# Patient Record
Sex: Male | Born: 1955 | Race: White | Hispanic: No | Marital: Married | Smoking: Never smoker
Health system: Southern US, Community
[De-identification: ages and names within clinical notes are randomized; demographics above are authoritative.]

## PROBLEM LIST (undated history)

## (undated) DIAGNOSIS — I82609 Acute embolism and thrombosis of unspecified veins of unspecified upper extremity: Secondary | ICD-10-CM

## (undated) DIAGNOSIS — R079 Chest pain, unspecified: Secondary | ICD-10-CM

## (undated) DIAGNOSIS — E349 Endocrine disorder, unspecified: Secondary | ICD-10-CM

## (undated) DIAGNOSIS — N529 Male erectile dysfunction, unspecified: Secondary | ICD-10-CM

## (undated) DIAGNOSIS — E669 Obesity, unspecified: Principal | ICD-10-CM

## (undated) DIAGNOSIS — D6859 Other primary thrombophilia: Secondary | ICD-10-CM

## (undated) DIAGNOSIS — Z9889 Other specified postprocedural states: Secondary | ICD-10-CM

## (undated) DIAGNOSIS — M545 Low back pain, unspecified: Secondary | ICD-10-CM

## (undated) DIAGNOSIS — G4733 Obstructive sleep apnea (adult) (pediatric): Secondary | ICD-10-CM

## (undated) DIAGNOSIS — E1169 Type 2 diabetes mellitus with other specified complication: Secondary | ICD-10-CM

## (undated) DIAGNOSIS — I2699 Other pulmonary embolism without acute cor pulmonale: Secondary | ICD-10-CM

## (undated) DIAGNOSIS — K219 Gastro-esophageal reflux disease without esophagitis: Secondary | ICD-10-CM

## (undated) HISTORY — DX: Other primary thrombophilia: D68.59

## (undated) HISTORY — DX: Other specified postprocedural states: Z98.890

## (undated) HISTORY — PX: OTHER SURGICAL HISTORY: SHX169

## (undated) HISTORY — PX: ESOPHAGOGASTRODUODENOSCOPY: SHX1529

## (undated) HISTORY — DX: Gastro-esophageal reflux disease without esophagitis: K21.9

## (undated) HISTORY — DX: Low back pain, unspecified: M54.50

## (undated) HISTORY — DX: Endocrine disorder, unspecified: E34.9

## (undated) HISTORY — PX: KNEE SURGERY: SHX244

## (undated) HISTORY — DX: Acute embolism and thrombosis of unspecified veins of unspecified upper extremity: I82.609

## (undated) HISTORY — DX: Male erectile dysfunction, unspecified: N52.9

## (undated) HISTORY — DX: Type 2 diabetes mellitus with other specified complication: E11.69

## (undated) HISTORY — DX: Low back pain: M54.5

## (undated) HISTORY — PX: APPENDECTOMY: SHX54

## (undated) HISTORY — DX: Other pulmonary embolism without acute cor pulmonale: I26.99

## (undated) HISTORY — PX: SHOULDER SURGERY: SHX246

## (undated) HISTORY — DX: Obesity, unspecified: E66.9

## (undated) HISTORY — DX: Obstructive sleep apnea (adult) (pediatric): G47.33

---

## 2004-01-17 ENCOUNTER — Encounter: Admission: RE | Admit: 2004-01-17 | Discharge: 2004-01-17 | Payer: Self-pay | Admitting: Orthopedic Surgery

## 2004-07-31 ENCOUNTER — Encounter: Admission: RE | Admit: 2004-07-31 | Discharge: 2004-07-31 | Payer: Self-pay | Admitting: Orthopedic Surgery

## 2004-08-17 ENCOUNTER — Ambulatory Visit (HOSPITAL_COMMUNITY): Admission: RE | Admit: 2004-08-17 | Discharge: 2004-08-18 | Payer: Self-pay | Admitting: Orthopedic Surgery

## 2004-11-23 ENCOUNTER — Encounter (INDEPENDENT_AMBULATORY_CARE_PROVIDER_SITE_OTHER): Payer: Self-pay | Admitting: *Deleted

## 2004-11-23 ENCOUNTER — Ambulatory Visit (HOSPITAL_COMMUNITY): Admission: RE | Admit: 2004-11-23 | Discharge: 2004-11-23 | Payer: Self-pay | Admitting: Gastroenterology

## 2005-02-26 ENCOUNTER — Encounter: Admission: RE | Admit: 2005-02-26 | Discharge: 2005-02-26 | Payer: Self-pay

## 2008-01-06 ENCOUNTER — Encounter: Admission: RE | Admit: 2008-01-06 | Discharge: 2008-01-06 | Payer: Self-pay | Admitting: Sports Medicine

## 2008-01-21 ENCOUNTER — Encounter: Admission: RE | Admit: 2008-01-21 | Discharge: 2008-01-21 | Payer: Self-pay | Admitting: Sports Medicine

## 2008-02-04 ENCOUNTER — Encounter: Admission: RE | Admit: 2008-02-04 | Discharge: 2008-02-04 | Payer: Self-pay | Admitting: Sports Medicine

## 2008-05-21 ENCOUNTER — Encounter: Payer: Self-pay | Admitting: Pulmonary Disease

## 2008-06-09 ENCOUNTER — Encounter: Payer: Self-pay | Admitting: Pulmonary Disease

## 2008-09-25 ENCOUNTER — Encounter: Admission: RE | Admit: 2008-09-25 | Discharge: 2008-09-25 | Payer: Self-pay | Admitting: Sports Medicine

## 2008-10-13 ENCOUNTER — Encounter: Admission: RE | Admit: 2008-10-13 | Discharge: 2008-10-13 | Payer: Self-pay | Admitting: Sports Medicine

## 2009-01-06 ENCOUNTER — Encounter: Admission: RE | Admit: 2009-01-06 | Discharge: 2009-01-06 | Payer: Self-pay | Admitting: Sports Medicine

## 2009-03-30 ENCOUNTER — Encounter: Admission: RE | Admit: 2009-03-30 | Discharge: 2009-03-30 | Payer: Self-pay | Admitting: Sports Medicine

## 2009-05-14 ENCOUNTER — Encounter: Admission: RE | Admit: 2009-05-14 | Discharge: 2009-05-14 | Payer: Self-pay | Admitting: Sports Medicine

## 2009-06-18 ENCOUNTER — Encounter: Admission: RE | Admit: 2009-06-18 | Discharge: 2009-06-18 | Payer: Self-pay | Admitting: Sports Medicine

## 2009-08-19 HISTORY — PX: COLONOSCOPY: SHX174

## 2009-08-19 LAB — HM COLONOSCOPY: HM Colonoscopy: NORMAL

## 2009-10-13 ENCOUNTER — Encounter: Admission: RE | Admit: 2009-10-13 | Discharge: 2009-10-13 | Payer: Self-pay | Admitting: Sports Medicine

## 2009-11-23 ENCOUNTER — Encounter: Admission: RE | Admit: 2009-11-23 | Discharge: 2009-11-23 | Payer: Self-pay | Admitting: Sports Medicine

## 2010-02-11 ENCOUNTER — Encounter: Admission: RE | Admit: 2010-02-11 | Discharge: 2010-02-11 | Payer: Self-pay | Admitting: Family Medicine

## 2010-02-22 DIAGNOSIS — I2699 Other pulmonary embolism without acute cor pulmonale: Secondary | ICD-10-CM

## 2010-02-22 HISTORY — DX: Other pulmonary embolism without acute cor pulmonale: I26.99

## 2010-02-23 ENCOUNTER — Ambulatory Visit: Payer: Self-pay | Admitting: Family Medicine

## 2010-02-23 DIAGNOSIS — E291 Testicular hypofunction: Secondary | ICD-10-CM | POA: Insufficient documentation

## 2010-02-23 DIAGNOSIS — N529 Male erectile dysfunction, unspecified: Secondary | ICD-10-CM | POA: Insufficient documentation

## 2010-02-23 DIAGNOSIS — K219 Gastro-esophageal reflux disease without esophagitis: Secondary | ICD-10-CM | POA: Insufficient documentation

## 2010-02-23 DIAGNOSIS — G4730 Sleep apnea, unspecified: Secondary | ICD-10-CM | POA: Insufficient documentation

## 2010-02-23 DIAGNOSIS — S300XXA Contusion of lower back and pelvis, initial encounter: Secondary | ICD-10-CM | POA: Insufficient documentation

## 2010-03-21 ENCOUNTER — Encounter: Admission: RE | Admit: 2010-03-21 | Discharge: 2010-03-21 | Payer: Self-pay | Admitting: Sports Medicine

## 2010-03-23 ENCOUNTER — Encounter: Admission: RE | Admit: 2010-03-23 | Discharge: 2010-03-23 | Payer: Self-pay | Admitting: Neurosurgery

## 2010-03-23 ENCOUNTER — Encounter: Payer: Self-pay | Admitting: Family Medicine

## 2010-04-26 ENCOUNTER — Encounter: Payer: Self-pay | Admitting: Family Medicine

## 2010-05-10 ENCOUNTER — Ambulatory Visit: Payer: Self-pay | Admitting: Family Medicine

## 2010-05-10 DIAGNOSIS — M545 Low back pain, unspecified: Secondary | ICD-10-CM | POA: Insufficient documentation

## 2010-05-10 DIAGNOSIS — R609 Edema, unspecified: Secondary | ICD-10-CM | POA: Insufficient documentation

## 2010-05-11 ENCOUNTER — Encounter: Payer: Self-pay | Admitting: Family Medicine

## 2010-05-12 LAB — CONVERTED CEMR LAB
ALT: 27 units/L (ref 0–53)
AST: 35 units/L (ref 0–37)
Albumin: 3.9 g/dL (ref 3.5–5.2)
Alkaline Phosphatase: 68 units/L (ref 39–117)
BUN: 13 mg/dL (ref 6–23)
Basophils Absolute: 0.1 10*3/uL (ref 0.0–0.1)
Basophils Relative: 1 % (ref 0–1)
Bilirubin, Direct: 0.1 mg/dL (ref 0.0–0.3)
CO2: 26 meq/L (ref 19–32)
Calcium: 9.1 mg/dL (ref 8.4–10.5)
Chloride: 109 meq/L (ref 96–112)
Creatinine, Ser: 1 mg/dL (ref 0.4–1.5)
Eosinophils Absolute: 0.4 10*3/uL (ref 0.0–0.7)
Eosinophils Relative: 5 % (ref 0–5)
GFR calc non Af Amer: 84.54 mL/min (ref >60)
Glucose, Bld: 77 mg/dL (ref 70–99)
HCT: 50 % (ref 39.0–52.0)
Hemoglobin: 16.4 g/dL (ref 13.0–17.0)
Lymphocytes Relative: 23 % (ref 12–46)
Lymphs Abs: 1.5 10*3/uL (ref 0.7–4.0)
MCHC: 32.8 g/dL (ref 30.0–36.0)
MCV: 95.6 fL (ref 78.0–100.0)
Monocytes Absolute: 0.7 10*3/uL (ref 0.1–1.0)
Monocytes Relative: 11 % (ref 3–12)
Neutro Abs: 3.9 10*3/uL (ref 1.7–7.7)
Neutrophils Relative %: 60 % (ref 43–77)
Platelets: 247 10*3/uL (ref 150–400)
Potassium: 4.3 meq/L (ref 3.5–5.1)
RBC: 5.23 M/uL (ref 4.22–5.81)
RDW: 13.5 % (ref 11.5–15.5)
Sodium: 142 meq/L (ref 135–145)
TSH: 1.47 microintl units/mL (ref 0.35–5.50)
Total Bilirubin: 0.8 mg/dL (ref 0.3–1.2)
Total Protein: 6.9 g/dL (ref 6.0–8.3)
WBC: 6.6 10*3/uL (ref 4.0–10.5)

## 2010-05-13 ENCOUNTER — Telehealth: Payer: Self-pay | Admitting: Family Medicine

## 2010-05-28 ENCOUNTER — Encounter: Admission: RE | Admit: 2010-05-28 | Discharge: 2010-05-28 | Payer: Self-pay | Admitting: Sports Medicine

## 2010-06-10 ENCOUNTER — Ambulatory Visit: Payer: Self-pay | Admitting: Family Medicine

## 2010-06-10 LAB — CONVERTED CEMR LAB
Bilirubin Urine: NEGATIVE
Blood in Urine, dipstick: NEGATIVE
Glucose, Urine, Semiquant: NEGATIVE
Ketones, urine, test strip: NEGATIVE
Nitrite: NEGATIVE
Protein, U semiquant: NEGATIVE
Specific Gravity, Urine: <1.005
Urobilinogen, UA: 0.2
WBC Urine, dipstick: NEGATIVE
pH: 5

## 2010-06-11 LAB — CONVERTED CEMR LAB
ALT: 28 units/L (ref 0–53)
AST: 21 units/L (ref 0–37)
Albumin: 4.1 g/dL (ref 3.5–5.2)
Alkaline Phosphatase: 66 units/L (ref 39–117)
BUN: 20 mg/dL (ref 6–23)
Basophils Absolute: 0 10*3/uL (ref 0.0–0.1)
Basophils Relative: 0.3 % (ref 0.0–3.0)
Bilirubin, Direct: 0.2 mg/dL (ref 0.0–0.3)
CO2: 32 meq/L (ref 19–32)
Calcium: 9.1 mg/dL (ref 8.4–10.5)
Chloride: 105 meq/L (ref 96–112)
Cholesterol: 216 mg/dL — ABNORMAL HIGH (ref 0–200)
Creatinine, Ser: 1 mg/dL (ref 0.4–1.5)
Direct LDL: 174.9 mg/dL
Eosinophils Absolute: 0 10*3/uL (ref 0.0–0.7)
Eosinophils Relative: 0.3 % (ref 0.0–5.0)
GFR calc non Af Amer: 87.57 mL/min (ref >60)
Glucose, Bld: 97 mg/dL (ref 70–99)
HCT: 50.9 % (ref 39.0–52.0)
HDL: 38.2 mg/dL — ABNORMAL LOW (ref >39.00)
Hemoglobin: 17.1 g/dL — ABNORMAL HIGH (ref 13.0–17.0)
Lymphocytes Relative: 13.6 % (ref 12.0–46.0)
Lymphs Abs: 1.3 10*3/uL (ref 0.7–4.0)
MCHC: 33.5 g/dL (ref 30.0–36.0)
MCV: 97.1 fL (ref 78.0–100.0)
Monocytes Absolute: 0.7 10*3/uL (ref 0.1–1.0)
Monocytes Relative: 7.8 % (ref 3.0–12.0)
Neutro Abs: 7.4 10*3/uL (ref 1.4–7.7)
Neutrophils Relative %: 78 % — ABNORMAL HIGH (ref 43.0–77.0)
PSA: 1.19 ng/mL (ref 0.10–4.00)
Platelets: 259 10*3/uL (ref 150.0–400.0)
Potassium: 4.1 meq/L (ref 3.5–5.1)
RBC: 5.24 M/uL (ref 4.22–5.81)
RDW: 14.2 % (ref 11.5–14.6)
Sodium: 144 meq/L (ref 135–145)
TSH: 1.53 microintl units/mL (ref 0.35–5.50)
Total Bilirubin: 1 mg/dL (ref 0.3–1.2)
Total CHOL/HDL Ratio: 6
Total Protein: 7.4 g/dL (ref 6.0–8.3)
Triglycerides: 78 mg/dL (ref 0.0–149.0)
VLDL: 15.6 mg/dL (ref 0.0–40.0)
WBC: 9.5 10*3/uL (ref 4.5–10.5)

## 2010-06-16 ENCOUNTER — Ambulatory Visit: Payer: Self-pay | Admitting: Family Medicine

## 2010-08-20 ENCOUNTER — Ambulatory Visit
Admission: RE | Admit: 2010-08-20 | Discharge: 2010-08-20 | Payer: Self-pay | Source: Home / Self Care | Admitting: Orthopedic Surgery

## 2010-08-20 HISTORY — PX: ROTATOR CUFF REPAIR: SHX139

## 2010-08-25 ENCOUNTER — Emergency Department (HOSPITAL_COMMUNITY): Admission: EM | Admit: 2010-08-25 | Discharge: 2010-08-25 | Payer: Self-pay | Admitting: Emergency Medicine

## 2010-08-30 ENCOUNTER — Ambulatory Visit: Payer: Self-pay | Admitting: Family Medicine

## 2010-08-30 DIAGNOSIS — R066 Hiccough: Secondary | ICD-10-CM | POA: Insufficient documentation

## 2010-08-30 DIAGNOSIS — J209 Acute bronchitis, unspecified: Secondary | ICD-10-CM | POA: Insufficient documentation

## 2010-08-31 ENCOUNTER — Ambulatory Visit: Payer: Self-pay | Admitting: Family Medicine

## 2010-08-31 DIAGNOSIS — F411 Generalized anxiety disorder: Secondary | ICD-10-CM | POA: Insufficient documentation

## 2010-09-01 ENCOUNTER — Telehealth: Payer: Self-pay | Admitting: Family Medicine

## 2010-09-02 ENCOUNTER — Ambulatory Visit: Payer: Self-pay | Admitting: Cardiology

## 2010-09-02 ENCOUNTER — Telehealth: Payer: Self-pay | Admitting: Family Medicine

## 2010-09-02 ENCOUNTER — Ambulatory Visit: Payer: Self-pay | Admitting: Internal Medicine

## 2010-09-02 ENCOUNTER — Inpatient Hospital Stay (HOSPITAL_COMMUNITY): Admission: EM | Admit: 2010-09-02 | Discharge: 2010-09-04 | Payer: Self-pay | Admitting: Emergency Medicine

## 2010-09-02 DIAGNOSIS — R0602 Shortness of breath: Secondary | ICD-10-CM | POA: Insufficient documentation

## 2010-09-03 ENCOUNTER — Telehealth: Payer: Self-pay | Admitting: Family Medicine

## 2010-09-03 ENCOUNTER — Ambulatory Visit: Payer: Self-pay | Admitting: Vascular Surgery

## 2010-09-03 ENCOUNTER — Encounter (INDEPENDENT_AMBULATORY_CARE_PROVIDER_SITE_OTHER): Payer: Self-pay | Admitting: Internal Medicine

## 2010-09-06 ENCOUNTER — Ambulatory Visit: Payer: Self-pay | Admitting: Family Medicine

## 2010-09-06 ENCOUNTER — Telehealth: Payer: Self-pay | Admitting: Family Medicine

## 2010-09-06 LAB — CONVERTED CEMR LAB: INR: 1.7

## 2010-09-07 ENCOUNTER — Telehealth: Payer: Self-pay

## 2010-09-07 ENCOUNTER — Ambulatory Visit: Payer: Self-pay | Admitting: Oncology

## 2010-09-10 ENCOUNTER — Ambulatory Visit: Payer: Self-pay | Admitting: Family Medicine

## 2010-09-10 LAB — CONVERTED CEMR LAB: INR: 1.9

## 2010-09-16 ENCOUNTER — Encounter: Payer: Self-pay | Admitting: Family Medicine

## 2010-09-16 ENCOUNTER — Ambulatory Visit: Payer: Self-pay | Admitting: Cardiovascular Disease

## 2010-09-16 LAB — CBC WITH DIFFERENTIAL/PLATELET
BASO%: 0.4 % (ref 0.0–2.0)
Basophils Absolute: 0 10*3/uL (ref 0.0–0.1)
EOS%: 3.1 % (ref 0.0–7.0)
Eosinophils Absolute: 0.2 10*3/uL (ref 0.0–0.5)
HCT: 49 % (ref 38.4–49.9)
HGB: 16.9 g/dL (ref 13.0–17.1)
LYMPH%: 16.7 % (ref 14.0–49.0)
MCH: 32.3 pg (ref 27.2–33.4)
MCHC: 34.5 g/dL (ref 32.0–36.0)
MCV: 93.6 fL (ref 79.3–98.0)
MONO#: 0.7 10*3/uL (ref 0.1–0.9)
MONO%: 10 % (ref 0.0–14.0)
NEUT#: 4.8 10*3/uL (ref 1.5–6.5)
NEUT%: 69.8 % (ref 39.0–75.0)
Platelets: 294 10*3/uL (ref 140–400)
RBC: 5.24 10*6/uL (ref 4.20–5.82)
RDW: 12.7 % (ref 11.0–14.6)
WBC: 6.9 10*3/uL (ref 4.0–10.3)
lymph#: 1.2 10*3/uL (ref 0.9–3.3)

## 2010-09-17 ENCOUNTER — Ambulatory Visit: Payer: Self-pay | Admitting: Family Medicine

## 2010-09-17 LAB — CONVERTED CEMR LAB: INR: 2.2

## 2010-09-20 ENCOUNTER — Telehealth: Payer: Self-pay | Admitting: Family Medicine

## 2010-09-21 LAB — HYPERCOAGULABLE PANEL, COMPREHENSIVE
AntiThromb III Func: 67 % — ABNORMAL LOW (ref 76–126)
Anticardiolipin IgA: 10 APL U/mL (ref <22)
Anticardiolipin IgG: 5 GPL U/mL (ref <23)
Anticardiolipin IgM: 1 MPL U/mL (ref <11)
Beta-2 Glyco I IgG: 0 G Units (ref <20)
Beta-2-Glycoprotein I IgA: 6 A Units (ref <20)
Beta-2-Glycoprotein I IgM: 5 M Units (ref <20)
DRVVT 1:1 Mix: 42.2 secs (ref 36.2–44.3)
DRVVT: 86.1 secs — ABNORMAL HIGH (ref 36.2–44.3)
Homocysteine: 13.3 umol/L (ref 4.0–15.4)
Lupus Anticoagulant: NOT DETECTED
PTT Lupus Anticoagulant: 67.4 secs — ABNORMAL HIGH (ref 30.0–45.6)
PTTLA 4:1 Mix: 51.1 secs — ABNORMAL HIGH (ref 30.0–45.6)
PTTLA Confirmation: 3.5 secs (ref <8.0)
Protein C Activity: 35 % — ABNORMAL LOW (ref 75–133)
Protein C, Total: 71 % (ref 70–140)
Protein S Activity: 40 % — ABNORMAL LOW (ref 69–129)
Protein S Ag, Total: 90 % (ref 70–140)

## 2010-09-21 LAB — COMPREHENSIVE METABOLIC PANEL
ALT: 36 U/L (ref 0–53)
AST: 24 U/L (ref 0–37)
Albumin: 4 g/dL (ref 3.5–5.2)
Alkaline Phosphatase: 65 U/L (ref 39–117)
BUN: 13 mg/dL (ref 6–23)
CO2: 22 mEq/L (ref 19–32)
Calcium: 8.4 mg/dL (ref 8.4–10.5)
Chloride: 102 mEq/L (ref 96–112)
Creatinine, Ser: 1.04 mg/dL (ref 0.40–1.50)
Glucose, Bld: 105 mg/dL — ABNORMAL HIGH (ref 70–99)
Potassium: 3.6 mEq/L (ref 3.5–5.3)
Sodium: 138 mEq/L (ref 135–145)
Total Bilirubin: 0.7 mg/dL (ref 0.3–1.2)
Total Protein: 7.1 g/dL (ref 6.0–8.3)

## 2010-09-24 ENCOUNTER — Ambulatory Visit: Payer: Self-pay | Admitting: Family Medicine

## 2010-09-24 LAB — CONVERTED CEMR LAB: INR: 3.6

## 2010-09-26 ENCOUNTER — Encounter: Payer: Self-pay | Admitting: Family Medicine

## 2010-09-26 ENCOUNTER — Encounter: Admission: RE | Admit: 2010-09-26 | Discharge: 2010-09-26 | Payer: Self-pay | Admitting: Sports Medicine

## 2010-09-27 ENCOUNTER — Telehealth: Payer: Self-pay | Admitting: Family Medicine

## 2010-09-29 ENCOUNTER — Encounter: Payer: Self-pay | Admitting: Family Medicine

## 2010-09-29 ENCOUNTER — Telehealth: Payer: Self-pay | Admitting: Family Medicine

## 2010-09-30 ENCOUNTER — Ambulatory Visit: Payer: Self-pay | Admitting: Pulmonary Disease

## 2010-10-06 ENCOUNTER — Telehealth: Payer: Self-pay | Admitting: Family Medicine

## 2010-10-08 ENCOUNTER — Ambulatory Visit: Payer: Self-pay | Admitting: Oncology

## 2010-10-08 ENCOUNTER — Ambulatory Visit: Payer: Self-pay | Admitting: Family Medicine

## 2010-10-08 LAB — CONVERTED CEMR LAB: INR: 4.6

## 2010-10-15 ENCOUNTER — Telehealth: Payer: Self-pay | Admitting: Family Medicine

## 2010-10-22 ENCOUNTER — Ambulatory Visit: Payer: Self-pay | Admitting: Family Medicine

## 2010-10-22 LAB — CONVERTED CEMR LAB: INR: 2.4

## 2010-10-23 ENCOUNTER — Encounter: Payer: Self-pay | Admitting: Family Medicine

## 2010-11-05 ENCOUNTER — Ambulatory Visit: Payer: Self-pay | Admitting: Family Medicine

## 2010-11-05 LAB — CONVERTED CEMR LAB: INR: 3.1

## 2010-11-09 ENCOUNTER — Telehealth: Payer: Self-pay | Admitting: Family Medicine

## 2010-11-15 ENCOUNTER — Telehealth: Payer: Self-pay | Admitting: Family Medicine

## 2010-11-17 ENCOUNTER — Encounter: Payer: Self-pay | Admitting: Family Medicine

## 2010-11-23 ENCOUNTER — Ambulatory Visit: Payer: Self-pay | Admitting: Family Medicine

## 2010-11-23 LAB — CONVERTED CEMR LAB: INR: 2.9

## 2010-12-06 ENCOUNTER — Ambulatory Visit: Payer: Self-pay | Admitting: Family Medicine

## 2010-12-06 LAB — CONVERTED CEMR LAB: INR: 3.8

## 2010-12-28 ENCOUNTER — Telehealth: Payer: Self-pay | Admitting: Family Medicine

## 2010-12-30 ENCOUNTER — Telehealth: Payer: Self-pay | Admitting: Family Medicine

## 2010-12-31 ENCOUNTER — Ambulatory Visit
Admission: RE | Admit: 2010-12-31 | Discharge: 2010-12-31 | Payer: Self-pay | Source: Home / Self Care | Attending: Family Medicine | Admitting: Family Medicine

## 2010-12-31 LAB — CONVERTED CEMR LAB: INR: 3.1

## 2011-01-11 ENCOUNTER — Ambulatory Visit
Admission: RE | Admit: 2011-01-11 | Discharge: 2011-01-11 | Payer: Self-pay | Source: Home / Self Care | Attending: Family Medicine | Admitting: Family Medicine

## 2011-01-13 ENCOUNTER — Telehealth: Payer: Self-pay | Admitting: Family Medicine

## 2011-01-18 ENCOUNTER — Ambulatory Visit
Admission: RE | Admit: 2011-01-18 | Discharge: 2011-01-18 | Payer: Self-pay | Source: Home / Self Care | Attending: Family Medicine | Admitting: Family Medicine

## 2011-01-18 LAB — CONVERTED CEMR LAB: INR: 1.3

## 2011-01-18 NOTE — Letter (Signed)
Summary: Generic Letter  [place] [facility]  [address] [place], [place] [address]   Phone: [phone]  Fax: [phone]    09/29/2010  Gregory Cortez [address] [place], [place]  [address]  To Whom It May Cncern:  [name] would like to return to work full time on Oct 17,2011. Also, he would like to work Thursday and Friday this week  dates Oct 13 and Oct 01 2010 for 2 hrs each day with no restrictions.           Sincerely,   Dr. Shellia Carwin, MD

## 2011-01-18 NOTE — Assessment & Plan Note (Signed)
Summary: pain in calves/swelling in feet and legs/cjr   Vital Signs:  Patient profile:   55 year old male BP sitting:   114 / 80  (left arm) Cuff size:   large  Vitals Entered By: Raechel Ache, RN (May 10, 2010 4:18 PM) CC: C/o legs swelling and discomfort, pins & needles in feet.   History of Present Illness: Here for th e sudden onset of swelling and pain in both legs and feet which started around 3 weeks ago. No swelling in the hands. No difficulty with urinations or BMs. No chest pains or SOB. In early April he saw Dr. Channing Mutters and Dr. Farris Has about pains in the lower back, and an MRI revealed a herniated disc at L4-5. Then about one month ago Dr. Channing Mutters gave him an epidural steroid shot in the lumbar spine. Prior to that he had been taking high doses of Prednisone for several weeks. Now his back feels a little better, but his legs have swollen up.   Allergies (verified): No Known Drug Allergies  Past History:  Past Medical History: obstructive sleep apnea, sees [facility], wears a CPAP at night testosterone deficiency, sees Dr. Jethro Bolus, on shots every 2 weeks ED GERD, sees [facility] Low back pain, sees Dr. Channing Mutters and Dr. Farris Has (herniated disc at L4-5)  Past Surgical History: Arthroscopic surgeries to both knees Rt shoulder surgery upper endoscopy times two, normal except for reflux colonoscopy 08-2009, normal, repeat in 10 yrs ESI to L4-5 per Dr. Trey Sailors in 03-2010  Review of Systems  The patient denies anorexia, fever, weight loss, weight gain, vision loss, decreased hearing, hoarseness, chest pain, syncope, dyspnea on exertion, prolonged cough, headaches, hemoptysis, abdominal pain, melena, hematochezia, severe indigestion/heartburn, hematuria, incontinence, genital sores, muscle weakness, suspicious skin lesions, transient blindness, difficulty walking, depression, unusual weight change, abnormal bleeding, enlarged lymph nodes, angioedema, breast masses, and  testicular masses.    Physical Exam  General:  overweight-appearing.   Neck:  No deformities, masses, or tenderness noted. Lungs:  Normal respiratory effort, chest expands symmetrically. Lungs are clear to auscultation, no crackles or wheezes. Heart:  Normal rate and regular rhythm. S1 and S2 normal without gallop, murmur, click, rub or other extra sounds. Extremities:  4+ pitting edema to both feet and both legs up to the mid thighs, the legs are tender. Not warm and not red. No calf tenderness, no cords, and negative Homans.    Impression & Recommendations:  Problem # 1:  EDEMA LEG (ICD-782.3)  His updated medication list for this problem includes:    Furosemide 40 Mg Tabs (Furosemide) .[name] Kitchen..[name] Kitchen Two times a day  Orders: Venipuncture (30865) TLB-BMP (Basic Metabolic Panel-BMET) (80048-METABOL) TLB-CBC Platelet - w/Differential (85025-CBCD) TLB-Hepatic/Liver Function Pnl (80076-HEPATIC) TLB-TSH (Thyroid Stimulating Hormone) (84443-TSH)  Problem # 2:  LOW BACK PAIN (ICD-724.2)  Complete Medication List: 1)  Cialis 5 Mg Tabs (Tadalafil) .... Once daily 2)  Testosterone Shots  .... Bi-weekly 3)  Omeprazole 20 Mg Cpdr (Omeprazole) .... Once daily 4)  Furosemide 40 Mg Tabs (Furosemide) .... Two times a day  Patient Instructions: 1)  I think this has resulted from fluid accumulation in his body as a result of using large amounts of steroids, both injectable and oral, over the past month. try Lasix and elevating the legs to get thhis down. Check labs. Recheck in one week.  Prescriptions: FUROSEMIDE 40 MG TABS (FUROSEMIDE) two times a day  #60 x 2   Entered and Authorized by:   Jeannett Senior  Marguerita Beards MD   Signed by:   Nelwyn Salisbury MD on 05/10/2010   Method used:   Electronically to        [facility] [phone][facility] (retail)       [address]       [place], [place]  [address]       Ph: [phone] or [phone]       Fax: [phone]   RxID:   [id number]

## 2011-01-18 NOTE — Letter (Signed)
Summary: [facility]  [facility]   Imported By: Maryln Gottron 11/01/2010 15:14:44  _____________________________________________________________________  External Attachment:    Type:   Image     Comment:   External Document

## 2011-01-18 NOTE — Progress Notes (Signed)
Summary: Pt req a new script for [facility]mg  [facility] [address]  Phone Note Call from Patient Call back at Home Phone [phone]   Caller: spouse - Arlene Summary of Call: Pt req refill of Coumedin 5mg . This dosage was changed at last visit. Pls call in new script for 5mg  [facility] [address] Pt currently has the 7.5 mg dose.    Initial call taken by: Lucy Antigua,  November 15, 2010 2:17 PM  Follow-up for Phone Call        pt states Dr Ethelene Hal was suppose to confer Dr Clent Ridges about his condition and has appt with dr Ethelene Hal Nov 23 2010 and he is out pain meds and Dr Ethelene Hal will not give til seen.   Follow-up by: Pura Spice, RN,  November 15, 2010 4:11 PM  Additional Follow-up for Phone Call Additional follow up Details #1::        I wrote for a small supply of Oxycodone. Call in Coumadin 5mg  to take as directed, #60 with 11 rf Additional Follow-up by: Nelwyn Salisbury MD,  November 15, 2010 4:59 PM    Additional Follow-up for Phone Call Additional follow up Details #2::    PT aware.  Follow-up by: Pura Spice, RN,  November 15, 2010 5:01 PM  New/Updated Medications: OXYCODONE-ACETAMINOPHEN 10-325 MG TABS (OXYCODONE-ACETAMINOPHEN) 1 q 6 hours as needed pain COUMADIN 5 MG TABS (WARFARIN SODIUM) take as directed Prescriptions: COUMADIN 5 MG TABS (WARFARIN SODIUM) take as directed  #30 x 11   Entered by:   Pura Spice, RN   Authorized by:   Nelwyn Salisbury MD   Signed by:   Pura Spice, RN on 11/15/2010   Method used:   [facility]        [facility] [phone][facility] (retail)       [address]       [place], [place]  [address]       Ph: [phone] or [phone]       Fax: [phone]   RxID:   [id number] OXYCODONE-ACETAMINOPHEN 10-325 MG TABS (OXYCODONE-ACETAMINOPHEN) 1 q 6 hours as needed pain  #60 x 0   Entered and Authorized by:   Nelwyn Salisbury MD   Signed by:   Nelwyn Salisbury MD on 11/15/2010   Method used:   Print then Give to Patient   RxID:    [id number]

## 2011-01-18 NOTE — Progress Notes (Signed)
Summary: CT  Phone Note Call from Patient   Caller: mother,pearl - [phone] Summary of Call: Uncomfortable.  Hard time breathing.  CT Scan requested.  Back to ER ASAP.   Called.  Sister came on line.  Says he is not short of breath now.  She says she has literature that says very small PE.  The attacks of SOB & hiccoughs comes & goes.  Sending fax.  3 trips to ER already.  sister wants Dr. Carmon Ginsberg to see fax & decide if he needs CT.   Rudy Jew, RN  September 01, 2010 3:51 PM  Initial call taken by: Rudy Jew, RN,  September 01, 2010 3:46 PM  Follow-up for Phone Call        fax in doc box Follow-up by: Heron Sabins,  September 01, 2010 4:03 PM  Additional Follow-up for Phone Call Additional follow up Details #1::        This was downloaded off the Internet. I do not feel he needs a CT scan at this time Additional Follow-up by: Nelwyn Salisbury MD,  September 02, 2010 8:46 AM

## 2011-01-18 NOTE — Assessment & Plan Note (Signed)
Summary: POST HOSP F/U (PE - PULM EMB) // RS   Vital Signs:  Patient profile:   55 year old male Weight:      347 pounds O2 Sat:      93 % Temp:     98.5 degrees F Pulse rate:   105 / minute BP sitting:   142 / 82  (left arm) Cuff size:   large  Vitals Entered By: Pura Spice, RN (September 06, 2010 12:48 PM) CC: post hosp on lovenox for 6 days    History of Present Illness: Here after a hospital stay from 09-02-10 to 09-04-10 for bilateral small PEs and a left arm superficial thrombus. He had been recovering from a left rotator cuff surgery when he developed intractible hiccups. No other symptoms, although the hiccups made him mildy SOB. While in the hospital a hypercoagulability workup was started and is still pending. He was placed on a Lovenox bridge and was started on Coumadin at 7.5 mg a day. No source of the PEs was found, no evidence of DVT in the arms or legs, and his ECHO was normal. Since coming home he has been tired of course, but he has no chest pains or SOB. His chronic low back pain has flared up however, and he has had significant burning pains in the lower back that radiates down both legs. Percocets do not helkp this very much.   Allergies (verified): No Known Drug Allergies  Past History:  Past Medical History: obstructive sleep apnea, sees Dr. Armanda Magic at [facility], wears a CPAP at night testosterone deficiency, sees Dr. Jethro Bolus, on shots every 2 weeks ED GERD, sees [facility] Low back pain, sees Dr. Channing Mutters and Dr. Farris Has (herniated disc at L4-5) PEs, bilateral, Sept. 2011 superficial thrombosis left arm 08-2010  Past Surgical History: Reviewed history from 08/30/2010 and no changes required. Arthroscopic surgeries to both knees Rt shoulder surgery upper endoscopy times two, normal except for reflux colonoscopy 08-2009, normal, repeat in 10 yrs ESI to L4-5 per Dr. Trey Sailors in 03-2010 left rotator cuff repair 08-20-10 per Dr. Wyline Mood  Review of  Systems  The patient denies anorexia, fever, weight loss, weight gain, vision loss, decreased hearing, hoarseness, chest pain, syncope, dyspnea on exertion, peripheral edema, prolonged cough, headaches, hemoptysis, abdominal pain, melena, hematochezia, severe indigestion/heartburn, hematuria, incontinence, genital sores, muscle weakness, suspicious skin lesions, transient blindness, difficulty walking, depression, unusual weight change, abnormal bleeding, enlarged lymph nodes, angioedema, breast masses, and testicular masses.    Physical Exam  General:  overweight-appearing.   Neck:  No deformities, masses, or tenderness noted. Lungs:  Normal respiratory effort, chest expands symmetrically. Lungs are clear to auscultation, no crackles or wheezes. Heart:  Normal rate and regular rhythm. S1 and S2 normal without gallop, murmur, click, rub or other extra sounds.   Impression & Recommendations:  Problem # 1:  PULMONARY EMBOLISM (ICD-415.19)  The following medications were removed from the medication list:    Coumadin 5 Mg Tabs (Warfarin sodium) .[name] Kitchen... Takes 7.5 mg once daily His updated medication list for this problem includes:    Lovenox 80 Mg/0.21ml Soln (Enoxaparin sodium) .[name] Kitchen... 2 vials two times a day    Coumadin 7.5 Mg Tabs (Warfarin sodium) ..... Once daily  Orders: Fingerstick (95621) Protime (30865HQ) Cardiology Referral (Cardiology)  Problem # 2:  COUMADIN THERAPY (ICD-V58.61)  Orders: Fingerstick (46962) Protime (95284XL)  Problem # 3:  LOW BACK PAIN (ICD-724.2)  His updated medication list for this problem includes:  Oxycodone-acetaminophen 10-325 Mg Tabs (Oxycodone-acetaminophen) .[name] Kitchen... 1 q 6 hours as needed pain    Methocarbamol 500 Mg Tabs (Methocarbamol) .[name] Kitchen... Three times a day as needed spasm  Complete Medication List: 1)  Omeprazole 20 Mg Cpdr (Omeprazole) .... Once daily 2)  Furosemide 40 Mg Tabs (Furosemide) .... Once daily 3)  Oxycodone-acetaminophen 10-325  Mg Tabs (Oxycodone-acetaminophen) .[name] Kitchen.. 1 q 6 hours as needed pain 4)  Proair Hfa 108 (90 Base) Mcg/act Aers (Albuterol sulfate) 5)  Methocarbamol 500 Mg Tabs (Methocarbamol) .... Three times a day as needed spasm 6)  Chlorpromazine Hcl 25 Mg Tabs (Chlorpromazine hcl) .... One 4 times a day 7)  Alprazolam 1 Mg Tabs (Alprazolam) .... One 4 times a day 8)  Lovenox 80 Mg/0.54ml Soln (Enoxaparin sodium) .... 2 vials two times a day 9)  Coumadin 7.5 Mg Tabs (Warfarin sodium) .... Once daily 10)  Lyrica 50 Mg Caps (Pregabalin) .... Three times a day  Patient Instructions: 1)  We will continue the current dose of Coumadin and wait for his INR to come up. Stay on Lovenox shots in the meantime. Refilled Percocet, and we will add Lyrica to see if it helps his sciatica pains. We will refer to Cardiology and to Hematology for a hypercoagulability workup. I will see him back for an OV and INR this Friday.  Prescriptions: LYRICA 50 MG CAPS (PREGABALIN) three times a day  #90 x 5   Entered and Authorized by:   Nelwyn Salisbury MD   Signed by:   Nelwyn Salisbury MD on 09/06/2010   Method used:   Print then Give to Patient   RxID:   [id number] OXYCODONE-ACETAMINOPHEN 10-325 MG TABS (OXYCODONE-ACETAMINOPHEN) 1 q 6 hours as needed pain  #60 x 0   Entered and Authorized by:   Nelwyn Salisbury MD   Signed by:   Nelwyn Salisbury MD on 09/06/2010   Method used:   Print then Give to Patient   RxID:   [id number] METHOCARBAMOL 500 MG TABS (METHOCARBAMOL) three times a day as needed spasm  #60 x 5   Entered and Authorized by:   Nelwyn Salisbury MD   Signed by:   Nelwyn Salisbury MD on 09/06/2010   Method used:   Print then Give to Patient   RxID:   [id number] COUMADIN 7.5 MG TABS (WARFARIN SODIUM) once daily  #30 x 5   Entered and Authorized by:   Nelwyn Salisbury MD   Signed by:   Nelwyn Salisbury MD on 09/06/2010   Method used:   Print then Give to Patient   RxID:   [id number] LOVENOX 80 MG/0.8ML SOLN  (ENOXAPARIN SODIUM) 2 vials two times a day  #40 x 0   Entered and Authorized by:   Nelwyn Salisbury MD   Signed by:   Nelwyn Salisbury MD on 09/06/2010   Method used:   Print then Give to Patient   RxID:   [id number]   Laboratory Results   Blood Tests   Date/Time Recieved: September 06, 2010 1:06 PM  Date/Time Reported: September 06, 2010 1:06 PM    INR: 1.7   (Normal Range: 0.88-1.12   Therap INR: 2.0-3.5) Comments: Wynona Canes, CMA  September 06, 2010 1:06 PM       ANTICOAGULATION RECORD       NEW REGIMEN & LAB RESULTS Anticoag. Dx: V58.83,V58.61,415.19 Current INR Goal Range: 2.5-3.5 Current INR: 1.7 Current Coumadin Dose(mg): 7.5mg  qd Regimen: Same Dose  Repeat testing in: 1 week MEDICATIONS OMEPRAZOLE 20 MG CPDR (OMEPRAZOLE) once daily FUROSEMIDE 40 MG TABS (FUROSEMIDE) once daily OXYCODONE-ACETAMINOPHEN 10-325 MG TABS (OXYCODONE-ACETAMINOPHEN) 1 q 6 hours as needed pain PROAIR HFA 108 (90 BASE) MCG/ACT AERS (ALBUTEROL SULFATE)  METHOCARBAMOL 500 MG TABS (METHOCARBAMOL) three times a day as needed spasm CHLORPROMAZINE HCL 25 MG TABS (CHLORPROMAZINE HCL) one 4 times a day ALPRAZOLAM 1 MG TABS (ALPRAZOLAM) one 4 times a day LOVENOX 80 MG/0.8ML SOLN (ENOXAPARIN SODIUM) 2 vials two times a day COUMADIN 7.5 MG TABS (WARFARIN SODIUM) once daily LYRICA 50 MG CAPS (PREGABALIN) three times a day     Appended Document: Orders Update referral hematalogy     Clinical Lists Changes  Orders: Added new Referral order of Hematology Referral (Hematology) - Signed

## 2011-01-18 NOTE — Progress Notes (Signed)
Summary: please return call  Phone Note Call from Patient Call back at Home Phone [phone]   Caller: Patient---live call Summary of Call: need to order 4 more days of shots. wants Almira Coaster to return call to explain. Initial call taken by: Warnell Forester,  September 20, 2010 9:16 AM  Follow-up for Phone Call        coming to see Dr Clent Ridges on Friday requesting more Lovenox? Follow-up by: Pura Spice, RN,  September 20, 2010 12:09 PM  Additional Follow-up for Phone Call Additional follow up Details #1::        call in enough for 4 days, takes it two times a day  Additional Follow-up by: Nelwyn Salisbury MD,  September 20, 2010 1:11 PM    Additional Follow-up for Phone Call Additional follow up Details #2::    done pt notifed.... Follow-up by: Pura Spice, RN,  September 20, 2010 4:34 PM  Prescriptions: Oneita Hurt 80 MG/0.8ML SOLN (ENOXAPARIN SODIUM) 2 vials two times a day  #8 x 1   Entered by:   Pura Spice, RN   Authorized by:   Nelwyn Salisbury MD   Signed by:   Pura Spice, RN on 09/20/2010   Method used:   [facility] to        [facility] [phone][facility] (retail)       [address]       [place], [place]  [address]       Ph: [phone] or [phone]       Fax: [phone]   RxID:   [id number]

## 2011-01-18 NOTE — Medication Information (Signed)
Summary: [facility]  [facility]   Imported By: Sherian Rein 10/26/2010 07:42:25  _____________________________________________________________________  External Attachment:    Type:   Image     Comment:   External Document

## 2011-01-18 NOTE — Assessment & Plan Note (Signed)
Summary: 1 week fup//ccm   Vital Signs:  Patient profile:   55 year old male Weight:      346 pounds O2 Sat:      92 % Temp:     98.5 degrees F Pulse rate:   85 / minute BP sitting:   130 / 82  (left arm) Cuff size:   large  Vitals Entered By: Pura Spice, RN (September 17, 2010 8:53 AM) CC: follow up doing better. INR 2.2 ? about increasing CPAP settings    History of Present Illness: here to follow up on PEs. He is doing well and feels well. He has seen Dr. Sanjuana Kava for a cardiac consultation, and he said Kalai is doing well. He recommended no changes. He has seen Hematology as well and has results pending form lab tests. He feels his CPAP machine needs adjusting, and he would like to see a [facility] specialist for this.   Allergies (verified): No Known Drug Allergies  Past History:  Past Medical History: Reviewed history from 09/06/2010 and no changes required. obstructive sleep apnea, sees Dr. Armanda Magic at [facility], wears a CPAP at night testosterone deficiency, sees Dr. Jethro Bolus, on shots every 2 weeks ED GERD, sees [facility] Low back pain, sees Dr. Channing Mutters and Dr. Farris Has (herniated disc at L4-5) PEs, bilateral, Sept. 2011 superficial thrombosis left arm 08-2010  Past Surgical History: Reviewed history from 09/16/2010 and no changes required. Arthroscopic surgeries to both knees Rt shoulder surgery upper endoscopy times two, normal except for reflux colonoscopy 08-2009, normal, repeat in 10 yrs ESI to L4-5 per Dr. Trey Sailors in 03-2010 left rotator cuff repair 08-20-10 per Dr. Wyline Mood complicated by PE   Review of Systems  The patient denies anorexia, fever, weight loss, weight gain, vision loss, decreased hearing, hoarseness, chest pain, syncope, dyspnea on exertion, peripheral edema, prolonged cough, headaches, hemoptysis, abdominal pain, melena, hematochezia, severe indigestion/heartburn, hematuria, incontinence, genital sores, muscle weakness,  suspicious skin lesions, transient blindness, difficulty walking, depression, unusual weight change, abnormal bleeding, enlarged lymph nodes, angioedema, breast masses, and testicular masses.    Physical Exam  General:  Well-developed,well-nourished,in no acute distress; alert,appropriate and cooperative throughout examination Neck:  No deformities, masses, or tenderness noted. Lungs:  Normal respiratory effort, chest expands symmetrically. Lungs are clear to auscultation, no crackles or wheezes. Heart:  Normal rate and regular rhythm. S1 and S2 normal without gallop, murmur, click, rub or other extra sounds.   Impression & Recommendations:  Problem # 1:  PULMONARY EMBOLISM (ICD-415.19)  His updated medication list for this problem includes:    Lovenox 80 Mg/0.79ml Soln (Enoxaparin sodium) .[name] Kitchen... 2 vials two times a day    Coumadin 5 Mg Tabs (Warfarin sodium) .[name] Kitchen..[name] Kitchen Two and a half a day  Orders: Protime ([id number]) Fingerstick (13086)  Problem # 2:  COUMADIN THERAPY (ICD-V58.61)  Orders: Protime ([id number]) Fingerstick (29528)  Problem # 3:  SLEEP APNEA, OBSTRUCTIVE (ICD-327.23)  Complete Medication List: 1)  Omeprazole 20 Mg Cpdr (Omeprazole) .... Once daily 2)  Furosemide 40 Mg Tabs (Furosemide) .... Once daily 3)  Oxycodone-acetaminophen 10-325 Mg Tabs (Oxycodone-acetaminophen) .[name] Kitchen.. 1 q 6 hours as needed pain 4)  Proair Hfa 108 (90 Base) Mcg/act Aers (Albuterol sulfate) .... As needed 5)  Methocarbamol 500 Mg Tabs (Methocarbamol) .... Three times a day as needed spasm 6)  Zyrtec Allergy 10 Mg Tabs (Cetirizine hcl) .[name] Kitchen.. 1 tab  once daily 7)  Alprazolam 1 Mg Tabs (Alprazolam) .[name] Kitchen.. 1 tab once daily as needed  8)  Lovenox 80 Mg/0.56ml Soln (Enoxaparin sodium) .... 2 vials two times a day 9)  Lyrica 50 Mg Caps (Pregabalin) .... 2 capsules three times a day 10)  Coumadin 5 Mg Tabs (Warfarin sodium) .... Two and a half a day 11)  Cpap Machine  .... At bedtime  Other Orders: Pulmonary  Referral (Pulmonary)  Patient Instructions: 1)  Increase Coumadin to 12.5 mg a day, continue Lovenox, and recheck in one week. Refer to Dr. Shelle Iron for his CPAP.   Laboratory Results   Blood Tests   Date/Time Recieved: September 17, 2010 8:39 AM  Date/Time Reported: September 17, 2010 8:39 AM    INR: 2.2   (Normal Range: 0.88-1.12   Therap INR: 2.0-3.5) Comments: Wynona Canes, CMA  September 17, 2010 8:39 AM       ANTICOAGULATION RECORD PREVIOUS REGIMEN & LAB RESULTS Anticoagulation Diagnosis:  pulmonary embolism on  09/10/2010 Previous INR Goal Range:  2.5-3.5 on  09/10/2010 Previous INR:  1.9 on  09/10/2010 Previous Coumadin Dose(mg):  7.5mg  qd  on  09/10/2010 Previous Regimen:  10mg  qd  on  09/10/2010  NEW REGIMEN & LAB RESULTS Current INR: 2.2 Current Coumadin Dose(mg): 10mg  qd Regimen: 10mg  qd   (no change)       Repeat testing in: By MD MEDICATIONS OMEPRAZOLE 20 MG CPDR (OMEPRAZOLE) once daily FUROSEMIDE 40 MG TABS (FUROSEMIDE) once daily OXYCODONE-ACETAMINOPHEN 10-325 MG TABS (OXYCODONE-ACETAMINOPHEN) 1 q 6 hours as needed pain PROAIR HFA 108 (90 BASE) MCG/ACT AERS (ALBUTEROL SULFATE) as needed METHOCARBAMOL 500 MG TABS (METHOCARBAMOL) three times a day as needed spasm ZYRTEC ALLERGY 10 MG TABS (CETIRIZINE HCL) 1 tab  once daily ALPRAZOLAM 1 MG TABS (ALPRAZOLAM) 1 TAB once daily as needed LOVENOX 80 MG/0.8ML SOLN (ENOXAPARIN SODIUM) 2 vials two times a day LYRICA 50 MG CAPS (PREGABALIN) 2 capsules three times a day COUMADIN 5 MG TABS (WARFARIN SODIUM) two and a half a day * CPAP MACHINE at bedtime   Anticoagulation Visit Questionnaire      Coumadin dose missed/changed:  No      Abnormal Bleeding Symptoms:  No   Any diet changes including alcohol intake, vegetables or greens since the last visit:  No Any illnesses or hospitalizations since the last visit:  No Any signs of clotting since the last visit (including chest discomfort, dizziness, shortness  of breath, arm tingling, slurred speech, swelling or redness in leg):  No

## 2011-01-18 NOTE — Progress Notes (Signed)
Summary: Call-A-Nurse Report  ote]   Date: Time of Call: Faxed To: [facility] Caller: Fax Number: [phone] Facility: n/a Patient: Gregory Cortez, Gregory Cortez DOB: 10-31-1956 Phone: [phone] Provider: Message: Dr. Rosalie Gums is calling with results for CT Scan with contrast ordered by DR. Shellia Carwin. The results are abnormal and were read by Dr.Beth Manson Passey. Regarding Appointment: Appt Date: Appt Time: Unknown Provider: Reason: Details: Outcome: Message Taken by Lavonna Monarch, CSR

## 2011-01-18 NOTE — Assessment & Plan Note (Signed)
Summary: consult for sleep apnea management.   Visit Type:  Initial Consult Copy to:  Clent Ridges Primary Provider/Referring Provider:  Nelwyn Salisbury MD  CC:  sleep consult. Pt states he wants to gets his cpap rechecked bc it has not been done in years. pt states he is beginning to fall asleep during the day.  History of Present Illness: the pt is a 55 y/o male who I have been asked to see for management of osa.  He was diagnosed 3 yrs ago with osa, but the data from his sleep study is not available currently.  He has been wearing cpap compliantly, but is not aware of his current pressure setting.  He uses nasal pillows, and thinks he may have some mouth opening.  He gets 6-8hrs of sleep each night, and feels fairly rested in the am's upon arising.  However, he currently notes more sleep pressure during the day than he has in the past.  He is concerned that his pressure needs to be adjusted.  He notes sleep pressure when inactive at work, and takes a 15-32min nap everyday at lunch.  He can doze easily watching tv or reading.  He denies any issues with driving.  His weight is up about 30 pounds over the last 2 years.   Current Medications (verified): 1)  Omeprazole 20 Mg Cpdr (Omeprazole) .... Once Daily 2)  Furosemide 40 Mg Tabs (Furosemide) .... Once Daily 3)  Oxycodone-Acetaminophen 10-325 Mg Tabs (Oxycodone-Acetaminophen) .[name] Kitchen.. 1 Q 6 Hours As Needed Pain 4)  Proair Hfa 108 (90 Base) Mcg/act Aers (Albuterol Sulfate) .... As Needed 5)  Methocarbamol 500 Mg Tabs (Methocarbamol) .... Three Times A Day As Needed Spasm 6)  Zyrtec Allergy 10 Mg Tabs (Cetirizine Hcl) .[name] Kitchen.. 1 Tab  Once Daily 7)  Alprazolam 1 Mg Tabs (Alprazolam) .[name] Kitchen.. 1 Tab Once Daily As Needed 8)  Lyrica 50 Mg Caps (Pregabalin) .... 3 Capsules Three Times A Day 9)  Coumadin 10 Mg Tabs (Warfarin Sodium) .... Mon,wed,friday 12 Mg Tues, Thur,sat,sun 10)  Cpap Machine .... At Bedtime  Allergies (verified): No Known Drug Allergies  Past  History:  Family History: Last updated: 09/16/2010 Family History Hypertension Family History Lung cancer Mother alive at age 44, no health issues Father alive at age 73, healthy 2 sisters, alive and healthy  No FH of CAD  Social History: Last updated: 09/16/2010 Married, 3 children (all grown) Never Smoked Alcohol use-no Drug use-no Regular exercise-yes Desk job-petroleum dispatcher  Past Medical History: obstructive sleep apnea, sees Dr. Armanda Magic at [facility], wears a CPAP at night testosterone deficiency, sees Dr. Jethro Bolus, on shots every 2 weeks ED GERD, sees [facility] Low back pain, sees Dr. Channing Mutters and Dr. Farris Has (herniated disc at L4-5) PEs, bilateral, Sept. 2011 superficial thrombosis left arm 08-2010  Past Surgical History: Arthroscopic surgeries to both knees Rt and left  shoulder surgery upper endoscopy times two, normal except for reflux colonoscopy 08-2009, normal, repeat in 10 yrs ESI to L4-5 per Dr. Trey Sailors in 03-2010 left rotator cuff repair 08-20-10 per Dr. Wyline Mood complicated by PE   Family History: Reviewed history from 09/16/2010 and no changes required. Family History Hypertension Family History Lung cancer Mother alive at age 15, no health issues Father alive at age 37, healthy 2 sisters, alive and healthy  No FH of CAD  Social History: Reviewed history from 09/16/2010 and no changes required. Married, 3 children (all grown) Never Smoked Alcohol use-no Drug use-no Regular exercise-yes Desk job-petroleum dispatcher  Review of Systems       The patient complains of shortness of breath with activity, shortness of breath at rest, weight change, difficulty swallowing, nasal congestion/difficulty breathing through nose, and hand/feet swelling.  The patient denies productive cough, non-productive cough, coughing up blood, chest pain, irregular heartbeats, acid heartburn, indigestion, loss of appetite, abdominal pain, sore throat, tooth/dental  problems, headaches, sneezing, itching, ear ache, anxiety, depression, rash, change in color of mucus, and fever.    Vital Signs:  Patient profile:   55 year old male Height:      69 inches Weight:      348.38 pounds O2 Sat:      94 % on Room air Temp:     98.3 degrees F oral Pulse rate:   83 / minute BP sitting:   116 / 84  (left arm) Cuff size:   large  Vitals Entered By: Carver Fila (September 30, 2010 3:11 PM)  O2 Flow:  Room air  Physical Exam  General:  obese male in nad Eyes:  PERRLA and EOMI.   Nose:  patent without discharge. Mouth:  elongation of soft palate and uvula Neck:  no jvd, tmg, LN Lungs:  clear to auscultation Heart:  rrr, no mrg Abdomen:  soft and nontender, bs+ Extremities:  pulses intact distally 1+ edema bilat, no cyanosis  Neurologic:  alert and oriented, moves all 4. does not appear sleepy.   Impression & Recommendations:  Problem # 1:  SLEEP APNEA, OBSTRUCTIVE (ICD-327.23) the pt has known osa, but is now having increasing symptoms of daytime sleepiness.  He has gained substantial weight since his last sleep study, and it is likely that his pressure needs have increased.  He also uses nasal pillows, and is probably leaking pressure due to mouth opening.  Will set the pt up on an auto device for the next 2 weeks to re-optimize his pressure, and will also add a chin strap to prevent mouth opening.  I have also encouraged the pt to work aggressively on weight loss.  Medications Added to Medication List This Visit: 1)  Coumadin 10 Mg Tabs (Warfarin sodium) .... Mon,wed,friday 12 mg tues, thur,sat,sun  Other Orders: Consultation Level IV (04540) DME Referral (DME)  Patient Instructions: 1)  will re-optimize your pressure with "automatic machine" for the next 2 weeks, and will let you know the pressure. 2)  will add chin strap to your nasal pillows, but if you keep opening your mouth during sleep, will need to go to full face mask. 3)  work on weight  loss 4)  followup with me in 6mos, but call if not doing well.

## 2011-01-18 NOTE — Assessment & Plan Note (Signed)
Summary: med check/refill/cjr   Vital Signs:  Patient profile:   55 year old male Weight:      335 pounds O2 Sat:      93 % Temp:     98.7 degrees F Pulse rate:   84 / minute BP sitting:   120 / 80  (left arm) Cuff size:   large  Vitals Entered By: Pura Spice, RN (November 23, 2010 3:58 PM) CC: rx refills  ck proti me    History of Present Illness: Here to follow up. He feels fine except for the low back pain and leg pains. he is tolerating this reasonably well with pain meds. He and Dr. Ethelene Hal want to postpone his steroid injections to after he comes off the Coumadin if possible (this could be next March or April). He has lost 8 more lbs.   Allergies: No Known Drug Allergies  Past History:  Past Medical History: Reviewed history from 10/22/2010 and no changes required. obstructive sleep apnea, sees Dr. Marcelyn Bruins,  wears a CPAP at night testosterone deficiency, sees Dr. Jethro Bolus, on shots every 2 weeks ED GERD, sees [facility] Low back pain, sees Dr. Channing Mutters and Dr. Farris Has (herniated disc at L4-5), and Dr. Sheran Luz  PEs, bilateral, Sept. 2011 superficial thrombosis left arm 08-2010 sees Dr. Reatha Harps for Cardiology care   Past Surgical History: Reviewed history from 09/30/2010 and no changes required. Arthroscopic surgeries to both knees Rt and left  shoulder surgery upper endoscopy times two, normal except for reflux colonoscopy 08-2009, normal, repeat in 10 yrs ESI to L4-5 per Dr. Trey Sailors in 03-2010 left rotator cuff repair 08-20-10 per Dr. Wyline Mood complicated by PE   Review of Systems  The patient denies anorexia, fever, weight gain, vision loss, decreased hearing, hoarseness, chest pain, syncope, dyspnea on exertion, peripheral edema, prolonged cough, headaches, hemoptysis, abdominal pain, melena, hematochezia, severe indigestion/heartburn, hematuria, incontinence, genital sores, muscle weakness, suspicious skin lesions, transient blindness, difficulty  walking, depression, unusual weight change, abnormal bleeding, enlarged lymph nodes, angioedema, breast masses, and testicular masses.    Physical Exam  General:  Well-developed,well-nourished,in no acute distress; alert,appropriate and cooperative throughout examination Lungs:  Normal respiratory effort, chest expands symmetrically. Lungs are clear to auscultation, no crackles or wheezes. Heart:  Normal rate and regular rhythm. S1 and S2 normal without gallop, murmur, click, rub or other extra sounds.   Impression & Recommendations:  Problem # 1:  PULMONARY EMBOLISM (ICD-415.19)  His updated medication list for this problem includes:    Coumadin 10 Mg Tabs (Warfarin sodium) .[name] Kitchen... 12.5 mg on mon and fri, 10 mg other days    Coumadin 5 Mg Tabs (Warfarin sodium) .[name] Kitchen... Take as directed  Orders: Protime (62130QM) Fingerstick (57846)  Problem # 2:  COUMADIN THERAPY (ICD-V58.61)  Problem # 3:  LOW BACK PAIN (ICD-724.2)  His updated medication list for this problem includes:    Oxycodone-acetaminophen 10-325 Mg Tabs (Oxycodone-acetaminophen) .[name] Kitchen... 1 q 6 hours as needed pain    Methocarbamol 500 Mg Tabs (Methocarbamol) .[name] Kitchen... Three times a day as needed spasm  Problem # 4:  ANXIETY STATE, UNSPECIFIED (ICD-300.00)  His updated medication list for this problem includes:    Alprazolam 1 Mg Tabs (Alprazolam) .[name] Kitchen... 1 tab once daily as needed  Complete Medication List: 1)  Omeprazole 20 Mg Cpdr (Omeprazole) .... Once daily 2)  Furosemide 40 Mg Tabs (Furosemide) .... Once daily 3)  Oxycodone-acetaminophen 10-325 Mg Tabs (Oxycodone-acetaminophen) .[name] Kitchen.. 1 q 6 hours as  needed pain 4)  Proair Hfa 108 (90 Base) Mcg/act Aers (Albuterol sulfate) .... As needed 5)  Methocarbamol 500 Mg Tabs (Methocarbamol) .... Three times a day as needed spasm 6)  Zyrtec Allergy 10 Mg Tabs (Cetirizine hcl) .[name] Kitchen.. 1 tab  once daily 7)  Alprazolam 1 Mg Tabs (Alprazolam) .[name] Kitchen.. 1 tab once daily as needed 8)  Lyrica 50 Mg  Caps (Pregabalin) .... 3 capsules three times a day 9)  Coumadin 10 Mg Tabs (Warfarin sodium) .[name] Kitchen.. 12.5 mg on mon and fri, 10 mg other days 10)  Cpap Machine  .... At bedtime 11)  Coumadin 5 Mg Tabs (Warfarin sodium) .... Take as directed  Patient Instructions: 1)  Keep Coumadin at the current level.  2)  Please schedule a follow-up appointment in 1 month.  Prescriptions: OXYCODONE-ACETAMINOPHEN 10-325 MG TABS (OXYCODONE-ACETAMINOPHEN) 1 q 6 hours as needed pain  #100 x 0   Entered and Authorized by:   Nelwyn Salisbury MD   Signed by:   Nelwyn Salisbury MD on 11/23/2010   Method used:   Print then Give to Patient   RxID:   [id number]    Orders Added: 1)  Protime [14782NF] 2)  Fingerstick [36416] 3)  Est. Patient Level IV [62130]     ANTICOAGULATION RECORD PREVIOUS REGIMEN & LAB RESULTS Anticoagulation Diagnosis:  pulmonary embolism on  09/10/2010 Previous INR Goal Range:  2.5-3.5 on  09/10/2010 Previous INR:  3.1 on  11/05/2010 Previous Coumadin Dose(mg):  12.5mg  on mon & fri 10mg  on other days on  10/22/2010 Previous Regimen:  Same Dose on  11/05/2010 Previous Coagulation Comments:  OV on  10/08/2010  NEW REGIMEN & LAB RESULTS Current INR: 2.9 Regimen: same  Repeat testing in: 4 weeks  Anticoagulation Visit Questionnaire Coumadin dose missed/changed:  No Abnormal Bleeding Symptoms:  No  Any diet changes including alcohol intake, vegetables or greens since the last visit:  No Any illnesses or hospitalizations since the last visit:  No Any signs of clotting since the last visit (including chest discomfort, dizziness, shortness of breath, arm tingling, slurred speech, swelling or redness in leg):  No  MEDICATIONS OMEPRAZOLE 20 MG CPDR (OMEPRAZOLE) once daily FUROSEMIDE 40 MG TABS (FUROSEMIDE) once daily OXYCODONE-ACETAMINOPHEN 10-325 MG TABS (OXYCODONE-ACETAMINOPHEN) 1 q 6 hours as needed pain PROAIR HFA 108 (90 BASE) MCG/ACT AERS (ALBUTEROL SULFATE) as  needed METHOCARBAMOL 500 MG TABS (METHOCARBAMOL) three times a day as needed spasm ZYRTEC ALLERGY 10 MG TABS (CETIRIZINE HCL) 1 tab  once daily ALPRAZOLAM 1 MG TABS (ALPRAZOLAM) 1 TAB once daily as needed LYRICA 50 MG CAPS (PREGABALIN) 3 capsules three times a day COUMADIN 10 MG TABS (WARFARIN SODIUM) 12.5 mg on Mon and Fri, 10 mg other days * CPAP MACHINE at bedtime COUMADIN 5 MG TABS (WARFARIN SODIUM) take as directed    Laboratory Results   Blood Tests      INR: 2.9   (Normal Range: 0.88-1.12   Therap INR: 2.0-3.5) Comments: Rita Ohara  November 23, 2010 4:15 PM

## 2011-01-18 NOTE — Assessment & Plan Note (Signed)
Summary: ? uri/cjr   Vital Signs:  Patient profile:   55 year old male O2 Sat:      95 % Pulse rate:   115 / minute BP sitting:   130 / 82  (left arm) Cuff size:   large  Vitals Entered By: Pura Spice, RN (August 30, 2010 2:21 PM) CC: cough congestion hiccups. recent left rotator cuff suragery dr Isaac Laud   History of Present Illness: He recently had shoulder surgery, and ever since then he has had frequent hiccups. At one point he had these for 4 straight days, and he went to the ER. Had a normal CXR. He was placed on chlorpromazine 4 times a day, and this has helped a bit. He is very tired and sleepy now. Also he has had a fever with chest congesiton and coughing up brown sputum for the past 3 days.   Allergies (verified): No Known Drug Allergies  Past History:  Past Medical History: Reviewed history from 05/10/2010 and no changes required. obstructive sleep apnea, sees [facility], wears a CPAP at night testosterone deficiency, sees Dr. Jethro Bolus, on shots every 2 weeks ED GERD, sees [facility] Low back pain, sees Dr. Channing Mutters and Dr. Farris Has (herniated disc at L4-5)  Past Surgical History: Arthroscopic surgeries to both knees Rt shoulder surgery upper endoscopy times two, normal except for reflux colonoscopy 08-2009, normal, repeat in 10 yrs ESI to L4-5 per Dr. Trey Sailors in 03-2010 left rotator cuff repair 08-20-10 per Dr. Wyline Mood  Review of Systems  The patient denies anorexia, weight loss, weight gain, vision loss, decreased hearing, hoarseness, chest pain, syncope, dyspnea on exertion, peripheral edema, headaches, hemoptysis, abdominal pain, melena, hematochezia, severe indigestion/heartburn, hematuria, incontinence, genital sores, muscle weakness, suspicious skin lesions, transient blindness, difficulty walking, depression, unusual weight change, abnormal bleeding, enlarged lymph nodes, angioedema, breast masses, and testicular masses.    Physical  Exam  General:  Well-developed,well-nourished,in no acute distress; alert,appropriate and cooperative throughout examination Head:  Normocephalic and atraumatic without obvious abnormalities. No apparent alopecia or balding. Eyes:  No corneal or conjunctival inflammation noted. EOMI. Perrla. Funduscopic exam benign, without hemorrhages, exudates or papilledema. Vision grossly normal. Ears:  External ear exam shows no significant lesions or deformities.  Otoscopic examination reveals clear canals, tympanic membranes are intact bilaterally without bulging, retraction, inflammation or discharge. Hearing is grossly normal bilaterally. Nose:  External nasal examination shows no deformity or inflammation. Nasal mucosa are pink and moist without lesions or exudates. Mouth:  Oral mucosa and oropharynx without lesions or exudates.  Teeth in good repair. Neck:  No deformities, masses, or tenderness noted. Lungs:  Normal respiratory effort, chest expands symmetrically. Lungs are clear to auscultation, no crackles or wheezes. Heart:  Normal rate and regular rhythm. S1 and S2 normal without gallop, murmur, click, rub or other extra sounds.   Impression & Recommendations:  Problem # 1:  HICCUPS (ICD-786.8)  Problem # 2:  ACUTE BRONCHITIS (ICD-466.0)  His updated medication list for this problem includes:    Proair Hfa 108 (90 Base) Mcg/act Aers (Albuterol sulfate)    Zithromax Z-pak 250 Mg Tabs (Azithromycin) .[name] Kitchen... As directed  Complete Medication List: 1)  Testosterone Shots  .... Bi-weekly 2)  Omeprazole 20 Mg Cpdr (Omeprazole) .... Once daily 3)  Furosemide 40 Mg Tabs (Furosemide) .... Once daily 4)  Oxycodone-acetaminophen 10-325 Mg Tabs (Oxycodone-acetaminophen) .... Dr Thurston Hole 5)  Proair Hfa 108 [phone] Base) Mcg/act Aers (Albuterol sulfate) 6)  Methocarbamol 500 Mg Tabs (Methocarbamol) 7)  Zithromax Z-pak 250 Mg Tabs (Azithromycin) .... As directed 8)  Reglan 10 Mg Tabs (Metoclopramide hcl) .[name] Kitchen.. 1  q 6 hours as needed for hiccups  Patient Instructions: 1)  Get on a Zpack. Switch to reglan for the hiccups since this is less sedating.  2)  Please schedule a follow-up appointment as needed .  Prescriptions: REGLAN 10 MG TABS (METOCLOPRAMIDE HCL) 1 q 6 hours as needed for hiccups  #60 x 2   Entered and Authorized by:   Nelwyn Salisbury MD   Signed by:   Nelwyn Salisbury MD on 08/30/2010   Method used:   Print then Give to Patient   RxID:   [id number] ZITHROMAX Z-PAK 250 MG TABS (AZITHROMYCIN) as directed  #1 x 0   Entered and Authorized by:   Nelwyn Salisbury MD   Signed by:   Nelwyn Salisbury MD on 08/30/2010   Method used:   Print then Give to Patient   RxID:   [id number]

## 2011-01-18 NOTE — Assessment & Plan Note (Signed)
Summary: Recheck pt/follow up with Dr. Marin Comment   Vital Signs:  Patient profile:   55 year old male Weight:      345 pounds O2 Sat:      91 % Temp:     98.5 degrees F Pulse rate:   82 / minute BP sitting:   130 / 90 Cuff size:   large  Vitals Entered By: Pura Spice, RN (September 24, 2010 11:46 AM) CC: leg numbness    History of Present Illness: Here to check his INR and to follow up on low back pain. he still cannlot sleep despite using Lyrica and Vicodin because of severe pains. He has an MRI set up for this Sunday. Other than that he feels better.   Allergies: No Known Drug Allergies  Past History:  Past Medical History: Reviewed history from 09/06/2010 and no changes required. obstructive sleep apnea, sees Dr. Armanda Magic at [facility], wears a CPAP at night testosterone deficiency, sees Dr. Jethro Bolus, on shots every 2 weeks ED GERD, sees [facility] Low back pain, sees Dr. Channing Mutters and Dr. Farris Has (herniated disc at L4-5) PEs, bilateral, Sept. 2011 superficial thrombosis left arm 08-2010  Past Surgical History: Reviewed history from 09/16/2010 and no changes required. Arthroscopic surgeries to both knees Rt shoulder surgery upper endoscopy times two, normal except for reflux colonoscopy 08-2009, normal, repeat in 10 yrs ESI to L4-5 per Dr. Trey Sailors in 03-2010 left rotator cuff repair 08-20-10 per Dr. Wyline Mood complicated by PE   Review of Systems  The patient denies anorexia, fever, weight loss, weight gain, vision loss, decreased hearing, hoarseness, chest pain, syncope, dyspnea on exertion, peripheral edema, prolonged cough, headaches, hemoptysis, abdominal pain, melena, hematochezia, severe indigestion/heartburn, hematuria, incontinence, genital sores, muscle weakness, suspicious skin lesions, transient blindness, difficulty walking, depression, unusual weight change, abnormal bleeding, enlarged lymph nodes, angioedema, breast masses, and testicular masses.     Physical Exam  General:  Well-developed,well-nourished,in no acute distress; alert,appropriate and cooperative throughout examination Neck:  No deformities, masses, or tenderness noted. Lungs:  Normal respiratory effort, chest expands symmetrically. Lungs are clear to auscultation, no crackles or wheezes. Heart:  Normal rate and regular rhythm. S1 and S2 normal without gallop, murmur, click, rub or other extra sounds.   Impression & Recommendations:  Problem # 1:  PULMONARY EMBOLISM (ICD-415.19)  The following medications were removed from the medication list:    Lovenox 80 Mg/0.49ml Soln (Enoxaparin sodium) .[name] Kitchen... 2 vials two times a day His updated medication list for this problem includes:    Coumadin 5 Mg Tabs (Warfarin sodium) .[name] Kitchen... As directed  Orders: Fingerstick (16109) Protime ([id number])  Problem # 2:  LOW BACK PAIN (ICD-724.2)  His updated medication list for this problem includes:    Oxycodone-acetaminophen 10-325 Mg Tabs (Oxycodone-acetaminophen) .[name] Kitchen... 1 q 6 hours as needed pain    Methocarbamol 500 Mg Tabs (Methocarbamol) .[name] Kitchen... Three times a day as needed spasm  Complete Medication List: 1)  Omeprazole 20 Mg Cpdr (Omeprazole) .... Once daily 2)  Furosemide 40 Mg Tabs (Furosemide) .... Once daily 3)  Oxycodone-acetaminophen 10-325 Mg Tabs (Oxycodone-acetaminophen) .[name] Kitchen.. 1 q 6 hours as needed pain 4)  Proair Hfa 108 (90 Base) Mcg/act Aers (Albuterol sulfate) .... As needed 5)  Methocarbamol 500 Mg Tabs (Methocarbamol) .... Three times a day as needed spasm 6)  Zyrtec Allergy 10 Mg Tabs (Cetirizine hcl) .[name] Kitchen.. 1 tab  once daily 7)  Alprazolam 1 Mg Tabs (Alprazolam) .[name] Kitchen.. 1 tab once daily as needed  8)  Lyrica 50 Mg Caps (Pregabalin) .... 3 capsules three times a day 9)  Coumadin 5 Mg Tabs (Warfarin sodium) .... As directed 10)  Cpap Machine  .... At bedtime  Patient Instructions: 1)  We await the MRI results. Increase Lyrica to 150mg  three times a day . He can now stop  taking Lovenox shots since he is adequately anticoagulated. we will decrease the Coumadin dose a bit to 12.5 mg 4 days a week and 10mg  3 days a week. recheck an INR in 2 weeks.    ANTICOAGULATION RECORD PREVIOUS REGIMEN & LAB RESULTS Anticoagulation Diagnosis:  pulmonary embolism on  09/10/2010 Previous INR Goal Range:  2.5-3.5 on  09/10/2010 Previous INR:  2.2 on  09/17/2010 Previous Coumadin Dose(mg):  10mg  qd on  09/17/2010 Previous Regimen:  10mg  qd  on  09/10/2010  NEW REGIMEN & LAB RESULTS Current INR: 3.6 Regimen: 10mg  qd   (no change)   Anticoagulation Visit Questionnaire Coumadin dose missed/changed:  No Abnormal Bleeding Symptoms:  No  Any diet changes including alcohol intake, vegetables or greens since the last visit:  No Any illnesses or hospitalizations since the last visit:  No Any signs of clotting since the last visit (including chest discomfort, dizziness, shortness of breath, arm tingling, slurred speech, swelling or redness in leg):  No  MEDICATIONS OMEPRAZOLE 20 MG CPDR (OMEPRAZOLE) once daily FUROSEMIDE 40 MG TABS (FUROSEMIDE) once daily OXYCODONE-ACETAMINOPHEN 10-325 MG TABS (OXYCODONE-ACETAMINOPHEN) 1 q 6 hours as needed pain PROAIR HFA 108 (90 BASE) MCG/ACT AERS (ALBUTEROL SULFATE) as needed METHOCARBAMOL 500 MG TABS (METHOCARBAMOL) three times a day as needed spasm ZYRTEC ALLERGY 10 MG TABS (CETIRIZINE HCL) 1 tab  once daily ALPRAZOLAM 1 MG TABS (ALPRAZOLAM) 1 TAB once daily as needed LYRICA 50 MG CAPS (PREGABALIN) 3 capsules three times a day COUMADIN 5 MG TABS (WARFARIN SODIUM) as directed * CPAP MACHINE at bedtime    Laboratory Results   Blood Tests      INR: 3.6   (Normal Range: 0.88-1.12   Therap INR: 2.0-3.5) Comments: Rita Ohara  September 24, 2010 11:33 AM

## 2011-01-18 NOTE — Progress Notes (Signed)
Summary: cardiology ref  Phone Note Call from Patient Call back at Home Phone [phone]   Caller: Shela Commons [phone] husband's cell Summary of Call: Doctor that saw husband minimizing everything, want a cardiology referral.  Did speak to nurse & she is working on it.  Need your assistance to get done in hospital & before discharge.  Initial call taken by: Rudy Jew, RN,  September 03, 2010 11:23 AM  Follow-up for Phone Call        please call this cell number and ask to speak to [facility] nurse. I can give her a verbal order for a Cardiology consult if needed Follow-up by: Nelwyn Salisbury MD,  September 03, 2010 12:37 PM  Additional Follow-up for Phone Call Additional follow up Details #1::        spoke with [employer] on cell number requesting [facility] cardilogy . [facility] but wants to keep everything within the group. Spoke with Chrissy RN , [facility] nurse and requested cardilology consult. phone number provided if any other questions or if dr needs to speak with dr fry.  Additional Follow-up by: Pura Spice, RN,  September 03, 2010 12:47 PM

## 2011-01-18 NOTE — Assessment & Plan Note (Signed)
Summary: EKG ONLY   Vital Signs:  Patient profile:   55 year old male BP sitting:   140 / 80  (left arm) Cuff size:   large  Vitals Entered By: Pura Spice, RN (August 31, 2010 4:33 PM)  History of Present Illness: Here with his wife for an EKG and for follow up on hiccups. He was seen here yesterday with a normal exam, but today he saw the NP at Dr. Sherene Sires office and they were concerned that a cardiac cause had not been adequately ruled out. He has not had SOB or chest pain, but he continues to have some hiccups. We agreed to work him in today for another evaluation. Today he seems very anxious, and his wife feels that extreme anxiety is a large part of his problem. he has never been treated for this before other than with some Lorazepam on an as needed basis. He tried Reglan, abd this did not help the hiccups as much as Thorazine had.   Allergies: No Known Drug Allergies  Past History:  Past Medical History: obstructive sleep apnea, sees Dr. Armanda Magic at [facility], wears a CPAP at night testosterone deficiency, sees Dr. Jethro Bolus, on shots every 2 weeks ED GERD, sees [facility] pain, sees Dr. Channing Mutters and Dr. Farris Has (herniated disc at L4-5)  Past Surgical History: Reviewed history from 08/30/2010 and no changes required. Arthroscopic surgeries to both knees Rt shoulder surgery upper endoscopy times two, normal except for reflux colonoscopy 08-2009, normal, repeat in 10 yrs ESI to L4-5 per Dr. Trey Sailors in 03-2010 left rotator cuff repair 08-20-10 per Dr. Wyline Mood  Review of Systems  The patient denies anorexia, fever, weight loss, weight gain, vision loss, decreased hearing, hoarseness, chest pain, syncope, dyspnea on exertion, peripheral edema, prolonged cough, headaches, hemoptysis, abdominal pain, melena, hematochezia, severe indigestion/heartburn, hematuria, incontinence, genital sores, muscle weakness, suspicious skin lesions, transient blindness,  difficulty walking, depression, unusual weight change, abnormal bleeding, enlarged lymph nodes, angioedema, breast masses, and testicular masses.    Physical Exam  General:  Well-developed,well-nourished,in no acute distress; alert,appropriate and cooperative throughout examination Neck:  No deformities, masses, or tenderness noted. Lungs:  Normal respiratory effort, chest expands symmetrically. Lungs are clear to auscultation, no crackles or wheezes. Heart:  Normal rate and regular rhythm. S1 and S2 normal without gallop, murmur, click, rub or other extra sounds. EKG normal  Abdomen:  Bowel sounds positive,abdomen soft and non-tender without masses, organomegaly or hernias noted. Psych:  Oriented X3, memory intact for recent and remote, normally interactive, good eye contact, and moderately anxious.     Impression & Recommendations:  Problem # 1:  HICCUPS (ICD-786.8)  Orders: EKG w/ Interpretation (93000)  Problem # 2:  ANXIETY STATE, UNSPECIFIED (ICD-300.00)  His updated medication list for this problem includes:    Alprazolam 1 Mg Tabs (Alprazolam) ..... One 4 times a day  Orders: EKG w/ Interpretation (93000)  Complete Medication List: 1)  Testosterone Shots  .... Bi-weekly 2)  Omeprazole 20 Mg Cpdr (Omeprazole) .... Once daily 3)  Furosemide 40 Mg Tabs (Furosemide) .... Once daily 4)  Oxycodone-acetaminophen 10-325 Mg Tabs (Oxycodone-acetaminophen) .... Dr Thurston Hole 5)  Proair Hfa 108 [phone] Base) Mcg/act Aers (Albuterol sulfate) 6)  Methocarbamol 500 Mg Tabs (Methocarbamol) 7)  Zithromax Z-pak 250 Mg Tabs (Azithromycin) .... As directed 8)  Chlorpromazine Hcl 25 Mg Tabs (Chlorpromazine hcl) .... One 4 times a day 9)  Alprazolam 1 Mg Tabs (Alprazolam) .... One 4 times a  day  Patient Instructions: 1)  We will start on Xanax four times a day. Go back on Thorazine four times a day. Increase Prilosec OTC to two a day. Recheck in one week.  Prescriptions: ALPRAZOLAM 1 MG TABS  (ALPRAZOLAM) one 4 times a day  #120 x 0   Entered and Authorized by:   Nelwyn Salisbury MD   Signed by:   Nelwyn Salisbury MD on 08/31/2010   Method used:   Print then Give to Patient   RxID:   [id number] CHLORPROMAZINE HCL 25 MG TABS (CHLORPROMAZINE HCL) one 4 times a day  #120 x 0   Entered and Authorized by:   Nelwyn Salisbury MD   Signed by:   Nelwyn Salisbury MD on 08/31/2010   Method used:   Print then Give to Patient   RxID:   [id number]

## 2011-01-18 NOTE — Letter (Signed)
Summary: [facility]  [facility]   Imported By: Maryln Gottron 05/21/2010 11:12:54  _____________________________________________________________________  External Attachment:    Type:   Image     Comment:   External Document

## 2011-01-18 NOTE — Letter (Signed)
Summary: [facility]  [facility]   Imported By: Maryln Gottron 09/07/2010 09:16:50  _____________________________________________________________________  External Attachment:    Type:   Image     Comment:   External Document

## 2011-01-18 NOTE — Assessment & Plan Note (Signed)
Summary: np6/CAD/hx of PE/jml   Visit Type:  new pt visit Primary Ryan Ogborn:  Nelwyn Salisbury MD  CC:  Establish with cardiologist .  History of Present Illness: 55 yo WM with history of GERD, OSA here today to establish cardiology care. He was previously followed by Dr. Armanda Magic with [facility]. He has had chest pain in the past and had several stress tests with most recent being 1.5 years ago. He was told it was normal. He recently had a left rotator cuff surgery and developed hiccups. He underwent a CT angiogram and was found to have bilateral PE. Overall he has been doing well. He has had no chest discomfort. His breathing has been labored over the last year. He wears CPAP for sleep apnea. Occasional palpitations. He takes Lasix for LE edema.   Current Medications (verified): 1)  Omeprazole 20 Mg Cpdr (Omeprazole) .... Once Daily 2)  Furosemide 40 Mg Tabs (Furosemide) .... Once Daily 3)  Oxycodone-Acetaminophen 10-325 Mg Tabs (Oxycodone-Acetaminophen) .[name] Kitchen.. 1 Q 6 Hours As Needed Pain 4)  Proair Hfa 108 (90 Base) Mcg/act Aers (Albuterol Sulfate) .... As Needed 5)  Methocarbamol 500 Mg Tabs (Methocarbamol) .... Three Times A Day As Needed Spasm 6)  Zyrtec Allergy 10 Mg Tabs (Cetirizine Hcl) .[name] Kitchen.. 1 Tab  Once Daily 7)  Alprazolam 1 Mg Tabs (Alprazolam) .[name] Kitchen.. 1 Tab Once Daily As Needed 8)  Lovenox 80 Mg/0.70ml Soln (Enoxaparin Sodium) .... 2 Vials Two Times A Day 9)  Lyrica 50 Mg Caps (Pregabalin) .... 2 Capsules Three Times A Day 10)  Coumadin 5 Mg Tabs (Warfarin Sodium) .... Two A Day  Allergies (verified): No Known Drug Allergies  Past History:  Past Medical History: Reviewed history from 09/06/2010 and no changes required. obstructive sleep apnea, sees Dr. Armanda Magic at [facility], wears a CPAP at night testosterone deficiency, sees Dr. Jethro Bolus, on shots every 2 weeks ED GERD, sees [facility], sees Dr. Channing Mutters and Dr. Farris Has (herniated disc at  L4-5) PEs, bilateral, Sept. 2011 superficial thrombosis left arm 08-2010  Past Surgical History: Arthroscopic surgeries to both knees Rt shoulder surgery upper endoscopy times two, normal except for reflux colonoscopy 08-2009, normal, repeat in 10 yrs ESI to L4-5 per Dr. Trey Sailors in 03-2010 left rotator cuff repair 08-20-10 per Dr. Wyline Mood complicated by PE   Family History: Family History Hypertension Family History Lung cancer Mother alive at age 33, no health issues Father alive at age 40, healthy 2 sisters, alive and healthy  No FH of CAD  Social History: Married, 3 children (all grown) Never Smoked Alcohol use-no Drug use-no Regular exercise-yes Desk job-petroleum dispatcher  Review of Systems  The patient denies fatigue, malaise, fever, weight gain/loss, vision loss, decreased hearing, hoarseness, chest pain, palpitations, shortness of breath, prolonged cough, wheezing, sleep apnea, coughing up blood, abdominal pain, blood in stool, nausea, vomiting, diarrhea, heartburn, incontinence, blood in urine, muscle weakness, joint pain, leg swelling, rash, skin lesions, headache, fainting, dizziness, depression, anxiety, enlarged lymph nodes, easy bruising or bleeding, and environmental allergies.    Vital Signs:  Patient profile:   55 year old male Height:      69 inches Weight:      343.12 pounds BMI:     50.85 Pulse rate:   92 / minute Pulse rhythm:   irregular BP sitting:   122 / 80  (right arm) Cuff size:   large  Vitals Entered By: Danielle Rankin, CMA (September 16, 2010 2:27 PM)  Physical Exam  General:  General: Well developed, well nourished, NAD (343 pounds) HEENT: OP clear, mucus membranes moist SKIN: warm, dry Neuro: No focal deficits Musculoskeletal: Muscle strength 5/5 all ext Psychiatric: Mood and affect normal Neck: No JVD, no carotid bruits, no thyromegaly, no lymphadenopathy. Lungs:Clear bilaterally, no wheezes, rhonci, crackles CV: RRR no murmurs,  gallops rubs Abdomen: soft, NT, ND, BS present Extremities:Trace  edema bilateral LE, pulses 1-2+.    EKG  Procedure date:  09/16/2010  Findings:      NSR, rate 92 bpm. LAD.   Echocardiogram  Procedure date:  09/03/2010  Findings:      Left ventricle: The cavity size was normal. Wall thickness was   increased in a pattern of mild LVH. No valvular abnormalities.   Impression & Recommendations:  Problem # 1:  DYSPNEA (ICD-786.05) Most likely related to his size, OSA and PE. Recent echo with normal LV function and mild LVH.   His updated medication list for this problem includes:    Furosemide 40 Mg Tabs (Furosemide) ..... Once daily  Orders: EKG w/ Interpretation (93000)  Problem # 2:  EDEMA LEG (ICD-782.3) Likely dependent edema. Continue Lasix.   Patient Instructions: 1)  Your physician recommends that you schedule a follow-up appointment in 1 year. 2)  Your physician recommends that you continue on your current medications as directed. Please refer to the Current Medication list given to you today.

## 2011-01-18 NOTE — Assessment & Plan Note (Signed)
Summary: follow up on Friday/pt needs to go to lab after ov/cjr   Vital Signs:  Patient profile:   55 year old male Weight:      345 pounds BMI:     51.13 O2 Sat:      95 % Temp:     98.5 degrees F Pulse rate:   94 / minute BP sitting:   120 / 82  (left arm) Cuff size:   large  Vitals Entered By: Pura Spice, RN (September 10, 2010 10:24 AM) CC: felling better states does not lyrica  INR 1.9   History of Present Illness: Here to follow up on PEs and for anticoagulation. He still takes Lovenox shots two times a day and Coumadin. He still has severe pains in the lower back which radiates to the legs. The Lyrica does not help at all. He asks if it would be safe to see a chiropractor at this point.    Allergies (verified): No Known Drug Allergies  Past History:  Past Medical History: Reviewed history from 09/06/2010 and no changes required. obstructive sleep apnea, sees Dr. Armanda Magic at [facility], wears a CPAP at night testosterone deficiency, sees Dr. Jethro Bolus, on shots every 2 weeks ED GERD, sees [facility], sees Dr. Channing Mutters and Dr. Farris Has (herniated disc at L4-5) PEs, bilateral, Sept. 2011 superficial thrombosis left arm 08-2010  Review of Systems  The patient denies anorexia, fever, weight loss, weight gain, vision loss, decreased hearing, hoarseness, chest pain, syncope, dyspnea on exertion, peripheral edema, prolonged cough, headaches, hemoptysis, abdominal pain, melena, hematochezia, severe indigestion/heartburn, hematuria, incontinence, genital sores, muscle weakness, suspicious skin lesions, transient blindness, difficulty walking, depression, unusual weight change, abnormal bleeding, enlarged lymph nodes, angioedema, breast masses, and testicular masses.    Physical Exam  General:  Well-developed,well-nourished,in no acute distress; alert,appropriate and cooperative throughout examination Lungs:  Normal respiratory effort, chest expands  symmetrically. Lungs are clear to auscultation, no crackles or wheezes. Heart:  Normal rate and regular rhythm. S1 and S2 normal without gallop, murmur, click, rub or other extra sounds. Msk:  No deformity or scoliosis noted of thoracic or lumbar spine.     Impression & Recommendations:  Problem # 1:  PULMONARY EMBOLISM (ICD-415.19)  The following medications were removed from the medication list:    Coumadin 7.5 Mg Tabs (Warfarin sodium) ..... Once daily His updated medication list for this problem includes:    Lovenox 80 Mg/0.38ml Soln (Enoxaparin sodium) .[name] Kitchen... 2 vials two times a day    Coumadin 5 Mg Tabs (Warfarin sodium) .[name] Kitchen..[name] Kitchen Two a day  Orders: Fingerstick (16109) Protime (60454UJ)  Problem # 2:  COUMADIN THERAPY (ICD-V58.61)  Problem # 3:  LOW BACK PAIN (ICD-724.2)  His updated medication list for this problem includes:    Oxycodone-acetaminophen 10-325 Mg Tabs (Oxycodone-acetaminophen) .[name] Kitchen... 1 q 6 hours as needed pain    Methocarbamol 500 Mg Tabs (Methocarbamol) .[name] Kitchen... Three times a day as needed spasm  Complete Medication List: 1)  Omeprazole 20 Mg Cpdr (Omeprazole) .... Once daily 2)  Furosemide 40 Mg Tabs (Furosemide) .... Once daily 3)  Oxycodone-acetaminophen 10-325 Mg Tabs (Oxycodone-acetaminophen) .[name] Kitchen.. 1 q 6 hours as needed pain 4)  Proair Hfa 108 (90 Base) Mcg/act Aers (Albuterol sulfate) 5)  Methocarbamol 500 Mg Tabs (Methocarbamol) .... Three times a day as needed spasm 6)  Chlorpromazine Hcl 25 Mg Tabs (Chlorpromazine hcl) .... One 4 times a day 7)  Alprazolam 1 Mg Tabs (Alprazolam) .... One 4 times  a day 8)  Lovenox 80 Mg/0.81ml Soln (Enoxaparin sodium) .... 2 vials two times a day 9)  Lyrica 50 Mg Caps (Pregabalin) .... 2 capsules three times a day 10)  Coumadin 5 Mg Tabs (Warfarin sodium) .... Two a day  Patient Instructions: 1)  Increase Coumadin to 10 mg a day, and recheck an INR in one week. Increase Lyrica to 100 mg three times a day . I cleared him to  see his chiropractor for the back pain.  Prescriptions: COUMADIN 5 MG TABS (WARFARIN SODIUM) as directed  #100 x 11   Entered and Authorized by:   Nelwyn Salisbury MD   Signed by:   Nelwyn Salisbury MD on 09/10/2010   Method used:   Electronically to        [facility] [phone]* (retail)       [address]       [place], [place]  [address]       Ph: [phone] or [phone]       Fax: [phone]   RxID:   [id number]    ANTICOAGULATION RECORD PREVIOUS REGIMEN & LAB RESULTS Anticoagulation Diagnosis:  V58.83,V58.61,415.19 on  09/06/2010 Previous INR Goal Range:  2.5-3.5 on  09/06/2010 Previous INR:  1.7 on  09/06/2010 Previous Coumadin Dose(mg):  7.5mg  qd on  09/06/2010 Previous Regimen:  Same Dose on  09/06/2010  NEW REGIMEN & LAB RESULTS Anticoag. Dx: pulmonary embolism Current INR Goal Range: 2.5-3.5 Current INR: 1.9 Current Coumadin Dose(mg): 7.5mg  qd  Regimen: 10mg  qd   Chanique Duca: Clent Ridges Repeat testing in: 1 week Other Comments: Kern Reap CMA Duncan Dull)  September 10, 2010 10:50 AM    Anticoagulation Visit Questionnaire Coumadin dose missed/changed:  No Abnormal Bleeding Symptoms:  No  Any diet changes including alcohol intake, vegetables or greens since the last visit:  No Any illnesses or hospitalizations since the last visit:  No Any signs of clotting since the last visit (including chest discomfort, dizziness, shortness of breath, arm tingling, slurred speech, swelling or redness in leg):  No  MEDICATIONS OMEPRAZOLE 20 MG CPDR (OMEPRAZOLE) once daily FUROSEMIDE 40 MG TABS (FUROSEMIDE) once daily OXYCODONE-ACETAMINOPHEN 10-325 MG TABS (OXYCODONE-ACETAMINOPHEN) 1 q 6 hours as needed pain PROAIR HFA 108 (90 BASE) MCG/ACT AERS (ALBUTEROL SULFATE)  METHOCARBAMOL 500 MG TABS (METHOCARBAMOL) three times a day as needed spasm CHLORPROMAZINE HCL 25 MG TABS (CHLORPROMAZINE HCL) one 4 times a day ALPRAZOLAM 1 MG TABS (ALPRAZOLAM) one 4 times a day LOVENOX 80 MG/0.8ML SOLN  (ENOXAPARIN SODIUM) 2 vials two times a day LYRICA 50 MG CAPS (PREGABALIN) 2 capsules three times a day COUMADIN 5 MG TABS (WARFARIN SODIUM) two a day

## 2011-01-18 NOTE — Progress Notes (Signed)
Summary: speak to Dr. Clent Ridges  Phone Note Call from Patient   Caller: Dad Call For: Nelwyn Salisbury MD Summary of Call: Father wants to speak to Dr. Clent Ridges about the CT scan which he is insisting pt have ASAP.  He feels he needs this ASAP. Feels it is very serious.  [phone] Initial call taken by: Lynann Beaver CMA,  September 02, 2010 9:36 AM  Follow-up for Phone Call        obviously I cannot talk to the father due to privacy laws. Please contact Efrain to set up a contrasted chest CT today for SOB so we can rule out a PE. He had labs including a normal BUN/creatinine in the ER recently Follow-up by: Nelwyn Salisbury MD,  September 02, 2010 9:53 AM  Additional Follow-up for Phone Call Additional follow up Details #1::        called home number --left mess. called father number and spoke to him refgarding could not speak with him but he said Brett Canales was with him so spoke with Brett Canales and informed him Camelia Eng would call with appt time for CT Additional Follow-up by: Pura Spice, RN,  September 02, 2010 10:35 AM  New Problems: SHORTNESS OF BREATH (ICD-786.05)   New Problems: SHORTNESS OF BREATH (ICD-786.05)

## 2011-01-18 NOTE — Progress Notes (Signed)
Summary: refill lyrica   Phone Note Call from Patient   Caller: Patient Summary of Call: refill lyrica  states on 3 tabs three times a day  [facility]  Initial call taken by: Pura Spice, RN,  October 15, 2010 10:34 AM  Follow-up for Phone Call        call in #[phone] with 5 rf Follow-up by: Nelwyn Salisbury MD,  October 15, 2010 1:37 PM  Additional Follow-up for Phone Call Additional follow up Details #1::        done  Additional Follow-up by: Pura Spice, RN,  October 15, 2010 2:03 PM    Prescriptions: LYRICA 50 MG CAPS (PREGABALIN) 3 capsules three times a day  #270 x 5   Entered by:   Pura Spice, RN   Authorized by:   Nelwyn Salisbury MD   Signed by:   Pura Spice, RN on 10/15/2010   Method used:   Telephoned to ...       [facility] [address] (retail)       [address]       [place], [place]  [address]       Ph: [phone] or [phone]       Fax: [phone]   RxID:   [id number]

## 2011-01-18 NOTE — Progress Notes (Signed)
Summary: Pt req script for Celebrex for pain. [facility]s call in [facility]  Phone Note Call from Patient Call back at Home Phone [phone]   Caller: spouse- Arlene Summary of Call: Pts wife called and is wanting to see if Dr. Clent Ridges would prescribe Celebrex. Pt has back and leg pain. [facility] [address]   Initial call taken by: Lucy Antigua,  October 06, 2010 8:25 AM  Follow-up for Phone Call        unfortunately he cannot take Celebrex with Coumadin Follow-up by: Nelwyn Salisbury MD,  October 06, 2010 1:18 PM  Additional Follow-up for Phone Call Additional follow up Details #1::        Phone Call Completed Additional Follow-up by: Rudy Jew, RN,  October 06, 2010 1:42 PM

## 2011-01-18 NOTE — Progress Notes (Signed)
Summary: refill wants increase in dosage  Phone Note Refill Request Call back at Home Phone [phone] Message from:  Patient---live call  Refills Requested: Medication #1:  OXYCODONE-ACETAMINOPHEN 10-325 MG TABS 1 q 6 hours as needed pain wants increase in dosage...having alot of pain.  Initial call taken by: Warnell Forester,  November 09, 2010 9:06 AM  Follow-up for Phone Call        he needs to ask Dr. Ethelene Hal, since I refered him to Dr. Ethelene Hal to treat his pain  Follow-up by: Nelwyn Salisbury MD,  November 09, 2010 12:49 PM  Additional Follow-up for Phone Call Additional follow up Details #1::        left mess to return call  Additional Follow-up by: Pura Spice, RN,  November 09, 2010 1:56 PM

## 2011-01-18 NOTE — Progress Notes (Signed)
Summary: lab results  Phone Note Call from Patient   Caller: Patient Call For: Nelwyn Salisbury MD Reason for Call: Lab or Test Results Action Taken: Provider Notified Summary of Call: Pt is asking for lab results. [phone] Initial call taken by: Lynann Beaver CMA,  May 13, 2010 1:47 PM  Follow-up for Phone Call        Phone Call Completed Follow-up by: Raechel Ache, RN,  May 14, 2010 9:11 AM

## 2011-01-18 NOTE — Assessment & Plan Note (Signed)
Summary: PT/FOLLOW UP/CB   Vital Signs:  Patient profile:   55 year old male Height:      69 inches (175.26 cm) Weight:      343 pounds (155.91 kg) O2 Sat:      98 % on Room air Temp:     97.8 degrees F (36.56 degrees C) oral Pulse rate:   73 / minute BP sitting:   116 / 76  (left arm) Cuff size:   large  Vitals Entered By: Josph Macho RMA (October 22, 2010 11:10 AM)  O2 Flow:  Room air CC: Follow-up visit/ CF Is Patient Diabetic? No   History of Present Illness: Here to follow up on PEs and low back pain. He is still in a lot of pain in the lower back and the legs, but he is set to see Dr. Ethelene Hal tomorrow morning. He takes 4 Percocets a day, and the Lyrica has helped a lot. He limits how much Lyrica he takes due to sedation side effects. No SOB or chest pains. He is dieting and walking a little, and he has lost a little weight.   Current Medications (verified): 1)  Omeprazole 20 Mg Cpdr (Omeprazole) .... Once Daily 2)  Furosemide 40 Mg Tabs (Furosemide) .... Once Daily 3)  Oxycodone-Acetaminophen 10-325 Mg Tabs (Oxycodone-Acetaminophen) .[name] Kitchen.. 1 Q 6 Hours As Needed Pain 4)  Proair Hfa 108 (90 Base) Mcg/act Aers (Albuterol Sulfate) .... As Needed 5)  Methocarbamol 500 Mg Tabs (Methocarbamol) .... Three Times A Day As Needed Spasm 6)  Zyrtec Allergy 10 Mg Tabs (Cetirizine Hcl) .[name] Kitchen.. 1 Tab  Once Daily 7)  Alprazolam 1 Mg Tabs (Alprazolam) .[name] Kitchen.. 1 Tab Once Daily As Needed 8)  Lyrica 50 Mg Caps (Pregabalin) .... 3 Capsules Three Times A Day 9)  Coumadin 10 Mg Tabs (Warfarin Sodium) .[name] Kitchen.. 12.5 Mg On Mon and Fri, 10 Mg Other Days 10)  Cpap Machine .... At Bedtime  Allergies (verified): No Known Drug Allergies  Past History:  Past Medical History: obstructive sleep apnea, sees Dr. Marcelyn Bruins,  wears a CPAP at night testosterone deficiency, sees Dr. Jethro Bolus, on shots every 2 weeks ED GERD, sees [facility] Low back pain, sees Dr. Channing Mutters and Dr. Farris Has (herniated disc at  L4-5), and Dr. Sheran Luz  PEs, bilateral, Sept. 2011 superficial thrombosis left arm 08-2010 sees Dr. Reatha Harps for Cardiology care   Past Surgical History: Reviewed history from 09/30/2010 and no changes required. Arthroscopic surgeries to both knees Rt and left  shoulder surgery upper endoscopy times two, normal except for reflux colonoscopy 08-2009, normal, repeat in 10 yrs ESI to L4-5 per Dr. Trey Sailors in 03-2010 left rotator cuff repair 08-20-10 per Dr. Wyline Mood complicated by PE   Review of Systems  The patient denies anorexia, fever, weight gain, vision loss, decreased hearing, hoarseness, chest pain, syncope, dyspnea on exertion, peripheral edema, prolonged cough, headaches, hemoptysis, abdominal pain, melena, hematochezia, severe indigestion/heartburn, hematuria, incontinence, genital sores, muscle weakness, suspicious skin lesions, transient blindness, difficulty walking, depression, unusual weight change, abnormal bleeding, enlarged lymph nodes, angioedema, breast masses, and testicular masses.    Physical Exam  General:  overweight-appearing.  in some pain Neck:  No deformities, masses, or tenderness noted. Lungs:  Normal respiratory effort, chest expands symmetrically. Lungs are clear to auscultation, no crackles or wheezes. Heart:  Normal rate and regular rhythm. S1 and S2 normal without gallop, murmur, click, rub or other extra sounds. Msk:  still tender in the lower back, it  is difficult for him to stand up out of a chair    Impression & Recommendations:  Problem # 1:  PULMONARY EMBOLISM (ICD-415.19)  His updated medication list for this problem includes:    Coumadin 10 Mg Tabs (Warfarin sodium) .[name] Kitchen... 12.5 mg on mon and fri, 10 mg other days  Orders: Protime (16109UE) Fingerstick (45409)  Problem # 2:  COUMADIN THERAPY (ICD-V58.61)  Orders: Protime (81191YN) Fingerstick (82956)  Problem # 3:  LOW BACK PAIN (ICD-724.2)  His updated medication list for  this problem includes:    Oxycodone-acetaminophen 10-325 Mg Tabs (Oxycodone-acetaminophen) .[name] Kitchen... 1 q 6 hours as needed pain    Methocarbamol 500 Mg Tabs (Methocarbamol) .[name] Kitchen... Three times a day as needed spasm  Complete Medication List: 1)  Omeprazole 20 Mg Cpdr (Omeprazole) .... Once daily 2)  Furosemide 40 Mg Tabs (Furosemide) .... Once daily 3)  Oxycodone-acetaminophen 10-325 Mg Tabs (Oxycodone-acetaminophen) .[name] Kitchen.. 1 q 6 hours as needed pain 4)  Proair Hfa 108 (90 Base) Mcg/act Aers (Albuterol sulfate) .... As needed 5)  Methocarbamol 500 Mg Tabs (Methocarbamol) .... Three times a day as needed spasm 6)  Zyrtec Allergy 10 Mg Tabs (Cetirizine hcl) .[name] Kitchen.. 1 tab  once daily 7)  Alprazolam 1 Mg Tabs (Alprazolam) .[name] Kitchen.. 1 tab once daily as needed 8)  Lyrica 50 Mg Caps (Pregabalin) .... 3 capsules three times a day 9)  Coumadin 10 Mg Tabs (Warfarin sodium) .[name] Kitchen.. 12.5 mg on mon and fri, 10 mg other days 10)  Cpap Machine  .... At bedtime  Patient Instructions: 1)  His Coumadin is at a good level now, so we will maintain the current regimen. He will see Dr. Ethelene Hal tomorrow, and hopefully they can work on the back pain   Orders Added: 1)  Protime [85610QW] 2)  Fingerstick [36416] 3)  Est. Patient Level IV [21308]   Immunization History:  Influenza Immunization History:    Influenza:  historical (10/08/2010)   Immunization History:  Influenza Immunization History:    Influenza:  Historical (10/08/2010)  Laboratory Results   Blood Tests   Date/Time Recieved: October 22, 2010 11:05 AM  Date/Time Reported: October 22, 2010 11:05 AM    INR: 2.4   (Normal Range: 0.88-1.12   Therap INR: 2.0-3.5) Comments: Wynona Canes, CMA  October 22, 2010 11:05 AM       ANTICOAGULATION RECORD PREVIOUS REGIMEN & LAB RESULTS Anticoagulation Diagnosis:  pulmonary embolism on  09/10/2010 Previous INR Goal Range:  2.5-3.5 on  09/10/2010 Previous INR:  4.6 on  10/08/2010 Previous Coumadin  Dose(mg):  10mg  on m,w,f 12.5mg  on other days on  10/08/2010 Previous Regimen:  10mg  qd  on  09/10/2010 Previous Coagulation Comments:  OV on  10/08/2010  NEW REGIMEN & LAB RESULTS Current INR: 2.4 Current Coumadin Dose(mg): 12.5mg  on mon & fri 10mg  on other days Regimen: 10mg  qd   (no change)  MEDICATIONS OMEPRAZOLE 20 MG CPDR (OMEPRAZOLE) once daily FUROSEMIDE 40 MG TABS (FUROSEMIDE) once daily OXYCODONE-ACETAMINOPHEN 10-325 MG TABS (OXYCODONE-ACETAMINOPHEN) 1 q 6 hours as needed pain PROAIR HFA 108 (90 BASE) MCG/ACT AERS (ALBUTEROL SULFATE) as needed METHOCARBAMOL 500 MG TABS (METHOCARBAMOL) three times a day as needed spasm ZYRTEC ALLERGY 10 MG TABS (CETIRIZINE HCL) 1 tab  once daily ALPRAZOLAM 1 MG TABS (ALPRAZOLAM) 1 TAB once daily as needed LYRICA 50 MG CAPS (PREGABALIN) 3 capsules three times a day COUMADIN 10 MG TABS (WARFARIN SODIUM) 12.5 mg on Mon and Fri, 10 mg other days * CPAP  MACHINE at bedtime

## 2011-01-18 NOTE — Progress Notes (Signed)
Summary: RTC TO [facility]INA  Phone Note Call from Patient Call back at Home Phone [phone]   Caller: Spouse-arlene Call For: Nelwyn Salisbury MD Summary of Call: pt was seen yesterday voice is very raspy and hoarse no pain. Please advise ?side effect of new med Initial call taken by: Heron Sabins,  September 07, 2010 8:58 AM  Follow-up for Phone Call        I doubt it is from a med. Probably a cold or allergies. Let us know if he gets worse Follow-up by: Nelwyn Salisbury MD,  September 07, 2010 9:27 AM  Additional Follow-up for Phone Call Additional follow up Details #1::        states  has sore throat and it hurts to talk denies dysphagia Additional Follow-up by: Pura Spice, RN,  September 07, 2010 10:51 AM    Additional Follow-up for Phone Call Additional follow up Details #2::    sounds like a virus. Drink fluids, use Tylenol and lozenges as needed  Follow-up by: Nelwyn Salisbury MD,  September 07, 2010 11:06 AM  Additional Follow-up for Phone Call Additional follow up Details #3:: Details for Additional Follow-up Action Taken: Informed above and informed not to take ASA Ibpruprofen  or Advil

## 2011-01-18 NOTE — Progress Notes (Signed)
Summary: mri results  referral to dr Ethelene Hal   Phone Note Call from Patient Call back at Home Phone [phone]   Caller: Patient Call For: Nelwyn Salisbury MD Summary of Call: pt has mri test on 09-26-2010 at [facility]  needs results Initial call taken by: Heron Sabins,  September 27, 2010 10:28 AM  Follow-up for Phone Call        not back yet Follow-up by: Nelwyn Salisbury MD,  September 27, 2010 1:40 PM  Additional Follow-up for Phone Call Additional follow up Details #1::        see report details. refer him to Dr. Sheran Luz ASAP for low back pain  Additional Follow-up by: Nelwyn Salisbury MD,  September 28, 2010 8:34 AM    Additional Follow-up for Phone Call Additional follow up Details #2::    pt aware.  Follow-up by: Pura Spice, RN,  September 28, 2010 8:36 AM

## 2011-01-18 NOTE — Assessment & Plan Note (Signed)
Summary: BRAND NEW PT/TO EST/CJR   Vital Signs:  Patient profile:   55 year old male Height:      69 inches Weight:      343 pounds BMI:     50.84 Temp:     98.1 degrees F oral Pulse rate:   73 / minute BP sitting:   122 / 80  (left arm) Cuff size:   large  Vitals Entered By: Alfred Levins, CMA (February 23, 2010 3:17 PM) CC: establish, look at lump on bottom from shot   History of Present Illness: 55 yr old male to establish with [place] after transfering from Dr. Georgina Pillion with the [place] [facility]. His main concern today is a tender lump on the left buttock which has been present for about a week. His wife, who is a Engineer, civil (consulting), has been giving him testosterone shots at home for the past 3 months, and when she gave it one week ago the needle slipped off the syringe, causing her to give the entire bolus just under the skin of the buttock rather than into the muscle. The area quickly swelled up, became warm and red, and became tender. Since then however it has slowly gone back down, and it seems to be almostr resolved at this point. His wife was worried about it and wanted [place] to check it out. otherwise he is doing well.  Preventive Screening-Counseling & Management  Alcohol-Tobacco     Smoking Status: never  Caffeine-Diet-Exercise     Does Patient Exercise: yes      Drug Use:  no.    Current Medications (verified): 1)  Prilosec Otc 20 Mg Tbec (Omeprazole Magnesium) .... One By Mouth Daily 2)  Cialis 5 Mg Tabs (Tadalafil) .... Take 1 Tablet By Mouth Once A Day or As Directed 3)  Testosterone Shots .... Bi-Weekly  Allergies (verified): No Known Drug Allergies  Past History:  Past Medical History: obstructive sleep apnea, sees [facility], wears a CPAP at night testosterone deficiency, sees Dr. Jethro Bolus, on shots every 2 weeks ED GERD, sees [facility]  Past Surgical History: Arthroscopic surgeries to both knees Rt shoulder surgery upper endoscopy times two, normal except for  reflux colonoscopy 08-2009, normal, repeat in 10 yrs  Family History: Reviewed history and no changes required. Family History Hypertension Family History Lung cancer  Social History: Reviewed history and no changes required. Married Never Smoked Alcohol use-no Drug use-no Regular exercise-yes Smoking Status:  never Drug Use:  no Does Patient Exercise:  yes  Review of Systems  The patient denies anorexia, fever, weight loss, weight gain, vision loss, decreased hearing, hoarseness, chest pain, syncope, dyspnea on exertion, peripheral edema, prolonged cough, headaches, hemoptysis, abdominal pain, melena, hematochezia, severe indigestion/heartburn, hematuria, incontinence, genital sores, muscle weakness, suspicious skin lesions, transient blindness, difficulty walking, depression, unusual weight change, abnormal bleeding, enlarged lymph nodes, angioedema, breast masses, and testicular masses.    Physical Exam  General:  overweight-appearing.   Neck:  No deformities, masses, or tenderness noted. Lungs:  Normal respiratory effort, chest expands symmetrically. Lungs are clear to auscultation, no crackles or wheezes. Heart:  Normal rate and regular rhythm. S1 and S2 normal without gallop, murmur, click, rub or other extra sounds. Msk:  there is a slight swelling over the left buttock which is slightly tender, but it is not red or warm   Impression & Recommendations:  Problem # 1:  CONTUSION OF BUTTOCK (ICD-922.32) Assessment New  Problem # 2:  SLEEP APNEA, OBSTRUCTIVE (ICD-327.23) Assessment:  Unchanged  Problem # 3:  HYPOGONADISM (ICD-257.2) Assessment: Unchanged  Problem # 4:  ERECTILE DYSFUNCTION, ORGANIC (ICD-607.84) Assessment: Unchanged  His updated medication list for this problem includes:    Cialis 5 Mg Tabs (Tadalafil) ..... Once daily  Problem # 5:  GERD (ICD-530.81) Assessment: Unchanged  The following medications were removed from the medication list:     Prilosec Otc 20 Mg Tbec (Omeprazole magnesium) ..... One by mouth daily His updated medication list for this problem includes:    Omeprazole 20 Mg Cpdr (Omeprazole) ..... Once daily  Complete Medication List: 1)  Cialis 5 Mg Tabs (Tadalafil) .... Once daily 2)  Testosterone Shots  .... Bi-weekly 3)  Omeprazole 20 Mg Cpdr (Omeprazole) .... Once daily  Patient Instructions: 1)  The area on the buttock is simply a local inflammatory reaction, not an infection. It is resolving nicely. No treatment is needed. He will set up a cpx soon.

## 2011-01-18 NOTE — Letter (Signed)
Summary: [facility]  [facility]   Imported By: Maryln Gottron 09/30/2010 09:45:51  _____________________________________________________________________  External Attachment:    Type:   Image     Comment:   External Document

## 2011-01-18 NOTE — Assessment & Plan Note (Signed)
Summary: 2 wks rov/mm   Vital Signs:  Patient profile:   55 year old male Pulse rate:   86 / minute BP sitting:   120 / 88  (left arm) Cuff size:   large  Vitals Entered By: Pura Spice, RN (October 08, 2010 11:52 AM) CC: 2 wk follow up  tired fatigue  from pain med    History of Present Illness: Here to follow up on PEs and low back pain. he has had to increase his percocet use to 3 or 4 tabs a day to control the back pain. He is still waiting to hear form Dr. Ethelene Hal' office about his appt. with them. Apparently they are still waiting on records to arrive. He has been trying to swim a littel for exercise, and in fact  he has lost a little weight. he feels good other than the back pain.   Allergies (verified): No Known Drug Allergies  Past History:  Past Medical History: Reviewed history from 09/06/2010 and no changes required. obstructive sleep apnea, sees [facility] Armanda [facility] at [facility], wears a CPAP at night testosterone deficiency, sees Dr. Jethro Bolus, on shots every 2 weeks ED GERD, sees [facility] Low back pain, sees Dr. Channing Mutters and Dr. Farris Has (herniated disc at L4-5) PEs, bilateral, Sept. 2011 superficial thrombosis left arm 08-2010  Past Surgical History: Reviewed history from 09/16/2010 and no changes required. Arthroscopic surgeries to both knees Rt shoulder surgery upper endoscopy times two, normal except for reflux colonoscopy 08-2009, normal, repeat in 10 yrs ESI to L4-5 per Dr. Trey Sailors in 03-2010 left rotator cuff repair 08-20-10 per Dr. Wyline Mood complicated by PE   Review of Systems  The patient denies anorexia, fever, weight gain, vision loss, decreased hearing, hoarseness, chest pain, syncope, dyspnea on exertion, peripheral edema, prolonged cough, headaches, hemoptysis, abdominal pain, melena, hematochezia, severe indigestion/heartburn, hematuria, incontinence, genital sores, muscle weakness, suspicious skin lesions, transient blindness, difficulty  walking, depression, unusual weight change, abnormal bleeding, enlarged lymph nodes, angioedema, breast masses, and testicular masses.    Physical Exam  General:  Well-developed,well-nourished,in no acute distress; alert,appropriate and cooperative throughout examination Lungs:  Normal respiratory effort, chest expands symmetrically. Lungs are clear to auscultation, no crackles or wheezes. Heart:  Normal rate and regular rhythm. S1 and S2 normal without gallop, murmur, click, rub or other extra sounds.   Impression & Recommendations:  Problem # 1:  PULMONARY EMBOLISM (ICD-415.19)  His updated medication list for this problem includes:    Coumadin 10 Mg Tabs (Warfarin sodium) .[name] Kitchen... 12.5 mg on mon and fri, 10 mg other days  Orders: Protime (16109UE) Fingerstick (45409)  Problem # 2:  COUMADIN THERAPY (ICD-V58.61)  Orders: Protime ([id number]) Fingerstick (82956)  Problem # 3:  LOW BACK PAIN (ICD-724.2)  His updated medication list for this problem includes:    Oxycodone-acetaminophen 10-325 Mg Tabs (Oxycodone-acetaminophen) .[name] Kitchen... 1 q 6 hours as needed pain    Methocarbamol 500 Mg Tabs (Methocarbamol) .[name] Kitchen... Three times a day as needed spasm  Complete Medication List: 1)  Omeprazole 20 Mg Cpdr (Omeprazole) .... Once daily 2)  Furosemide 40 Mg Tabs (Furosemide) .... Once daily 3)  Oxycodone-acetaminophen 10-325 Mg Tabs (Oxycodone-acetaminophen) .[name] Kitchen.. 1 q 6 hours as needed pain 4)  Proair Hfa 108 (90 Base) Mcg/act Aers (Albuterol sulfate) .... As needed 5)  Methocarbamol 500 Mg Tabs (Methocarbamol) .... Three times a day as needed spasm 6)  Zyrtec Allergy 10 Mg Tabs (Cetirizine hcl) .[name] Kitchen.. 1 tab  once daily 7)  Alprazolam 1 Mg Tabs (Alprazolam) .[name] Kitchen.. 1 tab once daily as needed 8)  Lyrica 50 Mg Caps (Pregabalin) .... 3 capsules three times a day 9)  Coumadin 10 Mg Tabs (Warfarin sodium) .[name] Kitchen.. 12.5 mg on mon and fri, 10 mg other days 10)  Cpap Machine  .... At bedtime  Patient  Instructions: 1)  Adjust Coumadin as above. I hope he gets to see Dr. Ethelene Hal soon. Continue diet and exercise. 2)  Please schedule a follow-up appointment in 2 weeks.  Prescriptions: OXYCODONE-ACETAMINOPHEN 10-325 MG TABS (OXYCODONE-ACETAMINOPHEN) 1 q 6 hours as needed pain  #120 x 0   Entered and Authorized by:   Nelwyn Salisbury MD   Signed by:   Nelwyn Salisbury MD on 10/08/2010   Method used:   Print then Give to Patient   RxID:   [id number]    Orders Added: 1)  Protime [[id number]] 2)  Fingerstick [36416] 3)  Est. Patient Level IV [25366]    Laboratory Results   Blood Tests   Date/Time Recieved: October 08, 2010 11:48 AM  Date/Time Reported: October 08, 2010 11:48 AM    INR: 4.6   (Normal Range: 0.88-1.12   Therap INR: 2.0-3.5) Comments: Wynona Canes, CMA  October 08, 2010 11:48 AM       ANTICOAGULATION RECORD PREVIOUS REGIMEN & LAB RESULTS Anticoagulation Diagnosis:  pulmonary embolism on  09/10/2010 Previous INR Goal Range:  2.5-3.5 on  09/10/2010 Previous INR:  3.6 on  09/24/2010 Previous Coumadin Dose(mg):  10mg  qd on  09/17/2010 Previous Regimen:  10mg  qd  on  09/10/2010  NEW REGIMEN & LAB RESULTS Current INR: 4.6 Current Coumadin Dose(mg): 10mg  on m,w,f 12.5mg  on other days Regimen: 10mg  qd   (no change) Coagulation Comments: OV MEDICATIONS OMEPRAZOLE 20 MG CPDR (OMEPRAZOLE) once daily FUROSEMIDE 40 MG TABS (FUROSEMIDE) once daily OXYCODONE-ACETAMINOPHEN 10-325 MG TABS (OXYCODONE-ACETAMINOPHEN) 1 q 6 hours as needed pain PROAIR HFA 108 (90 BASE) MCG/ACT AERS (ALBUTEROL SULFATE) as needed METHOCARBAMOL 500 MG TABS (METHOCARBAMOL) three times a day as needed spasm ZYRTEC ALLERGY 10 MG TABS (CETIRIZINE HCL) 1 tab  once daily ALPRAZOLAM 1 MG TABS (ALPRAZOLAM) 1 TAB once daily as needed LYRICA 50 MG CAPS (PREGABALIN) 3 capsules three times a day COUMADIN 10 MG TABS (WARFARIN SODIUM) 12.5 mg on Mon and Fri, 10 mg other days * CPAP MACHINE at  bedtime   Anticoagulation Visit Questionnaire      Coumadin dose missed/changed:  No      Abnormal Bleeding Symptoms:  No   Any diet changes including alcohol intake, vegetables or greens since the last visit:  No Any illnesses or hospitalizations since the last visit:  No Any signs of clotting since the last visit (including chest discomfort, dizziness, shortness of breath, arm tingling, slurred speech, swelling or redness in leg):  No

## 2011-01-18 NOTE — Progress Notes (Signed)
Summary: work note  Phone Note Call from Patient Call back at [employer] [phone]   Caller: Patient Call For: Nelwyn Salisbury MD Summary of Call: pt is requesting  full time note to return to work on 10-04-2010. PT would like a work note for [employer] and friday to work 2 hrs each day with no restriction.  Initial call taken by: Heron Sabins,  September 29, 2010 12:11 PM  Follow-up for Phone Call        please take care of this  Follow-up by: Nelwyn Salisbury MD,  September 29, 2010 2:07 PM  Additional Follow-up for Phone Call Additional follow up Details #1::        done pt aware to pick up at front desk.  Additional Follow-up by: Pura Spice, RN,  September 29, 2010 2:41 PM

## 2011-01-18 NOTE — Progress Notes (Signed)
  Phone Note Outgoing Call   Summary of Call: at 6:11 p.m. I received a call from Dr. Manson Passey.  The patient had a chest CT that now shows multiple pulmonary emboli.  I called the radiology facility and talked to the patient and his sister........ who is a Engineer, civil (consulting)........... she will take him directly to [facility]  emergency room now

## 2011-01-18 NOTE — Assessment & Plan Note (Signed)
Summary: CPX//CCM   Vital Signs:  Patient profile:   55 year old male BP sitting:   106 / 70  (left arm) Cuff size:   large  Vitals Entered By: Raechel Ache, RN (June 16, 2010 2:50 PM) CC: CPX, labs done. Recently saw ortho; got shots in shoulder and knee.   History of Present Illness: 55 yr old male for a cpx. He feels good but realizes he is quite overweight. Trying to exercise but admits to a poor diet. At our lastr visit he was having significant edema of the lower legs and feet. Since taking Lasix this has improved tremendously.   Allergies (verified): No Known Drug Allergies  Past History:  Past Medical History: Reviewed history from 05/10/2010 and no changes required. obstructive sleep apnea, sees [facility], wears a CPAP at night testosterone deficiency, sees Dr. Jethro Bolus, on shots every 2 weeks ED GERD, sees [facility] Low back pain, sees Dr. Channing Mutters and Dr. Farris Has (herniated disc at L4-5)  Past Surgical History: Reviewed history from 05/10/2010 and no changes required. Arthroscopic surgeries to both knees Rt shoulder surgery upper endoscopy times two, normal except for reflux colonoscopy 08-2009, normal, repeat in 10 yrs ESI to L4-5 per Dr. Trey Sailors in 03-2010  Family History: Reviewed history from 02/23/2010 and no changes required. Family History Hypertension Family History Lung cancer  Social History: Reviewed history from 02/23/2010 and no changes required. Married Never Smoked Alcohol use-no Drug use-no Regular exercise-yes  Review of Systems  The patient denies anorexia, fever, weight loss, vision loss, decreased hearing, hoarseness, chest pain, syncope, dyspnea on exertion, prolonged cough, headaches, hemoptysis, abdominal pain, melena, hematochezia, severe indigestion/heartburn, hematuria, incontinence, genital sores, muscle weakness, suspicious skin lesions, transient blindness, difficulty walking, depression, unusual weight change,  abnormal bleeding, enlarged lymph nodes, angioedema, breast masses, and testicular masses.    Physical Exam  General:  morbidly obese Head:  Normocephalic and atraumatic without obvious abnormalities. No apparent alopecia or balding. Eyes:  No corneal or conjunctival inflammation noted. EOMI. Perrla. Funduscopic exam benign, without hemorrhages, exudates or papilledema. Vision grossly normal. Ears:  External ear exam shows no significant lesions or deformities.  Otoscopic examination reveals clear canals, tympanic membranes are intact bilaterally without bulging, retraction, inflammation or discharge. Hearing is grossly normal bilaterally. Nose:  External nasal examination shows no deformity or inflammation. Nasal mucosa are pink and moist without lesions or exudates. Mouth:  Oral mucosa and oropharynx without lesions or exudates.  Teeth in good repair. Neck:  No deformities, masses, or tenderness noted. Chest Wall:  No deformities, masses, tenderness or gynecomastia noted. Lungs:  Normal respiratory effort, chest expands symmetrically. Lungs are clear to auscultation, no crackles or wheezes. Heart:  Normal rate and regular rhythm. S1 and S2 normal without gallop, murmur, click, rub or other extra sounds. EKG normal Abdomen:  Bowel sounds positive,abdomen soft and non-tender without masses, organomegaly or hernias noted. Genitalia:  Testes bilaterally descended without nodularity, tenderness or masses. No scrotal masses or lesions. No penis lesions or urethral discharge. Msk:  No deformity or scoliosis noted of thoracic or lumbar spine.   Pulses:  R and L carotid,radial,femoral,dorsalis pedis and posterior tibial pulses are full and equal bilaterally Extremities:  1+ left pedal edema and 1+ right pedal edema.   Neurologic:  No cranial nerve deficits noted. Station and gait are normal. Plantar reflexes are down-going bilaterally. DTRs are symmetrical throughout. Sensory, motor and coordinative  functions appear intact. Skin:  Intact without suspicious lesions or rashes  Cervical Nodes:  No lymphadenopathy noted Axillary Nodes:  No palpable lymphadenopathy Inguinal Nodes:  No significant adenopathy Psych:  Cognition and judgment appear intact. Alert and cooperative with normal attention span and concentration. No apparent delusions, illusions, hallucinations   Impression & Recommendations:  Problem # 1:  HEALTH MAINTENANCE EXAM (ICD-V70.0)  Orders: EKG w/ Interpretation (93000)  Complete Medication List: 1)  Testosterone Shots  .... Bi-weekly 2)  Omeprazole 20 Mg Cpdr (Omeprazole) .... Once daily 3)  Furosemide 40 Mg Tabs (Furosemide) .... Once daily 4)  Mobic 7.5 Mg Tabs (Meloxicam) .... As needed  Patient Instructions: 1)  It is important that you exercise reguarly at least 20 minutes 5 times a week. If you develop chest pain, have severe difficulty breathing, or feel very tired, stop exercising immediately and seek medical attention.  2)  You need to lose weight. Consider a lower calorie diet and regular exercise.  3)  recheck a lipid panel in 6 months 4)  Stay on daily Lasix but reduce the dose to one 40 mg tablet Prescriptions: FUROSEMIDE 40 MG TABS (FUROSEMIDE) once daily  #30 x 11   Entered and Authorized by:   Nelwyn Salisbury MD   Signed by:   Nelwyn Salisbury MD on 06/16/2010   Method used:   [facility]        [phone] ([facility]ail[facility]       [address]       [place], [place]  [address]       Ph: [phone] or [phone]       Fax: [phone]   RxID:   [id number]    Preventive Care Screening  Colonoscopy:    Date:  08/19/2009    Results:  normal

## 2011-01-20 NOTE — Assessment & Plan Note (Signed)
Summary: ?cough up brown mucus    Vital Signs:  Patient profile:   55 year old male O2 Sat:      97 % Temp:     98 degrees F Pulse rate:   87 / minute BP sitting:   130 / 82  (left arm) Cuff size:   large  Vitals Entered By: Pura Spice, RN (January 11, 2011 4:07 PM) CC: cough  brown mucus  adjust protime  refill oxycodone    Contraindications/Deferment of Procedures/Staging:    Test/Procedure: Weight Refused    Reason for deferment: patient declined-cannot calculate BMI   History of Present Illness: Here to follow up on Coumadin and to address some chest congestion he has had for one week. He is coughing up some brown sputum. No SOB or chest pain or fever.   Allergies: No Known Drug Allergies  Past History:  Past Medical History: Reviewed history from 10/22/2010 and no changes required. obstructive sleep apnea, sees Dr. Marcelyn Bruins,  wears a CPAP at night testosterone deficiency, sees Dr. Jethro Bolus, on shots every 2 weeks ED GERD, sees [facility] Low back pain, sees Dr. Channing Mutters and Dr. Farris Has (herniated disc at L4-5), and Dr. Sheran Luz  PEs, bilateral, Sept. 2011 superficial thrombosis left arm 08-2010 sees Dr. Reatha Harps for Cardiology care   Review of Systems  The patient denies anorexia, fever, weight loss, weight gain, vision loss, decreased hearing, hoarseness, chest pain, syncope, dyspnea on exertion, peripheral edema, headaches, hemoptysis, abdominal pain, melena, hematochezia, severe indigestion/heartburn, hematuria, incontinence, genital sores, muscle weakness, suspicious skin lesions, transient blindness, difficulty walking, depression, unusual weight change, abnormal bleeding, enlarged lymph nodes, angioedema, breast masses, and testicular masses.    Physical Exam  General:  Well-developed,well-nourished,in no acute distress; alert,appropriate and cooperative throughout examination Head:  Normocephalic and atraumatic without obvious  abnormalities. No apparent alopecia or balding. Eyes:  No corneal or conjunctival inflammation noted. EOMI. Perrla. Funduscopic exam benign, without hemorrhages, exudates or papilledema. Vision grossly normal. Ears:  External ear exam shows no significant lesions or deformities.  Otoscopic examination reveals clear canals, tympanic membranes are intact bilaterally without bulging, retraction, inflammation or discharge. Hearing is grossly normal bilaterally. Nose:  External nasal examination shows no deformity or inflammation. Nasal mucosa are pink and moist without lesions or exudates. Mouth:  Oral mucosa and oropharynx without lesions or exudates.  Teeth in good repair. Neck:  No deformities, masses, or tenderness noted. Lungs:  Normal respiratory effort, chest expands symmetrically. Lungs are clear to auscultation, no crackles or wheezes.   Impression & Recommendations:  Problem # 1:  ACUTE BRONCHITIS (ICD-466.0)  His updated medication list for this problem includes:    Proair Hfa 108 (90 Base) Mcg/act Aers (Albuterol sulfate) .[name] Kitchen... As needed    Zithromax Z-pak 250 Mg Tabs (Azithromycin) .[name] Kitchen... As directed  Problem # 2:  PULMONARY EMBOLISM (ICD-415.19)  His updated medication list for this problem includes:    Coumadin 10 Mg Tabs (Warfarin sodium) .[name] Kitchen... 12.5 mg on mon and fri, 10 mg other days    Coumadin 5 Mg Tabs (Warfarin sodium) .[name] Kitchen... Take as directed  Problem # 3:  LOW BACK PAIN (ICD-724.2)  His updated medication list for this problem includes:    Oxycodone-acetaminophen 10-325 Mg Tabs (Oxycodone-acetaminophen) .[name] Kitchen... 1 q 4  hours as needed pain    Methocarbamol 500 Mg Tabs (Methocarbamol) .[name] Kitchen... Three times a day as needed spasm  Complete Medication List: 1)  Omeprazole 20 Mg Cpdr (Omeprazole) .... Once daily  2)  Furosemide 40 Mg Tabs (Furosemide) .... Once daily 3)  Oxycodone-acetaminophen 10-325 Mg Tabs (Oxycodone-acetaminophen) .[name] Kitchen.. 1 q 4  hours as needed pain 4)  Proair  Hfa 108 (90 Base) Mcg/act Aers (Albuterol sulfate) .... As needed 5)  Methocarbamol 500 Mg Tabs (Methocarbamol) .... Three times a day as needed spasm 6)  Zyrtec Allergy 10 Mg Tabs (Cetirizine hcl) .[name] Kitchen.. 1 tab  once daily 7)  Alprazolam 1 Mg Tabs (Alprazolam) .... Two times a day as needed anxiety 8)  Lyrica 50 Mg Caps (Pregabalin) .... 3 capsules three times a day 9)  Coumadin 10 Mg Tabs (Warfarin sodium) .[name] Kitchen.. 12.5 mg on mon and fri, 10 mg other days 10)  Cpap Machine  .... At bedtime 11)  Coumadin 5 Mg Tabs (Warfarin sodium) .... Take as directed 12)  Zithromax Z-pak 250 Mg Tabs (Azithromycin) .... As directed  Patient Instructions: 1)  Please schedule a follow-up appointment in 1 month. We are aiming for his last day on Coumadin to be 03-03-11 ( which would be after a 6 month treatment period)  Prescriptions: ZITHROMAX Z-PAK 250 MG TABS (AZITHROMYCIN) as directed  #1 x 0   Entered and Authorized by:   Nelwyn Salisbury MD   Signed by:   Nelwyn Salisbury MD on 01/11/2011   Method used:   Print then Give to Patient   RxID:   [id number] OXYCODONE-ACETAMINOPHEN 10-325 MG TABS (OXYCODONE-ACETAMINOPHEN) 1 q 4  hours as needed pain  #100 x 0   Entered and Authorized by:   Nelwyn Salisbury MD   Signed by:   Nelwyn Salisbury MD on 01/11/2011   Method used:   Print then Give to Patient   RxID:   [id number]    Orders Added: 1)  Est. Patient Level IV [24401]

## 2011-01-20 NOTE — Assessment & Plan Note (Signed)
Summary: leg pain/dm/r/o DVT/dm   Vital Signs:  Patient profile:   55 year old male BP sitting:   120 / 82  (left arm) Cuff size:   large  Vitals Entered By: Pura Spice, RN (December 06, 2010 2:33 PM) CC: swelling legs c/o pain and tenderness left lower leg thinks it could have  been cramp PT today is 3.8   wants rx for coumadin #48 and states taking oxycodone 5 a day    Contraindications/Deferment of Procedures/Staging:    Test/Procedure: Weight Refused    Reason for deferment: patient declined-cannot calculate BMI   History of Present Illness: Here for med refills and to check his left lower leg. Over this past weekend he [place], and he had not done that much walking for months. Yesterday he noticed some swelling and mild pain on the beck of the lower left leg. Today this is better.   Allergies: No Known Drug Allergies  Past History:  Past Medical History: Reviewed history from 10/22/2010 and no changes required. obstructive sleep apnea, sees Dr. Marcelyn Bruins,  wears a CPAP at night testosterone deficiency, sees Dr. Jethro Bolus, on shots every 2 weeks ED GERD, sees [facility] Low back pain, sees Dr. Channing Mutters and Dr. Farris Has (herniated disc at L4-5), and Dr. Sheran Luz  PEs, bilateral, Sept. 2011 superficial thrombosis left arm 08-2010 sees Dr. Reatha Harps for Cardiology care   Review of Systems  The patient denies anorexia, fever, weight loss, weight gain, vision loss, decreased hearing, hoarseness, chest pain, syncope, dyspnea on exertion, peripheral edema, prolonged cough, headaches, hemoptysis, abdominal pain, melena, hematochezia, severe indigestion/heartburn, hematuria, incontinence, genital sores, muscle weakness, suspicious skin lesions, transient blindness, difficulty walking, depression, unusual weight change, abnormal bleeding, enlarged lymph nodes, angioedema, breast masses, and testicular masses.    Physical  Exam  General:  Well-developed,well-nourished,in no acute distress; alert,appropriate and cooperative throughout examination Lungs:  Normal respiratory effort, chest expands symmetrically. Lungs are clear to auscultation, no crackles or wheezes. Heart:  Normal rate and regular rhythm. S1 and S2 normal without gallop, murmur, click, rub or other extra sounds. Extremities:  2+ left pedal edema and 2+ right pedal edema.  He is mildly tender at the superior edge of the left Achilles tendon, no cords felt. Negative Homans.    Impression & Recommendations:  Problem # 1:  PULMONARY EMBOLISM (ICD-415.19)  His updated medication list for this problem includes:    Coumadin 10 Mg Tabs (Warfarin sodium) .[name] Kitchen... 12.5 mg on mon and fri, 10 mg other days    Coumadin 5 Mg Tabs (Warfarin sodium) .[name] Kitchen... Take as directed  Orders: Protime (45409WJ) Fingerstick (19147)  Problem # 2:  COUMADIN THERAPY (ICD-V58.61)  Orders: Protime ([id number]) Fingerstick (30865)  Problem # 3:  LOW BACK PAIN (ICD-724.2)  His updated medication list for this problem includes:    Oxycodone-acetaminophen 10-325 Mg Tabs (Oxycodone-acetaminophen) .[name] Kitchen... 1 q 4  hours as needed pain    Methocarbamol 500 Mg Tabs (Methocarbamol) .[name] Kitchen... Three times a day as needed spasm  Problem # 4:  EDEMA LEG (ICD-782.3)  His updated medication list for this problem includes:    Furosemide 40 Mg Tabs (Furosemide) ..... Once daily  Complete Medication List: 1)  Omeprazole 20 Mg Cpdr (Omeprazole) .... Once daily 2)  Furosemide 40 Mg Tabs (Furosemide) .... Once daily 3)  Oxycodone-acetaminophen 10-325 Mg Tabs (Oxycodone-acetaminophen) .[name] Kitchen.. 1 q 4  hours as needed pain 4)  Proair Hfa 108 (  90 Base) Mcg/act Aers (Albuterol sulfate) .... As needed 5)  Methocarbamol 500 Mg Tabs (Methocarbamol) .... Three times a day as needed spasm 6)  Zyrtec Allergy 10 Mg Tabs (Cetirizine hcl) .[name] Kitchen.. 1 tab  once daily 7)  Alprazolam 1 Mg Tabs (Alprazolam) .[name] Kitchen.. 1 tab  once daily as needed 8)  Lyrica 50 Mg Caps (Pregabalin) .... 3 capsules three times a day 9)  Coumadin 10 Mg Tabs (Warfarin sodium) .[name] Kitchen.. 12.5 mg on mon and fri, 10 mg other days 10)  Cpap Machine  .... At bedtime 11)  Coumadin 5 Mg Tabs (Warfarin sodium) .... Take as directed  Patient Instructions: 1)  This is simple muscular pain from inactivity. He will continue to walk as much as he tolerates. meds were refilled.  Prescriptions: OXYCODONE-ACETAMINOPHEN 10-325 MG TABS (OXYCODONE-ACETAMINOPHEN) 1 q 4  hours as needed pain  #180 x 0   Entered and Authorized by:   Nelwyn Salisbury MD   Signed by:   Nelwyn Salisbury MD on 12/06/2010   Method used:   Print then Give to Patient   RxID:   [id number] COUMADIN 5 MG TABS (WARFARIN SODIUM) take as directed  #100 x 11   Entered and Authorized by:   Nelwyn Salisbury MD   Signed by:   Nelwyn Salisbury MD on 12/06/2010   Method used:   Print then Give to Patient   RxID:   [id number]    Orders Added: 1)  Protime [[id number]] 2)  Fingerstick [36416] 3)  Est. Patient Level IV [52841]     ANTICOAGULATION RECORD PREVIOUS REGIMEN & LAB RESULTS Anticoagulation Diagnosis:  pulmonary embolism on  09/10/2010 Previous INR Goal Range:  2.5-3.5 on  09/10/2010 Previous INR:  2.9 on  11/23/2010 Previous Coumadin Dose(mg):  12.5mg  on mon & fri 10mg  on other days on  10/22/2010 Previous Regimen:  same on  11/23/2010 Previous Coagulation Comments:  OV on  10/08/2010  NEW REGIMEN & LAB RESULTS Current INR: 3.8 Regimen: same  (no change)   Anticoagulation Visit Questionnaire Coumadin dose missed/changed:  No Abnormal Bleeding Symptoms:  No  Any diet changes including alcohol intake, vegetables or greens since the last visit:  No Any illnesses or hospitalizations since the last visit:  No Any signs of clotting since the last visit (including chest discomfort, dizziness, shortness of breath, arm tingling, slurred speech, swelling or redness in leg):   No  MEDICATIONS OMEPRAZOLE 20 MG CPDR (OMEPRAZOLE) once daily FUROSEMIDE 40 MG TABS (FUROSEMIDE) once daily OXYCODONE-ACETAMINOPHEN 10-325 MG TABS (OXYCODONE-ACETAMINOPHEN) 1 q 4  hours as needed pain PROAIR HFA 108 (90 BASE) MCG/ACT AERS (ALBUTEROL SULFATE) as needed METHOCARBAMOL 500 MG TABS (METHOCARBAMOL) three times a day as needed spasm ZYRTEC ALLERGY 10 MG TABS (CETIRIZINE HCL) 1 tab  once daily ALPRAZOLAM 1 MG TABS (ALPRAZOLAM) 1 TAB once daily as needed LYRICA 50 MG CAPS (PREGABALIN) 3 capsules three times a day COUMADIN 10 MG TABS (WARFARIN SODIUM) 12.5 mg on Mon and Fri, 10 mg other days * CPAP MACHINE at bedtime COUMADIN 5 MG TABS (WARFARIN SODIUM) take as directed    Laboratory Results   Blood Tests      INR: 3.8   (Normal Range: 0.88-1.12   Therap INR: 2.0-3.5) Comments: Rita Ohara  December 06, 2010 2:22 PM

## 2011-01-20 NOTE — Progress Notes (Signed)
Summary: rx alprazolam   Phone Note From Pharmacy   [id number] Call For: [name] MD  Details for Reason: refill alprazolam Summary of Call: refill alpralozam. last refill date 08/31/2010  Follow-up for Phone Call        call in #60 with 5 rf  Follow-up by: Nelwyn Salisbury MD,  December 28, 2010 10:16 AM  Additional Follow-up for Phone Call Additional follow up Details #1::        done  Additional Follow-up by: Pura Spice, RN,  December 28, 2010 10:25 AM    New/Updated Medications: ALPRAZOLAM 1 MG TABS (ALPRAZOLAM) two times a day as needed anxiety Prescriptions: ALPRAZOLAM 1 MG TABS (ALPRAZOLAM) two times a day as needed anxiety  #60 x 5   Entered by:   Pura Spice, RN   Authorized by:   Nelwyn Salisbury MD   Signed by:   Pura Spice, RN on 12/28/2010   Method used:   Telephoned to ...       [facility] [address] (retail)       [address]       [place], [place]  [address]       Ph: [phone] or [phone]       Fax: [phone]   RxID:   [id number]

## 2011-01-20 NOTE — Progress Notes (Signed)
Summary: Pt req refill of Oxycodone #100 1 every 4hrs  Phone Note Refill Request   Refills Requested: Medication #1:  OXYCODONE-ACETAMINOPHEN 10-325 MG TABS 1 q 4  hours as needed pain   Dosage confirmed as above?Dosage Confirmed   Supply Requested: 1 month Pls be sure to have  100count and 1 every 4 hrs.  Pls call when ready for pick up.    Method Requested: Pick up at Office Initial call taken by: Lucy Antigua,  December 30, 2010 11:57 AM  Follow-up for Phone Call        done Follow-up by: Nelwyn Salisbury MD,  December 30, 2010 1:30 PM  Additional Follow-up for Phone Call Additional follow up Details #1::        pt called Additional Follow-up by: Pura Spice, RN,  December 30, 2010 1:36 PM    Prescriptions: OXYCODONE-ACETAMINOPHEN 10-325 MG TABS (OXYCODONE-ACETAMINOPHEN) 1 q 4  hours as needed pain  #100 x 0   Entered and Authorized by:   Nelwyn Salisbury MD   Signed by:   Nelwyn Salisbury MD on 12/30/2010   Method used:   Print then Give to Patient   RxID:   [id number]

## 2011-01-20 NOTE — Letter (Signed)
Summary: [facility]  [facility]   Imported By: Maryln Gottron 12/03/2010 12:59:36  _____________________________________________________________________  External Attachment:    Type:   Image     Comment:   External Document

## 2011-01-20 NOTE — Progress Notes (Signed)
Summary: ? tooth pull   Phone Note Call from Patient   Caller: Patient Call For: Nelwyn Salisbury MD Summary of Call: 12.5 mg. Mondays and Fridays 10 mg q other day. Needs to pull this tooth ASAP and InR needs to be under 3.0. [phone] Initial call taken by: [facility] CMA AAMA,  January 13, 2011 10:20 AM  Follow-up for Phone Call        go ahead and stop the Coumadin for 5 days so he can get the tooth pulled  Follow-up by: Nelwyn Salisbury MD,  January 13, 2011 12:40 PM  Additional Follow-up for Phone Call Additional follow up Details #1::        Pt. notified. Additional Follow-up by: Lynann Beaver CMA AAMA,  January 13, 2011 3:29 PM

## 2011-01-21 NOTE — Letter (Signed)
Summary: [facility]  [facility]   Imported By: Maryln Gottron 04/02/2010 15:24:52  _____________________________________________________________________  External Attachment:    Type:   Image     Comment:   External Document

## 2011-01-27 ENCOUNTER — Other Ambulatory Visit: Payer: Self-pay | Admitting: Family Medicine

## 2011-01-27 DIAGNOSIS — K219 Gastro-esophageal reflux disease without esophagitis: Secondary | ICD-10-CM

## 2011-01-31 ENCOUNTER — Other Ambulatory Visit: Payer: Self-pay

## 2011-01-31 ENCOUNTER — Ambulatory Visit: Payer: Self-pay

## 2011-02-01 ENCOUNTER — Telehealth: Payer: Self-pay | Admitting: Family Medicine

## 2011-02-01 NOTE — Telephone Encounter (Signed)
Pt needs new rx onxycodone 10--325 #100

## 2011-02-02 MED ORDER — OXYCODONE-ACETAMINOPHEN 10-325 MG PO TABS: 1.0000 | ORAL_TABLET | ORAL | Status: DC | PRN

## 2011-02-02 NOTE — Telephone Encounter (Signed)
Done, in your basket  

## 2011-02-03 NOTE — Telephone Encounter (Signed)
Per Nelva Bush  She picked up rx from my basket and pt picked up

## 2011-02-16 ENCOUNTER — Telehealth: Payer: Self-pay | Admitting: Family Medicine

## 2011-02-16 NOTE — Telephone Encounter (Signed)
Pt needs new rx oxycodone 10-325 #100

## 2011-02-18 MED ORDER — OXYCODONE-ACETAMINOPHEN 10-325 MG PO TABS: 1.0000 | ORAL_TABLET | ORAL | Status: DC | PRN

## 2011-02-18 NOTE — Telephone Encounter (Signed)
Done, in your box

## 2011-02-21 ENCOUNTER — Telehealth: Payer: Self-pay | Admitting: Family Medicine

## 2011-02-21 NOTE — Telephone Encounter (Signed)
lmoam to make an appt.

## 2011-02-21 NOTE — Telephone Encounter (Signed)
Pt is needing to get dopplers and ct scan of lungs done. Pt is wondering if he needs to be seen by Dr Clent Ridges first or can Dr Clent Ridges just order these, as discuss. Pt also wanted to know that pt has started hiccups again.

## 2011-02-21 NOTE — Telephone Encounter (Signed)
Make him an OV to see me soon

## 2011-02-23 ENCOUNTER — Ambulatory Visit (INDEPENDENT_AMBULATORY_CARE_PROVIDER_SITE_OTHER): Payer: PRIVATE HEALTH INSURANCE | Admitting: Family Medicine

## 2011-02-23 ENCOUNTER — Encounter: Payer: Self-pay | Admitting: Family Medicine

## 2011-02-23 VITALS — BP 110/70 | Temp 99.0°F | Ht 70.5 in

## 2011-02-23 DIAGNOSIS — I82409 Acute embolism and thrombosis of unspecified deep veins of unspecified lower extremity: Secondary | ICD-10-CM

## 2011-02-23 DIAGNOSIS — I2699 Other pulmonary embolism without acute cor pulmonale: Secondary | ICD-10-CM | POA: Insufficient documentation

## 2011-02-23 DIAGNOSIS — M549 Dorsalgia, unspecified: Secondary | ICD-10-CM

## 2011-02-23 LAB — POCT INR: INR: 4.4

## 2011-02-23 MED ORDER — OMEPRAZOLE 40 MG PO CPDR: 40.0000 mg | DELAYED_RELEASE_CAPSULE | Freq: Every day | ORAL | Status: DC

## 2011-02-23 NOTE — Progress Notes (Signed)
  Subjective:    Patient ID: Gregory Cortez, male    DOB: 11-08-1956, 55 y.o.   MRN: [id number]  HPI Here to follow up on PEs , leg DVTs, and back pain. His emboli were originally seen on 09-02-10, and he has been anticoagulated since then. He tries to walk a little but exercis eis very difficult due to obesity and chronic low back pain. He is on Percocets regularly. Our plan has been for Dr. Ethelene Hal to do ESIs to his spine, but he needs to be off Coumadin before we can attempt these.   Review of Systems  Constitutional: Negative.   Respiratory: Positive for shortness of breath. Negative for chest tightness and wheezing.   Cardiovascular: Positive for palpitations and leg swelling. Negative for chest pain.  Musculoskeletal: Positive for back pain.       Objective:   Physical Exam  Constitutional: He appears well-developed and well-nourished.  Cardiovascular: Normal rate, regular rhythm, normal heart sounds and intact distal pulses.   Pulmonary/Chest: Effort normal and breath sounds normal.          Assessment & Plan:  Our plan has been to recheck the emboli after 6 months of Coumadin therapy, so we will set up studies for next week to include venous dopplers of the legs and a chest  CT.

## 2011-02-25 ENCOUNTER — Other Ambulatory Visit: Payer: Self-pay | Admitting: Family Medicine

## 2011-02-25 ENCOUNTER — Encounter: Payer: Self-pay | Admitting: Pulmonary Disease

## 2011-02-25 DIAGNOSIS — I82409 Acute embolism and thrombosis of unspecified deep veins of unspecified lower extremity: Secondary | ICD-10-CM

## 2011-02-28 ENCOUNTER — Telehealth: Payer: Self-pay | Admitting: Family Medicine

## 2011-02-28 ENCOUNTER — Encounter (INDEPENDENT_AMBULATORY_CARE_PROVIDER_SITE_OTHER): Payer: 59

## 2011-02-28 ENCOUNTER — Ambulatory Visit (INDEPENDENT_AMBULATORY_CARE_PROVIDER_SITE_OTHER)
Admission: RE | Admit: 2011-02-28 | Discharge: 2011-02-28 | Disposition: A | Discharge status: Home / Self Care | Payer: 59 | Source: Ambulatory Visit | Attending: Family Medicine | Admitting: Family Medicine

## 2011-02-28 ENCOUNTER — Encounter: Payer: Self-pay | Admitting: Family Medicine

## 2011-02-28 DIAGNOSIS — R0602 Shortness of breath: Secondary | ICD-10-CM

## 2011-02-28 DIAGNOSIS — M79609 Pain in unspecified limb: Secondary | ICD-10-CM

## 2011-02-28 DIAGNOSIS — I2699 Other pulmonary embolism without acute cor pulmonale: Secondary | ICD-10-CM

## 2011-02-28 MED ORDER — IOHEXOL 350 MG/ML SOLN
120.0000 mL | Freq: Once | INTRAVENOUS | Status: AC | PRN
  Administered 2011-02-28: 120 mL via INTRAVENOUS

## 2011-02-28 NOTE — Telephone Encounter (Signed)
Pt needs ct scan results

## 2011-03-01 ENCOUNTER — Telehealth: Payer: Self-pay | Admitting: Family Medicine

## 2011-03-01 ENCOUNTER — Telehealth: Payer: Self-pay | Admitting: *Deleted

## 2011-03-01 MED ORDER — CYCLOBENZAPRINE HCL 10 MG PO TABS: 10.0000 mg | ORAL_TABLET | Freq: Three times a day (TID) | ORAL | Status: DC | PRN

## 2011-03-01 MED ORDER — OXYCODONE-ACETAMINOPHEN 10-325 MG PO TABS: 1.0000 | ORAL_TABLET | ORAL | Status: DC | PRN

## 2011-03-01 NOTE — Telephone Encounter (Signed)
Done

## 2011-03-01 NOTE — Telephone Encounter (Signed)
Pt also needs refill of Oxycodone. Call when ready.

## 2011-03-01 NOTE — Telephone Encounter (Signed)
See the report. 

## 2011-03-01 NOTE — Telephone Encounter (Signed)
Add also a rx for Flexeril. Patient wants a call back from nurse today.

## 2011-03-01 NOTE — Miscellaneous (Signed)
Summary: Orders Update  Clinical Lists Changes  Orders: Added new Test order of Venous Duplex Lower Extremity (Venous Duplex Lower) - Signed 

## 2011-03-01 NOTE — Telephone Encounter (Signed)
Pt here earlier and family does not understand the results of the Chest CT.  Please call to discuss.

## 2011-03-02 ENCOUNTER — Telehealth: Payer: Self-pay

## 2011-03-02 NOTE — Telephone Encounter (Signed)
Pt aware.

## 2011-03-02 NOTE — Telephone Encounter (Signed)
Message copied by Madison Hickman on Wed Mar 02, 2011  8:05 AM ------      Message from: Dwaine Deter      Created: Tue Mar 01, 2011  9:33 AM       Normal scan. The PEs have all resolved

## 2011-03-02 NOTE — Telephone Encounter (Signed)
This nodule has been seen before. It has not changed at all and is quite benign. No follow up is needed

## 2011-03-02 NOTE — Telephone Encounter (Signed)
Spoke with pt concerned about "nodule noted on CT report" and wants adivse on this.

## 2011-03-03 LAB — CK TOTAL AND CKMB (NOT AT ARMC)
CK, MB: 1.4 ng/mL (ref 0.3–4.0)
Relative Index: 0.7 (ref 0.0–2.5)
Total CK: 209 U/L (ref 7–232)

## 2011-03-03 LAB — DIFFERENTIAL
Basophils Absolute: 0 10*3/uL (ref 0.0–0.1)
Basophils Relative: 1 % (ref 0–1)
Eosinophils Absolute: 0.3 10*3/uL (ref 0.0–0.7)
Eosinophils Relative: 3 % (ref 0–5)
Lymphocytes Relative: 11 % — ABNORMAL LOW (ref 12–46)
Lymphs Abs: 0.9 10*3/uL (ref 0.7–4.0)
Monocytes Absolute: 0.8 10*3/uL (ref 0.1–1.0)
Monocytes Relative: 9 % (ref 3–12)
Neutro Abs: 6.5 10*3/uL (ref 1.7–7.7)
Neutrophils Relative %: 76 % (ref 43–77)

## 2011-03-03 LAB — CBC
HCT: 45.3 % (ref 39.0–52.0)
HCT: 46.3 % (ref 39.0–52.0)
HCT: 47.2 % (ref 39.0–52.0)
Hemoglobin: 15.2 g/dL (ref 13.0–17.0)
Hemoglobin: 15.8 g/dL (ref 13.0–17.0)
Hemoglobin: 16.4 g/dL (ref 13.0–17.0)
MCH: 31.6 pg (ref 26.0–34.0)
MCH: 32.3 pg (ref 26.0–34.0)
MCH: 32.4 pg (ref 26.0–34.0)
MCHC: 33.6 g/dL (ref 30.0–36.0)
MCHC: 34.1 g/dL (ref 30.0–36.0)
MCHC: 34.7 g/dL (ref 30.0–36.0)
MCV: 93.3 fL (ref 78.0–100.0)
MCV: 94.2 fL (ref 78.0–100.0)
MCV: 94.7 fL (ref 78.0–100.0)
Platelets: 231 10*3/uL (ref 150–400)
Platelets: 237 10*3/uL (ref 150–400)
Platelets: 252 10*3/uL (ref 150–400)
RBC: 4.81 MIL/uL (ref 4.22–5.81)
RBC: 4.89 MIL/uL (ref 4.22–5.81)
RBC: 5.06 MIL/uL (ref 4.22–5.81)
RDW: 12.7 % (ref 11.5–15.5)
RDW: 12.7 % (ref 11.5–15.5)
RDW: 12.8 % (ref 11.5–15.5)
WBC: 5.6 10*3/uL (ref 4.0–10.5)
WBC: 6.8 10*3/uL (ref 4.0–10.5)
WBC: 8.5 10*3/uL (ref 4.0–10.5)

## 2011-03-03 LAB — LUPUS ANTICOAGULANT PANEL
DRVVT: 44.7 secs — ABNORMAL HIGH (ref 36.2–44.3)
Lupus Anticoagulant: NOT DETECTED
PTT Lupus Anticoagulant: 54 secs — ABNORMAL HIGH (ref 30.0–45.6)
PTTLA 4:1 Mix: 45.3 secs (ref 30.0–45.6)
dRVVT Incubated 1:1 Mix: 40.6 secs (ref 36.2–44.3)

## 2011-03-03 LAB — COMPREHENSIVE METABOLIC PANEL
ALT: 25 U/L (ref 0–53)
AST: 22 U/L (ref 0–37)
Albumin: 3 g/dL — ABNORMAL LOW (ref 3.5–5.2)
Alkaline Phosphatase: 54 U/L (ref 39–117)
BUN: 4 mg/dL — ABNORMAL LOW (ref 6–23)
CO2: 27 mEq/L (ref 19–32)
Calcium: 8.7 mg/dL (ref 8.4–10.5)
Chloride: 104 mEq/L (ref 96–112)
Creatinine, Ser: 0.88 mg/dL (ref 0.4–1.5)
GFR calc Af Amer: >60 mL/min (ref >60)
GFR calc non Af Amer: >60 mL/min (ref >60)
Glucose, Bld: 111 mg/dL — ABNORMAL HIGH (ref 70–99)
Potassium: 3.4 mEq/L — ABNORMAL LOW (ref 3.5–5.1)
Sodium: 140 mEq/L (ref 135–145)
Total Bilirubin: 0.7 mg/dL (ref 0.3–1.2)
Total Protein: 6.4 g/dL (ref 6.0–8.3)

## 2011-03-03 LAB — PROTIME-INR
INR: 1.1 (ref 0.00–1.49)
INR: 1.18 (ref 0.00–1.49)
INR: 1.23 (ref 0.00–1.49)
Prothrombin Time: 14.4 seconds (ref 11.6–15.2)
Prothrombin Time: 15.2 seconds (ref 11.6–15.2)
Prothrombin Time: 15.7 seconds — ABNORMAL HIGH (ref 11.6–15.2)

## 2011-03-03 LAB — BASIC METABOLIC PANEL
BUN: 5 mg/dL — ABNORMAL LOW (ref 6–23)
CO2: 25 mEq/L (ref 19–32)
Calcium: 9 mg/dL (ref 8.4–10.5)
Chloride: 105 mEq/L (ref 96–112)
Creatinine, Ser: 0.84 mg/dL (ref 0.4–1.5)
GFR calc Af Amer: >60 mL/min (ref >60)
GFR calc non Af Amer: >60 mL/min (ref >60)
Glucose, Bld: 98 mg/dL (ref 70–99)
Potassium: 3.7 mEq/L (ref 3.5–5.1)
Sodium: 139 mEq/L (ref 135–145)

## 2011-03-03 LAB — BETA-2-GLYCOPROTEIN I ABS, IGG/M/A
Beta-2 Glyco I IgG: 0 G Units (ref <20)
Beta-2-Glycoprotein I IgA: 1 A Units (ref <20)
Beta-2-Glycoprotein I IgM: 1 M Units (ref <20)

## 2011-03-03 LAB — LIPID PANEL
Cholesterol: 185 mg/dL (ref 0–200)
Cholesterol: 185 mg/dL (ref 0–200)
HDL: 25 mg/dL — ABNORMAL LOW (ref >39)
HDL: 29 mg/dL — ABNORMAL LOW (ref >39)
LDL Cholesterol: 128 mg/dL — ABNORMAL HIGH (ref 0–99)
LDL Cholesterol: 137 mg/dL — ABNORMAL HIGH (ref 0–99)
Total CHOL/HDL Ratio: 6.4 RATIO
Total CHOL/HDL Ratio: 7.4 RATIO
Triglycerides: 160 mg/dL — ABNORMAL HIGH (ref <150)
Triglycerides: 96 mg/dL (ref <150)
VLDL: 19 mg/dL (ref 0–40)
VLDL: 32 mg/dL (ref 0–40)

## 2011-03-03 LAB — POCT CARDIAC MARKERS
CKMB, poc: <1 ng/mL — ABNORMAL LOW (ref 1.0–8.0)
Myoglobin, poc: 111 ng/mL (ref 12–200)
Troponin i, poc: <0.05 ng/mL (ref 0.00–0.09)

## 2011-03-03 LAB — TROPONIN I: Troponin I: <0.01 ng/mL (ref 0.00–0.06)

## 2011-03-03 LAB — CARDIAC PANEL(CRET KIN+CKTOT+MB+TROPI)
CK, MB: 1.2 ng/mL (ref 0.3–4.0)
CK, MB: 1.5 ng/mL (ref 0.3–4.0)
Relative Index: 0.7 (ref 0.0–2.5)
Relative Index: 0.8 (ref 0.0–2.5)
Total CK: 179 U/L (ref 7–232)
Total CK: 183 U/L (ref 7–232)
Troponin I: 0.01 ng/mL (ref 0.00–0.06)
Troponin I: 0.02 ng/mL (ref 0.00–0.06)

## 2011-03-03 LAB — POCT I-STAT, CHEM 8
BUN: 11 mg/dL (ref 6–23)
Calcium, Ion: 1.23 mmol/L (ref 1.12–1.32)
Chloride: 106 mEq/L (ref 96–112)
Creatinine, Ser: 1 mg/dL (ref 0.4–1.5)
Glucose, Bld: 102 mg/dL — ABNORMAL HIGH (ref 70–99)
HCT: 53 % — ABNORMAL HIGH (ref 39.0–52.0)
Hemoglobin: 18 g/dL — ABNORMAL HIGH (ref 13.0–17.0)
Potassium: 3.8 mEq/L (ref 3.5–5.1)
Sodium: 143 mEq/L (ref 135–145)
TCO2: 25 mmol/L (ref 0–100)

## 2011-03-03 LAB — PROTEIN C ACTIVITY: Protein C Activity: 106 % (ref 75–133)

## 2011-03-03 LAB — ANTITHROMBIN III: AntiThromb III Func: 83 % (ref 76–126)

## 2011-03-03 LAB — PROTEIN S, TOTAL: Protein S Ag, Total: 119 % (ref 70–140)

## 2011-03-03 LAB — CARDIOLIPIN ANTIBODIES, IGG, IGM, IGA
Anticardiolipin IgA: 10 APL U/mL — ABNORMAL LOW (ref <22)
Anticardiolipin IgG: 7 GPL U/mL — ABNORMAL LOW (ref <23)
Anticardiolipin IgM: 0 MPL U/mL — ABNORMAL LOW (ref <11)

## 2011-03-03 LAB — PROTEIN S ACTIVITY: Protein S Activity: 76 % (ref 69–129)

## 2011-03-03 LAB — HEPARIN LEVEL (UNFRACTIONATED)
Heparin Unfractionated: 0.13 IU/mL — ABNORMAL LOW (ref 0.30–0.70)
Heparin Unfractionated: 0.55 IU/mL (ref 0.30–0.70)
Heparin Unfractionated: <0.1 IU/mL — ABNORMAL LOW (ref 0.30–0.70)

## 2011-03-03 LAB — HOMOCYSTEINE: Homocysteine: 10 umol/L (ref 4.0–15.4)

## 2011-03-03 LAB — PROTEIN C, TOTAL: Protein C, Total: 96 % (ref 70–140)

## 2011-03-03 LAB — FACTOR 5 LEIDEN

## 2011-03-03 LAB — APTT: aPTT: 28 seconds (ref 24–37)

## 2011-03-03 LAB — PROTHROMBIN GENE MUTATION

## 2011-03-03 LAB — HEMOCCULT GUIAC POC 1CARD (OFFICE): Fecal Occult Bld: NEGATIVE

## 2011-03-07 ENCOUNTER — Telehealth: Payer: Self-pay

## 2011-03-07 NOTE — Telephone Encounter (Signed)
Pt called and states to get epidural inj next Thursday  March 17 2011 and needs to be off coumadin for 5 days Return call  [phone]

## 2011-03-07 NOTE — Telephone Encounter (Signed)
Spoke with pt he said epidural injection next Tuesday March 15 2011  And he said :dopplers looked fine".  Call on cell [phone]

## 2011-03-10 NOTE — Telephone Encounter (Signed)
The venous dopplers and chest CT were all clear with no emboli seen. Therefore he can stop the Coumadin immediately, and proceed with the [facility] on 03-15-11 as planned.

## 2011-03-10 NOTE — Telephone Encounter (Signed)
Pt aware.

## 2011-03-15 ENCOUNTER — Ambulatory Visit: Payer: 59 | Admitting: Family Medicine

## 2011-03-15 DIAGNOSIS — I2699 Other pulmonary embolism without acute cor pulmonale: Secondary | ICD-10-CM

## 2011-03-15 LAB — POCT INR: INR: 1.3

## 2011-03-15 NOTE — Patient Instructions (Signed)
Patient said Dr. Clent Ridges has taken him off coumadin.

## 2011-03-16 ENCOUNTER — Encounter: Payer: Self-pay | Admitting: Family Medicine

## 2011-03-21 ENCOUNTER — Telehealth: Payer: Self-pay | Admitting: Family Medicine

## 2011-03-21 NOTE — Telephone Encounter (Signed)
Pt needs new rx oxycodone 10-325 mg °

## 2011-03-21 NOTE — Telephone Encounter (Signed)
He is now under the care of Dr. Ethelene Hal for the back and leg pains, so any pain meds need to come from Dr. Ethelene Hal

## 2011-03-24 ENCOUNTER — Other Ambulatory Visit: Payer: Self-pay | Admitting: Pulmonary Disease

## 2011-03-24 ENCOUNTER — Telehealth: Payer: Self-pay | Admitting: Pulmonary Disease

## 2011-03-24 DIAGNOSIS — G4733 Obstructive sleep apnea (adult) (pediatric): Secondary | ICD-10-CM

## 2011-03-24 NOTE — Telephone Encounter (Signed)
Pcc's aware order sent.

## 2011-03-24 NOTE — Telephone Encounter (Signed)
Will send an order to pcc. 

## 2011-03-24 NOTE — Telephone Encounter (Signed)
Dr. Shelle Iron, Per Theodoro Grist with [facility], pt needs new cpap.  PCCs are needing an order for this though.  Pls advise if order ok.  Thanks!

## 2011-03-24 NOTE — Telephone Encounter (Signed)
Spoke w/ Theodoro Grist and he states he spoke with Gregory Cortez yesterday and stated she was taking care of getting pt a new rx for pt to get a new cpap since his was broken. Theodoro Grist states they still haven't received the order. Rhonda please advise. Thanks  Carver Fila, CMA

## 2011-03-29 NOTE — Telephone Encounter (Signed)
Pt is aware meds must come from Dr Ethelene Hal

## 2011-03-31 ENCOUNTER — Other Ambulatory Visit: Payer: Self-pay

## 2011-03-31 MED ORDER — PREGABALIN 50 MG PO CAPS: 50.0000 mg | ORAL_CAPSULE | Freq: Three times a day (TID) | ORAL | Status: DC

## 2011-04-01 ENCOUNTER — Ambulatory Visit: Payer: Self-pay | Admitting: Pulmonary Disease

## 2011-04-25 ENCOUNTER — Telehealth: Payer: Self-pay | Admitting: *Deleted

## 2011-04-25 ENCOUNTER — Emergency Department (HOSPITAL_COMMUNITY): Payer: 59

## 2011-04-25 ENCOUNTER — Inpatient Hospital Stay (HOSPITAL_COMMUNITY)
Admission: EM | Admit: 2011-04-25 | Discharge: 2011-04-26 | DRG: 176 | Disposition: A | Discharge status: Home / Self Care | Payer: 59 | Attending: Internal Medicine | Admitting: Internal Medicine

## 2011-04-25 DIAGNOSIS — I2699 Other pulmonary embolism without acute cor pulmonale: Principal | ICD-10-CM | POA: Diagnosis present

## 2011-04-25 DIAGNOSIS — K219 Gastro-esophageal reflux disease without esophagitis: Secondary | ICD-10-CM | POA: Diagnosis present

## 2011-04-25 DIAGNOSIS — R0789 Other chest pain: Secondary | ICD-10-CM | POA: Diagnosis present

## 2011-04-25 DIAGNOSIS — G4733 Obstructive sleep apnea (adult) (pediatric): Secondary | ICD-10-CM | POA: Diagnosis present

## 2011-04-25 LAB — DIFFERENTIAL
Basophils Absolute: 0 10*3/uL (ref 0.0–0.1)
Basophils Relative: 0 % (ref 0–1)
Eosinophils Absolute: 0.1 10*3/uL (ref 0.0–0.7)
Eosinophils Relative: 1 % (ref 0–5)
Lymphocytes Relative: 12 % (ref 12–46)
Lymphs Abs: 1.2 10*3/uL (ref 0.7–4.0)
Monocytes Absolute: 1 10*3/uL (ref 0.1–1.0)
Monocytes Relative: 10 % (ref 3–12)
Neutro Abs: 8.2 10*3/uL — ABNORMAL HIGH (ref 1.7–7.7)
Neutrophils Relative %: 78 % — ABNORMAL HIGH (ref 43–77)

## 2011-04-25 LAB — CBC
HCT: 49.2 % (ref 39.0–52.0)
Hemoglobin: 17.2 g/dL — ABNORMAL HIGH (ref 13.0–17.0)
MCH: 32.5 pg (ref 26.0–34.0)
MCHC: 35 g/dL (ref 30.0–36.0)
MCV: 93 fL (ref 78.0–100.0)
Platelets: 239 10*3/uL (ref 150–400)
RBC: 5.29 MIL/uL (ref 4.22–5.81)
RDW: 14 % (ref 11.5–15.5)
WBC: 10.5 10*3/uL (ref 4.0–10.5)

## 2011-04-25 LAB — CARDIAC PANEL(CRET KIN+CKTOT+MB+TROPI)
CK, MB: 2.1 ng/mL (ref 0.3–4.0)
Relative Index: INVALID (ref 0.0–2.5)
Total CK: 88 U/L (ref 7–232)
Troponin I: <0.3 ng/mL (ref <0.30)

## 2011-04-25 LAB — BASIC METABOLIC PANEL
BUN: 14 mg/dL (ref 6–23)
CO2: 23 mEq/L (ref 19–32)
Calcium: 9.4 mg/dL (ref 8.4–10.5)
Chloride: 101 mEq/L (ref 96–112)
Creatinine, Ser: 0.74 mg/dL (ref 0.4–1.5)
GFR calc Af Amer: >60 mL/min (ref >60)
GFR calc non Af Amer: >60 mL/min (ref >60)
Glucose, Bld: 90 mg/dL (ref 70–99)
Potassium: 3.9 mEq/L (ref 3.5–5.1)
Sodium: 136 mEq/L (ref 135–145)

## 2011-04-25 LAB — D-DIMER, QUANTITATIVE: D-Dimer, Quant: 3.99 ug/mL-FEU — ABNORMAL HIGH (ref 0.00–0.48)

## 2011-04-25 LAB — PROTIME-INR
INR: 1.09 (ref 0.00–1.49)
Prothrombin Time: 14.3 seconds (ref 11.6–15.2)

## 2011-04-25 LAB — POCT CARDIAC MARKERS
CKMB, poc: <1 ng/mL — ABNORMAL LOW (ref 1.0–8.0)
Myoglobin, poc: 64.4 ng/mL (ref 12–200)
Troponin i, poc: <0.05 ng/mL (ref 0.00–0.09)

## 2011-04-25 LAB — ANTITHROMBIN III: AntiThromb III Func: 92 % (ref 75–120)

## 2011-04-25 MED ORDER — IOHEXOL 300 MG/ML  SOLN
100.0000 mL | Freq: Once | INTRAMUSCULAR | Status: AC | PRN
  Administered 2011-04-25: 100 mL via INTRAVENOUS

## 2011-04-25 NOTE — Telephone Encounter (Signed)
Wife called to let Dr Clent Ridges know that the CT scan showed multiple PEs bilaterally more the right than left.

## 2011-04-25 NOTE — Telephone Encounter (Signed)
Pt calls in c/o chest heaviness, SOB, burning in chest and has history of PE.  Off Coumadin 2 weeks ago.  Per Dr. Clent Ridges, pt should go to the ER for evaluation. Pt notified.

## 2011-04-26 LAB — CARDIAC PANEL(CRET KIN+CKTOT+MB+TROPI)
CK, MB: 1.9 ng/mL (ref 0.3–4.0)
CK, MB: 1.7 ng/mL (ref 0.3–4.0)
Relative Index: INVALID (ref 0.0–2.5)
Relative Index: INVALID (ref 0.0–2.5)
Total CK: 78 U/L (ref 7–232)
Total CK: 76 U/L (ref 7–232)
Troponin I: <0.3 ng/mL (ref <0.30)
Troponin I: <0.3 ng/mL (ref <0.30)

## 2011-04-26 LAB — COMPREHENSIVE METABOLIC PANEL
ALT: 29 U/L (ref 0–53)
AST: 16 U/L (ref 0–37)
Albumin: 3.2 g/dL — ABNORMAL LOW (ref 3.5–5.2)
Alkaline Phosphatase: 91 U/L (ref 39–117)
BUN: 14 mg/dL (ref 6–23)
CO2: 28 mEq/L (ref 19–32)
Calcium: 9.3 mg/dL (ref 8.4–10.5)
Chloride: 103 mEq/L (ref 96–112)
Creatinine, Ser: 0.73 mg/dL (ref 0.4–1.5)
GFR calc Af Amer: >60 mL/min (ref >60)
GFR calc non Af Amer: >60 mL/min (ref >60)
Glucose, Bld: 114 mg/dL — ABNORMAL HIGH (ref 70–99)
Potassium: 4 mEq/L (ref 3.5–5.1)
Sodium: 139 mEq/L (ref 135–145)
Total Bilirubin: 0.5 mg/dL (ref 0.3–1.2)
Total Protein: 6.9 g/dL (ref 6.0–8.3)

## 2011-04-26 LAB — CBC
HCT: 49.3 % (ref 39.0–52.0)
Hemoglobin: 16.6 g/dL (ref 13.0–17.0)
MCH: 31.7 pg (ref 26.0–34.0)
MCHC: 33.7 g/dL (ref 30.0–36.0)
MCV: 94.3 fL (ref 78.0–100.0)
Platelets: 250 10*3/uL (ref 150–400)
RBC: 5.23 MIL/uL (ref 4.22–5.81)
RDW: 14 % (ref 11.5–15.5)
WBC: 10 10*3/uL (ref 4.0–10.5)

## 2011-04-26 LAB — PROTIME-INR
INR: 1.1 (ref 0.00–1.49)
Prothrombin Time: 14.4 seconds (ref 11.6–15.2)

## 2011-04-26 LAB — PROTEIN S ACTIVITY: Protein S Activity: 78 % (ref 69–129)

## 2011-04-26 LAB — PROTHROMBIN GENE MUTATION

## 2011-04-26 LAB — PROTEIN C, TOTAL: Protein C, Total: 117 % (ref 72–160)

## 2011-04-26 LAB — PROTEIN S, TOTAL: Protein S Ag, Total: 145 % (ref 60–150)

## 2011-04-26 LAB — PROTEIN C ACTIVITY: Protein C Activity: 147 % — ABNORMAL HIGH (ref 75–133)

## 2011-04-26 NOTE — Telephone Encounter (Signed)
noted 

## 2011-04-27 LAB — FACTOR 5 LEIDEN

## 2011-04-27 NOTE — Discharge Summary (Signed)
NAME:  Gregory Cortez, HU NO.:  [id number]  MEDICAL RECORD NO.:  [id number]           PATIENT TYPE:  O  LOCATION:  4713                         FACILITY:  [facility]  PHYSICIAN:  Pleas Koch, MD        DATE OF BIRTH:  [birth year]  DATE OF ADMISSION:  04/25/2011 DATE OF DISCHARGE:                              DISCHARGE SUMMARY   HISTORY OF PRESENT ILLNESS:  This is a 55 year old male with history of multiple surgeries, testosterone deficiencies, and allergies, who presented to hospital at [facility] with recurrent bilateral PEs.  The patient was seen in the emergency room and was having some shortness of breath.  He states this started about 1 week prior and had just received testosterone shot prior to this on the morning of admission.  Vitals on admission 98.5, blood pressure 106/94, pulse was 79, respirations 22, and the patient was satting 95% on room air.  He did not have any lower extremity pain or swelling.  Chest is clinically clear.  HOSPITAL COURSE: 1. Recurrent DVT.  He has had a hypercoagulable workup here including     antithrombin protein S, protein C, Leiden Factor.  He is discharged     on Lovenox b.i.d.  He states he has had a colonoscopy and was told     to have this repeated at the age of 49 as this is normal.  I have     taken him off his testosterone as this has a potential for     hypercoagulability and DVTs, and I have counseled him regarding     alternative measures for things like weight loss. 2. Obstructive sleep apnea.  The patient will continue on a CPAP     machine at 60 minutes 5 and 90 minutes.  Follow up with     pulmonologist as an outpatient. 3. Chest heaviness.  The patient had 3 sets of cardiac enzymes that     were negative.  These were done in view of his chest tightness.  On the day of discharge, the patient was satting well.  Temperature was 97.9, pulse was 72, respirations 18, blood pressure 118-127, 74-78.  The patient was  satting 94% on room air.  He had no shortness of breath, sitting up in bed.  Chest clinically clear.  No nausea.  No vomiting. No chest pain.  Abdomen, soft and nontender.  No tenderness in the lower extremities.  Power was equal bilaterally.  He will be started on Coumadin today and will need an INR in 3-5 days per Dr. [name].  I have also spoken with Dr. Arbutus Ped of Oncology who stated that there was nothing further they will do.  The patient would not benefit from a Greenfield filter at this point as that is an invasive procedure and they would not change the course of management. If he had been anticoagulated and developed PE despite that, then I would warrant that.         ______________________________ Pleas Koch, MD    JS/MEDQ  D:  04/26/2011  T:  04/26/2011  Job:  [id number]  cc:  Tera Mater. Clent Ridges, MD  Electronically Signed by Pleas Koch MD on 04/27/2011 08:21:40 PM

## 2011-04-27 NOTE — H&P (Signed)
NAME:  Gregory Cortez, MARKEN NO.:  [id number]  MEDICAL RECORD NO.:  [id number]           PATIENT TYPE:  E  LOCATION:  [facility]                         FACILITY:  [facility]  PHYSICIAN:  Pleas Koch, MD        DATE OF BIRTH:  07-18-1956  DATE OF ADMISSION:  04/25/2011 DATE OF DISCHARGE:                             HISTORY & PHYSICAL   PRIMARY CARE PHYSICIAN:  Tera Mater. Clent Ridges, MD  CHIEF COMPLAINT:  Hiccups and heaviness in the chest.  HISTORY OF PRESENT ILLNESS:  Briefly, this is a 55 year old male with known history of OSA on CPAP, multiple surgeries in knees and shoulders, reflux, testosterone deficiency, and seasonal allergies, who presents with hiccups and chest pain, heavy chest for the past 1 week.  He thought he was getting allergies and went to see his PCP and was taking Allegra and Zyrtec for this, and it was noted that he started having increasing shortness of breath and dyspnea on minimal exertion and just sitting straight up in bed.  He came to the ED and was evaluated and found to have repeated multiple pulmonary embolisms.  He denies any fever although he does have some middle of the chest pain.  He has no nausea, no vomiting.  He has no dark stools, no tarry stools, and no diarrhea or constipation.  He has some nasal stuffiness.  PAST SURGICAL HISTORY:  Significant for double hernia repair.  He has had multiple epidural steroid injections after March 03, 2011 once he was off of Coumadin.  He has had knee surgeries and shoulder surgeries.  ALLERGIES:  He has no known drug allergies.  FAMILY HISTORY:  His father had knee surgeries.  Mother has no significant issues.  The patient is not a smoker and does drink.  His wife is with him accompaniment him at bedside.  MEDICATIONS:  The patient is on Prilosec for reflux.  The patient takes p.r.n. Lasix unknown dose.  The patient takes aids for pain and gets testosterone shots every 2 weeks for testosterone  deficiency.  PHYSICAL EXAMINATION:  GENERAL:  The patient is a very pleasant, alert, and oriented Caucasian male, in no significant distress. VITAL SIGNS:  Temperature 98.5, blood pressure 106/94, pulse 79, respirations 22, O2 sats 95% on room air. NECK:  He has a very thick neck.  Mallampati score 4. HEENT:  No pallor.  No icterus. CHEST:  Clinically clear. HEART:  S1, S2.  No murmurs, rubs, or gallops. ABDOMEN:  Obese, nondistended.  No rebound.  No guarding. EXTREMITIES:  Soft with trace edema. NEUROLOGICAL:  Cranial nerves II through XII are intact.  LABORATORY DATA:  EKG done today looks normal sinus rhythm, CK-MB is less than 1.0, troponin is less than 0.05, myoglobin is 64.4.  His CBC showed a hemoglobin of 17.2, hematocrit of 49.2, WBC of 10.5, platelet count 239.  D-dimer was positive at 3.99, INR was 1.09.  Sodium 136, potassium 3.9, chloride 101, BUN 14, creatinine 0.74, calcium 9.4.  The patient had CT angiogram of chest showing large bilateral pulmonary emboli, right greater than left, small-to-moderate hiatal hernia.  IMPRESSION/ASSESSMENT: 1.  Recurrent deep vein thrombosis.  I have already ordered a     hypercoagulable panel including antithrombin, protein C, protein S,     and Leiden factor.  The patient will start Lovenox b.i.d.  I will     enlist the help of oncologist to help determine what the cause of     this might be evidence does point to screen for possible malignancy     and if he does not, he probably would benefit from a colonoscopy     and a PSA testing at this point in time.  He will be started on     Coumadin in the morning, and he will have to continue this     lifelong.  I am unsure as to whether testosterone would be the     cause of his hypercoagulability, but when looking up in up to date,     there is a possibility of deep vein thrombosis with this, and this     may skew his tests on the coagulation panel.  He probably would not     benefit from  being on this in the long term. 2. Morbid obesity.  The patient will need counseling regarding this. 3. CPAP and obstruction sleep apnea.  The patient states his settings     are 11/5.  The patient would likely need 11/5 to 16/5 at night as     he just got a new machine.  He will follow up with Pulmonology as     an outpatient. 4. Reflux.  The patient can continue his Prilosec when his medications     are reconciled. 5. Chest heaviness.  We will ensure that the patient has cardiac     enzymes x3 done just to rule out any and/or cardiac event.  This is     unlikely and his TIMI score is low given that he has no traditional     risk factors, although the patient is slightly overweight.  I will     get an EKG in the morning to compare.  I spent 30 minutes of time coordinating this patient's discharge.          ______________________________ Pleas Koch, MD     JS/MEDQ  D:  04/25/2011  T:  04/25/2011  Job:  [id number]  cc:   Jeannett Senior A. Clent Ridges, MD Stefani Dama, M.D.  Electronically Signed by Pleas Koch MD on 04/27/2011 08:21:32 PM

## 2011-05-03 ENCOUNTER — Encounter: Payer: Self-pay | Admitting: Family Medicine

## 2011-05-03 ENCOUNTER — Ambulatory Visit (INDEPENDENT_AMBULATORY_CARE_PROVIDER_SITE_OTHER): Payer: 59 | Admitting: Family Medicine

## 2011-05-03 VITALS — BP 104/72 | HR 82 | Temp 98.2°F | Resp 18 | Wt 335.0 lb

## 2011-05-03 DIAGNOSIS — R079 Chest pain, unspecified: Secondary | ICD-10-CM

## 2011-05-03 DIAGNOSIS — I2699 Other pulmonary embolism without acute cor pulmonale: Secondary | ICD-10-CM

## 2011-05-03 LAB — POCT INR: INR: 1.3

## 2011-05-03 MED ORDER — WARFARIN SODIUM 7.5 MG PO TABS: 7.5000 mg | ORAL_TABLET | Freq: Every day | ORAL | Status: DC

## 2011-05-03 NOTE — Progress Notes (Signed)
  Subjective:    Patient ID: Gregory Cortez, male    DOB: [birth year], 55 y.o.   MRN: [id number]  HPI Here to follow up a hospital stay from 04-25-11 to 04-26-11 for recurrent bilateral PEs. He had completed a 6 month course of Coumadin after a leg DVT and PEs in September 2011, and his Coumadin was stopped on 03-03-11 at the advice of Dr. Shirline Frees. He has performed an extensive workup for hypercoagulability which did  not reveal any specific cause. He had been getting testosterone supplementation for a lab proven deficiency, but this was stopped since it was felt to be a potential factor in his hypercoagulable state. He was put on Lovenox and Coumadin again, and of course he will need lifelong anticoagulation from now on. He had serial cardiac enzymes drawn which were negative. He is back home now, taking 5 mg of Coumadin daily. He is actually back to work full time. 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 He still has some mild pains in the right side of his chest, and he is quite SOB on exertion. Before the recurrence of PEs he was able to get 2 ESIs in the lower back per Dr. Ethelene Hal, and these were very successful at relieving almost all of his low back pain and leg pains.    Review of Systems  Constitutional: Negative.   Respiratory: Positive for shortness of breath. Negative for cough, choking and wheezing.   Cardiovascular: Positive for chest pain. Negative for palpitations and leg swelling.       Objective:   Physical Exam  Constitutional: No distress.       Obese, alert  Neck: No thyromegaly present.  Cardiovascular: Normal rate, regular rhythm, normal heart sounds and intact distal pulses.  Exam reveals no gallop and no friction rub.   No murmur heard.      EKG normal  Pulmonary/Chest: Effort normal and breath sounds normal. No respiratory distress. He has no wheezes. He has no rales. He exhibits no tenderness.  Lymphadenopathy:    He has no cervical adenopathy.          Assessment & Plan:  Obviously he will need lifelong anticoagulation, and he realizes this. We will increase his dose of Coumadin to 12.5 mg daily (one 5 and one 7.5), and we will check another INR in one week. Stay on Lovenox until his INR is at least 2.5.

## 2011-05-03 NOTE — Patient Instructions (Signed)
Patient was off for 2 months now.Has been on coumadin for only 1 week

## 2011-05-06 ENCOUNTER — Other Ambulatory Visit: Payer: Self-pay | Admitting: Family Medicine

## 2011-05-06 MED ORDER — ENOXAPARIN SODIUM 150 MG/ML ~~LOC~~ SOLN: 1.0000 mg/kg | Freq: Two times a day (BID) | SUBCUTANEOUS | Status: DC

## 2011-05-06 NOTE — Telephone Encounter (Signed)
Call in Lovenox 150 mg to give subQ twice a day, #20 with no rf

## 2011-05-06 NOTE — Op Note (Signed)
NAME:  Gregory Cortez, Gregory Cortez NO.:  [id number]   MEDICAL RECORD NO.:  [id number]          PATIENT TYPE:  AMB   LOCATION:  ENDO                         FACILITY:  [facility]   PHYSICIAN:  Graylin Shiver, M.D.   DATE OF BIRTH:  10-17-1956   DATE OF PROCEDURE:  11/23/2004  DATE OF DISCHARGE:                                 OPERATIVE REPORT   PROCEDURE:  Esophagogastroduodenoscopy with biopsy for CLOtest.   INDICATION FOR PROCEDURE:  History of heartburn, history of gastroesophageal  reflux, history of intermittent dysphagia.   Informed consent was obtained after explanation of the risks of bleeding,  infection, and perforation.   PREMEDICATION:  Fentanyl 50 mcg IV, Versed 5 mg IV.   PROCEDURE:  With the patient in the left lateral decubitus position, the  Olympus gastroscope was inserted into the oropharynx and passed into the  esophagus.  It was advanced down the esophagus, then into the stomach and  into the duodenum.  The second portion and bulb of the duodenum were normal.  The stomach showed some erythema in the antrum with a few scattered  erosions, also in the antrum.  Biopsy for CLOtest was obtained.  The body of  the stomach looked normal.  There were no lesions seen in the fundus or  cardia on retroflexion.  There was a hiatal hernia.  The esophagogastric  junction was at 37 cm.  There was no evidence of a stricture.  I did obtain  biopsies from the EG junction for histological inspection.  The esophageal  mucosa in the mid- and proximal portions looked normal.  He tolerated the  procedure well without complications.   IMPRESSION:  1.  Hiatal hernia.  2.  Gastritis with a few erosions in the antrum.   PLAN:  The CLOtest will be checked.   COMMENT:  This patient seems to have mostly problems with regurgitation.  He  is extremely overweight, and he has a hiatal hernia.  I have recommended to  him to go on a diet and lose weight.  For any heartburn that he might  have,  he can take over-the-counter Prilosec.  This seems to be less of a problem  than the actual regurgitation.  The patient will follow up with Dr. Cloyde Reams.       SFG/MEDQ  D:  11/23/2004  T:  11/23/2004  Job:  [id number]   cc:   Cloyde Reams, M.D.

## 2011-05-06 NOTE — Telephone Encounter (Signed)
Lovenox 150 mg sent to [facility] [address]; pt aware

## 2011-05-06 NOTE — Telephone Encounter (Signed)
Needs new rx for Lovenox 150mg  (injections0 [facility]. Patient stated that he ran out of medication yesterday.

## 2011-05-06 NOTE — Telephone Encounter (Signed)
Pt called to check on status of getting Lovenox called in. This is urgent. [facility].

## 2011-05-06 NOTE — Op Note (Signed)
NAME:  Gregory Cortez, Gregory Cortez                         ACCOUNT NO.:  [id number]   MEDICAL RECORD NO.:  [id number]                   PATIENT TYPE:  OIB   LOCATION:  3309                                 FACILITY:  [facility]   PHYSICIAN:  Robert A. Thurston Hole, M.D.              DATE OF BIRTH:  11/12/1956   DATE OF PROCEDURE:  08/17/2004  DATE OF DISCHARGE:                                 OPERATIVE REPORT   PREOPERATIVE DIAGNOSES:  1.  Right shoulder rotator cuff tear.  2.  Right shoulder partial labrum tear.  3.  Right shoulder chondromalacia.  4.  Right shoulder chronic biceps tendon rupture.   POSTOPERATIVE DIAGNOSES:  1.  Right shoulder rotator cuff tear.  2.  Right shoulder partial labrum tear.  3.  Right shoulder chondromalacia.  4.  Right shoulder chronic biceps tendon rupture.   1.  Irreparable rotator cuff tear.   PROCEDURES:  1.  Right shoulder examination under anesthesia followed by arthroscopic      debridement irreparable rotator cuff tear.  2.  Right shoulder partial labrum tear debridement.  3.  Right shoulder partial chondroplasty and synovectomy.   SURGEON:  Elana Alm. Thurston Hole, M.D.   ASSISTANT:  Julien Girt, P.A.   ANESTHESIA:  General anesthesia.   OPERATIVE TIME:  45 minutes.   COMPLICATIONS:  None.   INDICATIONS FOR PROCEDURE:  Mr. Thivierge is a 55 year old gentleman who  initially injured his shoulder in February of 2005 with a lifting injury.  He had significant pain with examination and MRI documenting a complete  rotator cuff tear and biceps tendon rupture.  He has failed conservative  care and is now to undergo arthroscopy and possible repair.   DESCRIPTION OF PROCEDURE:  Mr. Lindholm is brought to the operating room on  August 17, 2004, placed on the operating table in the supine position after  an interscalene block had been placed in the holding room by anesthesia.  He  was placed on the operating room in the supine position.  After being placed  under  general anesthesia, his right shoulder was examined.  He had full  range of motion in his shoulder with stable ligamentous examination.  He was  then placed in beach chair position and his shoulder and arm were prepped  using sterile DuraPrep and draped using sterile technique.  Originally  through a posterior arthroscopic portal, the arthroscope with a pump  attachment was placed and through an anterior portal, an arthroscopic probe  was placed.  On initial inspection, the articular cartilage and the  glenohumeral joint showed 25 to 30% grade III chondromalacia which was  debrided.  Anterior labrum partial tearing 25 to 30% which was debrided.  The anterior inferior labrum and anterior glenohumeral ligament complex was  intact.  Superior labrum showed tearing 30 to 40% which was resected.  Biceps tendon was chronically ruptured and the stump was debrided.  Posterior labrum 25 to  30% tearing, this was debrided.  He had significant  synovitis in the shoulder superior, inferior, anterior and posterior which  was partially debrided.  He had complete disruption of supraspinatus and  infraspinatus with significant retraction and this was irreparable.  I  attempted to mobilize this from multiple different portals but this could  not be mobilized.  The torn edges were debrided.  The subscapularis and  teres minor were found to be intact. The humeral head was basically bald  with complete noncoverage of the humeral head and the supraspinatus and  infraspinatus regions.  I elected not to perform a subacromial decompression  in order to not destabilize the shoulder.  After the debridement had been  carried out, shoulder could be brought through a full range of motion.  At  this point, arthroscopic instruments were removed.  Portals closed with 3-0  nylon suture.  Sterile dressings were applied and a sling and the patient  awakened and taken to the recovery room in stable condition.   FOLLOW UP CARE:   Mr. Mccarley will be followed overnight for close observation  due to a history of sleep apnea.  He will be discharged tomorrow if stable.  See him back in the office in a week for sutures out and follow-up.                                               Robert A. Thurston Hole, M.D.    RAW/MEDQ  D:  08/17/2004  T:  08/17/2004  Job:  [id number]

## 2011-05-10 ENCOUNTER — Other Ambulatory Visit (INDEPENDENT_AMBULATORY_CARE_PROVIDER_SITE_OTHER): Payer: 59 | Admitting: Family Medicine

## 2011-05-10 DIAGNOSIS — I2699 Other pulmonary embolism without acute cor pulmonale: Secondary | ICD-10-CM

## 2011-05-10 LAB — POCT INR: INR: 3.6

## 2011-05-10 NOTE — Patient Instructions (Signed)
12.5 mg on mondays and fridays,10 mg on other days,check in 2 weeks 

## 2011-05-24 ENCOUNTER — Ambulatory Visit (INDEPENDENT_AMBULATORY_CARE_PROVIDER_SITE_OTHER): Payer: 59 | Admitting: Family Medicine

## 2011-05-24 DIAGNOSIS — I2699 Other pulmonary embolism without acute cor pulmonale: Secondary | ICD-10-CM

## 2011-05-24 LAB — POCT INR: INR: 3.3

## 2011-05-24 NOTE — Patient Instructions (Signed)
12.5 mg on mondays and fridays,10 mg on other days,check in 2 weeks

## 2011-06-07 ENCOUNTER — Ambulatory Visit (INDEPENDENT_AMBULATORY_CARE_PROVIDER_SITE_OTHER): Payer: 59 | Admitting: Family Medicine

## 2011-06-07 DIAGNOSIS — I2699 Other pulmonary embolism without acute cor pulmonale: Secondary | ICD-10-CM

## 2011-06-07 LAB — POCT INR: INR: 2.9

## 2011-06-07 NOTE — Patient Instructions (Signed)
Same dose 

## 2011-06-20 ENCOUNTER — Other Ambulatory Visit: Payer: Self-pay | Admitting: Family Medicine

## 2011-07-07 ENCOUNTER — Ambulatory Visit: Payer: 59

## 2011-07-08 ENCOUNTER — Ambulatory Visit: Payer: 59 | Admitting: Family Medicine

## 2011-07-08 ENCOUNTER — Telehealth: Payer: Self-pay | Admitting: Family Medicine

## 2011-07-08 DIAGNOSIS — I2699 Other pulmonary embolism without acute cor pulmonale: Secondary | ICD-10-CM

## 2011-07-08 LAB — POCT INR: INR: 2.2

## 2011-07-08 NOTE — Patient Instructions (Signed)
12.5 mg on mondays,wednesdays and fridays,10 mg on other days,check in 2 weeks

## 2011-07-08 NOTE — Telephone Encounter (Signed)
Pt had a thirty day voucher for Cialis 5 mg and would like a [facility] Dr. Clent Ridges approved to call in #30 with 11 refills. Script was called in.

## 2011-07-11 ENCOUNTER — Other Ambulatory Visit: Payer: Self-pay

## 2011-07-11 NOTE — Telephone Encounter (Signed)
rx request for alprazolam 1 mg; Pt last ov 05/03/11 and last cpx 06/16/10. Please advise

## 2011-07-11 NOTE — Telephone Encounter (Signed)
Call in #90 with 5 rf 

## 2011-07-12 NOTE — Telephone Encounter (Signed)
I called in script and pt aware. 

## 2011-07-19 ENCOUNTER — Encounter: Payer: Self-pay | Admitting: Family Medicine

## 2011-07-19 ENCOUNTER — Ambulatory Visit (INDEPENDENT_AMBULATORY_CARE_PROVIDER_SITE_OTHER): Payer: 59 | Admitting: Family Medicine

## 2011-07-19 DIAGNOSIS — M545 Low back pain, unspecified: Secondary | ICD-10-CM

## 2011-07-19 DIAGNOSIS — R079 Chest pain, unspecified: Secondary | ICD-10-CM

## 2011-07-19 DIAGNOSIS — K219 Gastro-esophageal reflux disease without esophagitis: Secondary | ICD-10-CM

## 2011-07-19 DIAGNOSIS — I2699 Other pulmonary embolism without acute cor pulmonale: Secondary | ICD-10-CM

## 2011-07-19 LAB — POCT INR: INR: 2.6

## 2011-07-19 MED ORDER — OMEPRAZOLE 40 MG PO CPDR: 40.0000 mg | DELAYED_RELEASE_CAPSULE | Freq: Two times a day (BID) | ORAL | Status: DC

## 2011-07-19 MED ORDER — CELECOXIB 200 MG PO CAPS: 200.0000 mg | ORAL_CAPSULE | Freq: Every day | ORAL | Status: DC

## 2011-07-19 NOTE — Patient Instructions (Signed)
12.5 mg on mondays,wednesdays and fridays,10 mg on other days,check in 4 weeks

## 2011-07-19 NOTE — Progress Notes (Signed)
  Subjective:    Patient ID: Gregory Cortez, male    DOB: 1956/03/10, 55 y.o.   MRN: [id number]  HPI Here with his wife for the onset of some chest heaviness earlier this morning after breakfast. This was not severe but was uncomfortable. It lasted about an hour and then resolved. At the moment he feels fine. No unusual  SOB or sweats or nausea. He has had more heartburn lately than usual. Of note he was recently started on Celebrex by Dr. Ethelene Hal to help with his back pain. He had a normal stress test 2 years ago, and he had a normal ECHO per Dr. Clifton James on 09-16-10. His Coumadin is therapeutic today with an INR of 2.6. He has gained 9 lbs since I saw him last, and he admits to not exercising at all.   Review of Systems  Constitutional: Negative.   Respiratory: Negative.   Cardiovascular: Negative for palpitations and leg swelling.       Objective:   Physical Exam  Constitutional: He appears well-developed and well-nourished.  Neck: No thyromegaly present.  Cardiovascular: Normal rate, regular rhythm, normal heart sounds and intact distal pulses.  Exam reveals no gallop and no friction rub.   No murmur heard.      EKG normal   Pulmonary/Chest: Effort normal and breath sounds normal. No respiratory distress. He has no wheezes. He has no rales. He exhibits no tenderness.  Lymphadenopathy:    He has no cervical adenopathy.          Assessment & Plan:   I think his chest discomfort is from GERD, and this has probably flared up a bit from taking Celebrex. Stay on the Celebrex, but increase the Omeprazole to 40 mg bid. Recheck in 2 weeks

## 2011-07-23 ENCOUNTER — Other Ambulatory Visit: Payer: Self-pay | Admitting: Family Medicine

## 2011-08-11 ENCOUNTER — Encounter: Payer: Self-pay | Admitting: Cardiovascular Disease

## 2011-08-28 ENCOUNTER — Other Ambulatory Visit: Payer: Self-pay | Admitting: Pulmonary Disease

## 2011-08-28 DIAGNOSIS — G4733 Obstructive sleep apnea (adult) (pediatric): Secondary | ICD-10-CM

## 2011-08-30 ENCOUNTER — Encounter: Payer: Self-pay | Admitting: Cardiovascular Disease

## 2011-08-30 ENCOUNTER — Ambulatory Visit (INDEPENDENT_AMBULATORY_CARE_PROVIDER_SITE_OTHER): Payer: BC Managed Care – PPO | Admitting: Cardiovascular Disease

## 2011-08-30 VITALS — BP 110/84 | HR 76 | Ht 70.0 in | Wt 345.8 lb

## 2011-08-30 DIAGNOSIS — R0602 Shortness of breath: Secondary | ICD-10-CM

## 2011-08-30 DIAGNOSIS — I2699 Other pulmonary embolism without acute cor pulmonale: Secondary | ICD-10-CM

## 2011-08-30 NOTE — Progress Notes (Signed)
History of Present Illness:54 yo WM with history of GERD, OSA here today for cardiac follow up. I saw him as a new patient September 2011. He was previously followed by Dr. Armanda Cortez with [facility]. He Cortez had chest pain in the past and had several stress tests with most recent being 2010. He was told it was normal. In 2011 had a left rotator cuff surgery and developed hiccups. He underwent a CT angiogram and was found to have bilateral PE. He was treated with coumadin but this was stopped in March 2012 and he had recurrence of PE on CTA May 2012.  He Cortez been on coumadin per Dr. Clent Cortez and was recently seen by Dr. Clent Cortez one month ago with c/o chest pain that was felt to be related to GERD.  He wears CPAP for sleep apnea.   He Cortez been doing well from a cardiac standpoint. NO exertional chest pain. He feels occasional palpitations and fluttering. Baseline SOB. His main complaint today is of his back and leg pain.    Past Medical History  Diagnosis Date  . OSA (obstructive sleep apnea)     dr Gregory Cortez  . Testosterone deficiency     dr Gregory Cortez, shots every 2 weeks  . ED (erectile dysfunction)   . GERD (gastroesophageal reflux disease)     [facility]  . Low back pain     dr Gregory Cortez, dr Gregory Cortez, dr Gregory Cortez, herniated disc L4-5  . PE (pulmonary embolism)     bilateral sep 2011 and again bilateral May 2012  . 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 Primary hypercoagulable state     no etiology found per Dr. Shirline Cortez   . Hx of colonoscopy     Past Surgical History  Procedure Date  . Knee surgery     both  . Shoulder surgery     right and left  . Esi     L4-5 dr Gregory Cortez 03/2010  . Rotator cuff repair 08-20-10    left dr Gregory Cortez complicated by PE  . Esophagogastroduodenoscopy     x2 - normal except reflux    Current Outpatient Prescriptions  Medication Sig Dispense Refill  . albuterol (PROAIR HFA) 108 (90 BASE) MCG/ACT inhaler Inhale 2 puffs into the lungs every 6 (six) hours as needed.          . ALPRAZolam (XANAX) 1 MG tablet Take 1 mg by mouth 3 (three) times daily as needed. Twice daily       . celecoxib (CELEBREX) 200 MG capsule Take 1 capsule (200 mg total) by mouth daily.  1 capsule  0  . cetirizine (ZYRTEC) 10 MG tablet Take 10 mg by mouth daily.        [name] Kitchen CIALIS 20 MG tablet TAKE 1 TABLET BY MOUTH AS NEEDED  10 tablet  2  . cyclobenzaprine (FLEXERIL) 10 MG tablet Take 1 tablet (10 mg total) by mouth every 8 (eight) hours as needed for muscle spasms.  60 tablet  5  . furosemide (LASIX) 40 MG tablet TAKE 1 TABLET BY MOUTH TWICE A DAY  60 tablet  5  . methocarbamol (ROBAXIN) 500 MG tablet Take 500 mg by mouth 3 (three) times daily.        . NON FORMULARY cpap        . omeprazole (PRILOSEC) 40 MG capsule Take 1 capsule (40 mg total) by mouth 2 (two) times daily.  60 capsule  11  . oxyCODONE-acetaminophen (  PERCOCET) 10-325 MG per tablet Take 1 tablet by mouth every 4 (four) hours as needed for pain.  100 tablet  0  . pregabalin (LYRICA) 50 MG capsule Take 50 mg by mouth 3 (three) times daily as needed.        . warfarin (COUMADIN) 10 MG tablet Take 10 mg by mouth daily. Take as directed      . warfarin (COUMADIN) 5 MG tablet Take 5 mg by mouth daily.        [name] Kitchen warfarin (COUMADIN) 7.5 MG tablet Take 1 tablet (7.5 mg total) by mouth daily.  30 tablet  11    No Known Allergies  History   Social History  . Marital Status: Married    Spouse Name: N/A    Number of Children: 3  . Years of Education: N/A   Occupational History  . Desk job-petroleum dispatcher    Social History Main Topics  . Smoking status: Never Smoker   . Smokeless tobacco: Not on file  . Alcohol Use: No  . Drug Use: No  . Sexually Active:    Other Topics Concern  . Not on file   Social History Narrative  . No narrative on file    Family History  Problem Relation Age of Onset  . Hypertension Other   . Cancer Other     lung  . Coronary artery disease Neg Hx     Review of Systems:  As stated  in the HPI and otherwise negative.   BP 110/84  Pulse 76  Ht 5\' 10"  (1.778 m)  Wt 345 lb 12.8 oz (156.854 kg)  BMI 49.62 kg/m2  Physical Examination: General: Well developed, well nourished, NAD HEENT: OP clear, mucus membranes moist SKIN: warm, dry. No rashes. Neuro: No focal deficits Musculoskeletal: Muscle strength 5/5 all ext Psychiatric: Cortez and affect normal Neck: No JVD, no carotid bruits, no thyromegaly, no lymphadenopathy. Lungs:Clear bilaterally, no wheezes, rhonci, crackles Cardiovascular: Regular rate and rhythm. No murmurs, gallops or rubs. Abdomen:Soft. Bowel sounds present. Non-tender.  Extremities: No lower extremity edema. Pulses are 2 + in the bilateral DP/PT.  EKG:NSR, rate 76 bpm. Normal EKG.

## 2011-08-30 NOTE — Assessment & Plan Note (Signed)
Continue coumadin. He is scheduled to see Hematology next week.

## 2011-08-30 NOTE — Assessment & Plan Note (Signed)
Likely related to his weight, OSA and Pulm emboli. No evidence of active cardiac issues. EKG is normal.

## 2011-08-30 NOTE — Patient Instructions (Signed)
Your physician wants you to follow-up in:  12 months.  You will receive a reminder letter in the mail two months in advance. If you don't receive a letter, please call our office to schedule the follow-up appointment.   

## 2011-09-01 ENCOUNTER — Other Ambulatory Visit: Payer: Self-pay | Admitting: Hematology & Oncology

## 2011-09-01 ENCOUNTER — Encounter (HOSPITAL_BASED_OUTPATIENT_CLINIC_OR_DEPARTMENT_OTHER): Payer: BC Managed Care – PPO | Admitting: Hematology & Oncology

## 2011-09-01 DIAGNOSIS — G4730 Sleep apnea, unspecified: Secondary | ICD-10-CM

## 2011-09-01 DIAGNOSIS — I808 Phlebitis and thrombophlebitis of other sites: Secondary | ICD-10-CM

## 2011-09-01 DIAGNOSIS — I2699 Other pulmonary embolism without acute cor pulmonale: Secondary | ICD-10-CM

## 2011-09-01 DIAGNOSIS — Z7901 Long term (current) use of anticoagulants: Secondary | ICD-10-CM

## 2011-09-01 DIAGNOSIS — M549 Dorsalgia, unspecified: Secondary | ICD-10-CM

## 2011-09-01 LAB — CBC WITH DIFFERENTIAL (CANCER CENTER ONLY)
BASO#: 0.1 10*3/uL (ref 0.0–0.2)
BASO%: 0.9 % (ref 0.0–2.0)
EOS%: 7.9 % — ABNORMAL HIGH (ref 0.0–7.0)
Eosinophils Absolute: 0.5 10*3/uL (ref 0.0–0.5)
HCT: 42.2 % (ref 38.7–49.9)
HGB: 15.1 g/dL (ref 13.0–17.1)
LYMPH#: 1.4 10*3/uL (ref 0.9–3.3)
LYMPH%: 25.1 % (ref 14.0–48.0)
MCH: 32.3 pg (ref 28.0–33.4)
MCHC: 35.8 g/dL (ref 32.0–35.9)
MCV: 90 fL (ref 82–98)
MONO#: 0.7 10*3/uL (ref 0.1–0.9)
MONO%: 12.9 % (ref 0.0–13.0)
NEUT#: 3.1 10*3/uL (ref 1.5–6.5)
NEUT%: 53.2 % (ref 40.0–80.0)
Platelets: 249 10*3/uL (ref 145–400)
RBC: 4.68 10*6/uL (ref 4.20–5.70)
RDW: 12.4 % (ref 11.1–15.7)
WBC: 5.7 10*3/uL (ref 4.0–10.0)

## 2011-09-01 LAB — PROTIME-INR (CHCC SATELLITE)
INR: 2.7 (ref 2.0–3.5)
Protime: 32.4 Seconds — ABNORMAL HIGH (ref 10.6–13.4)

## 2011-09-03 LAB — D-DIMER, QUANTITATIVE: D-Dimer, Quant: 0.38 ug/mL-FEU (ref 0.00–0.48)

## 2011-09-06 ENCOUNTER — Telehealth: Payer: Self-pay | Admitting: Pulmonary Disease

## 2011-09-06 NOTE — Telephone Encounter (Signed)
I spoke with patient-he is aware our records show yes 16 is the pressure KC would like to have patient at; if he notices the pressure is too much or having concerns with it then he is to call our office and let KC know.

## 2011-09-07 LAB — JAK-2 V617F

## 2011-09-22 ENCOUNTER — Other Ambulatory Visit: Payer: Self-pay | Admitting: Hematology & Oncology

## 2011-09-22 ENCOUNTER — Encounter (HOSPITAL_BASED_OUTPATIENT_CLINIC_OR_DEPARTMENT_OTHER): Payer: BC Managed Care – PPO | Admitting: Hematology & Oncology

## 2011-09-22 DIAGNOSIS — I808 Phlebitis and thrombophlebitis of other sites: Secondary | ICD-10-CM

## 2011-09-22 LAB — PROTIME-INR (CHCC SATELLITE)
INR: 2.4 (ref 2.0–3.5)
Protime: 28.8 Seconds — ABNORMAL HIGH (ref 10.6–13.4)

## 2011-10-03 ENCOUNTER — Other Ambulatory Visit: Payer: Self-pay | Admitting: Hematology & Oncology

## 2011-10-03 ENCOUNTER — Encounter: Payer: BC Managed Care – PPO | Admitting: Hematology & Oncology

## 2011-10-03 DIAGNOSIS — I82290 Acute embolism and thrombosis of other thoracic veins: Secondary | ICD-10-CM

## 2011-10-03 DIAGNOSIS — Z7901 Long term (current) use of anticoagulants: Secondary | ICD-10-CM

## 2011-10-03 DIAGNOSIS — I82609 Acute embolism and thrombosis of unspecified veins of unspecified upper extremity: Secondary | ICD-10-CM

## 2011-10-03 LAB — CBC WITH DIFFERENTIAL (CANCER CENTER ONLY)
BASO#: 0 10*3/uL (ref 0.0–0.2)
BASO%: 0.4 % (ref 0.0–2.0)
EOS%: 0.8 % (ref 0.0–7.0)
Eosinophils Absolute: 0.1 10*3/uL (ref 0.0–0.5)
HCT: 43.3 % (ref 38.7–49.9)
HGB: 14.9 g/dL (ref 13.0–17.1)
LYMPH#: 1.2 10*3/uL (ref 0.9–3.3)
LYMPH%: 13.2 % — ABNORMAL LOW (ref 14.0–48.0)
MCH: 32 pg (ref 28.0–33.4)
MCHC: 34.4 g/dL (ref 32.0–35.9)
MCV: 93 fL (ref 82–98)
MONO#: 0.8 10*3/uL (ref 0.1–0.9)
MONO%: 8.2 % (ref 0.0–13.0)
NEUT#: 7.1 10*3/uL — ABNORMAL HIGH (ref 1.5–6.5)
NEUT%: 77.4 % (ref 40.0–80.0)
Platelets: 241 10*3/uL (ref 145–400)
RBC: 4.66 10*6/uL (ref 4.20–5.70)
RDW: 13.2 % (ref 11.1–15.7)
WBC: 9.2 10*3/uL (ref 4.0–10.0)

## 2011-10-05 LAB — D-DIMER, QUANTITATIVE: D-Dimer, Quant: 0.25 ug/mL-FEU (ref 0.00–0.48)

## 2011-10-05 LAB — HOMOCYSTEINE: Homocysteine: 6 umol/L (ref 4.0–15.4)

## 2011-11-15 ENCOUNTER — Emergency Department (HOSPITAL_COMMUNITY): Payer: BC Managed Care – PPO

## 2011-11-15 ENCOUNTER — Emergency Department (HOSPITAL_COMMUNITY)
Admission: EM | Admit: 2011-11-15 | Discharge: 2011-11-15 | Disposition: A | Discharge status: Home / Self Care | Payer: BC Managed Care – PPO | Attending: Emergency Medicine | Admitting: Emergency Medicine

## 2011-11-15 ENCOUNTER — Encounter (HOSPITAL_COMMUNITY): Payer: Self-pay

## 2011-11-15 DIAGNOSIS — Z7901 Long term (current) use of anticoagulants: Secondary | ICD-10-CM | POA: Insufficient documentation

## 2011-11-15 DIAGNOSIS — M549 Dorsalgia, unspecified: Secondary | ICD-10-CM

## 2011-11-15 DIAGNOSIS — M546 Pain in thoracic spine: Secondary | ICD-10-CM | POA: Insufficient documentation

## 2011-11-15 DIAGNOSIS — K219 Gastro-esophageal reflux disease without esophagitis: Secondary | ICD-10-CM | POA: Insufficient documentation

## 2011-11-15 DIAGNOSIS — Z86718 Personal history of other venous thrombosis and embolism: Secondary | ICD-10-CM | POA: Insufficient documentation

## 2011-11-15 DIAGNOSIS — G4733 Obstructive sleep apnea (adult) (pediatric): Secondary | ICD-10-CM | POA: Insufficient documentation

## 2011-11-15 DIAGNOSIS — R079 Chest pain, unspecified: Secondary | ICD-10-CM | POA: Insufficient documentation

## 2011-11-15 DIAGNOSIS — Z79899 Other long term (current) drug therapy: Secondary | ICD-10-CM | POA: Insufficient documentation

## 2011-11-15 DIAGNOSIS — R059 Cough, unspecified: Secondary | ICD-10-CM | POA: Insufficient documentation

## 2011-11-15 DIAGNOSIS — R05 Cough: Secondary | ICD-10-CM | POA: Insufficient documentation

## 2011-11-15 DIAGNOSIS — R066 Hiccough: Secondary | ICD-10-CM | POA: Insufficient documentation

## 2011-11-15 LAB — PROTIME-INR
INR: 2.53 — ABNORMAL HIGH (ref 0.00–1.49)
Prothrombin Time: 27.7 seconds — ABNORMAL HIGH (ref 11.6–15.2)

## 2011-11-15 LAB — POCT I-STAT, CHEM 8
BUN: 12 mg/dL (ref 6–23)
Calcium, Ion: 1.17 mmol/L (ref 1.12–1.32)
Chloride: 105 mEq/L (ref 96–112)
Creatinine, Ser: 0.8 mg/dL (ref 0.50–1.35)
Glucose, Bld: 92 mg/dL (ref 70–99)
HCT: 42 % (ref 39.0–52.0)
Hemoglobin: 14.3 g/dL (ref 13.0–17.0)
Potassium: 3.8 mEq/L (ref 3.5–5.1)
Sodium: 143 mEq/L (ref 135–145)
TCO2: 25 mmol/L (ref 0–100)

## 2011-11-15 LAB — POCT I-STAT TROPONIN I: Troponin i, poc: 0 ng/mL (ref 0.00–0.08)

## 2011-11-15 MED ORDER — OXYCODONE-ACETAMINOPHEN 5-325 MG PO TABS: 2.0000 | ORAL_TABLET | ORAL | Status: AC | PRN

## 2011-11-15 MED ORDER — IOHEXOL 350 MG/ML SOLN
100.0000 mL | Freq: Once | INTRAVENOUS | Status: AC | PRN
  Administered 2011-11-15: 100 mL via INTRAVENOUS

## 2011-11-15 NOTE — ED Provider Notes (Cosign Needed)
History     CSN: [id number] Arrival date & time: 11/15/2011 10:34 AM   First MD Initiated Contact with Patient 11/15/11 1056      Chief Complaint  Patient presents with  . Chest Pain    HX of blood clots   gentleman with multiple medical problems including previous pulmonary embolisms, recurrent DVT, obstructive sleep apnea, obesity, testosterone deficiency among other medical problems. Her chest, right upper back pain beginning yesterday. Pain mostly centered in the right upper back with some pain along the lateral portion. Minimal pain and tenderness in the right upper chest wall. He states the pain is much worse with deep inspiration and also feels he cannot get a deep breath. Patient states it feels very similar to when he had his previous pulmonary embolism. He does admit he has not had his INR checked in 2 months, but has been on Coumadin. He has had no documented fevers but feels hot and cold. Patient also reports increasing hiccups, which he states he had with his previous pulmonary embolism. He has had no nausea or vomiting. No fevers, mild, nonproductive cough, which is mostly chronic. He denies any leg pain or any recent period of sedentary activity.  (Consider location/radiation/quality/duration/timing/severity/associated sxs/prior treatment) HPI  Past Medical History  Diagnosis Date  . OSA (obstructive sleep apnea)     dr Shelle Iron  . Testosterone deficiency     dr Patsi Sears, shots every 2 weeks  . ED (erectile dysfunction)   . GERD (gastroesophageal reflux disease)     [facility]  . Low back pain     dr Channing Mutters, dr Farris Has, dr Ethelene Hal, herniated disc L4-5  . PE (pulmonary embolism)     bilateral sep 2011 and again bilateral May 2012  . 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 Primary hypercoagulable state     no etiology found per Dr. Shirline Frees   . Hx of colonoscopy     Past Surgical History  Procedure Date  . Knee surgery     both  . Shoulder surgery     right and left    . Esi     L4-5 dr Channing Mutters 03/2010  . Rotator cuff repair 08-20-10    left dr Wyline Mood complicated by PE  . Esophagogastroduodenoscopy     x2 - normal except reflux    Family History  Problem Relation Age of Onset  . Hypertension Other   . Cancer Other     lung  . Coronary artery disease Neg Hx     History  Substance Use Topics  . Smoking status: Never Smoker   . Smokeless tobacco: Not on file  . Alcohol Use: No      Review of Systems  All other systems reviewed and are negative.    Allergies  Review of patient's allergies indicates no known allergies.  Home Medications   Current Outpatient Rx  Name Route Sig Dispense Refill  . ALPRAZOLAM 1 MG PO TABS Oral Take 1 mg by mouth 3 (three) times daily as needed. Twice daily     . FOLIC ACID 1 MG PO TABS Oral Take 1 mg by mouth daily.      [name] Kitchen OMEPRAZOLE 40 MG PO CPDR Oral Take 40 mg by mouth daily.      . WARFARIN SODIUM 5 MG PO TABS Oral Take 10-12.5 mg by mouth daily. Patient takes 12.5 mg on Monday Wednesday and Friday (7.5 mg tablet + 5 mg tablet) 10 mg all  other days (5mg  tablet x2)       BP 138/77  Pulse 65  Temp(Src) 97.7 F (36.5 C) (Oral)  Resp 18  Ht 5' 10.5" (1.791 m)  Wt 350 lb (158.759 kg)  BMI 49.51 kg/m2  SpO2 96%  Physical Exam  Constitutional: He is oriented to person, place, and time. He appears well-developed and well-nourished.  HENT:  Head: Normocephalic and atraumatic.  Eyes: Conjunctivae and EOM are normal. Pupils are equal, round, and reactive to light.  Neck: Neck supple.  Cardiovascular: Normal rate and regular rhythm.  Exam reveals no gallop and no friction rub.   No murmur heard. Pulmonary/Chest: Breath sounds normal. He has no wheezes. He has no rales. He exhibits no tenderness.  Abdominal: Soft. Bowel sounds are normal. He exhibits no distension. There is no tenderness. There is no rebound and no guarding.  Musculoskeletal: Normal range of motion. He exhibits no edema and no tenderness.   Neurological: He is alert and oriented to person, place, and time. No cranial nerve deficit. Coordination normal.  Skin: Skin is warm and dry. No rash noted.  Psychiatric: He has a normal mood and affect.    ED Course  Procedures (including critical care time)  Labs Reviewed  PROTIME-INR - Abnormal; Notable for the following:    Prothrombin Time 27.7 (*)    INR 2.53 (*)    All other components within normal limits  POCT I-STAT, CHEM 8  POCT I-STAT TROPONIN I  I-STAT TROPONIN I  I-STAT, CHEM 8   Dg Chest 2 View  11/15/2011  *RADIOLOGY REPORT*  Clinical Data: Normal chest pain  CHEST - 2 VIEW  Comparison: 08/25/2010  Findings: Mild cardiomegaly.  No confluent opacities, effusions or overt edema.  Degenerative changes in the thoracic spine.  IMPRESSION: Cardiomegaly.  No active disease.  Original Report Authenticated By: Cyndie Chime, M.D.   Ct Angio Chest W/cm &/or Wo Cm  11/15/2011  *RADIOLOGY REPORT*  Clinical Data:  Right chest and back pleuritic chest pain.  History of pulmonary embolism in the past.  Shortness of breath.  CT ANGIOGRAPHY CHEST WITH CONTRAST  Technique:  Multidetector CT imaging of the chest was performed using the standard protocol during bolus administration of intravenous contrast.  Multiplanar CT image reconstructions including MIPs were obtained to evaluate the vascular anatomy.  Contrast: OMNIPAQUE IOHEXOL 350 MG/ML IV SOLN  Comparison:  Chest radiographs obtained earlier today and previous chest CTA examinations, the most recent dated 04/25/2011.  Findings:  The pulmonary arteries are suboptimally opacified.  No pulmonary arterial filling defects are seen at this time.  The lungs are clear.  A moderate-sized hiatal hernia is again demonstrated.  Unremarkable upper abdomen.  Thoracic spine degenerative changes.  Review of the MIP images confirms the above findings.  IMPRESSION:  1.  Suboptimally opacified pulmonary arteries with no gross pulmonary emboli  seen. 2.  Stable moderate sized hiatal hernia.  Original Report Authenticated By: Darrol Angel, M.D.     No diagnosis found.    MDM  Pt is seen and examined;  Initial history and physical completed.  Will follow.    Date: 11/15/2011  Rate: 70  Rhythm: normal sinus rhythm  QRS Axis: normal  Intervals: normal  ST/T Wave abnormalities: normal  Conduction Disutrbances:none  Narrative Interpretation:   Old EKG Reviewed: none available             Estefana Taylor A. Patrica Duel, MD 11/15/11 1100  Rane Blitch A. Patrica Duel, MD 11/15/11 1122  1:17 PM Remains stable. Troponin was 0.0, INR was therapeutic at 2.5. 3. Electrolyte panel renal panel was normal. Hemoglobin was normal.  CT angiogram of the chest showed no gross pulmonary emboli seen, stable. Moderate-sized hiatal hernia.  The patient will otherwise be stable for discharge at this time. He is given pain medication prescription as he feels he may have actually injured his upper back, lifting a Christmas tree. He is in no distress. He does have followup with Dr. Clent Ridges in [place] followup later this week. He was cautioned to return to the ER for any changing symptoms such as increasing shortness of breath or chest pain or fever et Karie Soda. Otherwise, with negative CT angiogram will be stable for discharge.   Lamekia Nolden A. Patrica Duel, MD 11/15/11 1329  Theron Arista A. Patrica Duel, MD 11/15/11 1331

## 2011-11-15 NOTE — ED Notes (Signed)
Rt. Side of chest pain began yesterday, radiates into his back  Pt. Becomes sob. Denies any n/v. Skin is w/d resp. E/u

## 2011-11-23 ENCOUNTER — Telehealth: Payer: Self-pay | Admitting: Family Medicine

## 2011-11-23 NOTE — Telephone Encounter (Signed)
Pt called and said that he has a cough and chest congestion. Pt is req a zpak to be called in to [facility] on [address].

## 2011-11-24 ENCOUNTER — Ambulatory Visit (INDEPENDENT_AMBULATORY_CARE_PROVIDER_SITE_OTHER): Payer: BC Managed Care – PPO | Admitting: Family Medicine

## 2011-11-24 ENCOUNTER — Encounter: Payer: Self-pay | Admitting: Family Medicine

## 2011-11-24 VITALS — BP 128/80 | HR 77 | Temp 98.8°F | Wt 355.0 lb

## 2011-11-24 DIAGNOSIS — I2699 Other pulmonary embolism without acute cor pulmonale: Secondary | ICD-10-CM

## 2011-11-24 DIAGNOSIS — J40 Bronchitis, not specified as acute or chronic: Secondary | ICD-10-CM

## 2011-11-24 DIAGNOSIS — Z7901 Long term (current) use of anticoagulants: Secondary | ICD-10-CM

## 2011-11-24 LAB — POCT INR: INR: 2.5

## 2011-11-24 MED ORDER — AZITHROMYCIN 250 MG PO TABS: ORAL_TABLET | ORAL | Status: AC

## 2011-11-24 NOTE — Patient Instructions (Signed)
  Latest dosing instructions   Total Sun Mon Tue Wed Thu Fri Sat   55 10 mg 5 mg 10 mg 5 mg 10 mg 5 mg 10 mg    (5 mg2) (5 mg1) (5 mg2) (5 mg1) (5 mg2) (5 mg1) (5 mg2)    Alt Week 22.5  7.5 mg  7.5 mg  7.5 mg      (7.5 mg1)  (7.5 mg1)  (7.5 mg1)

## 2011-11-24 NOTE — Telephone Encounter (Signed)
Spoke with pt

## 2011-11-24 NOTE — Telephone Encounter (Signed)
He needs an OV  

## 2011-11-24 NOTE — Progress Notes (Signed)
  Subjective:    Patient ID: Gregory Cortez, male    DOB: [birth year], 55 y.o.   MRN: [id number]  HPI Here for one week of chest congestion and coughing up yellow sputum. No fever or SOB.    Review of Systems  Constitutional: Negative.   HENT: Negative.   Eyes: Negative.   Respiratory: Positive for cough.   Cardiovascular: Negative for chest pain.       Objective:   Physical Exam  Constitutional: He appears well-developed and well-nourished.  HENT:  Right Ear: External ear normal.  Left Ear: External ear normal.  Nose: Nose normal.  Mouth/Throat: Oropharynx is clear and moist. No oropharyngeal exudate.  Eyes: Conjunctivae are normal.  Pulmonary/Chest: Effort normal and breath sounds normal.  Lymphadenopathy:    He has no cervical adenopathy.          Assessment & Plan:  Add Mucinex

## 2011-12-16 ENCOUNTER — Telehealth: Payer: Self-pay | Admitting: Family Medicine

## 2011-12-16 MED ORDER — CELECOXIB 200 MG PO CAPS: 200.0000 mg | ORAL_CAPSULE | Freq: Every day | ORAL | Status: DC

## 2011-12-16 NOTE — Telephone Encounter (Signed)
Script was sent e-scribe 

## 2011-12-27 ENCOUNTER — Other Ambulatory Visit: Payer: Self-pay | Admitting: *Deleted

## 2011-12-27 DIAGNOSIS — I2699 Other pulmonary embolism without acute cor pulmonale: Secondary | ICD-10-CM

## 2011-12-27 MED ORDER — FONDAPARINUX SODIUM 10 MG/0.8ML ~~LOC~~ SOLN: 10.0000 mg | SUBCUTANEOUS | Status: DC

## 2011-12-27 NOTE — Telephone Encounter (Signed)
Pt called stating he is due to have another injection by Dr Colbert Ewing either on the 15th or 22nd.  He said it would depend when he could get restarted on the injections. Reviewed schedule from last time. Per Dr Myna Hidalgo to stop Coumadin 5 days prior to the procedure. Don't take anything the day the Coumadin is stopped. Start Arixtra next day x 4 days total (hold day of procedure); resume the day after the procedure and continue until INR checked 5 days later Resume Coumadin night of procedure. Pt to call when his picks up his Arixtra so a definite schedule can be made. Will have Arixtra sent to his pharmacy via eprescribe.

## 2011-12-30 ENCOUNTER — Telehealth: Payer: Self-pay | Admitting: Hematology & Oncology

## 2011-12-30 ENCOUNTER — Encounter: Payer: Self-pay | Admitting: *Deleted

## 2011-12-30 DIAGNOSIS — I2699 Other pulmonary embolism without acute cor pulmonale: Secondary | ICD-10-CM

## 2011-12-30 NOTE — Telephone Encounter (Signed)
Per order to sch lab apt for 01/10/12.  i called pt and gave apt date/time.  Pt is aware of apt

## 2011-12-30 NOTE — Progress Notes (Signed)
Instructions for Epidural Injections and anticoagulation: Dr. Ethelene Hal appt 01/03/12  Stop 1/10 Coumadin Start Arixtra 10 mg 1/11 - 1/14 (hold 1/15); resume 1/16 and continue until INR checked on 1/22 Resume Coumadin 1/15  Reviewed these instructions with the pt and verbalized understanding. The rx for Arixtra was sent to the pharmacy on 12/26/11.

## 2012-01-02 ENCOUNTER — Ambulatory Visit: Payer: BC Managed Care – PPO

## 2012-01-03 ENCOUNTER — Ambulatory Visit: Payer: BC Managed Care – PPO | Admitting: *Deleted

## 2012-01-03 DIAGNOSIS — I2699 Other pulmonary embolism without acute cor pulmonale: Secondary | ICD-10-CM

## 2012-01-03 DIAGNOSIS — Z7901 Long term (current) use of anticoagulants: Secondary | ICD-10-CM

## 2012-01-03 DIAGNOSIS — Z5181 Encounter for therapeutic drug level monitoring: Secondary | ICD-10-CM

## 2012-01-03 LAB — POCT INR: INR: 1.3

## 2012-01-05 ENCOUNTER — Telehealth: Payer: Self-pay | Admitting: *Deleted

## 2012-01-05 MED ORDER — WARFARIN SODIUM 5 MG PO TABS: ORAL_TABLET | ORAL | Status: DC

## 2012-01-05 NOTE — Telephone Encounter (Signed)
Refill on warfarin 

## 2012-01-09 ENCOUNTER — Telehealth: Payer: Self-pay | Admitting: Family Medicine

## 2012-01-09 NOTE — Telephone Encounter (Signed)
Refill request for Alprazolam 1 mg take 1 po tid prn and pt last here on 11/24/11. 

## 2012-01-09 NOTE — Telephone Encounter (Signed)
Call in 6 month supply  

## 2012-01-10 ENCOUNTER — Other Ambulatory Visit: Payer: BC Managed Care – PPO | Admitting: Lab

## 2012-01-10 ENCOUNTER — Telehealth: Payer: Self-pay | Admitting: Hematology & Oncology

## 2012-01-10 LAB — CBC WITH DIFFERENTIAL (CANCER CENTER ONLY)
BASO#: 0.1 10*3/uL (ref 0.0–0.2)
BASO%: 0.7 % (ref 0.0–2.0)
EOS%: 3 % (ref 0.0–7.0)
Eosinophils Absolute: 0.2 10*3/uL (ref 0.0–0.5)
HCT: 43.7 % (ref 38.7–49.9)
HGB: 14.7 g/dL (ref 13.0–17.1)
LYMPH#: 1.2 10*3/uL (ref 0.9–3.3)
LYMPH%: 17.7 % (ref 14.0–48.0)
MCH: 31.5 pg (ref 28.0–33.4)
MCHC: 33.6 g/dL (ref 32.0–35.9)
MCV: 94 fL (ref 82–98)
MONO#: 0.6 10*3/uL (ref 0.1–0.9)
MONO%: 9.1 % (ref 0.0–13.0)
NEUT#: 4.9 10*3/uL (ref 1.5–6.5)
NEUT%: 69.5 % (ref 40.0–80.0)
Platelets: 245 10*3/uL (ref 145–400)
RBC: 4.67 10*6/uL (ref 4.20–5.70)
RDW: 12.7 % (ref 11.1–15.7)
WBC: 7 10*3/uL (ref 4.0–10.0)

## 2012-01-10 LAB — PROTIME-INR (CHCC SATELLITE)
INR: 2.2 (ref 2.0–3.5)
Protime: 26.4 Seconds — ABNORMAL HIGH (ref 10.6–13.4)

## 2012-01-10 MED ORDER — ALPRAZOLAM 1 MG PO TABS: 1.0000 mg | ORAL_TABLET | Freq: Three times a day (TID) | ORAL | Status: DC | PRN

## 2012-01-10 NOTE — Telephone Encounter (Signed)
Script called in

## 2012-01-10 NOTE — Telephone Encounter (Signed)
Pt called and cx 01/11/12 apt and resch for 02/10/12

## 2012-01-11 ENCOUNTER — Encounter: Payer: Self-pay | Admitting: Family Medicine

## 2012-01-11 ENCOUNTER — Ambulatory Visit: Payer: BC Managed Care – PPO | Admitting: Hematology & Oncology

## 2012-01-11 ENCOUNTER — Ambulatory Visit (INDEPENDENT_AMBULATORY_CARE_PROVIDER_SITE_OTHER): Payer: BC Managed Care – PPO | Admitting: Family Medicine

## 2012-01-11 VITALS — BP 126/78 | HR 74 | Temp 98.3°F

## 2012-01-11 DIAGNOSIS — J329 Chronic sinusitis, unspecified: Secondary | ICD-10-CM

## 2012-01-11 LAB — HOMOCYSTEINE: Homocysteine: 11.2 umol/L (ref 4.0–15.4)

## 2012-01-11 LAB — D-DIMER, QUANTITATIVE (NOT AT ARMC): D-Dimer, Quant: 0.33 ug/mL-FEU (ref 0.00–0.48)

## 2012-01-11 MED ORDER — AZITHROMYCIN 250 MG PO TABS: ORAL_TABLET | ORAL | Status: AC

## 2012-01-11 NOTE — Progress Notes (Signed)
  Subjective:    Patient ID: Gregory Cortez, male    DOB: 1956-10-05, 56 y.o.   MRN: [id number]  HPI Here for 3 days of sinus pressure, PND, ST, and a dry cough. No fever   Review of Systems  Constitutional: Negative.   HENT: Positive for congestion, postnasal drip and sinus pressure.   Eyes: Negative.   Respiratory: Positive for cough.        Objective:   Physical Exam  Constitutional: He appears well-developed and well-nourished.  HENT:  Right Ear: External ear normal.  Left Ear: External ear normal.  Nose: Nose normal.  Mouth/Throat: Oropharynx is clear and moist. No oropharyngeal exudate.  Eyes: Conjunctivae are normal.  Pulmonary/Chest: Effort normal. No respiratory distress. He has no wheezes. He has no rales. He exhibits no tenderness.  Lymphadenopathy:    He has no cervical adenopathy.          Assessment & Plan:  Use Mucinex prn

## 2012-02-10 ENCOUNTER — Other Ambulatory Visit (HOSPITAL_BASED_OUTPATIENT_CLINIC_OR_DEPARTMENT_OTHER): Payer: BC Managed Care – PPO | Admitting: Lab

## 2012-02-10 ENCOUNTER — Ambulatory Visit (HOSPITAL_BASED_OUTPATIENT_CLINIC_OR_DEPARTMENT_OTHER): Payer: BC Managed Care – PPO | Admitting: Hematology & Oncology

## 2012-02-10 DIAGNOSIS — I2699 Other pulmonary embolism without acute cor pulmonale: Secondary | ICD-10-CM

## 2012-02-10 DIAGNOSIS — Z7901 Long term (current) use of anticoagulants: Secondary | ICD-10-CM

## 2012-02-10 LAB — PROTIME-INR (CHCC SATELLITE)
INR: 2.7 (ref 2.0–3.5)
Protime: 32.4 Seconds — ABNORMAL HIGH (ref 10.6–13.4)

## 2012-02-10 NOTE — Progress Notes (Signed)
CC:   Gregory Cortez. Gregory Ridges, MD Gregory Cortez. Gregory Cortez, M.D. Gregory Carrow, MD  DIAGNOSIS:  Recurrent pulmonary embolism, idiopathic.  CURRENT THERAPY:  Coumadin, lifelong.  INTERIM HISTORY:  Gregory Cortez comes in for followup.  Gregory Cortez is doing better. Gregory Cortez says that the epidural steroids that Gregory Cortez received from Dr. Ethelene Cortez have helped.  Gregory Cortez has now going to go get a trainer and try to exercise more and lose weight.  Gregory Cortez says Gregory Cortez does not mind having Gregory Cortez Coumadin checked once a month.  This works pretty well for him.  Gregory Cortez is on folic acid.  When we did check Gregory Cortez homocystine level, this was up slightly.  Otherwise, Gregory Cortez hypercoagulable studies have all been negative.  Gregory Cortez JAK2 assay also was negative.  Gregory Cortez has not noted any problems with bleeding.  Gregory Cortez has had a good appetite.  Gregory Cortez is trying to watch what Gregory Cortez eats.  There has been no change in bowel or bladder habits.  Gregory Cortez has not noted any leg swelling.  Gregory Cortez has had no bruising.  PHYSICAL EXAMINATION:  This is an obese white gentleman in no obvious distress.  Vital signs:  Temperature 96.6, pulse 73, respiratory rate 18, blood pressure 128/78.  Weight is 366.  Head and neck:  Shows a normocephalic, atraumatic skull.  There are no ocular or oral lesions. There are no palpable cervical or supraclavicular lymph nodes.  Lungs: Clear bilaterally.  Cardiac:  Regular rate and rhythm with a normal S1 and S2.  There are no murmurs or rubs, or bruits.  Abdomen:  Soft with good bowel sounds.  There is no palpable abdominal mass.  There is no fluid wave.  There is no palpable hepatosplenomegaly.  Extremities: Show minimal pitting edema in Gregory Cortez lower legs.  I do not see any stasis dermatitis changes.  Neurologic:  No focal neurological deficits.  LABORATORY STUDIES:  INR of 2.7.  IMPRESSION:  Gregory Cortez is a really nice 56 year old gentleman with recurrent pulmonary embolism.  Gregory Cortez is on Coumadin now.  We will just keep him on Coumadin.  I told him that we could  always switch him over to one of the newer anticoagulants if Coumadin ever became a problem for him.  I think we can probably get him back in 3 months from now for followup.    ______________________________ Josph Macho, M.D. PRE/MEDQ  D:  02/10/2012  T:  02/10/2012  Job:  [id number]

## 2012-02-10 NOTE — Progress Notes (Signed)
This office note has been dictated.

## 2012-03-13 ENCOUNTER — Other Ambulatory Visit: Payer: Self-pay | Admitting: Family Medicine

## 2012-04-05 ENCOUNTER — Ambulatory Visit (INDEPENDENT_AMBULATORY_CARE_PROVIDER_SITE_OTHER): Payer: BC Managed Care – PPO | Admitting: Family Medicine

## 2012-04-05 DIAGNOSIS — I2699 Other pulmonary embolism without acute cor pulmonale: Secondary | ICD-10-CM

## 2012-04-05 LAB — POCT INR: INR: 2.7

## 2012-04-05 NOTE — Patient Instructions (Signed)
  Latest dosing instructions   Total Sun Mon Tue Wed Thu Fri Sat   55 10 mg 5 mg 10 mg 5 mg 10 mg 5 mg 10 mg    (5 mg2) (5 mg1) (5 mg2) (5 mg1) (5 mg2) (5 mg1) (5 mg2)    Alt Week 22.5  7.5 mg  7.5 mg  7.5 mg      (7.5 mg1)  (7.5 mg1)  (7.5 mg1)     

## 2012-04-24 ENCOUNTER — Ambulatory Visit (HOSPITAL_BASED_OUTPATIENT_CLINIC_OR_DEPARTMENT_OTHER): Payer: BC Managed Care – PPO | Admitting: Hematology & Oncology

## 2012-04-24 ENCOUNTER — Other Ambulatory Visit (HOSPITAL_BASED_OUTPATIENT_CLINIC_OR_DEPARTMENT_OTHER): Payer: BC Managed Care – PPO | Admitting: Lab

## 2012-04-24 VITALS — BP 121/75 | HR 67 | Temp 97.0°F | Ht 70.0 in | Wt 347.0 lb

## 2012-04-24 DIAGNOSIS — I2699 Other pulmonary embolism without acute cor pulmonale: Secondary | ICD-10-CM

## 2012-04-24 LAB — CBC WITH DIFFERENTIAL (CANCER CENTER ONLY)
BASO#: 0.1 10*3/uL (ref 0.0–0.2)
BASO%: 0.9 % (ref 0.0–2.0)
EOS%: 5.4 % (ref 0.0–7.0)
Eosinophils Absolute: 0.3 10*3/uL (ref 0.0–0.5)
HCT: 44.2 % (ref 38.7–49.9)
HGB: 15.2 g/dL (ref 13.0–17.1)
LYMPH#: 1.2 10*3/uL (ref 0.9–3.3)
LYMPH%: 20.9 % (ref 14.0–48.0)
MCH: 31.6 pg (ref 28.0–33.4)
MCHC: 34.4 g/dL (ref 32.0–35.9)
MCV: 92 fL (ref 82–98)
MONO#: 0.6 10*3/uL (ref 0.1–0.9)
MONO%: 9.9 % (ref 0.0–13.0)
NEUT#: 3.5 10*3/uL (ref 1.5–6.5)
NEUT%: 62.9 % (ref 40.0–80.0)
Platelets: 247 10*3/uL (ref 145–400)
RBC: 4.81 10*6/uL (ref 4.20–5.70)
RDW: 12.9 % (ref 11.1–15.7)
WBC: 5.6 10*3/uL (ref 4.0–10.0)

## 2012-04-24 LAB — PROTIME-INR (CHCC SATELLITE)
INR: 2.4 (ref 2.0–3.5)
Protime: 28.8 Seconds — ABNORMAL HIGH (ref 10.6–13.4)

## 2012-04-24 LAB — D-DIMER, QUANTITATIVE: D-Dimer, Quant: 0.46 ug/mL-FEU (ref 0.00–0.48)

## 2012-04-24 NOTE — Progress Notes (Signed)
This office note has been dictated.

## 2012-04-25 NOTE — Progress Notes (Signed)
CC:   Tera Mater. Clent Ridges, MD Caralyn Guile. Ethelene Hal, M.D. Verne Carrow, MD  DIAGNOSIS:  Idiopathic pulmonary embolism, recurrent.  CURRENT THERAPY:  Coumadin 12.5 mg p.o. daily alternating with 10 mg p.o. daily.  INTERIM HISTORY:  Gregory Cortez comes in for followup.  He is looking better and better.  He has lost about 20 pounds.  He is exercising.  He has a Systems analyst.  He is on folic acid.  His homocysteine level, when we checked it, was up a little bit.  He has had no problems bleeding-wise.  He did have the epidural steroids that did help him out.  He had no problems of bleeding from this.  He has not noted any problems with nausea or vomiting.  He has had no fever, sweats, or chills.  He has had some slight right ankle discomfort.  This is on the lateral malleolus area.  PHYSICAL EXAMINATION:  General:  This is an obese white gentleman in no obvious distress.  Vital Signs:  Show a temperature of 97.6, pulse 67, respiratory rate 22, blood pressure 121/75, weight is 347.  Head and Neck Exam:  Shows a normocephalic, atraumatic skull.  There are no ocular or oral lesions.  There are no palpable cervical or supraclavicular lymph nodes.  Lungs:  Clear bilaterally.  He has no rales, wheezes, or rhonchi.  Cardiac Exam:  Regular rate and rhythm with a normal S1 and S2.  There are no murmurs, rubs, or bruits.  Abdominal Exam:  Soft with good bowel sounds.  There is no palpable abdominal mass.  There is no fluid wave.  There is no palpable hepatosplenomegaly. He is morbidly obese.  Back Exam:  Shows no tenderness over the spine, ribs, or hips.  Extremities:  Show no clubbing, cyanosis, or edema. Neurological Exam:  Shows no focal neurological deficits.  LABORATORY STUDIES:  White cell count is 5.6, hemoglobin 15.2, hematocrit 44.2, platelet count 247.  INR is 2.7.  IMPRESSION:  Gregory Cortez is a nice 56 year old white gentleman with a history of recurrent pulmonary embolism.  We  did a hypercoagulable workup on him.  Everything looked okay.  His homocysteine level was slightly up, which is why he is on the folic acid.  We will plan to get him back now in about 4 months.  In all honesty, I think if everything looks good in 4 months, then we can probably let him go, as we would not help him out much with respect to his overall healthcare.    ______________________________ Josph Macho, M.D. PRE/MEDQ  D:  04/24/2012  T:  04/24/2012  Job:  [id number]

## 2012-04-30 ENCOUNTER — Other Ambulatory Visit: Payer: Self-pay | Admitting: Family Medicine

## 2012-05-22 ENCOUNTER — Telehealth: Payer: Self-pay | Admitting: Family

## 2012-05-22 ENCOUNTER — Ambulatory Visit (INDEPENDENT_AMBULATORY_CARE_PROVIDER_SITE_OTHER): Payer: BC Managed Care – PPO | Admitting: Family

## 2012-05-22 DIAGNOSIS — I2699 Other pulmonary embolism without acute cor pulmonale: Secondary | ICD-10-CM

## 2012-05-22 LAB — POCT INR: INR: 2.4

## 2012-05-22 NOTE — Patient Instructions (Signed)
  Latest dosing instructions   Total Sun Mon Tue Wed Thu Fri Sat   77.5 10 mg 12.5 mg 10 mg 12.5 mg 10 mg 12.5 mg 10 mg    (5 mg2) (5 mg2.5) (5 mg2) (5 mg2.5) (5 mg2) (5 mg2.5) (5 mg2)        

## 2012-05-22 NOTE — Telephone Encounter (Signed)
Disregard message. I changed patient's INR goal. Therefore he is in normal range

## 2012-05-22 NOTE — Telephone Encounter (Signed)
Please advise patient to take extra 2.5mg  of Coumadin today. He was.1 of a point low today in the Coumadin clinic. Then take coumadin as usual.

## 2012-05-28 ENCOUNTER — Other Ambulatory Visit: Payer: Self-pay | Admitting: Family Medicine

## 2012-05-28 NOTE — Telephone Encounter (Signed)
Can we refill and if so then is this the correct dose?

## 2012-05-28 NOTE — Telephone Encounter (Signed)
This should go to [facility]

## 2012-06-19 ENCOUNTER — Encounter: Payer: BC Managed Care – PPO | Admitting: Family

## 2012-06-22 ENCOUNTER — Telehealth: Payer: Self-pay | Admitting: Family

## 2012-06-22 NOTE — Telephone Encounter (Signed)
Please advise patient that he needs to have INR drawn. Overdue!

## 2012-06-22 NOTE — Telephone Encounter (Signed)
Called pt and schd him for INR draw on 06/25/12 at 2:40pm as noted.

## 2012-06-25 ENCOUNTER — Ambulatory Visit (INDEPENDENT_AMBULATORY_CARE_PROVIDER_SITE_OTHER): Payer: BC Managed Care – PPO | Admitting: Family

## 2012-06-25 DIAGNOSIS — I2699 Other pulmonary embolism without acute cor pulmonale: Secondary | ICD-10-CM

## 2012-06-25 LAB — POCT INR: INR: 2.1

## 2012-06-25 NOTE — Patient Instructions (Addendum)
12.5 mg on mondays,wednesdays and fridays,10 mg on other days,check in 6  Weeks    Latest dosing instructions   Total Sun Mon Tue Wed Thu Fri Sat   77.5 10 mg 12.5 mg 10 mg 12.5 mg 10 mg 12.5 mg 10 mg    (5 mg2) (5 mg2.5) (5 mg2) (5 mg2.5) (5 mg2) (5 mg2.5) (5 mg2)

## 2012-07-03 ENCOUNTER — Other Ambulatory Visit: Payer: Self-pay | Admitting: Family Medicine

## 2012-07-03 NOTE — Telephone Encounter (Signed)
Can we refill this? 

## 2012-07-25 ENCOUNTER — Emergency Department (HOSPITAL_COMMUNITY)
Admission: EM | Admit: 2012-07-25 | Discharge: 2012-07-26 | Disposition: A | Discharge status: Home / Self Care | Payer: BC Managed Care – PPO | Attending: Emergency Medicine | Admitting: Emergency Medicine

## 2012-07-25 ENCOUNTER — Encounter (HOSPITAL_COMMUNITY): Payer: Self-pay | Admitting: Emergency Medicine

## 2012-07-25 DIAGNOSIS — M79669 Pain in unspecified lower leg: Secondary | ICD-10-CM

## 2012-07-25 DIAGNOSIS — M79609 Pain in unspecified limb: Secondary | ICD-10-CM | POA: Insufficient documentation

## 2012-07-25 DIAGNOSIS — Z86718 Personal history of other venous thrombosis and embolism: Secondary | ICD-10-CM | POA: Insufficient documentation

## 2012-07-25 DIAGNOSIS — G4733 Obstructive sleep apnea (adult) (pediatric): Secondary | ICD-10-CM | POA: Insufficient documentation

## 2012-07-25 DIAGNOSIS — K219 Gastro-esophageal reflux disease without esophagitis: Secondary | ICD-10-CM | POA: Insufficient documentation

## 2012-07-25 DIAGNOSIS — Z86711 Personal history of pulmonary embolism: Secondary | ICD-10-CM | POA: Insufficient documentation

## 2012-07-25 DIAGNOSIS — R791 Abnormal coagulation profile: Secondary | ICD-10-CM

## 2012-07-25 DIAGNOSIS — Z7901 Long term (current) use of anticoagulants: Secondary | ICD-10-CM | POA: Insufficient documentation

## 2012-07-25 LAB — CBC
HCT: 41.5 % (ref 39.0–52.0)
Hemoglobin: 14.1 g/dL (ref 13.0–17.0)
MCH: 30.9 pg (ref 26.0–34.0)
MCHC: 34 g/dL (ref 30.0–36.0)
MCV: 91 fL (ref 78.0–100.0)
Platelets: 270 10*3/uL (ref 150–400)
RBC: 4.56 MIL/uL (ref 4.22–5.81)
RDW: 12.7 % (ref 11.5–15.5)
WBC: 6.4 10*3/uL (ref 4.0–10.5)

## 2012-07-25 LAB — BASIC METABOLIC PANEL
BUN: 15 mg/dL (ref 6–23)
CO2: 22 mEq/L (ref 19–32)
Calcium: 9.1 mg/dL (ref 8.4–10.5)
Chloride: 101 mEq/L (ref 96–112)
Creatinine, Ser: 0.71 mg/dL (ref 0.50–1.35)
GFR calc Af Amer: >90 mL/min (ref >90)
GFR calc non Af Amer: >90 mL/min (ref >90)
Glucose, Bld: 90 mg/dL (ref 70–99)
Potassium: 3.8 mEq/L (ref 3.5–5.1)
Sodium: 135 mEq/L (ref 135–145)

## 2012-07-25 LAB — APTT: aPTT: 38 seconds — ABNORMAL HIGH (ref 24–37)

## 2012-07-25 LAB — PROTIME-INR
INR: 1.62 — ABNORMAL HIGH (ref 0.00–1.49)
Prothrombin Time: 19.5 seconds — ABNORMAL HIGH (ref 11.6–15.2)

## 2012-07-25 LAB — D-DIMER, QUANTITATIVE: D-Dimer, Quant: 0.41 ug/mL-FEU (ref 0.00–0.48)

## 2012-07-25 MED ORDER — ENOXAPARIN SODIUM 150 MG/ML ~~LOC~~ SOLN
1.0000 mg/kg | Freq: Once | SUBCUTANEOUS | Status: AC
  Administered 2012-07-26: 145 mg via SUBCUTANEOUS
  Filled 2012-07-25: qty 1

## 2012-07-25 NOTE — ED Notes (Signed)
PA at bedside.

## 2012-07-25 NOTE — ED Provider Notes (Signed)
History     CSN: [id number]  Arrival date & time 07/25/12  1911   First MD Initiated Contact with Patient 07/25/12 2004      Chief Complaint  Patient presents with  . Leg Pain  . Coagulation Disorder    (Consider location/radiation/quality/duration/timing/severity/associated sxs/prior treatment) HPI Comments: Patient is a 56 year old obese male with a history of bilateral pulmonary embolism and arm thrombosis that presents emergency department with a chief complaint of right calf pain.  Patient states that the onset of symptoms began this morning, but denies any shortness of breath, chest pain, palpitations, leg swelling or warmth, cough, hemoptysis, fever, night sweats or chills.  Patient is currently taking Coumadin and has a home monitor that states his INR was 1.  His primary physician recommended he come to the emergency department for further testing.  Patient denies recent surgery, cancer hx, decreased mobility, or recent travel.  Patient has no other complaints at this time.  Patient is a 56 y.o. male presenting with leg pain. The history is provided by the patient.  Leg Pain     Past Medical History  Diagnosis Date  . OSA (obstructive sleep apnea)     dr Shelle Iron  . Testosterone deficiency     dr Patsi Sears, shots every 2 weeks  . ED (erectile dysfunction)   . GERD (gastroesophageal reflux disease)     [facility]  . Low back pain     dr Channing Mutters, dr Farris Has, dr Ethelene Hal, herniated disc L4-5  . PE (pulmonary embolism)     bilateral sep 2011 and again bilateral May 2012  . 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 Primary hypercoagulable state     no etiology found per Dr. Shirline Frees   . Hx of colonoscopy     Past Surgical History  Procedure Date  . Knee surgery     both  . Shoulder surgery     right and left  . Esi     L4-5 dr Channing Mutters 03/2010  . Rotator cuff repair 08-20-10    left dr Wyline Mood complicated by PE  . Esophagogastroduodenoscopy     x2 - normal except reflux     Family History  Problem Relation Age of Onset  . Hypertension Other   . Cancer Other     lung  . Coronary artery disease Neg Hx     History  Substance Use Topics  . Smoking status: Never Smoker   . Smokeless tobacco: Never Used  . Alcohol Use: No      Review of Systems  All other systems reviewed and are negative.    Allergies  Review of patient's allergies indicates no known allergies.  Home Medications   Current Outpatient Rx  Name Route Sig Dispense Refill  . ALPRAZOLAM 1 MG PO TABS Oral Take 1 mg by mouth 3 (three) times daily as needed. Anxiety.    . CELECOXIB 200 MG PO CAPS Oral Take 200 mg by mouth daily as needed. Pain.    [name] Kitchen FOLIC ACID 1 MG PO TABS Oral Take 1 mg by mouth daily.      . MULTI-VITAMIN/MINERALS PO TABS Oral Take 1 tablet by mouth daily.    [name] Kitchen OMEPRAZOLE 40 MG PO CPDR Oral Take 40 mg by mouth daily.    . OXYCODONE-ACETAMINOPHEN 10-325 MG PO TABS  1 tablet every 6 (six) hours as needed. Pain.    [name] Kitchen TADALAFIL 20 MG PO TABS Oral Take 20 mg by mouth  daily as needed. Before intercourse.    . WARFARIN SODIUM 5 MG PO TABS Oral Take by mouth daily. Take 12.5 mg on Mon, Wed, & Fri and all other days take 10 mg    . OMEPRAZOLE 40 MG PO CPDR Oral Take 40 mg by mouth daily.        BP 137/70  Pulse 68  Temp 98 F (36.7 C) (Oral)  Resp 16  SpO2 94%  Physical Exam  Nursing note and vitals reviewed. Constitutional: He is oriented to person, place, and time. He appears well-developed and well-nourished. No distress.       Vital signs normal  HENT:  Head: Normocephalic and atraumatic.  Eyes: Conjunctivae and EOM are normal.  Neck: Normal range of motion.  Cardiovascular:       Regular rate rhythm, no aberrancy and auscultation, intact distal pulses.  Pulmonary/Chest: Effort normal.       Lungs clear to auscultation bilaterally, no acute respiratory distress.  Musculoskeletal: Normal range of motion.       Mild tenderness to palpation of right calf.   Neurological: He is alert and oriented to person, place, and time.  Skin: Skin is warm and dry. No rash noted. He is not diaphoretic.       No warmth, swelling, or edema of lower extremities.   Psychiatric: He has a normal mood and affect. His behavior is normal.    ED Course  Procedures (including critical care time)  Labs Reviewed  PROTIME-INR - Abnormal; Notable for the following:    Prothrombin Time 19.5 (*)     INR 1.62 (*)     All other components within normal limits  APTT - Abnormal; Notable for the following:    aPTT 38 (*)     All other components within normal limits  CBC  BASIC METABOLIC PANEL  D-DIMER, QUANTITATIVE   No results found.   No diagnosis found.    MDM  Patient with a history of bilateral pulmonary embolus presents to the emergency department with chief complaint of calf pain.  Labs reviewed and patient is slightly subtherapeutic on Coumadin with an INR of 1.62.  Patient given one dose of Lovenox and advised to continue taking his Coumadin.  Patient has a followup scheduled on Monday to recheck his INR.  Return precautions discussed.  Patient verbalizes understanding and appears to be reliable source for followup.  Vital signs normal throughout hospital stay.        Jaci Carrel, [place] 07/25/12 2326

## 2012-07-25 NOTE — ED Notes (Signed)
Lab at bedside

## 2012-07-25 NOTE — ED Notes (Signed)
Pt alert, arrives from home, c/o right lower leg pain, onset was today, hx of PE, currently on blood thinner, concern for DVT, resp even unlabored, skin pwd

## 2012-07-26 NOTE — ED Provider Notes (Signed)
Medical screening examination/treatment/procedure(s) were performed by non-physician practitioner and as supervising physician I was immediately available for consultation/collaboration.  Derwood Kaplan, MD 07/26/12 [phone]

## 2012-07-30 ENCOUNTER — Encounter: Payer: BC Managed Care – PPO | Admitting: Family

## 2012-07-30 ENCOUNTER — Telehealth: Payer: Self-pay | Admitting: Family

## 2012-07-30 NOTE — Telephone Encounter (Signed)
Reschedule INR appointment. Patient missed today.

## 2012-07-30 NOTE — Telephone Encounter (Signed)
Please schedule INR appointment. Missed appointment

## 2012-08-02 NOTE — Telephone Encounter (Signed)
Update please.  Thank you

## 2012-08-03 ENCOUNTER — Ambulatory Visit (INDEPENDENT_AMBULATORY_CARE_PROVIDER_SITE_OTHER): Payer: BC Managed Care – PPO | Admitting: Family

## 2012-08-03 DIAGNOSIS — I2699 Other pulmonary embolism without acute cor pulmonale: Secondary | ICD-10-CM

## 2012-08-03 LAB — POCT INR: INR: 1.9

## 2012-08-03 NOTE — Patient Instructions (Signed)
Take an extra 2.5mg  today (1/2 pill) today. 12.5 mg on mondays,wednesdays and fridays,10 mg on other days,check in 4  Weeks    Latest dosing instructions   Total Sun Mon Tue Wed Thu Fri Sat   77.5 10 mg 12.5 mg 10 mg 12.5 mg 10 mg 12.5 mg 10 mg    (5 mg2) (5 mg2.5) (5 mg2) (5 mg2.5) (5 mg2) (5 mg2.5) (5 mg2)

## 2012-08-06 ENCOUNTER — Other Ambulatory Visit: Payer: Self-pay | Admitting: Family Medicine

## 2012-08-24 ENCOUNTER — Ambulatory Visit: Payer: BC Managed Care – PPO | Admitting: Hematology & Oncology

## 2012-08-24 ENCOUNTER — Other Ambulatory Visit: Payer: BC Managed Care – PPO | Admitting: Lab

## 2012-08-27 ENCOUNTER — Telehealth: Payer: Self-pay | Admitting: Hematology & Oncology

## 2012-08-27 NOTE — Telephone Encounter (Signed)
Pt made 9-30 from 08-24-12

## 2012-09-03 ENCOUNTER — Ambulatory Visit (INDEPENDENT_AMBULATORY_CARE_PROVIDER_SITE_OTHER): Payer: BC Managed Care – PPO | Admitting: Family

## 2012-09-03 ENCOUNTER — Other Ambulatory Visit: Payer: Self-pay | Admitting: *Deleted

## 2012-09-03 DIAGNOSIS — K219 Gastro-esophageal reflux disease without esophagitis: Secondary | ICD-10-CM

## 2012-09-03 DIAGNOSIS — I2699 Other pulmonary embolism without acute cor pulmonale: Secondary | ICD-10-CM

## 2012-09-03 LAB — POCT INR: INR: 2.5

## 2012-09-03 MED ORDER — FOLIC ACID 1 MG PO TABS: 1.0000 mg | ORAL_TABLET | Freq: Every day | ORAL | Status: DC

## 2012-09-11 ENCOUNTER — Ambulatory Visit (INDEPENDENT_AMBULATORY_CARE_PROVIDER_SITE_OTHER): Payer: BC Managed Care – PPO | Admitting: Family Medicine

## 2012-09-11 ENCOUNTER — Encounter: Payer: Self-pay | Admitting: Family Medicine

## 2012-09-11 ENCOUNTER — Encounter (HOSPITAL_COMMUNITY): Payer: Self-pay | Admitting: *Deleted

## 2012-09-11 ENCOUNTER — Ambulatory Visit (INDEPENDENT_AMBULATORY_CARE_PROVIDER_SITE_OTHER)
Admission: RE | Admit: 2012-09-11 | Discharge: 2012-09-11 | Disposition: A | Discharge status: Home / Self Care | Payer: BC Managed Care – PPO | Source: Ambulatory Visit | Attending: Family Medicine | Admitting: Family Medicine

## 2012-09-11 ENCOUNTER — Inpatient Hospital Stay (HOSPITAL_COMMUNITY)
Admission: EM | Admit: 2012-09-11 | Discharge: 2012-09-18 | DRG: 883 | Disposition: A | Discharge status: Home / Self Care | Payer: BC Managed Care – PPO | Attending: General Surgery | Admitting: General Surgery

## 2012-09-11 VITALS — BP 122/74 | HR 104 | Temp 98.8°F | Wt 335.0 lb

## 2012-09-11 DIAGNOSIS — K3580 Unspecified acute appendicitis: Principal | ICD-10-CM | POA: Diagnosis present

## 2012-09-11 DIAGNOSIS — Z86711 Personal history of pulmonary embolism: Secondary | ICD-10-CM

## 2012-09-11 DIAGNOSIS — R1031 Right lower quadrant pain: Secondary | ICD-10-CM

## 2012-09-11 DIAGNOSIS — K219 Gastro-esophageal reflux disease without esophagitis: Secondary | ICD-10-CM | POA: Diagnosis present

## 2012-09-11 DIAGNOSIS — I2699 Other pulmonary embolism without acute cor pulmonale: Secondary | ICD-10-CM

## 2012-09-11 DIAGNOSIS — Z7901 Long term (current) use of anticoagulants: Secondary | ICD-10-CM

## 2012-09-11 DIAGNOSIS — E291 Testicular hypofunction: Secondary | ICD-10-CM | POA: Diagnosis present

## 2012-09-11 DIAGNOSIS — K3533 Acute appendicitis with perforation and localized peritonitis, with abscess: Secondary | ICD-10-CM | POA: Diagnosis present

## 2012-09-11 DIAGNOSIS — D6859 Other primary thrombophilia: Secondary | ICD-10-CM | POA: Diagnosis present

## 2012-09-11 DIAGNOSIS — Z6841 Body Mass Index (BMI) 40.0 and over, adult: Secondary | ICD-10-CM

## 2012-09-11 DIAGNOSIS — N529 Male erectile dysfunction, unspecified: Secondary | ICD-10-CM | POA: Diagnosis present

## 2012-09-11 DIAGNOSIS — K37 Unspecified appendicitis: Secondary | ICD-10-CM

## 2012-09-11 DIAGNOSIS — F411 Generalized anxiety disorder: Secondary | ICD-10-CM | POA: Diagnosis present

## 2012-09-11 DIAGNOSIS — G4733 Obstructive sleep apnea (adult) (pediatric): Secondary | ICD-10-CM | POA: Diagnosis present

## 2012-09-11 DIAGNOSIS — G4730 Sleep apnea, unspecified: Secondary | ICD-10-CM | POA: Diagnosis present

## 2012-09-11 DIAGNOSIS — Z79899 Other long term (current) drug therapy: Secondary | ICD-10-CM

## 2012-09-11 HISTORY — DX: Other pulmonary embolism without acute cor pulmonale: I26.99

## 2012-09-11 LAB — CBC WITH DIFFERENTIAL/PLATELET
Basophils Absolute: 0.1 10*3/uL (ref 0.0–0.1)
Basophils Relative: 1 % (ref 0–1)
Eosinophils Absolute: 0.2 10*3/uL (ref 0.0–0.7)
Eosinophils Relative: 3 % (ref 0–5)
HCT: 43.8 % (ref 39.0–52.0)
Hemoglobin: 14.9 g/dL (ref 13.0–17.0)
Lymphocytes Relative: 19 % (ref 12–46)
Lymphs Abs: 1.5 10*3/uL (ref 0.7–4.0)
MCH: 30.9 pg (ref 26.0–34.0)
MCHC: 34 g/dL (ref 30.0–36.0)
MCV: 90.9 fL (ref 78.0–100.0)
Monocytes Absolute: 0.5 10*3/uL (ref 0.1–1.0)
Monocytes Relative: 7 % (ref 3–12)
Neutro Abs: 5.3 10*3/uL (ref 1.7–7.7)
Neutrophils Relative %: 70 % (ref 43–77)
Platelets: 274 10*3/uL (ref 150–400)
RBC: 4.82 MIL/uL (ref 4.22–5.81)
RDW: 12.9 % (ref 11.5–15.5)
WBC: 7.6 10*3/uL (ref 4.0–10.5)

## 2012-09-11 LAB — BASIC METABOLIC PANEL
BUN: 11 mg/dL (ref 6–23)
CO2: 24 mEq/L (ref 19–32)
Calcium: 9.2 mg/dL (ref 8.4–10.5)
Chloride: 102 mEq/L (ref 96–112)
Creatinine, Ser: 0.76 mg/dL (ref 0.50–1.35)
GFR calc Af Amer: >90 mL/min (ref >90)
GFR calc non Af Amer: >90 mL/min (ref >90)
Glucose, Bld: 87 mg/dL (ref 70–99)
Potassium: 3.9 mEq/L (ref 3.5–5.1)
Sodium: 137 mEq/L (ref 135–145)

## 2012-09-11 LAB — APTT: aPTT: 42 seconds — ABNORMAL HIGH (ref 24–37)

## 2012-09-11 LAB — POCT URINALYSIS DIPSTICK
Bilirubin, UA: NEGATIVE
Glucose, UA: NEGATIVE
Ketones, UA: NEGATIVE
Leukocytes, UA: NEGATIVE
Nitrite, UA: NEGATIVE
Protein, UA: NEGATIVE
Spec Grav, UA: 1.02
Urobilinogen, UA: 0.2
pH, UA: 6

## 2012-09-11 LAB — PROTIME-INR
INR: 2.11 — ABNORMAL HIGH (ref 0.00–1.49)
Prothrombin Time: 22.8 seconds — ABNORMAL HIGH (ref 11.6–15.2)

## 2012-09-11 MED ORDER — LACTATED RINGERS IV BOLUS (SEPSIS): 1000.0000 mL | Freq: Three times a day (TID) | INTRAVENOUS | Status: AC | PRN

## 2012-09-11 MED ORDER — FOLIC ACID 1 MG PO TABS
1.0000 mg | ORAL_TABLET | Freq: Every day | ORAL | Status: DC
  Administered 2012-09-12 – 2012-09-18 (×6): 1 mg via ORAL
  Filled 2012-09-11 (×7): qty 1

## 2012-09-11 MED ORDER — ALUM & MAG HYDROXIDE-SIMETH 200-200-20 MG/5ML PO SUSP: 30.0000 mL | Freq: Four times a day (QID) | ORAL | Status: DC | PRN

## 2012-09-11 MED ORDER — DEXTROSE IN LACTATED RINGERS 5 % IV SOLN
INTRAVENOUS | Status: DC
  Administered 2012-09-11 – 2012-09-12 (×2): via INTRAVENOUS
  Administered 2012-09-12: 100 mL via INTRAVENOUS

## 2012-09-11 MED ORDER — HYDROMORPHONE HCL PF 1 MG/ML IJ SOLN
0.5000 mg | INTRAMUSCULAR | Status: DC | PRN
  Administered 2012-09-14 (×2): 1 mg via INTRAVENOUS
  Administered 2012-09-14: 2 mg via INTRAVENOUS
  Administered 2012-09-14 – 2012-09-18 (×15): 1 mg via INTRAVENOUS
  Filled 2012-09-11 (×2): qty 1
  Filled 2012-09-11: qty 2
  Filled 2012-09-11 (×15): qty 1

## 2012-09-11 MED ORDER — ADULT MULTIVITAMIN W/MINERALS CH
1.0000 | ORAL_TABLET | Freq: Every day | ORAL | Status: DC
  Administered 2012-09-12 – 2012-09-18 (×6): 1 via ORAL
  Filled 2012-09-11 (×7): qty 1

## 2012-09-11 MED ORDER — MAGIC MOUTHWASH
15.0000 mL | Freq: Four times a day (QID) | ORAL | Status: DC | PRN
  Filled 2012-09-11: qty 15

## 2012-09-11 MED ORDER — PANTOPRAZOLE SODIUM 40 MG PO TBEC
40.0000 mg | DELAYED_RELEASE_TABLET | Freq: Every day | ORAL | Status: DC
  Administered 2012-09-12 – 2012-09-18 (×6): 40 mg via ORAL
  Filled 2012-09-11 (×7): qty 1

## 2012-09-11 MED ORDER — ONDANSETRON HCL 4 MG/2ML IJ SOLN
4.0000 mg | Freq: Four times a day (QID) | INTRAMUSCULAR | Status: DC | PRN
  Administered 2012-09-14: 4 mg via INTRAVENOUS
  Filled 2012-09-11: qty 2

## 2012-09-11 MED ORDER — LIP MEDEX EX OINT
1.0000 "application " | TOPICAL_OINTMENT | Freq: Two times a day (BID) | CUTANEOUS | Status: DC
  Administered 2012-09-11 – 2012-09-18 (×8): 1 via TOPICAL
  Filled 2012-09-11 (×2): qty 7

## 2012-09-11 MED ORDER — DIPHENHYDRAMINE HCL 12.5 MG/5ML PO ELIX: 12.5000 mg | ORAL_SOLUTION | Freq: Four times a day (QID) | ORAL | Status: DC | PRN

## 2012-09-11 MED ORDER — BISACODYL 10 MG RE SUPP
10.0000 mg | Freq: Two times a day (BID) | RECTAL | Status: DC | PRN
  Administered 2012-09-15: 10 mg via RECTAL
  Filled 2012-09-11: qty 1

## 2012-09-11 MED ORDER — ACETAMINOPHEN 325 MG PO TABS: 650.0000 mg | ORAL_TABLET | Freq: Four times a day (QID) | ORAL | Status: DC | PRN

## 2012-09-11 MED ORDER — DIPHENHYDRAMINE HCL 50 MG/ML IJ SOLN
12.5000 mg | Freq: Four times a day (QID) | INTRAMUSCULAR | Status: DC | PRN
Start: 2012-09-11 — End: 2012-09-18
  Administered 2012-09-11: 25 mg via INTRAVENOUS
  Administered 2012-09-12: 12.5 mg via INTRAVENOUS
  Administered 2012-09-12 – 2012-09-17 (×6): 25 mg via INTRAVENOUS
  Filled 2012-09-11 (×8): qty 1

## 2012-09-11 MED ORDER — IOHEXOL 300 MG/ML  SOLN
100.0000 mL | Freq: Once | INTRAMUSCULAR | Status: AC | PRN
  Administered 2012-09-11: 100 mL via INTRAVENOUS

## 2012-09-11 MED ORDER — ALPRAZOLAM 1 MG PO TABS: 1.0000 mg | ORAL_TABLET | Freq: Three times a day (TID) | ORAL | Status: DC | PRN

## 2012-09-11 MED ORDER — ACETAMINOPHEN 650 MG RE SUPP: 650.0000 mg | Freq: Four times a day (QID) | RECTAL | Status: DC | PRN

## 2012-09-11 MED ORDER — PROMETHAZINE HCL 25 MG/ML IJ SOLN
12.5000 mg | Freq: Four times a day (QID) | INTRAMUSCULAR | Status: DC | PRN
  Filled 2012-09-11: qty 1

## 2012-09-11 MED ORDER — DEXTROSE 5 % IV SOLN
2.0000 g | Freq: Three times a day (TID) | INTRAVENOUS | Status: DC
  Administered 2012-09-11 – 2012-09-18 (×20): 2 g via INTRAVENOUS
  Filled 2012-09-11 (×22): qty 2

## 2012-09-11 NOTE — Addendum Note (Signed)
Addended by: Aniceto Boss A on: 09/11/2012 12:37 PM   Modules accepted: Orders

## 2012-09-11 NOTE — Progress Notes (Signed)
ANTICOAGULATION CONSULT NOTE - Initial Consult  Pharmacy Consult for Lovenox to start when INR <2 Indication: Hx PE  No Known Allergies  Patient Measurements:     Vital Signs: Temp: 98.6 F (37 C) (09/24 1756) Temp src: Oral (09/24 1756) BP: 111/56 mmHg (09/24 1756) Pulse Rate: 73  (09/24 1756)  Labs:  [facility] 09/11/12 1955  HGB 14.9  HCT 43.8  PLT 274  APTT 42*  LABPROT 22.8*  INR 2.11*  HEPARINUNFRC --  CREATININE 0.76  CKTOTAL --  CKMB --  TROPONINI --    The CrCl is unknown because both a height and weight (above a minimum accepted value) are required for this calculation.   Medical History: Past Medical History  Diagnosis Date  . OSA (obstructive sleep apnea)     dr Shelle Iron  . Testosterone deficiency     dr Patsi Sears, shots every 2 weeks  . ED (erectile dysfunction)   . GERD (gastroesophageal reflux disease)     [facility]  . Low back pain     dr Channing Mutters, dr Farris Has, dr Ethelene Hal, herniated disc L4-5  . PE (pulmonary embolism)     bilateral sep 2011 and again bilateral May 2012  . 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 Primary hypercoagulable state     no etiology found per Dr. Shirline Frees   . Hx of colonoscopy   . Pulmonary embolism 02/22/2010    CT angio Dx    Medications:   (Not in a hospital admission) Scheduled:    . cefOXitin  2 g Intravenous Q8H  . folic acid  1 mg Oral Daily  . lip balm  1 application Topical BID  . multivitamin with minerals  1 tablet Oral Daily  . pantoprazole  40 mg Oral Daily    Assessment:  71 YOM w/hx PE on chronic coumadin PTA. Pt has appendicitis and surgery is holding oral anticoagulation and would like to start Lovenox when INR <2.   INR tonight is 2.11  Goal of Therapy:  Anti-Xa level 0.6-1.2 units/ml 4hrs after LMWH dose given Monitor platelets by anticoagulation protocol: Yes   Plan:  Daily INR Lovenox full dose when INR is <2  Gwen Her PharmD  [phone] 09/11/2012 9:55 PM

## 2012-09-11 NOTE — ED Notes (Signed)
Pt came from CT scan w/ a 22G L AC IV, had fluids and contrast through IV, nothing else

## 2012-09-11 NOTE — Progress Notes (Signed)
  Subjective:    Patient ID: Gregory Cortez, male    DOB: [birth year], 56 y.o.   MRN: [id number]  HPI Here for 24 hours of dull pressure-like pains in the RLQ of the abdomen. He has been nauseated but has not vomited. No fever. Appetite is normal. He had a protein shake this morning as usual. His BMs are regular and he has no urinary symptoms. It hurts to bend over or to take a deep breath. No SOB.    Review of Systems  Constitutional: Negative.   Respiratory: Negative.   Cardiovascular: Negative.   Gastrointestinal: Positive for nausea and abdominal pain. Negative for vomiting, diarrhea, constipation, blood in stool, abdominal distention and rectal pain.  Genitourinary: Negative.        Objective:   Physical Exam  Constitutional: He appears well-developed and well-nourished.  Cardiovascular: Normal rate, regular rhythm, normal heart sounds and intact distal pulses.   Pulmonary/Chest: Effort normal and breath sounds normal.  Abdominal: Soft. Bowel sounds are normal. He exhibits no distension and no mass. There is no rebound and no guarding.       Moderately tender in the RLQ   Genitourinary: Rectum normal and prostate normal.       No tenderness           Assessment & Plan:  This RLQ pain is worrisome for possible appendicitis. We will set him up for a contrasted abdomen and pelvis CT this afternoon.

## 2012-09-11 NOTE — ED Provider Notes (Addendum)
History     CSN: [id number]  Arrival date & time 09/11/12  1736   First MD Initiated Contact with Patient 09/11/12 1915      Chief Complaint  Patient presents with  . Appendicitis     (Consider location/radiation/quality/duration/timing/severity/associated sxs/prior treatment) Patient is a 56 y.o. male presenting with abdominal pain. The history is provided by the patient.  Abdominal Pain The primary symptoms of the illness include abdominal pain. The primary symptoms of the illness do not include fever, nausea, vomiting or dysuria. Episode onset: One week. The onset of the illness was gradual. The problem has been gradually worsening.  The abdominal pain is located in the RUQ. The abdominal pain radiates to the right flank. The severity of the abdominal pain is 5/10. The abdominal pain is relieved by nothing. Exacerbated by: Nothing.  Associated with: No alcohol or NSAID use. The patient has not had a change in bowel habit. Symptoms associated with the illness do not include chills or anorexia. Associated medical issues comments: History of PEs and hypercoagulable state. He is on chronic Coumadin.    Past Medical History  Diagnosis Date  . OSA (obstructive sleep apnea)     dr Shelle Iron  . Testosterone deficiency     dr Patsi Sears, shots every 2 weeks  . ED (erectile dysfunction)   . GERD (gastroesophageal reflux disease)     [facility]  . Low back pain     dr Channing Mutters, dr Farris Has, dr Ethelene Hal, herniated disc L4-5  . PE (pulmonary embolism)     bilateral sep 2011 and again bilateral May 2012  . 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 Primary hypercoagulable state     no etiology found per Dr. Shirline Frees   . Hx of colonoscopy   . Pulmonary embolism 02/22/2010    CT angio Dx    Past Surgical History  Procedure Date  . Knee surgery     both  . Shoulder surgery     right and left  . Esi     L4-5 dr Channing Mutters 03/2010  . Rotator cuff repair 08-20-10    left dr Wyline Mood complicated by PE  .  Esophagogastroduodenoscopy     x2 - normal except reflux    Family History  Problem Relation Age of Onset  . Hypertension Other   . Cancer Other     lung  . Coronary artery disease Neg Hx     History  Substance Use Topics  . Smoking status: Never Smoker   . Smokeless tobacco: Never Used  . Alcohol Use: No      Review of Systems  Constitutional: Negative for fever and chills.  Gastrointestinal: Positive for abdominal pain. Negative for nausea, vomiting and anorexia.  Genitourinary: Negative for dysuria.  All other systems reviewed and are negative.    Allergies  Review of patient's allergies indicates no known allergies.  Home Medications   Current Outpatient Rx  Name Route Sig Dispense Refill  . ALPRAZOLAM 1 MG PO TABS Oral Take 1 mg by mouth 3 (three) times daily as needed. Anxiety.    . CELECOXIB 200 MG PO CAPS Oral Take 200 mg by mouth daily as needed. Pain.    [name] Kitchen FOLIC ACID 1 MG PO TABS Oral Take 1 mg by mouth daily.    . MULTI-VITAMIN/MINERALS PO TABS Oral Take 1 tablet by mouth daily.    [name] Kitchen OMEPRAZOLE 40 MG PO CPDR Oral Take 40 mg by mouth daily.    [name] Kitchen  OXYCODONE-ACETAMINOPHEN 10-325 MG PO TABS  1 tablet every 6 (six) hours as needed. Pain.    [name] Kitchen TADALAFIL 20 MG PO TABS Oral Take 20 mg by mouth daily as needed. Before intercourse.    . WARFARIN SODIUM 5 MG PO TABS Oral Take by mouth daily. Take 12.5 mg on Mon, Wed, & Fri and all other days take 10 mg    . OMEPRAZOLE 40 MG PO CPDR Oral Take 40 mg by mouth daily.        BP 111/56  Pulse 73  Temp 98.6 F (37 C) (Oral)  Resp 16  SpO2 95%  Physical Exam  Nursing note and vitals reviewed. Constitutional: He is oriented to person, place, and time. He appears well-developed and well-nourished. No distress.       Obese abdomen  HENT:  Head: Normocephalic and atraumatic.  Right Ear: External ear normal.  Left Ear: External ear normal.  Mouth/Throat: Oropharynx is clear and moist.  Eyes: Conjunctivae normal and  EOM are normal. Pupils are equal, round, and reactive to light. Right eye exhibits no discharge.  Neck: Normal range of motion. Neck supple.  Cardiovascular: Normal rate, regular rhythm, normal heart sounds and intact distal pulses.   No murmur heard. Pulmonary/Chest: Not tachypneic. No respiratory distress. He has no decreased breath sounds. He has no wheezes. He has no rhonchi. He has no rales.  Abdominal: Soft. Normal appearance. There is tenderness in the right lower quadrant. There is guarding. There is no rebound.    Musculoskeletal: Normal range of motion. He exhibits no edema and no tenderness.  Neurological: He is alert and oriented to person, place, and time.  Skin: Skin is warm and dry. No rash noted.  Psychiatric: He has a normal mood and affect.    ED Course  Procedures (including critical care time)  Labs Reviewed  PROTIME-INR - Abnormal; Notable for the following:    Prothrombin Time 22.8 (*)     INR 2.11 (*)     All other components within normal limits  APTT - Abnormal; Notable for the following:    aPTT 42 (*)     All other components within normal limits  CBC WITH DIFFERENTIAL  BASIC METABOLIC PANEL   Ct Abdomen Pelvis W Contrast  09/11/2012  *RADIOLOGY REPORT*  Clinical Data: Right lower quadrant and flank pain for 1 week. Evaluate for appendicitis.  CT ABDOMEN AND PELVIS WITH CONTRAST  Technique:  Multidetector CT imaging of the abdomen and pelvis was performed following the standard protocol during bolus administration of intravenous contrast.  Contrast: OMNIPAQUE IOHEXOL 300 MG/ML  SOLN  Comparison: None.  Findings: Clear lung bases.  Mild cardiomegaly.  A small to moderate hiatal hernia.  Normal liver, spleen, stomach, pancreas, gallbladder, biliary tract, adrenal glands, kidneys.  No retroperitoneal or retrocrural adenopathy.  Normal colon and terminal ileum.  The proximal appendix is inflamed, with an appendicolith within.  Example image 47.  The  appendiceal tip is relatively uninvolved, and is air filled.  There is no perforation or surrounding abscess. There are prominent nodes in the ileocolic mesentery; likely reactive.  Normal small bowel without abdominal ascites.    No pelvic adenopathy.    Normal urinary bladder and prostate.  No significant free fluid.  No acute osseous abnormality.  IMPRESSION:  1.  Findings of uncomplicated moderate appendicitis. These results will be called to the ordering clinician or representative by the Radiologist Assistant, and communication documented in the PACS Dashboard. 2. Small to moderate  hiatal hernia.   Original Report Authenticated By: Consuello Bossier, M.D.     Date: 09/11/2012  Rate: 72  Rhythm: normal sinus rhythm  QRS Axis: normal  Intervals: normal  ST/T Wave abnormalities: normal  Conduction Disutrbances:none  Narrative Interpretation:   Old EKG Reviewed: unchanged    1. Appendicitis       MDM   Patient with a history of abdominal pain for the last one week but it's gradually gotten worse so he went to see his doctor and a CT was done that showed a moderate appendicitis with an complicating features. Patient denies any nausea or vomiting. No fevers. On exam he does have right mid abdominal tenderness and pain in his back. He denies any dysuria. Patient takes Coumadin chronically for PE and his Coumadin was checked today and it was 2.1. Discussed with surgery and will have patient evaluated.        Gwyneth Sprout, MD 09/11/12 2100  Gwyneth Sprout, MD 09/11/12 2115

## 2012-09-11 NOTE — H&P (Signed)
Gregory Cortez  1956-11-22 [id number]  CARE TEAM:  PCP: Nelwyn Salisbury, MD  Outpatient Care Team: Patient Care Team: Nelwyn Salisbury, MD as PCP - General Kathleene Hazel, MD (Cardiology) Barbaraann Share, MD as Consulting Physician (Pulmonary Disease)  Inpatient Treatment Team: Treatment Team: Attending Provider: Gwyneth Sprout, MD; Technician: Rema Fendt, [place]; Registered Nurse: Liborio Nixon, RN; Consulting Physician: Bishop Limbo, MD   This patient is a 56 y.o.male who presents today for surgical evaluation at the request of Dr. Anitra Lauth.   Reason for evaluation: Appendicitis.  Morbidly obese male on chronic anticoagulation for pulmonary embolism.  Felto have abdominal pain for the past week.  More right-sided.  Has become more intense.  Somewhat decreased appetite.  Mild nausea.  No emesis.  No prior history of any abdominal surgery.  No personal nor family history of GI/colon cancer, inflammatory bowel disease, irritable bowel syndrome, allergy such as Celiac Sprue, dietary/dairy problems, colitis, ulcers nor gastritis.  No recent sick contacts/gastroenteritis.  No travel outside the country.  No changes in diet.  Based on concerns of persistent abdominal pain, he went to his primary care physician.  Dr. Clent Ridges was concerned about appendicitis.  CT scan was ordered & done this afternoon.  He was recommend he go to the emergency room.  He was in the waiting area for several hours.  I was called and saw the patient within 20 minutes.  Wife in room    Patient Active Problem List  Diagnosis  . HYPOGONADISM  . Anxiety state, unspecified  . SLEEP APNEA, OBSTRUCTIVE  . ACUTE BRONCHITIS  . GERD  . ERECTILE DYSFUNCTION, ORGANIC  . LOW BACK PAIN  . EDEMA LEG  . Shortness of breath  . HICCUPS  . Chronic anticoagulation for h/o Pulmonary embolism  . Primary hypercoagulable state  . Obesity, Class III, BMI 40-49.9 (morbid obesity)    Past Medical History  Diagnosis Date  . OSA  (obstructive sleep apnea)     dr Shelle Iron  . Testosterone deficiency     dr Patsi Sears, shots every 2 weeks  . ED (erectile dysfunction)   . GERD (gastroesophageal reflux disease)     [facility]  . Low back pain     dr Channing Mutters, dr Farris Has, dr Ethelene Hal, herniated disc L4-5  . PE (pulmonary embolism)     bilateral sep 2011 and again bilateral May 2012  . 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 Primary hypercoagulable state     no etiology found per Dr. Shirline Frees   . Hx of colonoscopy   . Pulmonary embolism 02/22/2010    CT angio Dx    Past Surgical History  Procedure Date  . Knee surgery     both  . Shoulder surgery     right and left  . Esi     L4-5 dr Channing Mutters 03/2010  . Rotator cuff repair 08-20-10    left dr Wyline Mood complicated by PE  . Esophagogastroduodenoscopy     x2 - normal except reflux    History   Social History  . Marital Status: Married    Spouse Name: N/A    Number of Children: 3  . Years of Education: N/A   Occupational History  . Desk job-petroleum dispatcher    Social History Main Topics  . Smoking status: Never Smoker   . Smokeless tobacco: Never Used  . Alcohol Use: No  . Drug Use: No  . Sexually Active: Not on  file   Other Topics Concern  . Not on file   Social History Narrative  . No narrative on file    Family History  Problem Relation Age of Onset  . Hypertension Other   . Cancer Other     lung  . Coronary artery disease Neg Hx     Current Facility-Administered Medications  Medication Dose Route Frequency Provider Last Rate Last Dose  . acetaminophen (TYLENOL) tablet 650 mg  650 mg Oral Q6H PRN Ardeth Sportsman, MD       Or  . acetaminophen (TYLENOL) suppository 650 mg  650 mg Rectal Q6H PRN Ardeth Sportsman, MD      . ALPRAZolam Prudy Feeler) tablet 1 mg  1 mg Oral TID PRN Ardeth Sportsman, MD      . alum & mag hydroxide-simeth (MAALOX/MYLANTA) 200-200-20 MG/5ML suspension 30 mL  30 mL Oral Q6H PRN Ardeth Sportsman, MD      . bisacodyl (DULCOLAX)  suppository 10 mg  10 mg Rectal Q12H PRN Ardeth Sportsman, MD      . cefOXitin (MEFOXIN) 2 g in dextrose 5 % 50 mL IVPB  2 g Intravenous Q8H Ardeth Sportsman, MD      . dextrose 5 % in lactated ringers infusion   Intravenous Continuous Ardeth Sportsman, MD      . diphenhydrAMINE (BENADRYL) injection 12.5-25 mg  12.5-25 mg Intravenous Q6H PRN Ardeth Sportsman, MD       Or  . diphenhydrAMINE (BENADRYL) 12.5 MG/5ML elixir 12.5-25 mg  12.5-25 mg Oral Q6H PRN Ardeth Sportsman, MD      . folic acid (FOLVITE) tablet 1 mg  1 mg Oral Daily Ardeth Sportsman, MD      . HYDROmorphone (DILAUDID) injection 0.5-2 mg  0.5-2 mg Intravenous Q2H PRN Ardeth Sportsman, MD      . lactated ringers bolus 1,000 mL  1,000 mL Intravenous Q8H PRN Ardeth Sportsman, MD      . lip balm (CARMEX) ointment 1 application  1 application Topical BID Ardeth Sportsman, MD      . magic mouthwash  15 mL Oral QID PRN Ardeth Sportsman, MD      . multivitamin with minerals tablet 1 tablet  1 tablet Oral Daily Ardeth Sportsman, MD      . ondansetron [facility]) injection 4 mg  4 mg Intravenous Q6H PRN Ardeth Sportsman, MD      . pantoprazole (PROTONIX) EC tablet 40 mg  40 mg Oral Q1200 Ardeth Sportsman, MD      . promethazine (PHENERGAN) injection 12.5-25 mg  12.5-25 mg Intravenous Q6H PRN Ardeth Sportsman, MD       Current Outpatient Prescriptions  Medication Sig Dispense Refill  . ALPRAZolam (XANAX) 1 MG tablet Take 1 mg by mouth 3 (three) times daily as needed. Anxiety.      . celecoxib (CELEBREX) 200 MG capsule Take 200 mg by mouth daily as needed. Pain.      . folic acid (FOLVITE) 1 MG tablet Take 1 mg by mouth daily.      . Multiple Vitamins-Minerals (MULTIVITAMIN WITH MINERALS) tablet Take 1 tablet by mouth daily.      [name] Kitchen omeprazole (PRILOSEC) 40 MG capsule Take 40 mg by mouth daily.      [name] Kitchen oxyCODONE-acetaminophen (PERCOCET) 10-325 MG per tablet 1 tablet every 6 (six) hours as needed. Pain.      . tadalafil (CIALIS) 20 MG tablet Take  20 mg by  mouth daily as needed. Before intercourse.      . warfarin (COUMADIN) 5 MG tablet Take by mouth daily. Take 12.5 mg on Mon, Wed, & Fri and all other days take 10 mg      . omeprazole (PRILOSEC) 40 MG capsule Take 40 mg by mouth daily.        [name] Kitchen DISCONTD: omeprazole (PRILOSEC) 40 MG capsule TAKE ONE CAPSULE TWICE A DAY  60 capsule  11   Facility-Administered Medications Ordered in Other Encounters  Medication Dose Route Frequency Provider Last Rate Last Dose  . iohexol (OMNIPAQUE) 300 MG/ML solution 100 mL  100 mL Intravenous Once PRN Medication Radiologist, MD   100 mL at 09/11/12 1605     No Known Allergies  ROS: Constitutional:  No fevers, chills, sweats.  Weight stable Eyes:  No vision changes, No discharge HENT:  No sore throats, nasal drainage Lymph: No neck swelling, No bruising easily Pulmonary:  No cough, productive sputum CV: No orthopnea, PND  Patient walks 30 minutes for about 1-2 miles on the treadmill without difficulty.  No exertional chest/neck/shoulder/arm pain.  GI: No personal nor family history of GI/colon cancer, inflammatory bowel disease, irritable bowel syndrome, allergy such as Celiac Sprue, dietary/dairy problems, colitis, ulcers nor gastritis.  No recent sick contacts/gastroenteritis.  No travel outside the country.  No changes in diet. Renal: No UTIs, No hematuria Genital:  No drainage, bleeding, masses Musculoskeletal: No severe joint pain.  Good ROM major joints Skin:  No sores or lesions.  No rashes Heme/Lymph:  No easy bleeding.  No swollen lymph nodes  BP 111/56  Pulse 73  Temp 98.6 F (37 C) (Oral)  Resp 16  SpO2 95%  Physical Exam: General: Pt awake/alert/oriented x4 in no major acute distress Eyes: PERRL, normal EOM. Sclera nonicteric Neuro: CN II-XII intact w/o focal sensory/motor deficits. Lymph: No head/neck/groin lymphadenopathy Psych:  No delerium/psychosis/paranoia HENT: Normocephalic, Mucus membranes moist.  No thrush Neck: Supple, No  tracheal deviation Chest: No pain.  Good respiratory excursion. CTA bilaterally CV:  Pulses intact.  Regular rhythm Abdomen: Soft, Obese with mod panniculus.  Nondistended.  Mild/mod TTP R flank to back.  Mild irritation with cough.  No incarcerated hernias. Ext:  SCDs BLE.  No significant edema.  No cyanosis Skin: No petechiae / purpurae  Results:   Labs: Results for orders placed during the hospital encounter of 09/11/12 (from the past 48 hour(s))  CBC WITH DIFFERENTIAL     Status: Normal   Collection Time   09/11/12  7:55 PM      Component Value Range Comment   WBC 7.6  4.0 - 10.5 K/uL    RBC 4.82  4.22 - 5.81 MIL/uL    Hemoglobin 14.9  13.0 - 17.0 g/dL    HCT 16.1  09.6 - 04.5 %    MCV 90.9  78.0 - 100.0 fL    MCH 30.9  26.0 - 34.0 pg    MCHC 34.0  30.0 - 36.0 g/dL    RDW 40.9  81.1 - 91.4 %    Platelets 274  150 - 400 K/uL    Neutrophils Relative 70  43 - 77 %    Neutro Abs 5.3  1.7 - 7.7 K/uL    Lymphocytes Relative 19  12 - 46 %    Lymphs Abs 1.5  0.7 - 4.0 K/uL    Monocytes Relative 7  3 - 12 %    Monocytes Absolute 0.5  0.1 -  1.0 K/uL    Eosinophils Relative 3  0 - 5 %    Eosinophils Absolute 0.2  0.0 - 0.7 K/uL    Basophils Relative 1  0 - 1 %    Basophils Absolute 0.1  0.0 - 0.1 K/uL   BASIC METABOLIC PANEL     Status: Normal   Collection Time   09/11/12  7:55 PM      Component Value Range Comment   Sodium 137  135 - 145 mEq/L    Potassium 3.9  3.5 - 5.1 mEq/L    Chloride 102  96 - 112 mEq/L    CO2 24  19 - 32 mEq/L    Glucose, Bld 87  70 - 99 mg/dL    BUN 11  6 - 23 mg/dL    Creatinine, Ser 4.54  0.50 - 1.35 mg/dL    Calcium 9.2  8.4 - 09.8 mg/dL    GFR calc non Af Amer >90  >90 mL/min    GFR calc Af Amer >90  >90 mL/min   PROTIME-INR     Status: Abnormal   Collection Time   09/11/12  7:55 PM      Component Value Range Comment   Prothrombin Time 22.8 (*) 11.6 - 15.2 seconds    INR 2.11 (*) 0.00 - 1.49   APTT     Status: Abnormal   Collection Time    09/11/12  7:55 PM      Component Value Range Comment   aPTT 42 (*) 24 - 37 seconds     Imaging / Studies: Ct Abdomen Pelvis W Contrast  09/11/2012  *RADIOLOGY REPORT*  Clinical Data: Right lower quadrant and flank pain for 1 week. Evaluate for appendicitis.  CT ABDOMEN AND PELVIS WITH CONTRAST  Technique:  Multidetector CT imaging of the abdomen and pelvis was performed following the standard protocol during bolus administration of intravenous contrast.  Contrast: OMNIPAQUE IOHEXOL 300 MG/ML  SOLN  Comparison: None.  Findings: Clear lung bases.  Mild cardiomegaly.  A small to moderate hiatal hernia.  Normal liver, spleen, stomach, pancreas, gallbladder, biliary tract, adrenal glands, kidneys.  No retroperitoneal or retrocrural adenopathy.  Normal colon and terminal ileum.  The proximal appendix is inflamed, with an appendicolith within.  Example image 47.  The appendiceal tip is relatively uninvolved, and is air filled.  There is no perforation or surrounding abscess. There are prominent nodes in the ileocolic mesentery; likely reactive.  Normal small bowel without abdominal ascites.    No pelvic adenopathy.    Normal urinary bladder and prostate.  No significant free fluid.  No acute osseous abnormality.  IMPRESSION:  1.  Findings of uncomplicated moderate appendicitis. These results will be called to the ordering clinician or representative by the Radiologist Assistant, and communication documented in the PACS Dashboard. 2. Small to moderate hiatal hernia.   Original Report Authenticated By: Consuello Bossier, M.D.     Medications / Allergies: per chart  Antibiotics: Anti-infectives     Start     Dose/Rate Route Frequency Ordered Stop   09/11/12 2200   cefOXitin (MEFOXIN) 2 g in dextrose 5 % 50 mL IVPB        2 g 100 mL/hr over 30 Minutes Intravenous 3 times per day 09/11/12 2129            Assessment  Gregory Cortez  56 y.o. male       Problem List:  Principal Problem:   *Appendicitis with peritonitis  on CT Active Problems:  Anxiety state, unspecified  SLEEP APNEA, OBSTRUCTIVE  GERD  ERECTILE DYSFUNCTION, ORGANIC  Chronic anticoagulation for h/o Pulmonary embolism  Obesity, Class III, BMI 40-49.9 (morbid obesity)  Appendicitis with inflammation around appendix in a fully anticoagulated patient  Plan:  Admit IV ABx Hold anticoagulation orally.  Start enoxaparin 1mg /kg BID once INR <2. Once INR less than 1.5, proceed with laparoscopic and possible open appendectomy:  The anatomy & physiology of the digestive tract was discussed.  The pathophysiology of appendicitis was discussed.  Natural history risks without surgery was discussed.   I feel the risks of no intervention will lead to serious problems that outweigh the operative risks; therefore, I recommended diagnostic laparoscopy with removal of appendix to remove the pathology.  Laparoscopic & open techniques were discussed.   I noted a good likelihood this will help address the problem.    Risks such as bleeding, infection, abscess, leak, reoperation, possible ostomy, hernia, heart attack, death, and other risks were discussed.  Goals of post-operative recovery were discussed as well.  We will work to minimize complications.  Questions were answered.  The patient expresses understanding & wishes to proceed with surgery.  -CPAP for OSA -anxiolysis -VTE prophylaxis- SCDs, etc -mobilize as tolerated to help recovery  Ardeth Sportsman, M.D., F.A.C.S. Gastrointestinal and Minimally Invasive Surgery [facility] [address], [address] [place], [place] [address] [phone] Main / Paging [phone] Voice Mail   09/11/2012

## 2012-09-11 NOTE — ED Notes (Signed)
Pt started having R sided lower abdominal pain x 1 week ago, went to doctor today, went to cardiologist also, had blood work and lab tests done, was told might have mild appendicitis and to come to the ED. Pt states has had nausea but denies vomiting for diarrhea.

## 2012-09-12 ENCOUNTER — Encounter (HOSPITAL_COMMUNITY): Payer: Self-pay | Admitting: *Deleted

## 2012-09-12 ENCOUNTER — Telehealth: Payer: Self-pay | Admitting: Family Medicine

## 2012-09-12 LAB — PROTIME-INR
INR: 2.14 — ABNORMAL HIGH (ref 0.00–1.49)
INR: 1.48 (ref 0.00–1.49)
Prothrombin Time: 23 seconds — ABNORMAL HIGH (ref 11.6–15.2)
Prothrombin Time: 17.5 seconds — ABNORMAL HIGH (ref 11.6–15.2)

## 2012-09-12 LAB — CBC
HCT: 40.7 % (ref 39.0–52.0)
Hemoglobin: 13.7 g/dL (ref 13.0–17.0)
MCH: 30.9 pg (ref 26.0–34.0)
MCHC: 33.7 g/dL (ref 30.0–36.0)
MCV: 91.7 fL (ref 78.0–100.0)
Platelets: 230 10*3/uL (ref 150–400)
RBC: 4.44 MIL/uL (ref 4.22–5.81)
RDW: 13.1 % (ref 11.5–15.5)
WBC: 6.3 10*3/uL (ref 4.0–10.5)

## 2012-09-12 LAB — SURGICAL PCR SCREEN
MRSA, PCR: NEGATIVE
Staphylococcus aureus: POSITIVE — AB

## 2012-09-12 LAB — ABO/RH: ABO/RH(D): B POS

## 2012-09-12 MED ORDER — HEPARIN BOLUS VIA INFUSION
3000.0000 [IU] | Freq: Once | INTRAVENOUS | Status: AC
  Administered 2012-09-12: 3000 [IU] via INTRAVENOUS
  Filled 2012-09-12: qty 3000

## 2012-09-12 MED ORDER — HEPARIN (PORCINE) IN NACL 100-0.45 UNIT/ML-% IJ SOLN
2000.0000 [IU]/h | INTRAMUSCULAR | Status: DC
  Administered 2012-09-12: 2000 [IU]/h via INTRAVENOUS
  Filled 2012-09-12 (×2): qty 250

## 2012-09-12 MED ORDER — INFLUENZA VIRUS VACC SPLIT PF IM SUSP
0.5000 mL | INTRAMUSCULAR | Status: AC
  Filled 2012-09-12: qty 0.5

## 2012-09-12 MED ORDER — VITAMIN K1 10 MG/ML IJ SOLN
2.0000 mg | Freq: Once | INTRAVENOUS | Status: AC
  Administered 2012-09-12: 2 mg via INTRAVENOUS
  Filled 2012-09-12: qty 0.2

## 2012-09-12 NOTE — Progress Notes (Signed)
Arrived to pt's bedside.  Pt was found already on his home cpap unit with a full face mask.  Pt is tolerating well at this time.  Cpap unit appears to be working correctly, no frays on cord or defects noted at this time.  Offered to add sterile water to patient's machine, but he refused.  Service response called and request made to have Biomed check out patient's home unit.

## 2012-09-12 NOTE — Progress Notes (Signed)
Second unit infusing without signs of reaction.  Vital signs are documented.  Patient declines the ice pack ordered at this time.  Family in room with patient at this time.

## 2012-09-12 NOTE — Progress Notes (Signed)
Transfusion completed, no signs of reaction, tolerated the infusion without issue, vital signs documented.

## 2012-09-12 NOTE — Progress Notes (Signed)
Agree with above.   Reversing anticoagulation and planning lap appy tomorrow.

## 2012-09-12 NOTE — Telephone Encounter (Signed)
[facility] Triage Call Report Triage Record Num: [id number] Operator: Patriciaann Clan Patient Name: Brianna Esson Call Date & Time: 09/11/2012 7:34:39PM Patient Phone: [phone] PCP: Tera Mater. Clent Ridges Patient Gender: Male PCP Fax : [phone] Patient DOB: Feb 25, 1956 Practice Name: [facility] Reason for Call: Caller: Pearl/Mother; PCP: [name] [facility]); CB#: [phone]; Call regarding Demands to speak to Dr. Clent Ridges. Mom/Pearl calling. States patient had CAT scan of abdomen 09/11/12 and received a call from Dr. Clent Ridges to go the Emergency Department for appendicitis. Mom states patient has been in the waiting room at [facility] since 1500-1600 09/11/12 and has not yet been seen in the emergency room. States patient was evaluated by triage nurse in the emergency department. During call, patient was called into the emergency department to be evaluated. Protocol(s) Used: Office Note Recommended Outcome per Protocol: Information Noted and Sent to Office Reason for Outcome: Caller information to office Care Advice: ~ 09/

## 2012-09-12 NOTE — Progress Notes (Addendum)
ANTICOAGULATION CONSULT NOTE - Follow Up Consult  Pharmacy Consult for:  IV Heparin Indication:  On chronic Coumadin for history of PE and hypercoagulable state; bridging for surgery on 9/26  No Known Allergies  Patient Measurements: Height: 5' 10.5" (179.1 cm) Weight: 335 lb 1.6 oz (152 kg) IBW/kg (Calculated) : 74.15    Vital Signs: Temp: 97.5 F (36.4 C) (09/25 2110) Temp src: Axillary (09/25 2110) BP: 120/51 mmHg (09/25 2110) Pulse Rate: 66  (09/25 2110)  Labs:  Basename 09/12/12 1954 09/12/12 0455 09/11/12 1955  HGB -- 13.7 14.9  HCT -- 40.7 43.8  PLT -- 230 274  APTT -- -- 42*  LABPROT 17.5* 23.0* 22.8*  INR 1.48 2.14* 2.11*  HEPARINUNFRC -- -- --  CREATININE -- -- 0.76  CKTOTAL -- -- --  CKMB -- -- --  TROPONINI -- -- --    Estimated Creatinine Clearance: 159 ml/min (by C-G formula based on Cr of 0.76).  Assessment:  Asked to begin an IV Heparin infusion when the INR falls below 2.  The INR result at 19:54 is reported as 1.48.  As requested, notified surgeon on call, Dr. Dwain Sarna.  Received an order to stop the Heparin infusion 4 hours prior to surgery.  Surgery is scheduled for 13:00 on 9/26.  Goals of Therapy:   Heparin level 0.3-0.7 units/ml  Monitor platelets by anticoagulation protocol: Yes  Prevention of VTE   Plan:   Heparin bolus 3000 units, then infusion at 2000 units/hr.  Heparin level in a.m. 9/26  Discontinue the infusion at 09:00.  Polo Riley R.Ph. 09/12/2012 9:58 PM   Addendum:  Assessement:   HL = 0.27 units/ml after 3000 unit bolus and drip @ 2000 units/hr x ~ 6 hrs.  Below desired goal of 0.3-0.7 units/ml.  No IV interuptions or bleeding noted per RN  Plan:  Increase Heparin drip to 2400 units/hr.  Drip to be discontinued @ 0900 today for 1300 OR.  F/U restart.  Lorenza Evangelist 09/13/2012 6:07 AM'

## 2012-09-12 NOTE — Progress Notes (Signed)
Subjective: Pain in right side about a week, he thought it was muscle soreness, from exercise.  He can push on it and feel it but not having any significant discomfort. Hungry wants to eat.  Objective: Vital signs in last 24 hours: Temp:  [98.6 F (37 C)-98.8 F (37.1 C)] 98.6 F (37 C) (09/25 9604) Pulse Rate:  [67-104] 67  (09/25 0623) Resp:  [16-18] 18  (09/25 0623) BP: (105-124)/(55-81) 105/55 mmHg (09/25 0623) SpO2:  [93 %-97 %] 93 % (09/25 0623) Weight:  [151.955 kg (335 lb)-152 kg (335 lb 1.6 oz)] 152 kg (335 lb 1.6 oz) (09/24 2230)    Afebrile, VSS, NPO, INR 2.14 this 5 AM, CT 9/24 shows uncomplicated moderate appendicitis, no perforation, or abscess, + nodes  Intake/Output from previous day: 09/24 0701 - 09/25 0700 In: 400 [I.V.:400] Out: -  Intake/Output this shift:    General appearance: alert, cooperative and no distress GI: soft, mildly tender on palp.  Lab Results:   Basename 09/12/12 0455 09/11/12 1955  WBC 6.3 7.6  HGB 13.7 14.9  HCT 40.7 43.8  PLT 230 274    BMET  Basename 09/11/12 1955  NA 137  K 3.9  CL 102  CO2 24  GLUCOSE 87  BUN 11  CREATININE 0.76  CALCIUM 9.2   PT/INR  Basename 09/12/12 0455 09/11/12 1955  LABPROT 23.0* 22.8*  INR 2.14* 2.11*    No results found for this basename: AST:5,ALT:5,ALKPHOS:5,BILITOT:5,PROT:5,ALBUMIN:5 in the last 168 hours   Lipase  No results found for this basename: lipase     Studies/Results: Ct Abdomen Pelvis W Contrast  09/11/2012  *RADIOLOGY REPORT*  Clinical Data: Right lower quadrant and flank pain for 1 week. Evaluate for appendicitis.  CT ABDOMEN AND PELVIS WITH CONTRAST  Technique:  Multidetector CT imaging of the abdomen and pelvis was performed following the standard protocol during bolus administration of intravenous contrast.  Contrast: OMNIPAQUE IOHEXOL 300 MG/ML  SOLN  Comparison: None.  Findings: Clear lung bases.  Mild cardiomegaly.  A small to moderate hiatal hernia.   Normal liver, spleen, stomach, pancreas, gallbladder, biliary tract, adrenal glands, kidneys.  No retroperitoneal or retrocrural adenopathy.  Normal colon and terminal ileum.  The proximal appendix is inflamed, with an appendicolith within.  Example image 47.  The appendiceal tip is relatively uninvolved, and is air filled.  There is no perforation or surrounding abscess. There are prominent nodes in the ileocolic mesentery; likely reactive.  Normal small bowel without abdominal ascites.    No pelvic adenopathy.    Normal urinary bladder and prostate.  No significant free fluid.  No acute osseous abnormality.  IMPRESSION:  1.  Findings of uncomplicated moderate appendicitis. These results will be called to the ordering clinician or representative by the Radiologist Assistant, and communication documented in the PACS Dashboard. 2. Small to moderate hiatal hernia.   Original Report Authenticated By: Consuello Bossier, M.D.     Medications:    . cefOXitin  2 g Intravenous Q8H  . folic acid  1 mg Oral Daily  . influenza  inactive virus vaccine  0.5 mL Intramuscular Tomorrow-1000  . lip balm  1 application Topical BID  . multivitamin with minerals  1 tablet Oral Daily  . pantoprazole  40 mg Oral Daily  . phytonadione (VITAMIN K) IV  2 mg Intravenous Once    Assessment/Plan Appendicitis with peritonitis on CT Hx of PE, fully anticoagulated/hx of hypercoagulable state Obesity BMI 47.5 Sleep apnea GERD Hx of anxiety  Plan:  Reverse coumadin, get pharmacy to do one dose of lovenox today, then go to heparin tonight so we can stop it for surgery tomorrow.  Ice chips for now.      LOS: 1 day    Prestin Munch 09/12/2012

## 2012-09-13 ENCOUNTER — Encounter (HOSPITAL_COMMUNITY): Payer: Self-pay | Admitting: Anesthesiology

## 2012-09-13 ENCOUNTER — Encounter (HOSPITAL_COMMUNITY): Payer: Self-pay

## 2012-09-13 ENCOUNTER — Encounter (HOSPITAL_COMMUNITY): Admission: EM | Disposition: A | Discharge status: Home / Self Care | Payer: Self-pay | Source: Home / Self Care

## 2012-09-13 ENCOUNTER — Inpatient Hospital Stay (HOSPITAL_COMMUNITY): Payer: BC Managed Care – PPO | Admitting: Anesthesiology

## 2012-09-13 DIAGNOSIS — K3580 Unspecified acute appendicitis: Secondary | ICD-10-CM

## 2012-09-13 HISTORY — PX: LAPAROSCOPIC APPENDECTOMY: SHX408

## 2012-09-13 LAB — PREPARE FRESH FROZEN PLASMA
Unit division: 0
Unit division: 0

## 2012-09-13 LAB — CBC
HCT: 42.4 % (ref 39.0–52.0)
Hemoglobin: 14.2 g/dL (ref 13.0–17.0)
MCH: 30.7 pg (ref 26.0–34.0)
MCHC: 33.5 g/dL (ref 30.0–36.0)
MCV: 91.6 fL (ref 78.0–100.0)
Platelets: 272 10*3/uL (ref 150–400)
RBC: 4.63 MIL/uL (ref 4.22–5.81)
RDW: 12.9 % (ref 11.5–15.5)
WBC: 6.3 10*3/uL (ref 4.0–10.5)

## 2012-09-13 LAB — BASIC METABOLIC PANEL
BUN: 8 mg/dL (ref 6–23)
CO2: 26 mEq/L (ref 19–32)
Calcium: 9.4 mg/dL (ref 8.4–10.5)
Chloride: 102 mEq/L (ref 96–112)
Creatinine, Ser: 0.85 mg/dL (ref 0.50–1.35)
GFR calc Af Amer: >90 mL/min (ref >90)
GFR calc non Af Amer: >90 mL/min (ref >90)
Glucose, Bld: 99 mg/dL (ref 70–99)
Potassium: 3.9 mEq/L (ref 3.5–5.1)
Sodium: 137 mEq/L (ref 135–145)

## 2012-09-13 LAB — HEPARIN LEVEL (UNFRACTIONATED): Heparin Unfractionated: 0.27 IU/mL — ABNORMAL LOW (ref 0.30–0.70)

## 2012-09-13 LAB — PROTIME-INR
INR: 1.39 (ref 0.00–1.49)
Prothrombin Time: 16.7 seconds — ABNORMAL HIGH (ref 11.6–15.2)

## 2012-09-13 SURGERY — APPENDECTOMY, LAPAROSCOPIC
Anesthesia: General | Wound class: Clean Contaminated

## 2012-09-13 MED ORDER — MIDAZOLAM HCL 5 MG/5ML IJ SOLN
INTRAMUSCULAR | Status: DC | PRN
  Administered 2012-09-13: 2 mg via INTRAVENOUS

## 2012-09-13 MED ORDER — PROPOFOL 10 MG/ML IV BOLUS
INTRAVENOUS | Status: DC | PRN
  Administered 2012-09-13: 250 mg via INTRAVENOUS

## 2012-09-13 MED ORDER — HEPARIN (PORCINE) IN NACL 100-0.45 UNIT/ML-% IJ SOLN
2300.0000 [IU]/h | INTRAMUSCULAR | Status: AC
  Administered 2012-09-13: 2300 [IU]/h via INTRAVENOUS
  Filled 2012-09-13 (×2): qty 250

## 2012-09-13 MED ORDER — LIDOCAINE HCL (PF) 1 % IJ SOLN
INTRAMUSCULAR | Status: DC | PRN
  Administered 2012-09-13: 20 mL

## 2012-09-13 MED ORDER — BUPIVACAINE-EPINEPHRINE 0.25% -1:200000 IJ SOLN
INTRAMUSCULAR | Status: DC | PRN
  Administered 2012-09-13: 20 mL

## 2012-09-13 MED ORDER — PROMETHAZINE HCL 25 MG/ML IJ SOLN: 6.2500 mg | INTRAMUSCULAR | Status: DC | PRN

## 2012-09-13 MED ORDER — EPHEDRINE SULFATE 50 MG/ML IJ SOLN
INTRAMUSCULAR | Status: DC | PRN
  Administered 2012-09-13: 5 mg via INTRAVENOUS

## 2012-09-13 MED ORDER — HEPARIN (PORCINE) IN NACL 100-0.45 UNIT/ML-% IJ SOLN
2400.0000 [IU]/h | INTRAMUSCULAR | Status: AC
  Filled 2012-09-13: qty 250

## 2012-09-13 MED ORDER — GLYCOPYRROLATE 0.2 MG/ML IJ SOLN
INTRAMUSCULAR | Status: DC | PRN
  Administered 2012-09-13: .8 mg via INTRAVENOUS

## 2012-09-13 MED ORDER — ONDANSETRON HCL 4 MG/2ML IJ SOLN
INTRAMUSCULAR | Status: DC | PRN
  Administered 2012-09-13: 4 mg via INTRAVENOUS

## 2012-09-13 MED ORDER — 0.9 % SODIUM CHLORIDE (POUR BTL) OPTIME
TOPICAL | Status: DC | PRN
  Administered 2012-09-13: 1000 mL

## 2012-09-13 MED ORDER — FENTANYL CITRATE 0.05 MG/ML IJ SOLN
INTRAMUSCULAR | Status: DC | PRN
  Administered 2012-09-13 (×5): 50 ug via INTRAVENOUS

## 2012-09-13 MED ORDER — SUCCINYLCHOLINE CHLORIDE 20 MG/ML IJ SOLN
INTRAMUSCULAR | Status: DC | PRN
  Administered 2012-09-13: 140 mg via INTRAVENOUS

## 2012-09-13 MED ORDER — LACTATED RINGERS IV SOLN
INTRAVENOUS | Status: DC
  Administered 2012-09-13: 1000 mL via INTRAVENOUS
  Administered 2012-09-13 (×2): via INTRAVENOUS

## 2012-09-13 MED ORDER — LIDOCAINE HCL (CARDIAC) 20 MG/ML IV SOLN
INTRAVENOUS | Status: DC | PRN
  Administered 2012-09-13: 100 mg via INTRAVENOUS

## 2012-09-13 MED ORDER — HYDROMORPHONE HCL PF 1 MG/ML IJ SOLN
INTRAMUSCULAR | Status: DC | PRN
  Administered 2012-09-13 (×2): 1 mg via INTRAVENOUS

## 2012-09-13 MED ORDER — NEOSTIGMINE METHYLSULFATE 1 MG/ML IJ SOLN
INTRAMUSCULAR | Status: DC | PRN
  Administered 2012-09-13: 5 mg via INTRAVENOUS

## 2012-09-13 MED ORDER — HYDROMORPHONE HCL PF 1 MG/ML IJ SOLN: 0.2500 mg | INTRAMUSCULAR | Status: DC | PRN

## 2012-09-13 MED ORDER — OXYCODONE-ACETAMINOPHEN 5-325 MG PO TABS
1.0000 | ORAL_TABLET | ORAL | Status: DC | PRN
  Administered 2012-09-13: 1 via ORAL
  Administered 2012-09-15 – 2012-09-18 (×3): 2 via ORAL
  Filled 2012-09-13: qty 2
  Filled 2012-09-13: qty 1
  Filled 2012-09-13 (×3): qty 2

## 2012-09-13 MED ORDER — LACTATED RINGERS IV SOLN: INTRAVENOUS | Status: DC

## 2012-09-13 MED ORDER — ROCURONIUM BROMIDE 100 MG/10ML IV SOLN
INTRAVENOUS | Status: DC | PRN
  Administered 2012-09-13: 10 mg via INTRAVENOUS
  Administered 2012-09-13: 50 mg via INTRAVENOUS
  Administered 2012-09-13 (×3): 10 mg via INTRAVENOUS

## 2012-09-13 MED ORDER — KCL IN DEXTROSE-NACL 20-5-0.45 MEQ/L-%-% IV SOLN
INTRAVENOUS | Status: DC
  Administered 2012-09-13 – 2012-09-15 (×5): via INTRAVENOUS
  Filled 2012-09-13 (×7): qty 1000

## 2012-09-13 MED ORDER — HEPARIN (PORCINE) IN NACL 100-0.45 UNIT/ML-% IJ SOLN
2300.0000 [IU]/h | INTRAMUSCULAR | Status: DC
  Filled 2012-09-13: qty 250

## 2012-09-13 MED ORDER — LACTATED RINGERS IV SOLN
INTRAVENOUS | Status: DC | PRN
  Administered 2012-09-13: 1000 mL

## 2012-09-13 SURGICAL SUPPLY — 49 items
APPLIER CLIP ROT 10 11.4 M/L (STAPLE)
BENZOIN TINCTURE PRP APPL 2/3 (GAUZE/BANDAGES/DRESSINGS) ×2 IMPLANT
CANISTER SUCTION 2500CC (MISCELLANEOUS) ×2 IMPLANT
CLIP APPLIE ROT 10 11.4 M/L (STAPLE) IMPLANT
CLOTH BEACON ORANGE TIMEOUT ST (SAFETY) ×2 IMPLANT
CUTTER FLEX LINEAR 45M (STAPLE) IMPLANT
DECANTER SPIKE VIAL GLASS SM (MISCELLANEOUS) ×2 IMPLANT
DERMABOND ADVANCED (GAUZE/BANDAGES/DRESSINGS) ×1
DERMABOND ADVANCED .7 DNX12 (GAUZE/BANDAGES/DRESSINGS) ×1 IMPLANT
DRAIN CHANNEL RND F F (WOUND CARE) ×2 IMPLANT
DRAPE LAPAROSCOPIC ABDOMINAL (DRAPES) ×2 IMPLANT
DRSG TEGADERM 2-3/8X2-3/4 SM (GAUZE/BANDAGES/DRESSINGS) IMPLANT
DRSG TEGADERM 4X4.75 (GAUZE/BANDAGES/DRESSINGS) IMPLANT
ELECT REM PT RETURN 9FT ADLT (ELECTROSURGICAL) ×2
ELECTRODE REM PT RTRN 9FT ADLT (ELECTROSURGICAL) ×1 IMPLANT
ENDOLOOP SUT PDS II  0 18 (SUTURE)
ENDOLOOP SUT PDS II 0 18 (SUTURE) IMPLANT
EVACUATOR SILICONE 100CC (DRAIN) ×2 IMPLANT
GLOVE BIO SURGEON STRL SZ 6 (GLOVE) ×4 IMPLANT
GLOVE BIOGEL PI IND STRL 6.5 (GLOVE) ×1 IMPLANT
GLOVE BIOGEL PI IND STRL 7.0 (GLOVE) ×1 IMPLANT
GLOVE BIOGEL PI INDICATOR 6.5 (GLOVE) ×1
GLOVE BIOGEL PI INDICATOR 7.0 (GLOVE) ×1
GLOVE INDICATOR 6.5 STRL GRN (GLOVE) ×4 IMPLANT
GOWN PREVENTION PLUS XXLARGE (GOWN DISPOSABLE) ×2 IMPLANT
KIT BASIN OR (CUSTOM PROCEDURE TRAY) ×2 IMPLANT
NS IRRIG 1000ML POUR BTL (IV SOLUTION) ×2 IMPLANT
PENCIL BUTTON HOLSTER BLD 10FT (ELECTRODE) IMPLANT
POUCH SPECIMEN RETRIEVAL 10MM (ENDOMECHANICALS) ×2 IMPLANT
RELOAD 45 VASCULAR/THIN (ENDOMECHANICALS) ×2 IMPLANT
RELOAD STAPLE TA45 3.5 REG BLU (ENDOMECHANICALS) ×2 IMPLANT
SCALPEL HARMONIC ACE (MISCELLANEOUS) ×2 IMPLANT
SET IRRIG TUBING LAPAROSCOPIC (IRRIGATION / IRRIGATOR) ×2 IMPLANT
SHEARS CURVED HARMONIC AC 45CM (MISCELLANEOUS) ×2 IMPLANT
SOLUTION ANTI FOG 6CC (MISCELLANEOUS) ×2 IMPLANT
SPONGE GAUZE 4X4 12PLY (GAUZE/BANDAGES/DRESSINGS) ×2 IMPLANT
STRIP CLOSURE SKIN 1/2X4 (GAUZE/BANDAGES/DRESSINGS) ×2 IMPLANT
SUT ETHILON 2 0 PS N (SUTURE) ×2 IMPLANT
SUT MNCRL AB 4-0 PS2 18 (SUTURE) ×2 IMPLANT
SUT VICRYL 0 ENDOLOOP (SUTURE) IMPLANT
SUT VICRYL 0 UR6 27IN ABS (SUTURE) ×2 IMPLANT
TAPE CLOTH SURG 4X10 WHT LF (GAUZE/BANDAGES/DRESSINGS) ×2 IMPLANT
TOWEL OR 17X26 10 PK STRL BLUE (TOWEL DISPOSABLE) ×2 IMPLANT
TRAY FOLEY CATH 14FRSI W/METER (CATHETERS) IMPLANT
TRAY LAP CHOLE (CUSTOM PROCEDURE TRAY) ×2 IMPLANT
TROCAR BLADELESS OPT 5 75 (ENDOMECHANICALS) ×4 IMPLANT
TROCAR XCEL BLUNT TIP 100MML (ENDOMECHANICALS) ×2 IMPLANT
TROCAR XCEL NON-BLD 11X100MML (ENDOMECHANICALS) IMPLANT
TUBING INSUFFLATION 10FT LAP (TUBING) ×2 IMPLANT

## 2012-09-13 NOTE — Progress Notes (Signed)
Agree with above.   Plan lap appy.  Plan resumption of anticoagulation tonite.  Hope to restart coumadin tomorrow.  Discussed risks, benefits.

## 2012-09-13 NOTE — Anesthesia Postprocedure Evaluation (Signed)
Anesthesia Post Note  Patient: Gregory Cortez  Procedure(s) Performed: Procedure(s) (LRB): APPENDECTOMY LAPAROSCOPIC (N/A)  Anesthesia type: General  Patient location: PACU  Post pain: Pain level controlled  Post assessment: Post-op Vital signs reviewed  Last Vitals:  Filed Vitals:   09/13/12 1751  BP: 150/82  Pulse: 76  Temp: 36.7 C  Resp: 18    Post vital signs: Reviewed  Level of consciousness: sedated  Complications: No apparent anesthesia complications

## 2012-09-13 NOTE — Progress Notes (Signed)
Subjective: Soft, no distress. No new discomfort.  Objective: Vital signs in last 24 hours: Temp:  [97.5 F (36.4 C)-98.8 F (37.1 C)] 97.6 F (36.4 C) (09/26 0515) Pulse Rate:  [60-67] 65  (09/26 0515) Resp:  [18] 18  (09/26 0515) BP: (109-146)/(51-90) 128/90 mmHg (09/26 0515) SpO2:  [91 %-95 %] 95 % (09/26 0515) Weight:  [147.056 kg (324 lb 3.2 oz)] 147.056 kg (324 lb 3.2 oz) (09/26 0522)   Afebrile, VSS, WBC is normal, INR 1.39 Intake/Output from previous day: 09/25 0701 - 09/26 0700 In: 783 [I.V.:450; Blood:333] Out: -  Intake/Output this shift:    General appearance: alert, cooperative and no distress GI: soft, sore in RLQ, no distension.  Lab Results:   Basename 09/13/12 0446 09/12/12 0455  WBC 6.3 6.3  HGB 14.2 13.7  HCT 42.4 40.7  PLT 272 230    BMET  Basename 09/13/12 0446 09/11/12 1955  NA 137 137  K 3.9 3.9  CL 102 102  CO2 26 24  GLUCOSE 99 87  BUN 8 11  CREATININE 0.85 0.76  CALCIUM 9.4 9.2   PT/INR  Basename 09/13/12 0446 09/12/12 1954  LABPROT 16.7* 17.5*  INR 1.39 1.48    No results found for this basename: AST:5,ALT:5,ALKPHOS:5,BILITOT:5,PROT:5,ALBUMIN:5 in the last 168 hours   Lipase  No results found for this basename: lipase     Studies/Results: Ct Abdomen Pelvis W Contrast  09/11/2012  *RADIOLOGY REPORT*  Clinical Data: Right lower quadrant and flank pain for 1 week. Evaluate for appendicitis.  CT ABDOMEN AND PELVIS WITH CONTRAST  Technique:  Multidetector CT imaging of the abdomen and pelvis was performed following the standard protocol during bolus administration of intravenous contrast.  Contrast: OMNIPAQUE IOHEXOL 300 MG/ML  SOLN  Comparison: None.  Findings: Clear lung bases.  Mild cardiomegaly.  A small to moderate hiatal hernia.  Normal liver, spleen, stomach, pancreas, gallbladder, biliary tract, adrenal glands, kidneys.  No retroperitoneal or retrocrural adenopathy.  Normal colon and terminal ileum.  The proximal  appendix is inflamed, with an appendicolith within.  Example image 47.  The appendiceal tip is relatively uninvolved, and is air filled.  There is no perforation or surrounding abscess. There are prominent nodes in the ileocolic mesentery; likely reactive.  Normal small bowel without abdominal ascites.    No pelvic adenopathy.    Normal urinary bladder and prostate.  No significant free fluid.  No acute osseous abnormality.  IMPRESSION:  1.  Findings of uncomplicated moderate appendicitis. These results will be called to the ordering clinician or representative by the Radiologist Assistant, and communication documented in the PACS Dashboard. 2. Small to moderate hiatal hernia.   Original Report Authenticated By: Consuello Bossier, M.D.     Medications:    . cefOXitin  2 g Intravenous Q8H  . folic acid  1 mg Oral Daily  . heparin  3,000 Units Intravenous Once  . influenza  inactive virus vaccine  0.5 mL Intramuscular Tomorrow-1000  . lip balm  1 application Topical BID  . multivitamin with minerals  1 tablet Oral Daily  . pantoprazole  40 mg Oral Daily  . phytonadione (VITAMIN K) IV  2 mg Intravenous Once    Assessment/Plan Appendicitis on CT  Hx of PE, fully anticoagulated/hx of hypercoagulable state  Obesity BMI 47.5  Sleep apnea  GERD  Hx of anxiety   Plan:  Hold heparin at 9AM and remove appendix later today.   LOS: 2 days    Cassell Voorhies 09/13/2012  `

## 2012-09-13 NOTE — Progress Notes (Addendum)
ANTICOAGULATION CONSULT NOTE - Follow Up Consult  Pharmacy Consult for:  IV Heparin Indication:  On chronic Coumadin for history of PE and hypercoagulable state; bridging after surgery on 9/26  No Known Allergies  Patient Measurements: Height: 5' 10.5" (179.1 cm) Weight: 324 lb 3.2 oz (147.056 kg) IBW/kg (Calculated) : 74.15    Vital Signs: Temp: 98.3 F (36.8 C) (09/26 1926) Temp src: Oral (09/26 1926) BP: 142/83 mmHg (09/26 1926) Pulse Rate: 68  (09/26 1926)  Labs:  Basename 09/13/12 0446 09/12/12 1954 09/12/12 0455 09/11/12 1955  HGB 14.2 -- 13.7 --  HCT 42.4 -- 40.7 43.8  PLT 272 -- 230 274  APTT -- -- -- 42*  LABPROT 16.7* 17.5* 23.0* --  INR 1.39 1.48 2.14* --  HEPARINUNFRC 0.27* -- -- --  CREATININE 0.85 -- -- 0.76  CKTOTAL -- -- -- --  CKMB -- -- -- --  TROPONINI -- -- -- --    Estimated Creatinine Clearance: 147 ml/min (by C-G formula based on Cr of 0.85).  Assessment:  Assisting with IV heparin for this 56 year-old male for bridging to Coumadin.  Laparoscopic appendectomy performed today with anesthesia end time 17:04.  Called on-call surgeon, Dr. Abbey Chatters, to learn when the heparin should be resumed.  However, later learned that Dr. Donell Beers had issued a nursing order for heparin to be restarted at 2300.  We will follow Dr. Arita Miss order.  Prior to surgery, infusing heparin at 2000 units/hr produced a level of 0.27 units/ml -- slightly below the therapeutic range.  Goals of Therapy:   Heparin level 0.3-0.7 units/ml  Monitor platelets by anticoagulation protocol: Yes  Prevention of VTE   Plan:   Restart heparin infusion at 23:00 9/26 to infuse at 2300 units/hr (no bolus dose).  Heparin level 6 hours after infusion started and daily thereafter  CBC daily while on heparin.  Polo Riley R.Ph. 09/13/2012  8:29 PM  .Addendum:  Assessement:  HL=0.34 units/ml after 2300 units/hr @ [facility].  No bleeding/IV interuptions reported per  RN.  Plan:  Continue heparin @ current rate.  Recheck HL later today to ensure stays in range.  Lorenza Evangelist 09/14/2012 6:19 AM

## 2012-09-13 NOTE — Op Note (Signed)
Appendectomy, Lap, Procedure Note  Indications: The patient presented with a history of right-sided abdominal pain. A CT revealed findings consistent with acute appendicitis.  Pre-operative Diagnosis: Sub acute phlegmonous appendicitis without mention of peritonitis  Post-operative Diagnosis: Same  Surgeon: [facility]   Assistants: Darnell Level, MD  Anesthesia: General endotracheal anesthesia and Local anesthesia 1% buffered lidocaine, 0.25.% bupivacaine, with epinephrine  ASA Class: 3  Procedure Details  The patient was seen again in the Holding Room. The risks, benefits, complications, treatment options, and expected outcomes were discussed with the patient and/or family. The possibilities of perforation of viscus, bleeding, recurrent infection, the need for additional procedures, failure to diagnose a condition, and creating a complication requiring transfusion or operation were discussed. There was concurrence with the proposed plan and informed consent was obtained. The site of surgery was properly noted. The patient was taken to Operating Room, identified as Gregory Cortez and the procedure verified as Appendectomy. A Time Out was held and the above information confirmed.  The patient was placed in the supine position and general anesthesia was induced, along with placement of orogastric tube, Venodyne boots, and a Foley catheter. The abdomen was prepped and draped in a sterile fashion. The patient was placed into reverse trendelenburg position and rotated to the right.  Local anesthetic was infiltrated into the infraumbilical location. The fascia was elevated with Kocher clamps and entered with the #11 blade.  The Hasson was inserted into the abdomen.    The pneumoperitoneum was then established to steady pressure of 15 mmHg.  Additional 5 mm cannulas then placed in the left lower quadrant of the abdomen and the suprapubic region under direct visualization. Another 5 mm port was placed  in the RUQ.  Dr. Gerrit Friends was called for assistance.   A careful evaluation of the entire abdomen was carried out. The patient was placed in Trendelenburg and rotated to the left.  . The small intestines were retracted in the cephalad and left lateral direction away from the pelvis and right lower quadrant. The patient was found to have an enlarged and inflamed appendix that was extending into the pelvis. There was no evidence of perforation.  The appendix was retrocecal, and it was a hard tube that was not able to be grasped.    The appendix was carefully dissected with a combination of the harmonic and blunt dissection. The appendix was was skeletonized with the harmonic scalpel.   The stump was skeletonized.  The appendix was divided at its base with a stump of cecum using 2 loads of an endo-GIA stapler.  The appendix was then dissected from the surrounding tissue with the harmonic.  It was  removed from the abdomen with an Endocatch bag through the umbilical port.    There was no evidence of bleeding, leakage, or complication after division of the appendix. Irrigation was also performed and irrigate suctioned from the abdomen as well.  The 5 mm trocars were removed.  The pneumoperitoneum was evacuated from the abdomen.    The trocar site skin wounds were closed with 4-0 Monocryl and dressed with Dermabond.  Instrument, sponge, and needle counts were correct at the conclusion of the case.   Findings: The appendix was found to be retrocecal and inflamed with woody inflammation.  It was very firm, indurated, and dilated. There were not signs of necrosis.  There was not perforation. There was not abscess formation.  Estimated Blood Loss:  Minimal         Drains:  19 Fr          Specimens: appendix to pathology         Complications:  None; patient tolerated the procedure well.         Disposition: PACU - hemodynamically stable.         Condition: stable

## 2012-09-13 NOTE — Progress Notes (Signed)
Pt states he maintains his own cpap machine. Pt was encouraged to let nursing know if he needs respiratory!

## 2012-09-13 NOTE — Anesthesia Preprocedure Evaluation (Addendum)
Anesthesia Evaluation  Patient identified by MRN, date of birth, ID band Patient awake    Reviewed: Allergy & Precautions, H&P , NPO status , Patient's Chart, lab work & pertinent test results  History of Anesthesia Complications (+) Emergence Delirium  Airway Mallampati: II TM Distance: >3 FB Neck ROM: Full    Dental  (+) Teeth Intact, Dental Advisory Given and Caps   Pulmonary neg pulmonary ROS, shortness of breath and with exertion, sleep apnea and Continuous Positive Airway Pressure Ventilation ,  breath sounds clear to auscultation        Cardiovascular Exercise Tolerance: Poor negative cardio ROS  Rhythm:Regular Rate:Normal  History of PE X 2; Rx with Coumadin.   Neuro/Psych negative neurological ROS  negative psych ROS   GI/Hepatic negative GI ROS, Neg liver ROS, GERD-  Medicated,  Endo/Other  negative endocrine ROSMorbid obesity  Renal/GU negative Renal ROS  negative genitourinary   Musculoskeletal negative musculoskeletal ROS (+)   Abdominal (+) + obese,   Peds  Hematology negative hematology ROS (+)   Anesthesia Other Findings   Reproductive/Obstetrics negative OB ROS                          Anesthesia Physical Anesthesia Plan  ASA: III and Emergent  Anesthesia Plan: General   Post-op Pain Management:    Induction: Intravenous  Airway Management Planned: Oral ETT  Additional Equipment:   Intra-op Plan:   Post-operative Plan: Extubation in OR  Informed Consent: I have reviewed the patients History and Physical, chart, labs and discussed the procedure including the risks, benefits and alternatives for the proposed anesthesia with the patient or authorized representative who has indicated his/her understanding and acceptance.   Dental advisory given  Plan Discussed with: CRNA  Anesthesia Plan Comments: (Patient states he is claustrophobic and dislikes being  restrained.)       Anesthesia Quick Evaluation

## 2012-09-13 NOTE — Transfer of Care (Signed)
Immediate Anesthesia Transfer of Care Note  Patient: Gregory Cortez  Procedure(s) Performed: Procedure(s) (LRB): APPENDECTOMY LAPAROSCOPIC (N/A)  Patient Location: PACU  Anesthesia Type: General  Level of Consciousness: sedated, patient cooperative and responds to stimulaton  Airway & Oxygen Therapy: Patient Spontanous Breathing and Patient connected to face mask oxgen  Post-op Assessment: Report given to PACU RN and Post -op Vital signs reviewed and stable  Post vital signs: Reviewed and stable  Complications: No apparent anesthesia complications

## 2012-09-13 NOTE — Preoperative (Signed)
Beta Blockers   Reason not to administer Beta Blockers:Not Applicable 

## 2012-09-14 ENCOUNTER — Encounter (HOSPITAL_COMMUNITY): Payer: Self-pay | Admitting: General Surgery

## 2012-09-14 LAB — PROTIME-INR
INR: 1.33 (ref 0.00–1.49)
INR: 1.41 (ref 0.00–1.49)
Prothrombin Time: 16.2 seconds — ABNORMAL HIGH (ref 11.6–15.2)
Prothrombin Time: 16.9 seconds — ABNORMAL HIGH (ref 11.6–15.2)

## 2012-09-14 LAB — CBC
HCT: 41 % (ref 39.0–52.0)
Hemoglobin: 14 g/dL (ref 13.0–17.0)
MCH: 31.3 pg (ref 26.0–34.0)
MCHC: 34.1 g/dL (ref 30.0–36.0)
MCV: 91.5 fL (ref 78.0–100.0)
Platelets: 248 10*3/uL (ref 150–400)
RBC: 4.48 MIL/uL (ref 4.22–5.81)
RDW: 12.8 % (ref 11.5–15.5)
WBC: 9.7 10*3/uL (ref 4.0–10.5)

## 2012-09-14 LAB — HEPARIN LEVEL (UNFRACTIONATED): Heparin Unfractionated: 0.34 IU/mL (ref 0.30–0.70)

## 2012-09-14 MED ORDER — ENOXAPARIN SODIUM 150 MG/ML ~~LOC~~ SOLN
150.0000 mg | Freq: Once | SUBCUTANEOUS | Status: AC
  Administered 2012-09-14: 150 mg via SUBCUTANEOUS
  Filled 2012-09-14: qty 1

## 2012-09-14 MED ORDER — WARFARIN SODIUM 7.5 MG PO TABS
15.0000 mg | ORAL_TABLET | ORAL | Status: AC
  Administered 2012-09-14: 15 mg via ORAL
  Filled 2012-09-14: qty 2

## 2012-09-14 MED ORDER — WARFARIN SODIUM 2.5 MG PO TABS
12.5000 mg | ORAL_TABLET | Freq: Every day | ORAL | Status: DC
  Administered 2012-09-15: 12.5 mg via ORAL
  Filled 2012-09-14 (×3): qty 1

## 2012-09-14 MED ORDER — ENOXAPARIN SODIUM 150 MG/ML ~~LOC~~ SOLN
150.0000 mg | Freq: Two times a day (BID) | SUBCUTANEOUS | Status: DC
  Administered 2012-09-14 – 2012-09-18 (×8): 150 mg via SUBCUTANEOUS
  Filled 2012-09-14 (×9): qty 1

## 2012-09-14 MED ORDER — WARFARIN - PHYSICIAN DOSING INPATIENT: Freq: Every day | Status: DC

## 2012-09-14 NOTE — Progress Notes (Signed)
ANTICOAGULATION CONSULT NOTE - Initial Consult  Pharmacy Consult for lovenox Indication: pulmonary embolus  No Known Allergies  Patient Measurements: Height: 5' 10.5" (179.1 cm) Weight: 324 lb 3.2 oz (147.056 kg) IBW/kg (Calculated) : 74.15  Heparin Dosing Weight:   Vital Signs: Temp: 97.6 F (36.4 C) (09/27 0526) Temp src: Axillary (09/27 0526) BP: 132/65 mmHg (09/27 0526) Pulse Rate: 79  (09/27 0205)  Labs:  Basename 09/14/12 0445 09/13/12 0446 09/12/12 1954 09/12/12 0455 09/11/12 1955  HGB 14.0 14.2 -- -- --  HCT 41.0 42.4 -- 40.7 --  PLT 248 272 -- 230 --  APTT -- -- -- -- 42*  LABPROT 16.2* 16.7* 17.5* -- --  INR 1.33 1.39 1.48 -- --  HEPARINUNFRC 0.34 0.27* -- -- --  CREATININE -- 0.85 -- -- 0.76  CKTOTAL -- -- -- -- --  CKMB -- -- -- -- --  TROPONINI -- -- -- -- --    Estimated Creatinine Clearance: 147 ml/min (by C-G formula based on Cr of 0.85).   Medical History: Past Medical History  Diagnosis Date  . OSA (obstructive sleep apnea)     dr Shelle Iron  . Testosterone deficiency     dr Patsi Sears, shots every 2 weeks  . ED (erectile dysfunction)   . GERD (gastroesophageal reflux disease)     [facility]  . Low back pain     dr Channing Mutters, dr Farris Has, dr Ethelene Hal, herniated disc L4-5  . PE (pulmonary embolism)     bilateral sep 2011 and again bilateral May 2012  . 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 Primary hypercoagulable state     no etiology found per Dr. Shirline Frees   . Hx of colonoscopy   . Pulmonary embolism 02/22/2010    CT angio Dx    Medications:  Scheduled:    . cefOXitin  2 g Intravenous Q8H  . folic acid  1 mg Oral Daily  . influenza  inactive virus vaccine  0.5 mL Intramuscular Tomorrow-1000  . lip balm  1 application Topical BID  . multivitamin with minerals  1 tablet Oral Daily  . pantoprazole  40 mg Oral Daily  . warfarin  15 mg Oral NOW  . Warfarin - Physician Dosing Inpatient   Does not apply q1800    Assessment: 75 YOM presents with  appendicitis on warfarin for h/o PE, bridged perioperatively with UFH, now orders to change to LMWH and resume warfarin. Pharmacy asked to dose enoxaparin. He is s/p appendectomy 9/26.  CBC WNL and no bleeding concerns known.   Goal of Therapy:  Anti-Xa level 0.6-1.2 units/ml 4hrs after LMWH dose given Monitor platelets by anticoagulation protocol: Yes   Plan:  -Begin lovenox 150mg  SQ q12h based on patient wt and est CrCl. D/C heparin gtt then give 1st dose of LMWH 1h after heparin turned off -CBC q72h while here on full dose LMWH -Warfarin 15mg  po x 1 ordered for today per [facility], dose appears appropriate based on home dosing. Daily INR ordered. -Possibly home tomorrow.   Dannielle Huh 09/14/2012,9:36 AM

## 2012-09-14 NOTE — Progress Notes (Signed)
1 Day Post-Op  Subjective: Real sore, but taking PO's well.  Objective: Vital signs in last 24 hours: Temp:  [97.1 F (36.2 C)-99 F (37.2 C)] 97.6 F (36.4 C) (09/27 0526) Pulse Rate:  [57-115] 79  (09/27 0205) Resp:  [18-22] 20  (09/27 0526) BP: (126-163)/(65-92) 132/65 mmHg (09/27 0526) SpO2:  [92 %-98 %] 98 % (09/27 0526) Last BM Date: 09/13/12  Intake/Output from previous day: 09/26 0701 - 09/27 0700 In: 2000 [I.V.:2000] Out: 1040 [Urine:700; Drains:140; Blood:200] Intake/Output this shift:    General appearance: alert, cooperative and no distress Resp: clear to auscultation bilaterally GI: soft, wounds are fine he's tolerating PO's well, very sore and tender.  Lab Results:   Basename 09/14/12 0445 09/13/12 0446  WBC 9.7 6.3  HGB 14.0 14.2  HCT 41.0 42.4  PLT 248 272    BMET  Basename 09/13/12 0446 09/11/12 1955  NA 137 137  K 3.9 3.9  CL 102 102  CO2 26 24  GLUCOSE 99 87  BUN 8 11  CREATININE 0.85 0.76  CALCIUM 9.4 9.2   PT/INR  Basename 09/14/12 0445 09/13/12 0446  LABPROT 16.2* 16.7*  INR 1.33 1.39    No results found for this basename: AST:5,ALT:5,ALKPHOS:5,BILITOT:5,PROT:5,ALBUMIN:5 in the last 168 hours   Lipase  No results found for this basename: lipase     Studies/Results: No results found.  Medications:    . cefOXitin  2 g Intravenous Q8H  . folic acid  1 mg Oral Daily  . influenza  inactive virus vaccine  0.5 mL Intramuscular Tomorrow-1000  . lip balm  1 application Topical BID  . multivitamin with minerals  1 tablet Oral Daily  . pantoprazole  40 mg Oral Daily    Assessment/Plan Appendicitis on CT, s/p lap appy 09/13/12. Hx of PE, fully anticoagulated/hx of hypercoagulable state  Obesity BMI 47.5  Sleep apnea  GERD  Hx of anxiety    Plan:  Switch to Lovenox and restart coumadin. He take it in the AM.  Mobilize, and if OK home tomorrow. I will give him 15 mg now and then let pharmacy handle his lovenox.  He will need  to go home on lovenox to bridge till coumadin is therapeutic.  He is on 10 mg alternating with 12.5 mg of coumadin at home.    LOS: 3 days    Madailein Londo 09/14/2012

## 2012-09-14 NOTE — Progress Notes (Signed)
Doing well.   Tolerating diet. Start coumadin today.   Hopefully home tomorrow.

## 2012-09-15 LAB — PROTIME-INR
INR: 1.56 — ABNORMAL HIGH (ref 0.00–1.49)
Prothrombin Time: 18.2 seconds — ABNORMAL HIGH (ref 11.6–15.2)

## 2012-09-15 LAB — CBC
HCT: 41.3 % (ref 39.0–52.0)
Hemoglobin: 13.8 g/dL (ref 13.0–17.0)
MCH: 30.5 pg (ref 26.0–34.0)
MCHC: 33.4 g/dL (ref 30.0–36.0)
MCV: 91.4 fL (ref 78.0–100.0)
Platelets: 249 10*3/uL (ref 150–400)
RBC: 4.52 MIL/uL (ref 4.22–5.81)
RDW: 12.6 % (ref 11.5–15.5)
WBC: 12 10*3/uL — ABNORMAL HIGH (ref 4.0–10.5)

## 2012-09-15 NOTE — Progress Notes (Signed)
2 Days Post-Op  Subjective: Real sore, but taking PO's.  Objective: Vital signs in last 24 hours: Temp:  [97.6 F (36.4 C)-100.1 F (37.8 C)] 98.3 F (36.8 C) (09/28 1016) Pulse Rate:  [80-89] 86  (09/28 0439) Resp:  [18-20] 18  (09/28 0439) BP: (132-156)/(65-84) 156/84 mmHg (09/28 0439) SpO2:  [90 %-98 %] 98 % (09/28 0439) Weight:  [317 lb (143.79 kg)] 317 lb (143.79 kg) (09/28 0900) Last BM Date: 09/13/12  Intake/Output from previous day: 09/27 0701 - 09/28 0700 In: 1230 [P.O.:480; I.V.:750] Out: 95 [Drains:95] Intake/Output this shift: Total I/O In: 240 [P.O.:240] Out: -   General appearance: alert, cooperative and no distress Resp: clear to auscultation bilaterally GI: soft, wounds are fine he's tolerating PO's well, very sore and tender.  Lab Results:   Basename 09/15/12 0555 09/14/12 0445  WBC 12.0* 9.7  HGB 13.8 14.0  HCT 41.3 41.0  PLT 249 248    BMET  Basename 09/13/12 0446  NA 137  K 3.9  CL 102  CO2 26  GLUCOSE 99  BUN 8  CREATININE 0.85  CALCIUM 9.4   PT/INR  Basename 09/15/12 0555 09/14/12 1730  LABPROT 18.2* 16.9*  INR 1.56* 1.41    No results found for this basename: AST:5,ALT:5,ALKPHOS:5,BILITOT:5,PROT:5,ALBUMIN:5 in the last 168 hours   Lipase  No results found for this basename: lipase     Studies/Results: No results found.  Medications:    . cefOXitin  2 g Intravenous Q8H  . enoxaparin (LOVENOX) injection  150 mg Subcutaneous Once  . enoxaparin (LOVENOX) injection  150 mg Subcutaneous Q12H  . folic acid  1 mg Oral Daily  . lip balm  1 application Topical BID  . multivitamin with minerals  1 tablet Oral Daily  . pantoprazole  40 mg Oral Daily  . warfarin  12.5 mg Oral q1800  . warfarin  15 mg Oral NOW  . Warfarin - Physician Dosing Inpatient   Does not apply q1800    Assessment/Plan Appendicitis on CT, s/p lap appy 09/13/12. Hx of PE, fully anticoagulated/hx of hypercoagulable state  Obesity BMI 47.5  Sleep apnea    GERD  Hx of anxiety    Plan:  Cont Lovenox and restart coumadin. INR responding.  Ambulate, and if OK home tomorrow.  He will need to go home on lovenox to bridge till coumadin is therapeutic.  He is on 10 mg alternating with 12.5 mg of coumadin at home.  Suppository for constipation  LOS: 4 days    Gregory Cortez C. 09/15/2012

## 2012-09-16 ENCOUNTER — Inpatient Hospital Stay (HOSPITAL_COMMUNITY): Payer: BC Managed Care – PPO

## 2012-09-16 LAB — PROTIME-INR
INR: 1.44 (ref 0.00–1.49)
Prothrombin Time: 17.2 seconds — ABNORMAL HIGH (ref 11.6–15.2)

## 2012-09-16 MED ORDER — WARFARIN SODIUM 7.5 MG PO TABS
15.0000 mg | ORAL_TABLET | Freq: Every day | ORAL | Status: DC
  Administered 2012-09-16 – 2012-09-17 (×2): 15 mg via ORAL
  Filled 2012-09-16 (×3): qty 2

## 2012-09-16 MED ORDER — WARFARIN SODIUM 7.5 MG PO TABS
15.0000 mg | ORAL_TABLET | Freq: Once | ORAL | Status: DC
  Filled 2012-09-16: qty 2

## 2012-09-16 NOTE — Progress Notes (Signed)
3 Days Post-Op  Subjective: Still sore, taking PO's but states moderate abd pain after eating, relieved with narcotics.  Ambulating better.  C/O shortness of breath.  Still no BM, having flatus  Objective: Vital signs in last 24 hours: Temp:  [97.9 F (36.6 C)-98.5 F (36.9 C)] 97.9 F (36.6 C) (09/29 0607) Pulse Rate:  [80-89] 80  (09/29 0607) Resp:  [18-20] 18  (09/29 0607) BP: (111-137)/(68-85) 137/85 mmHg (09/29 0607) SpO2:  [90 %-94 %] 94 % (09/29 0607) Last BM Date: 09/13/12  Intake/Output from previous day: 09/28 0701 - 09/29 0700 In: 1075 [P.O.:540; I.V.:535] Out: 40 [Drains:40] Intake/Output this shift:    General appearance: alert, cooperative and no distress Resp: clear to auscultation bilaterally GI: soft, wounds are fine he's tolerating PO's well, very sore and tender.  Lab Results:   Basename 09/15/12 0555 09/14/12 0445  WBC 12.0* 9.7  HGB 13.8 14.0  HCT 41.3 41.0  PLT 249 248    BMET No results found for this basename: NA:2,K:2,CL:2,CO2:2,GLUCOSE:2,BUN:2,CREATININE:2,CALCIUM:2 in the last 72 hours PT/INR  Basename 09/16/12 0538 09/15/12 0555  LABPROT 17.2* 18.2*  INR 1.44 1.56*    No results found for this basename: AST:5,ALT:5,ALKPHOS:5,BILITOT:5,PROT:5,ALBUMIN:5 in the last 168 hours   Lipase  No results found for this basename: lipase     Studies/Results: No results found.  Medications:    . cefOXitin  2 g Intravenous Q8H  . enoxaparin (LOVENOX) injection  150 mg Subcutaneous Q12H  . folic acid  1 mg Oral Daily  . lip balm  1 application Topical BID  . multivitamin with minerals  1 tablet Oral Daily  . pantoprazole  40 mg Oral Daily  . warfarin  15 mg Oral q1800  . Warfarin - Physician Dosing Inpatient   Does not apply q1800  . DISCONTD: warfarin  12.5 mg Oral q1800    Assessment/Plan Appendicitis on CT, s/p lap appy 09/13/12. Hx of PE, fully anticoagulated/hx of hypercoagulable state  Obesity BMI 47.5  Sleep apnea  GERD  Hx  of anxiety    Plan:  Cont Lovenox and coumadin. INR decreased today- will give 15mg  tonight.  Pt c/o SOB- oxygenation has been good.  Will get CXR to evaluate.  Will stop IVF's. Pt concerned that he could develop another  PE.  I explained to him that would be rare on theraputic Lovenox.  Ambulate, possible home tomorrow if feeling better.  He will need to go home on lovenox to bridge till coumadin is therapeutic.  He is on 10 mg alternating with 12.5 mg of coumadin at home.  Suppository prn for constipation  LOS: 5 days    Farhad Burleson C. 09/16/2012

## 2012-09-17 ENCOUNTER — Other Ambulatory Visit: Payer: BC Managed Care – PPO | Admitting: Lab

## 2012-09-17 ENCOUNTER — Ambulatory Visit: Payer: BC Managed Care – PPO | Admitting: Hematology & Oncology

## 2012-09-17 LAB — PROTIME-INR
INR: 1.44 (ref 0.00–1.49)
Prothrombin Time: 17.2 seconds — ABNORMAL HIGH (ref 11.6–15.2)

## 2012-09-17 MED ORDER — POLYETHYLENE GLYCOL 3350 17 G PO PACK
17.0000 g | PACK | Freq: Every day | ORAL | Status: DC
  Administered 2012-09-17 – 2012-09-18 (×2): 17 g via ORAL
  Filled 2012-09-17 (×2): qty 1

## 2012-09-17 NOTE — Progress Notes (Signed)
Rt spoke with patient and he said he will put his CPAP on himself.   Rt told him that if he needed any help to please call.

## 2012-09-17 NOTE — Progress Notes (Signed)
INR HAS NOT BUMPED.  Continue lovanox and coumadin.  Drain out [employer].

## 2012-09-17 NOTE — Progress Notes (Signed)
4 Days Post-Op  Subjective: Constipated still tender.    Objective: Vital signs in last 24 hours: Temp:  [97.6 F (36.4 C)-99.3 F (37.4 C)] 98.7 F (37.1 C) (09/30 0600) Pulse Rate:  [76-100] 76  (09/30 0600) Resp:  [16-18] 18  (09/30 0600) BP: (114-139)/(74-79) 122/74 mmHg (09/30 0600) SpO2:  [92 %-95 %] 95 % (09/30 0600) Last BM Date: 09/16/12  480 po RECORDED, 40 ml thru drain. Afebrile, VSS, INR 1.44,  Intake/Output from previous day: 09/29 0701 - 09/30 0700 In: 3760.2 [P.O.:480; I.V.:3280.2] Out: 40 [Drains:40] Intake/Output this shift:    General appearance: alert, cooperative and no distress GI: soft, non-tender; bowel sounds normal; no masses,  no organomegaly and incision looks good, drainage is clear serous, and dressing is wet, some leakage around the drain.  Lab Results:   Basename 09/15/12 0555  WBC 12.0*  HGB 13.8  HCT 41.3  PLT 249    BMET No results found for this basename: NA:2,K:2,CL:2,CO2:2,GLUCOSE:2,BUN:2,CREATININE:2,CALCIUM:2 in the last 72 hours PT/INR  Basename 09/17/12 0445 09/16/12 0538  LABPROT 17.2* 17.2*  INR 1.44 1.44    No results found for this basename: AST:5,ALT:5,ALKPHOS:5,BILITOT:5,PROT:5,ALBUMIN:5 in the last 168 hours   Lipase  No results found for this basename: lipase     Studies/Results: Dg Chest 1 View  09/16/2012  *RADIOLOGY REPORT*  Clinical Data: Shortness of breath and recent appendectomy. History of pulmonary embolism 2011.  CHEST - 1 VIEW  Comparison: CT of 11/15/2011 plain film 11/15/2011.  Findings: Mildly degraded exam due to AP portable technique and patient body habitus.  Cardiomegaly accentuated by AP portable technique.  Mild right hemidiaphragm elevation.  No left and no definite right pleural effusion. No pneumothorax.  Low lung volumes with resultant pulmonary interstitial prominence.  Mild volume loss at the lung bases.  IMPRESSION: Cardiomegaly and low lung volumes, without acute disease.   Original  Report Authenticated By: Consuello Bossier, M.D.     Medications:    . cefOXitin  2 g Intravenous Q8H  . enoxaparin (LOVENOX) injection  150 mg Subcutaneous Q12H  . folic acid  1 mg Oral Daily  . lip balm  1 application Topical BID  . multivitamin with minerals  1 tablet Oral Daily  . pantoprazole  40 mg Oral Daily  . warfarin  15 mg Oral q1800  . Warfarin - Physician Dosing Inpatient   Does not apply q1800  . DISCONTD: warfarin  12.5 mg Oral q1800  . DISCONTD: warfarin  15 mg Oral ONCE-1800    Assessment/Plan Appendicitis on CT, s/p lap appy 09/13/12.  Hx of PE, fully anticoagulated/hx of hypercoagulable state  Obesity BMI 47.5  Sleep apnea  GERD  Hx of anxiety   Plan:  Miralax, continue to mobilize, redress drain, plan to remove tomorrow prior to D/C.  6 days of antibiotics started 9/24.  Continue lovenox and coumadin.  LOS: 6 days    Gregory Cortez 09/17/2012

## 2012-09-17 NOTE — Progress Notes (Addendum)
ANTICOAGULATION CONSULT NOTE - Initial Consult  Pharmacy Consult for lovenox Indication: pulmonary embolus  No Known Allergies  Patient Measurements: Height: 5' 10.5" (179.1 cm) Weight: 317 lb (143.79 kg) IBW/kg (Calculated) : 74.15  Heparin Dosing Weight:   Vital Signs: Temp: 98.7 F (37.1 C) (09/30 0600) Temp src: Oral (09/30 0600) BP: 122/74 mmHg (09/30 0600) Pulse Rate: 76  (09/30 0600)  Labs:  Basename 09/17/12 0445 09/16/12 0538 09/15/12 0555  HGB -- -- 13.8  HCT -- -- 41.3  PLT -- -- 249  APTT -- -- --  LABPROT 17.2* 17.2* 18.2*  INR 1.44 1.44 1.56*  HEPARINUNFRC -- -- --  CREATININE -- -- --  CKTOTAL -- -- --  CKMB -- -- --  TROPONINI -- -- --    Estimated Creatinine Clearance: 145 ml/min (by C-G formula based on Cr of 0.85).   Medical History: Past Medical History  Diagnosis Date  . OSA (obstructive sleep apnea)     dr Shelle Iron  . Testosterone deficiency     dr Patsi Sears, shots every 2 weeks  . ED (erectile dysfunction)   . GERD (gastroesophageal reflux disease)     [facility]  . Low back pain     dr Channing Mutters, dr Farris Has, dr Ethelene Hal, herniated disc L4-5  . PE (pulmonary embolism)     bilateral sep 2011 and again bilateral May 2012  . 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 Primary hypercoagulable state     no etiology found per Dr. Shirline Frees   . Hx of colonoscopy   . Pulmonary embolism 02/22/2010    CT angio Dx    Medications:  Scheduled:     . cefOXitin  2 g Intravenous Q8H  . enoxaparin (LOVENOX) injection  150 mg Subcutaneous Q12H  . folic acid  1 mg Oral Daily  . lip balm  1 application Topical BID  . multivitamin with minerals  1 tablet Oral Daily  . pantoprazole  40 mg Oral Daily  . warfarin  15 mg Oral q1800  . Warfarin - Physician Dosing Inpatient   Does not apply q1800  . DISCONTD: warfarin  12.5 mg Oral q1800  . DISCONTD: warfarin  15 mg Oral ONCE-1800    Assessment: 89 YOM presents with appendicitis on warfarin for h/o PE, bridged  perioperatively with UFH, now orders to change to LMWH and resume warfarin. Pharmacy asked to dose enoxaparin. He is s/p appendectomy 9/26.  CBC WNL and no bleeding concerns known.   INR this am unchanged at 1.44, coumadin 15mg  PO given last pm  Goal of Therapy:  Anti-Xa level 0.6-1.2 units/ml 4hrs after LMWH dose given Monitor platelets by anticoagulation protocol: Yes INR=2-3   Plan:  -Continue lovenox 150mg  SQ q12h based on patient wt and est CrCl. -CBC q72h while here on full dose LMWH -Warfarin per [facility], dose increased to 15mg  PO daily (appropriate based on home dose). IF goes home, would send on 15mg  daily w/ INR check in 2-3 days - Daily INR  Dannielle Huh 09/17/2012,9:02 AM

## 2012-09-17 NOTE — Progress Notes (Signed)
Physician Discharge Summary  Patient ID: Gregory Cortez MRN: [id number] DOB/AGE: 54-Nov-1957 54 y.o.  Admit date: 09/11/2012 Discharge date: 09/18/2012  Admission Diagnoses: Appendicitis on CT  Discharge Diagnoses: Appendicitis with Sub acute phlegmonous appendicitis without mention of peritonitis    Hx of PE, fully anticoagulated/hx of hypercoagulable state  Obesity BMI 47.5  Sleep apnea  GERD  Hx of anxiety  Principal Problem:  *Appendicitis with peritonitis on CT Active Problems:  Anxiety state, unspecified  SLEEP APNEA, OBSTRUCTIVE  GERD  ERECTILE DYSFUNCTION, ORGANIC  Chronic anticoagulation for h/o Pulmonary embolism  Obesity, Class III, BMI 40-49.9 (morbid obesity)   PROCEDURES: Laparoscopic Appendectomy 09/13/12, [facility]  Hospital Course: Morbidly obese male on chronic anticoagulation for pulmonary embolism. Felto have abdominal pain for the past week. More right-sided. Has become more intense. Somewhat decreased appetite. Mild nausea. No emesis. No prior history of any abdominal surgery. No personal nor family history of GI/colon cancer, inflammatory bowel disease, irritable bowel syndrome, allergy such as Celiac Sprue, dietary/dairy problems, colitis, ulcers nor gastritis. No recent sick contacts/gastroenteritis. No travel outside the country. No changes in diet.  Based on concerns of persistent abdominal pain, he went to his primary care physician. Dr. Clent Ridges was concerned about appendicitis. CT scan was ordered & done this afternoon. He was recommend he go to the emergency room. He was in the waiting area for several hours. I was called and saw the patient within 20 minutes. Wife in room He was admitted by Dr. Michaell Cowing after reviewing CT, showing uncomplicated Appendicitis. INR was 2.11, and 2.14 the following AM.  His coumadin was reversed with FFP and Vitamin K.  He was covered with Lovenox which was later converted to IV heparin for surgery the following day. His  appendectomy was very difficult, and a drain was placed. He was restarted on heparin that evening without a bolus, then he was restarted on Lovenox and coumadin.  He has had some pain and post op constipation.  He has been slow to come up to therapeutic range on his coumadin, and we have been giving him dose of 15 mg po daily.  He is getting miralax today for constipation.  If he does well and feels better we will plan to send him home on Lovenox bridge and home coumadin dose. We do not plan to send home on antibiotics. He has had a BM drain was removed prior to discharge. He is on Lovenox 150 mg BID and we will send him with 3 days worth.  He is to get his coumadin checked in 48 hours by his primary or DR. Ennever.  We are going to tell him to take 15 mg coumadin again tonight.   Disposition: 01-Home or Self Care      Discharge Orders    Future Appointments: Provider: Department: Dept Phone: Center:   09/19/2012 1:30 PM Rachael Fee [facility] [phone] None   09/19/2012 2:00 PM Josph Macho, MD [facility] [phone] None   10/01/2012 1:20 PM Baker Pierini, FNP [facility] [phone] [facility]       Medication List     As of 09/18/2012 10:39 AM    STOP taking these medications         oxyCODONE-acetaminophen 10-325 MG per tablet   Commonly known as: PERCOCET      TAKE these medications         acetaminophen 325 MG tablet   Commonly known as: TYLENOL   Take 2 tablets (650 mg total) by mouth  every 6 (six) hours as needed (or Temp > 100).      ALPRAZolam 1 MG tablet   Commonly known as: XANAX   Take 1 mg by mouth 3 (three) times daily as needed. Anxiety.      celecoxib 200 MG capsule   Commonly known as: CELEBREX   Take 200 mg by mouth daily as needed. Pain.      enoxaparin 150 MG/ML injection   Commonly known as: LOVENOX   Inject 1 mL (150 mg total) into the skin every 12 (twelve) hours.      folic acid 1 MG tablet   Commonly known as:  FOLVITE   Take 1 mg by mouth daily.      multivitamin with minerals tablet   Take 1 tablet by mouth daily.      omeprazole 40 MG capsule   Commonly known as: PRILOSEC   Take 40 mg by mouth daily.      omeprazole 40 MG capsule   Commonly known as: PRILOSEC   Take 40 mg by mouth daily.      oxyCODONE-acetaminophen 5-325 MG per tablet   Commonly known as: PERCOCET/ROXICET   Take 1-2 tablets by mouth every 4 (four) hours as needed.      polyethylene glycol packet   Commonly known as: MIRALAX / GLYCOLAX   Take 17 g by mouth daily as needed.      tadalafil 20 MG tablet   Commonly known as: CIALIS   Take 20 mg by mouth daily as needed. Before intercourse.      warfarin 5 MG tablet   Commonly known as: COUMADIN   Take by mouth daily. Take 12.5 mg on Mon, Wed, & Fri and all other days take 10 mg        Follow-up Information    Follow up with [facility], MD. Schedule an appointment as soon as possible for a visit in 10 days.   Contact information:   [address] [place] [address] [phone]       Schedule an appointment as soon as possible for a visit with FRY,STEPHEN A, MD. (You need to be rechecked in 2 days by DR. FRY or Dr. Myna Hidalgo.)    Contact information:   [address] [place] [address] [phone]       Follow up with Josph Macho, MD. (CALL and get your appointment rescheduled.  You will need to have your Protime rechecked 2 days after discharge and decide on the need for ongoing lovenox or if you are safe on your coumadin)    Contact information:   [address] [place] [address] [phone]          Signed: Sherrie George 09/18/2012, 10:39 AM

## 2012-09-18 LAB — CBC
HCT: 38.4 % — ABNORMAL LOW (ref 39.0–52.0)
Hemoglobin: 12.7 g/dL — ABNORMAL LOW (ref 13.0–17.0)
MCH: 30.2 pg (ref 26.0–34.0)
MCHC: 33.1 g/dL (ref 30.0–36.0)
MCV: 91.4 fL (ref 78.0–100.0)
Platelets: 291 10*3/uL (ref 150–400)
RBC: 4.2 MIL/uL — ABNORMAL LOW (ref 4.22–5.81)
RDW: 12.8 % (ref 11.5–15.5)
WBC: 10.3 10*3/uL (ref 4.0–10.5)

## 2012-09-18 LAB — PROTIME-INR
INR: 1.68 — ABNORMAL HIGH (ref 0.00–1.49)
Prothrombin Time: 19.2 seconds — ABNORMAL HIGH (ref 11.6–15.2)

## 2012-09-18 MED ORDER — ENOXAPARIN SODIUM 150 MG/ML ~~LOC~~ SOLN: 150.0000 mg | Freq: Two times a day (BID) | SUBCUTANEOUS | Status: DC

## 2012-09-18 MED ORDER — OXYCODONE-ACETAMINOPHEN 5-325 MG PO TABS: 1.0000 | ORAL_TABLET | ORAL | Status: DC | PRN

## 2012-09-18 MED ORDER — POLYETHYLENE GLYCOL 3350 17 G PO PACK: 17.0000 g | PACK | Freq: Every day | ORAL | Status: DC | PRN

## 2012-09-18 MED ORDER — INFLUENZA VIRUS VACC SPLIT PF IM SUSP
0.5000 mL | INTRAMUSCULAR | Status: DC | PRN
  Filled 2012-09-18: qty 0.5

## 2012-09-18 MED ORDER — ACETAMINOPHEN 325 MG PO TABS: 650.0000 mg | ORAL_TABLET | Freq: Four times a day (QID) | ORAL | Status: DC | PRN

## 2012-09-18 NOTE — Progress Notes (Signed)
ANTICOAGULATION CONSULT NOTE - Initial Consult  Pharmacy Consult for lovenox Indication: pulmonary embolus  No Known Allergies  Patient Measurements: Height: 5' 10.5" (179.1 cm) Weight:  (pt requested to do in the morning) IBW/kg (Calculated) : 74.15  Heparin Dosing Weight:   Vital Signs: Temp: 98.9 F (37.2 C) (09/30 2200) Temp src: Oral (09/30 2200) BP: 113/70 mmHg (09/30 2200) Pulse Rate: 87  (09/30 2200)  Labs:  Basename 09/18/12 0444 09/17/12 0445 09/16/12 0538  HGB 12.7* -- --  HCT 38.4* -- --  PLT 291 -- --  APTT -- -- --  LABPROT 19.2* 17.2* 17.2*  INR 1.68* 1.44 1.44  HEPARINUNFRC -- -- --  CREATININE -- -- --  CKTOTAL -- -- --  CKMB -- -- --  TROPONINI -- -- --    Estimated Creatinine Clearance: 145 ml/min (by C-G formula based on Cr of 0.85).   Medical History: Past Medical History  Diagnosis Date  . OSA (obstructive sleep apnea)     dr Shelle Iron  . Testosterone deficiency     dr Patsi Sears, shots every 2 weeks  . ED (erectile dysfunction)   . GERD (gastroesophageal reflux disease)     [facility]  . Low back pain     dr Channing Mutters, dr Farris Has, dr Ethelene Hal, herniated disc L4-5  . PE (pulmonary embolism)     bilateral sep 2011 and again bilateral May 2012  . 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 Primary hypercoagulable state     no etiology found per Dr. Shirline Frees   . Hx of colonoscopy   . Pulmonary embolism 02/22/2010    CT angio Dx    Medications:  Scheduled:     . cefOXitin  2 g Intravenous Q8H  . enoxaparin (LOVENOX) injection  150 mg Subcutaneous Q12H  . folic acid  1 mg Oral Daily  . lip balm  1 application Topical BID  . multivitamin with minerals  1 tablet Oral Daily  . pantoprazole  40 mg Oral Daily  . polyethylene glycol  17 g Oral Daily  . warfarin  15 mg Oral q1800  . Warfarin - Physician Dosing Inpatient   Does not apply q1800    Assessment: 67 YOM presents with appendicitis on warfarin for h/o PE, bridged perioperatively with UFH,  now orders to change to LMWH and resume warfarin. Pharmacy asked to dose enoxaparin. He is s/p appendectomy 9/26.  CBC OK, small dip in Hgb, no bleeding concerns known.   INR showing signs of trending toward therapeutic range  Goal of Therapy:  Anti-Xa level 0.6-1.2 units/ml 4hrs after LMWH dose given Monitor platelets by anticoagulation protocol: Yes INR=2-3   Plan:  -Continue lovenox 150mg  SQ q12h based on patient wt and est CrCl. -CBC q72h while here on full dose LMWH -Warfarin per [facility], dose increased to 15mg  PO daily (appropriate based on home dose). Suspect INR to be therapeutic in 1-2 days, IF goes home, would send on 15mg  for 2 days then resume prior to admission dose.  - Daily INR ordered  Dannielle Huh 09/18/2012,9:44 AM

## 2012-09-18 NOTE — Progress Notes (Signed)
Home.  Will follow up in 2 days with primary care for coumadin

## 2012-09-18 NOTE — Progress Notes (Signed)
Home today

## 2012-09-18 NOTE — Progress Notes (Signed)
5 Days Post-Op  Subjective: Complaining of soreness and gas.  Had a BM yesterday and eating well.  Drain was removed.  Objective: Vital signs in last 24 hours: Temp:  [98.7 F (37.1 C)-98.9 F (37.2 C)] 98.9 F (37.2 C) (09/30 2200) Pulse Rate:  [86-87] 87  (09/30 2200) Resp:  [18] 18  (09/30 2200) BP: (113-120)/(70-73) 113/70 mmHg (09/30 2200) SpO2:  [91 %-93 %] 91 % (09/30 2200) Last BM Date: 09/17/12 Diet; Heart healthy, drain: 10 ml, TM99.3, WBC 10.3, INR 1.68.  Intake/Output from previous day: 09/30 0701 - 10/01 0700 In: 960 [P.O.:960] Out: 10 [Drains:10] Intake/Output this shift:    General appearance: alert, cooperative and no distress GI: soft, non-tender; bowel sounds normal; no masses,  no organomegaly  Lab Results:   [facility] 09/18/12 0444  WBC 10.3  HGB 12.7*  HCT 38.4*  PLT 291    BMET No results found for this basename: NA:2,K:2,CL:2,CO2:2,GLUCOSE:2,BUN:2,CREATININE:2,CALCIUM:2 in the last 72 hours PT/INR  Basename 09/18/12 0444 09/17/12 0445  LABPROT 19.2* 17.2*  INR 1.68* 1.44    No results found for this basename: AST:5,ALT:5,ALKPHOS:5,BILITOT:5,PROT:5,ALBUMIN:5 in the last 168 hours   Lipase  No results found for this basename: lipase     Studies/Results: Dg Chest 1 View  09/16/2012  *RADIOLOGY REPORT*  Clinical Data: Shortness of breath and recent appendectomy. History of pulmonary embolism 2011.  CHEST - 1 VIEW  Comparison: CT of 11/15/2011 plain film 11/15/2011.  Findings: Mildly degraded exam due to AP portable technique and patient body habitus.  Cardiomegaly accentuated by AP portable technique.  Mild right hemidiaphragm elevation.  No left and no definite right pleural effusion. No pneumothorax.  Low lung volumes with resultant pulmonary interstitial prominence.  Mild volume loss at the lung bases.  IMPRESSION: Cardiomegaly and low lung volumes, without acute disease.   Original Report Authenticated By: Consuello Bossier, M.D.      Medications:    . cefOXitin  2 g Intravenous Q8H  . enoxaparin (LOVENOX) injection  150 mg Subcutaneous Q12H  . folic acid  1 mg Oral Daily  . lip balm  1 application Topical BID  . multivitamin with minerals  1 tablet Oral Daily  . pantoprazole  40 mg Oral Daily  . polyethylene glycol  17 g Oral Daily  . warfarin  15 mg Oral q1800  . Warfarin - Physician Dosing Inpatient   Does not apply q1800    Assessment/Plan Appendicitis on CT, s/p lap appy 09/13/12.  Hx of PE, fully anticoagulated/hx of hypercoagulable state  Obesity BMI 47.5  Sleep apnea  GERD  Hx of anxiety   Plan:  Home today on Lovenox Bridge, he has an appointment tomorrow with primary care to follow up on this.  We will see him back in the office in 2 weeks.   LOS: 7 days    Gregory Cortez 09/18/2012

## 2012-09-19 ENCOUNTER — Other Ambulatory Visit (HOSPITAL_BASED_OUTPATIENT_CLINIC_OR_DEPARTMENT_OTHER): Payer: BC Managed Care – PPO | Admitting: Lab

## 2012-09-19 ENCOUNTER — Ambulatory Visit (HOSPITAL_BASED_OUTPATIENT_CLINIC_OR_DEPARTMENT_OTHER): Payer: BC Managed Care – PPO | Admitting: Hematology & Oncology

## 2012-09-19 VITALS — BP 138/69 | HR 93 | Temp 99.9°F | Resp 22 | Ht 70.0 in | Wt 335.0 lb

## 2012-09-19 DIAGNOSIS — Z7901 Long term (current) use of anticoagulants: Secondary | ICD-10-CM

## 2012-09-19 DIAGNOSIS — I82409 Acute embolism and thrombosis of unspecified deep veins of unspecified lower extremity: Secondary | ICD-10-CM

## 2012-09-19 DIAGNOSIS — I2699 Other pulmonary embolism without acute cor pulmonale: Secondary | ICD-10-CM

## 2012-09-19 LAB — CBC WITH DIFFERENTIAL (CANCER CENTER ONLY)
BASO#: 0 10*3/uL (ref 0.0–0.2)
BASO%: 0.3 % (ref 0.0–2.0)
EOS%: 2.3 % (ref 0.0–7.0)
Eosinophils Absolute: 0.3 10*3/uL (ref 0.0–0.5)
HCT: 39.5 % (ref 38.7–49.9)
HGB: 13.3 g/dL (ref 13.0–17.1)
LYMPH#: 1 10*3/uL (ref 0.9–3.3)
LYMPH%: 8.2 % — ABNORMAL LOW (ref 14.0–48.0)
MCH: 30.8 pg (ref 28.0–33.4)
MCHC: 33.7 g/dL (ref 32.0–35.9)
MCV: 91 fL (ref 82–98)
MONO#: 1.2 10*3/uL — ABNORMAL HIGH (ref 0.1–0.9)
MONO%: 10.4 % (ref 0.0–13.0)
NEUT#: 9.3 10*3/uL — ABNORMAL HIGH (ref 1.5–6.5)
NEUT%: 78.8 % (ref 40.0–80.0)
Platelets: 298 10*3/uL (ref 145–400)
RBC: 4.32 10*6/uL (ref 4.20–5.70)
RDW: 12.7 % (ref 11.1–15.7)
WBC: 11.8 10*3/uL — ABNORMAL HIGH (ref 4.0–10.0)

## 2012-09-19 LAB — D-DIMER, QUANTITATIVE (NOT AT ARMC): D-Dimer, Quant: 4.74 ug/mL-FEU — ABNORMAL HIGH (ref 0.00–0.48)

## 2012-09-19 LAB — PROTIME-INR (CHCC SATELLITE)
INR: 2.1 (ref 2.0–3.5)
Protime: 25.2 Seconds — ABNORMAL HIGH (ref 10.6–13.4)

## 2012-09-19 NOTE — Discharge Summary (Signed)
Sherrie George, PA Physician Assistant Signed Surgery Progress Notes 09/17/2012 11:10 AM   Physician Discharge Summary   Patient ID:  Gregory Cortez  MRN: [id number]  DOB/AGE: 1956-10-08 56 y.o.  Admit date: 09/11/2012  Discharge date: 09/18/2012  Admission Diagnoses: Appendicitis on CT  Discharge Diagnoses: Appendicitis with Sub acute phlegmonous appendicitis without mention of peritonitis  Hx of PE, fully anticoagulated/hx of hypercoagulable state  Obesity BMI 47.5  Sleep apnea  GERD  Hx of anxiety  Principal Problem:  *Appendicitis with peritonitis on CT  Active Problems:  Anxiety state, unspecified  SLEEP APNEA, OBSTRUCTIVE  GERD  ERECTILE DYSFUNCTION, ORGANIC  Chronic anticoagulation for h/o Pulmonary embolism  Obesity, Class III, BMI 40-49.9 (morbid obesity)   PROCEDURES: Laparoscopic Appendectomy 09/13/12, [facility]  Hospital Course: Morbidly obese male on chronic anticoagulation for pulmonary embolism. Felto have abdominal pain for the past week. More right-sided. Has become more intense. Somewhat decreased appetite. Mild nausea. No emesis. No prior history of any abdominal surgery. No personal nor family history of GI/colon cancer, inflammatory bowel disease, irritable bowel syndrome, allergy such as Celiac Sprue, dietary/dairy problems, colitis, ulcers nor gastritis. No recent sick contacts/gastroenteritis. No travel outside the country. No changes in diet.  Based on concerns of persistent abdominal pain, he went to his primary care physician. Dr. Clent Ridges was concerned about appendicitis. CT scan was ordered & done this afternoon. He was recommend he go to the emergency room. He was in the waiting area for several hours. I was called and saw the patient within 20 minutes. Wife in room  He was admitted by Dr. Michaell Cowing after reviewing CT, showing uncomplicated Appendicitis. INR was 2.11, and 2.14 the following AM. His coumadin was reversed with FFP and Vitamin K. He was covered with  Lovenox which was later converted to IV heparin for surgery the following day.  His appendectomy was very difficult, and a drain was placed.  He was restarted on heparin that evening without a bolus, then he was restarted on Lovenox and coumadin. He has had some pain and post op constipation. He has been slow to come up to therapeutic range on his coumadin, and we have been giving him dose of 15 mg po daily. He is getting miralax today for constipation. If he does well and feels better we will plan to send him home on Lovenox bridge and home coumadin dose. We do not plan to send home on antibiotics. He has had a BM drain was removed prior to discharge.  He is on Lovenox 150 mg BID and we will send him with 3 days worth. He is to get his coumadin checked in 48 hours by his primary or DR. Ennever. We are going to tell him to take 15 mg coumadin again tonight.  Disposition: 01-Home or Self Care          Discharge Orders     Future Appointments:  Provider:  Department:  Dept Phone:  Center:     09/19/2012 1:30 PM  Rachael Fee  [facility]  [phone]  None     09/19/2012 2:00 PM  Josph Macho, MD  [facility]  [phone]  None     10/01/2012 1:20 PM  Baker Pierini, FNP  [facility]  [phone]  [facility]             Medication List        As of 09/18/2012 10:39 AM       STOP taking these medications  oxyCODONE-acetaminophen 10-325 MG per tablet      Commonly known as: PERCOCET       TAKE these medications          acetaminophen 325 MG tablet      Commonly known as: TYLENOL      Take 2 tablets (650 mg total) by mouth every 6 (six) hours as needed (or Temp > 100).      ALPRAZolam 1 MG tablet      Commonly known as: XANAX      Take 1 mg by mouth 3 (three) times daily as needed. Anxiety.      celecoxib 200 MG capsule      Commonly known as: CELEBREX      Take 200 mg by mouth daily as needed. Pain.      enoxaparin 150 MG/ML injection      Commonly  known as: LOVENOX      Inject 1 mL (150 mg total) into the skin every 12 (twelve) hours.      folic acid 1 MG tablet      Commonly known as: FOLVITE      Take 1 mg by mouth daily.      multivitamin with minerals tablet      Take 1 tablet by mouth daily.      omeprazole 40 MG capsule      Commonly known as: PRILOSEC      Take 40 mg by mouth daily.      omeprazole 40 MG capsule      Commonly known as: PRILOSEC      Take 40 mg by mouth daily.      oxyCODONE-acetaminophen 5-325 MG per tablet      Commonly known as: PERCOCET/ROXICET      Take 1-2 tablets by mouth every 4 (four) hours as needed.      polyethylene glycol packet      Commonly known as: MIRALAX / GLYCOLAX      Take 17 g by mouth daily as needed.      tadalafil 20 MG tablet      Commonly known as: CIALIS      Take 20 mg by mouth daily as needed. Before intercourse.      warfarin 5 MG tablet      Commonly known as: COUMADIN      Take by mouth daily. Take 12.5 mg on Mon, Wed, & Fri and all other days take 10 mg          Follow-up Information     Follow up with [facility], MD. Schedule an appointment as soon as possible for a visit in 10 days.     Contact information:     [address]  [address]  [address]  [place] [address]  [phone]         Schedule an appointment as soon as possible for a visit with FRY,STEPHEN A, MD. (You need to be rechecked in 2 days by DR. FRY or Dr. Myna Hidalgo.)     Contact information:     [address] [place] [address]  [phone]         Follow up with Josph Macho, MD. (CALL and get your appointment rescheduled. You will need to have your Protime rechecked 2 days after discharge and decide on the need for ongoing lovenox or if you are safe on your coumadin)     Contact information:     [address], [address]  [place] [address]  [phone]            Signed:  Sherrie George  09/18/2012, 10:39 AM    Cosigned by: Clovis Pu. Cornett, MD [09/18/2012 11:05  AM]

## 2012-09-19 NOTE — Discharge Summary (Signed)
AGREE

## 2012-09-20 ENCOUNTER — Telehealth: Payer: Self-pay | Admitting: Oncology

## 2012-09-20 NOTE — Telephone Encounter (Signed)
Alert result:  D-Dimer 4.74. Dr. Myna Hidalgo notified. Teola Bradley, Elease Sw[employer]

## 2012-09-20 NOTE — Patient Instructions (Addendum)
call if bleeding

## 2012-09-20 NOTE — Progress Notes (Signed)
This office note has been dictated.

## 2012-09-21 ENCOUNTER — Encounter (HOSPITAL_COMMUNITY): Payer: Self-pay | Admitting: Emergency Medicine

## 2012-09-21 ENCOUNTER — Inpatient Hospital Stay (HOSPITAL_COMMUNITY)
Admission: EM | Admit: 2012-09-21 | Discharge: 2012-10-01 | DRG: 580 | Disposition: A | Discharge status: Home / Self Care | Payer: BC Managed Care – PPO | Attending: General Surgery | Admitting: General Surgery

## 2012-09-21 ENCOUNTER — Other Ambulatory Visit (INDEPENDENT_AMBULATORY_CARE_PROVIDER_SITE_OTHER): Payer: Self-pay | Admitting: Surgery

## 2012-09-21 ENCOUNTER — Ambulatory Visit (INDEPENDENT_AMBULATORY_CARE_PROVIDER_SITE_OTHER): Payer: BC Managed Care – PPO | Admitting: Surgery

## 2012-09-21 ENCOUNTER — Encounter (INDEPENDENT_AMBULATORY_CARE_PROVIDER_SITE_OTHER): Payer: Self-pay | Admitting: Surgery

## 2012-09-21 VITALS — BP 118/80 | HR 90 | Temp 97.8°F | Resp 18 | Ht 70.0 in | Wt 336.4 lb

## 2012-09-21 DIAGNOSIS — L02219 Cutaneous abscess of trunk, unspecified: Secondary | ICD-10-CM | POA: Diagnosis not present

## 2012-09-21 DIAGNOSIS — G4733 Obstructive sleep apnea (adult) (pediatric): Secondary | ICD-10-CM | POA: Diagnosis present

## 2012-09-21 DIAGNOSIS — K6819 Other retroperitoneal abscess: Secondary | ICD-10-CM | POA: Diagnosis present

## 2012-09-21 DIAGNOSIS — F411 Generalized anxiety disorder: Secondary | ICD-10-CM | POA: Diagnosis present

## 2012-09-21 DIAGNOSIS — G4730 Sleep apnea, unspecified: Secondary | ICD-10-CM | POA: Diagnosis present

## 2012-09-21 DIAGNOSIS — A498 Other bacterial infections of unspecified site: Secondary | ICD-10-CM | POA: Diagnosis present

## 2012-09-21 DIAGNOSIS — D6859 Other primary thrombophilia: Secondary | ICD-10-CM | POA: Diagnosis present

## 2012-09-21 DIAGNOSIS — Y92009 Unspecified place in unspecified non-institutional (private) residence as the place of occurrence of the external cause: Secondary | ICD-10-CM

## 2012-09-21 DIAGNOSIS — Z6841 Body Mass Index (BMI) 40.0 and over, adult: Secondary | ICD-10-CM

## 2012-09-21 DIAGNOSIS — Z7901 Long term (current) use of anticoagulants: Secondary | ICD-10-CM

## 2012-09-21 DIAGNOSIS — R1901 Right upper quadrant abdominal swelling, mass and lump: Secondary | ICD-10-CM

## 2012-09-21 DIAGNOSIS — K219 Gastro-esophageal reflux disease without esophagitis: Secondary | ICD-10-CM | POA: Diagnosis present

## 2012-09-21 DIAGNOSIS — N5089 Other specified disorders of the male genital organs: Secondary | ICD-10-CM | POA: Diagnosis not present

## 2012-09-21 DIAGNOSIS — M5126 Other intervertebral disc displacement, lumbar region: Secondary | ICD-10-CM | POA: Diagnosis present

## 2012-09-21 DIAGNOSIS — K3533 Acute appendicitis with perforation and localized peritonitis, with abscess: Secondary | ICD-10-CM | POA: Diagnosis present

## 2012-09-21 DIAGNOSIS — T8140XA Infection following a procedure, unspecified, initial encounter: Principal | ICD-10-CM | POA: Diagnosis present

## 2012-09-21 DIAGNOSIS — Y836 Removal of other organ (partial) (total) as the cause of abnormal reaction of the patient, or of later complication, without mention of misadventure at the time of the procedure: Secondary | ICD-10-CM | POA: Diagnosis present

## 2012-09-21 DIAGNOSIS — Z86711 Personal history of pulmonary embolism: Secondary | ICD-10-CM

## 2012-09-21 DIAGNOSIS — R609 Edema, unspecified: Secondary | ICD-10-CM | POA: Diagnosis present

## 2012-09-21 DIAGNOSIS — R197 Diarrhea, unspecified: Secondary | ICD-10-CM | POA: Diagnosis not present

## 2012-09-21 DIAGNOSIS — Z9089 Acquired absence of other organs: Secondary | ICD-10-CM

## 2012-09-21 DIAGNOSIS — E291 Testicular hypofunction: Secondary | ICD-10-CM | POA: Diagnosis present

## 2012-09-21 DIAGNOSIS — N529 Male erectile dysfunction, unspecified: Secondary | ICD-10-CM | POA: Diagnosis present

## 2012-09-21 DIAGNOSIS — Z79899 Other long term (current) drug therapy: Secondary | ICD-10-CM

## 2012-09-21 LAB — CBC WITH DIFFERENTIAL/PLATELET
Basophils Absolute: 0 10*3/uL (ref 0.0–0.1)
Basophils Relative: 0 % (ref 0–1)
Eosinophils Absolute: 0.1 10*3/uL (ref 0.0–0.7)
Eosinophils Relative: 1 % (ref 0–5)
HCT: 38.5 % — ABNORMAL LOW (ref 39.0–52.0)
Hemoglobin: 12.9 g/dL — ABNORMAL LOW (ref 13.0–17.0)
Lymphocytes Relative: 8 % — ABNORMAL LOW (ref 12–46)
Lymphs Abs: 1.4 10*3/uL (ref 0.7–4.0)
MCH: 30.1 pg (ref 26.0–34.0)
MCHC: 33.5 g/dL (ref 30.0–36.0)
MCV: 90 fL (ref 78.0–100.0)
Monocytes Absolute: 1.6 10*3/uL — ABNORMAL HIGH (ref 0.1–1.0)
Monocytes Relative: 10 % (ref 3–12)
Neutro Abs: 13.8 10*3/uL — ABNORMAL HIGH (ref 1.7–7.7)
Neutrophils Relative %: 81 % — ABNORMAL HIGH (ref 43–77)
Platelets: 387 10*3/uL (ref 150–400)
RBC: 4.28 MIL/uL (ref 4.22–5.81)
RDW: 13 % (ref 11.5–15.5)
WBC: 16.9 10*3/uL — ABNORMAL HIGH (ref 4.0–10.5)

## 2012-09-21 MED ORDER — OXYCODONE HCL 5 MG PO TABS: 5.0000 mg | ORAL_TABLET | ORAL | Status: DC | PRN

## 2012-09-21 MED ORDER — ONDANSETRON HCL 4 MG/2ML IJ SOLN
4.0000 mg | Freq: Once | INTRAMUSCULAR | Status: AC
  Administered 2012-09-22: 4 mg via INTRAVENOUS
  Filled 2012-09-21: qty 2

## 2012-09-21 NOTE — Progress Notes (Signed)
CC:   Tera Mater. Clent Ridges, MD Caralyn Guile. Ethelene Hal, M.D. Verne Carrow, MD  DIAGNOSIS:  Idiopathic pulmonary embolism.  CURRENT THERAPY:  Lifelong Coumadin to maintain INR of about 2 (patient currently alternating between 12.5 and 10 mg p.o. daily).  INTERIM HISTORY:  Mr. Gregory Cortez comes in for a visit.  He was just discharged from [facility].  He was admitted over there because of appendicitis.  He had his  appendix taken out on the 26th. He had no problems with respect to bleeding.  He was restarted on anticoagulation.  I think he was put on Lovenox.  He now is back on Coumadin.  The pharmacists were managing his INR in the hospital.  When he was discharged, his INR was 1.68.  His hemoglobin was 10.2, hematocrit 38.4.  He has had no problems with respect to the pulmonary embolism.  He is exercising.  I see him at the gym.  He has lost about 40 some pounds.  He does continue on folic acid.  I just think this is a good adjunct to his Coumadin.  He is still trying to work.  He will be out for another 2-3 weeks since having surgery.  He had his surgery done laparoscopically.  He has had no cough.  He has had no nausea or vomiting.  There has been no headache.  His last CT angiogram was done back in November of 2012.  There was no evidence of obvious pulmonary embolism at that time.  PHYSICAL EXAMINATION:  This is an obese, white gentleman in no obvious distress.  Vital signs:  99.9, pulse 93, respiratory rate 18, blood pressure 138/69.  Weight is 335.  Head and neck:  Normocephalic, atraumatic skull.  There are no ocular or oral lesions.  There are no palpable cervical or supraclavicular lymph nodes.  Lungs:  Clear bilaterally.  He has no rales, wheezes or rhonchi.  Cardiac:  Regular rate and rhythm with normal S1 and S2.  There are no murmurs, rubs or bruits.  Abdomen:  Soft with good bowel sounds.  He has well-healed laparoscopy scars.  He is obese.  There is some  slight tenderness in the right side.  There is no hepatomegaly.  There is no splenomegaly. Extremities:  No clubbing, cyanosis or edema.  He has good range motion of his joints.  LABORATORY STUDIES:  White cell count of 11.8, hemoglobin 13.3, hematocrit 39.5, platelet count 298.  D-dimer is 4.74.  INR is 2.1.  IMPRESSION:  Mr. Schaller is a 56 year old gentleman.  He has recurrent pulmonary embolism.  There is no hypercoagulable abnormality that we found so far. His homocystine level is up just a little bit.  I think that folic acid would not be a bad idea for this.  His D-dimer, I believe, is elevated because of because of his recent surgery.  I do not see a need for any scans on Mr. Bribiesca right now.  I think we can probably get him through the holidays.  We will plan to get him back in January.  He will continue on his physical therapy.    ______________________________ Josph Macho, M.D. PRE/MEDQ  D:  09/20/2012  T:  09/21/2012  Job:  [id number]

## 2012-09-21 NOTE — ED Notes (Signed)
Pt alert, arrives from home, c/o ? Post op complications, recent appendectomy, seen in surgery center today, has f/u next week, presents with fever, abd pain, resp even unlabored, skin pwd

## 2012-09-21 NOTE — Progress Notes (Signed)
Subjective:     Patient ID: Gregory Cortez, male   DOB: [birth year], 56 y.o.   MRN: [id number]  HPI  Gregory Cortez Gregory Cortez  1956/08/10 [id number]  Patient Care Team: Nelwyn Salisbury, MD as PCP - General Kathleene Hazel, MD (Cardiology) Barbaraann Share, MD as Consulting Physician (Pulmonary Disease)  This patient is a 56 y.o.male who presents today for surgical evaluation at the request of Dr. Donell Beers.   Reason for visit: Painful lump on right abdomen.  Pleasant morbidly obese male.  He required lap-assisted appendectomy last month.  POD#8.  Eating well.  No nausea or vomiting.  Defecating fine.  Urinating okay.  Getting more active.  Just finished Lovenox shots.  Back on full oral warfarin anticoagulation for his recurrent pulmonary emboli.  He does note a lump on his right side.  It has been there for a while.  Rather painful.  Uncomfortable to take deep breaths at times.  No drainage.  Has not had any Lovenox shots there.  No history of fall or trauma to that area.  He was concerned.  He requested evaluation today.  Patient Active Problem List  Diagnosis  . HYPOGONADISM  . Anxiety state, unspecified  . SLEEP APNEA, OBSTRUCTIVE  . ACUTE BRONCHITIS  . GERD  . ERECTILE DYSFUNCTION, ORGANIC  . LOW BACK PAIN  . EDEMA LEG  . Shortness of breath  . HICCUPS  . Chronic anticoagulation for h/o Pulmonary embolism  . Primary hypercoagulable state  . Obesity, Class III, BMI 40-49.9 (morbid obesity)  . Appendicitis with peritonitis on CT  . Abdominal wall mass of right upper quadrant, probable hematoma    Past Medical History  Diagnosis Date  . OSA (obstructive sleep apnea)     dr Shelle Iron  . Testosterone deficiency     dr Patsi Sears, shots every 2 weeks  . ED (erectile dysfunction)   . GERD (gastroesophageal reflux disease)     [facility]  . Low back pain     dr Channing Mutters, dr Farris Has, dr Ethelene Hal, herniated disc L4-5  . PE (pulmonary embolism)     bilateral sep 2011 and again bilateral May  2012  . 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 Primary hypercoagulable state     no etiology found per Dr. Shirline Frees   . Hx of colonoscopy   . Pulmonary embolism 02/22/2010    CT angio Dx    Past Surgical History  Procedure Date  . Knee surgery     both  . Shoulder surgery     right and left  . Esi     L4-5 dr Channing Mutters 03/2010  . Rotator cuff repair 08-20-10    left dr Wyline Mood complicated by PE  . Esophagogastroduodenoscopy     x2 - normal except reflux  . Laparoscopic appendectomy 09/13/2012    Procedure: APPENDECTOMY LAPAROSCOPIC;  Surgeon: Almond Lint, MD;  Location: [facility];  Service: General;  Laterality: N/A;    History   Social History  . Marital Status: Married    Spouse Name: N/A    Number of Children: 3  . Years of Education: N/A   Occupational History  . Desk job-petroleum dispatcher    Social History Main Topics  . Smoking status: Never Smoker   . Smokeless tobacco: Never Used  . Alcohol Use: No  . Drug Use: No  . Sexually Active: Not on file   Other Topics Concern  . Not on file   Social  History Narrative  . No narrative on file    Family History  Problem Relation Age of Onset  . Hypertension Other   . Cancer Other     lung  . Coronary artery disease Neg Hx     Current Outpatient Prescriptions  Medication Sig Dispense Refill  . acetaminophen (TYLENOL) 325 MG tablet Take 2 tablets (650 mg total) by mouth every 6 (six) hours as needed (or Temp > 100).      . ALPRAZolam (XANAX) 1 MG tablet Take 1 mg by mouth 3 (three) times daily as needed. Anxiety.      . celecoxib (CELEBREX) 200 MG capsule Take 200 mg by mouth daily as needed. Pain.      . enoxaparin (LOVENOX) 150 MG/ML injection Inject 1 mL (150 mg total) into the skin every 12 (twelve) hours.  8 Syringe  0  . folic acid (FOLVITE) 1 MG tablet Take 1 mg by mouth daily.      . furosemide (LASIX) 40 MG tablet 40 mg as needed.       . Multiple Vitamin (MULTI-VITAMIN DAILY PO) Take by mouth every  morning.      . Multiple Vitamins-Minerals (MULTIVITAMIN WITH MINERALS) tablet Take 1 tablet by mouth daily.      [name] Kitchen omeprazole (PRILOSEC) 40 MG capsule Take 40 mg by mouth daily.      [name] Kitchen oxyCODONE-acetaminophen (PERCOCET/ROXICET) 5-325 MG per tablet Take 1-2 tablets by mouth every 4 (four) hours as needed.  40 tablet  0  . polyethylene glycol (MIRALAX / GLYCOLAX) packet Take 17 g by mouth daily as needed.  1 each    . tadalafil (CIALIS) 20 MG tablet Take 20 mg by mouth daily as needed. Before intercourse.      . warfarin (COUMADIN) 5 MG tablet Take by mouth daily. Take 12.5 mg on Mon, Wed, & Fri and all other days take 10 mg      . oxyCODONE (OXY IR/ROXICODONE) 5 MG immediate release tablet Take 1-2 tablets (5-10 mg total) by mouth every 4 (four) hours as needed for pain.  50 tablet  0     No Known Allergies  BP 118/80  Pulse 90  Temp 97.8 F (36.6 C) (Temporal)  Resp 18  Ht 5\' 10"  (1.778 m)  Wt 336 lb 6.4 oz (152.59 kg)  BMI 48.27 kg/m2  Dg Chest 1 View  09/16/2012  *RADIOLOGY REPORT*  Clinical Data: Shortness of breath and recent appendectomy. History of pulmonary embolism 2011.  CHEST - 1 VIEW  Comparison: CT of 11/15/2011 plain film 11/15/2011.  Findings: Mildly degraded exam due to AP portable technique and patient body habitus.  Cardiomegaly accentuated by AP portable technique.  Mild right hemidiaphragm elevation.  No left and no definite right pleural effusion. No pneumothorax.  Low lung volumes with resultant pulmonary interstitial prominence.  Mild volume loss at the lung bases.  IMPRESSION: Cardiomegaly and low lung volumes, without acute disease.   Original Report Authenticated By: Consuello Bossier, M.D.    Ct Abdomen Pelvis W Contrast  09/11/2012  *RADIOLOGY REPORT*  Clinical Data: Right lower quadrant and flank pain for 1 week. Evaluate for appendicitis.  CT ABDOMEN AND PELVIS WITH CONTRAST  Technique:  Multidetector CT imaging of the abdomen and pelvis was performed following the  standard protocol during bolus administration of intravenous contrast.  Contrast: OMNIPAQUE IOHEXOL 300 MG/ML  SOLN  Comparison: None.  Findings: Clear lung bases.  Mild cardiomegaly.  A small to moderate hiatal hernia.  Normal liver, spleen, stomach, pancreas, gallbladder, biliary tract, adrenal glands, kidneys.  No retroperitoneal or retrocrural adenopathy.  Normal colon and terminal ileum.  The proximal appendix is inflamed, with an appendicolith within.  Example image 47.  The appendiceal tip is relatively uninvolved, and is air filled.  There is no perforation or surrounding abscess. There are prominent nodes in the ileocolic mesentery; likely reactive.  Normal small bowel without abdominal ascites.    No pelvic adenopathy.    Normal urinary bladder and prostate.  No significant free fluid.  No acute osseous abnormality.  IMPRESSION:  1.  Findings of uncomplicated moderate appendicitis. These results will be called to the ordering clinician or representative by the Radiologist Assistant, and communication documented in the PACS Dashboard. 2. Small to moderate hiatal hernia.   Original Report Authenticated By: Consuello Bossier, M.D.       Review of Systems  Constitutional: Negative for fever, chills and diaphoresis.  HENT: Negative for sore throat, trouble swallowing and neck pain.   Eyes: Negative for photophobia and visual disturbance.  Respiratory: Negative for choking and shortness of breath.   Cardiovascular: Negative for chest pain and palpitations.  Gastrointestinal: Positive for abdominal pain. Negative for nausea, vomiting, diarrhea, constipation, blood in stool, abdominal distention, anal bleeding and rectal pain.  Genitourinary: Negative for dysuria, urgency, difficulty urinating and testicular pain.  Musculoskeletal: Negative for myalgias, arthralgias and gait problem.  Skin: Positive for rash. Negative for color change.  Neurological: Negative for dizziness, speech difficulty,  weakness and numbness.  Hematological: Negative for adenopathy.  Psychiatric/Behavioral: Negative for hallucinations, confusion and agitation.       Objective:   Physical Exam  Constitutional: He is oriented to person, place, and time. He appears well-developed and well-nourished. No distress.  HENT:  Head: Normocephalic.  Mouth/Throat: Oropharynx is clear and moist. No oropharyngeal exudate.  Eyes: Conjunctivae normal and EOM are normal. Pupils are equal, round, and reactive to light. No scleral icterus.  Neck: Normal range of motion. No tracheal deviation present.  Cardiovascular: Normal rate, normal heart sounds and intact distal pulses.   Pulmonary/Chest: Effort normal. No respiratory distress.  Abdominal: Soft. He exhibits no distension. There is no tenderness. Hernia confirmed negative in the right inguinal area and confirmed negative in the left inguinal area.         Incisions clean with normal healing ridges.  No hernias  Musculoskeletal: Normal range of motion. He exhibits no tenderness.  Neurological: He is alert and oriented to person, place, and time. No cranial nerve deficit. He exhibits normal muscle tone. Coordination normal.  Skin: Skin is warm and dry. No rash noted. He is not diaphoretic.  Psychiatric: He has a normal mood and affect. His behavior is normal.       Assessment:     Abdominal wall mass in morbidly obese male.  Just over a week out from lap-assisted appendectomy.  Probable hematoma.      Plan:     Increase activity as tolerated.  Do not push through pain.  Tylenol around-the-clock.  Oxycodone for breakthrough pain.  Add heat.  CT scan of abdomen pelvis to rule out abscess or hernia or other abnormality.  Include chest CT given his chest discomfort and history of pulmonary emboli.  Advanced on diet as tolerated. Bowel regimen to avoid problems.  Return to clinic ~2 weeks if it is a hematoma.  Sooner if other concerns. Call after CT scan done.    The patient expressed understanding and appreciation.

## 2012-09-21 NOTE — Patient Instructions (Addendum)
Obtain CT scan to make sure this mass is not an abscess.  Call after it is done.  Managing Pain  Pain after surgery or related to activity is often due to strain/injury to muscle, tendon, nerves and/or incisions.  This pain is usually short-term and will improve in a few months.   Many people find it helpful to do the following things TOGETHER to help speed the process of healing and to get back to regular activity more quickly:  1. Avoid heavy physical activity a.  no lifting greater than 20 pounds b. Do not "push through" the pain.  Listen to your body and avoid positions and maneuvers than reproduce the pain c. Walking is okay as tolerated, but go slowly and stop when getting sore.  d. Remember: If it hurts to do it, then don't do it! 2. Take Anti-inflammatory medication  a. Take with food/snack around the clock for 1-2 weeks i. This helps the muscle and nerve tissues become less irritable and calm down faster b. Choose ONE of the following over-the-counter medications: i. Acetaminophen 500mg  tabs (Tylenol) 2 pills with every meal and just before bedtime 3. Use a Heating pad or Ice/Cold Pack a. 4-6 times a day b. May use warm bath/hottub  or showers 4. Try Gentle Massage and/or Stretching  a. at the area of pain many times a day b. stop if you feel pain - do not overdo it  Try these steps together to help you body heal faster and avoid making things get worse.  Doing just one of these things may not be enough.    If you are not getting better after two weeks or are noticing you are getting worse, contact our office for further advice; we may need to re-evaluate you & see what other things we can do to help.    Hematoma A hematoma is a pocket of blood that collects under the skin, in an organ, in a body space, in a joint space, or in other tissue. The blood can clot to form a lump that you can see and feel. The lump is often firm, sore, and sometimes even painful and tender. Most  hematomas get better in a few days to weeks. However, some hematomas may be serious and require medical care.Hematomas can range in size from very small to very large. CAUSES  A hematoma can be caused by a blunt or penetrating injury. It can also be caused by leakage from a blood vessel under the skin. Spontaneous leakage from a blood vessel is more likely to occur in elderly people, especially those taking blood thinners. Sometimes, a hematoma can develop after certain medical procedures. SYMPTOMS  Unlike a bruise, a hematoma forms a firm lump that you can feel. This lump is the collection of blood. The collection of blood can also cause your skin to turn a blue to dark blue color. If the hematoma is close to the surface of the skin, it often produces a yellowish color in the skin. DIAGNOSIS  Your caregiver can determine whether you have a hematoma based on your history and a physical exam. TREATMENT  Hematomas usually go away on their own over time. Rarely does the blood need to be drained out of the body. HOME CARE INSTRUCTIONS   Put ice on the injured area.  Put ice in a plastic bag.  Place a towel between your skin and the bag.  Leave the ice on for 15 to 20 minutes, 3 to 4 times  a day for the first 1 to 2 days.  After the first 2 days, switch to using warm compresses on the hematoma.  Elevate the injured area to help decrease pain and swelling. Wrapping the area with an elastic bandage may also be helpful. Compression helps to reduce swelling and promotes shrinking of the hematoma. Make sure the bandage is not wrapped too tight.  If your hematoma is on a lower extremity and is painful, crutches may be helpful for a couple days.  Only take over-the-counter or prescription medicines for pain, discomfort, or fever as directed by your caregiver. Most patients can take acetaminophen or ibuprofen for the pain. SEEK IMMEDIATE MEDICAL CARE IF:   You have increasing pain, or your pain is  not controlled with medicine.  You have a fever.  You have worsening swelling or discoloration.  Your skin over the hematoma breaks or starts bleeding. MAKE SURE YOU:   Understand these instructions.  Will watch your condition.  Will get help right away if you are not doing well or get worse. Document Released: 07/19/2004 Document Revised: 02/27/2012 Document Reviewed: 08/08/2011 [facility] Patient Information 2013 [place], [place].

## 2012-09-22 ENCOUNTER — Inpatient Hospital Stay (HOSPITAL_COMMUNITY): Payer: BC Managed Care – PPO

## 2012-09-22 ENCOUNTER — Emergency Department (HOSPITAL_COMMUNITY): Payer: BC Managed Care – PPO

## 2012-09-22 ENCOUNTER — Encounter (HOSPITAL_COMMUNITY): Payer: Self-pay | Admitting: Radiology

## 2012-09-22 LAB — COMPREHENSIVE METABOLIC PANEL
ALT: 24 U/L (ref 0–53)
AST: 20 U/L (ref 0–37)
Albumin: 2.3 g/dL — ABNORMAL LOW (ref 3.5–5.2)
Alkaline Phosphatase: 117 U/L (ref 39–117)
BUN: 8 mg/dL (ref 6–23)
CO2: 26 mEq/L (ref 19–32)
Calcium: 8.2 mg/dL — ABNORMAL LOW (ref 8.4–10.5)
Chloride: 96 mEq/L (ref 96–112)
Creatinine, Ser: 0.86 mg/dL (ref 0.50–1.35)
GFR calc Af Amer: >90 mL/min (ref >90)
GFR calc non Af Amer: >90 mL/min (ref >90)
Glucose, Bld: 126 mg/dL — ABNORMAL HIGH (ref 70–99)
Potassium: 3.6 mEq/L (ref 3.5–5.1)
Sodium: 131 mEq/L — ABNORMAL LOW (ref 135–145)
Total Bilirubin: 0.6 mg/dL (ref 0.3–1.2)
Total Protein: 6.5 g/dL (ref 6.0–8.3)

## 2012-09-22 LAB — URINALYSIS, MICROSCOPIC ONLY
Glucose, UA: NEGATIVE mg/dL
Hgb urine dipstick: NEGATIVE
Ketones, ur: NEGATIVE mg/dL
Nitrite: NEGATIVE
Protein, ur: 30 mg/dL — AB
Specific Gravity, Urine: 1.037 — ABNORMAL HIGH (ref 1.005–1.030)
Urobilinogen, UA: 2 mg/dL — ABNORMAL HIGH (ref 0.0–1.0)
pH: 6 (ref 5.0–8.0)

## 2012-09-22 LAB — PROTIME-INR
INR: 2.03 — ABNORMAL HIGH (ref 0.00–1.49)
INR: 2.16 — ABNORMAL HIGH (ref 0.00–1.49)
INR: 2.25 — ABNORMAL HIGH (ref 0.00–1.49)
Prothrombin Time: 22.1 seconds — ABNORMAL HIGH (ref 11.6–15.2)
Prothrombin Time: 23.2 seconds — ABNORMAL HIGH (ref 11.6–15.2)
Prothrombin Time: 23.9 seconds — ABNORMAL HIGH (ref 11.6–15.2)

## 2012-09-22 MED ORDER — DIPHENHYDRAMINE HCL 50 MG/ML IJ SOLN: 12.5000 mg | Freq: Four times a day (QID) | INTRAMUSCULAR | Status: DC | PRN

## 2012-09-22 MED ORDER — LACTATED RINGERS IV SOLN: INTRAVENOUS | Status: DC

## 2012-09-22 MED ORDER — BISACODYL 10 MG RE SUPP: 10.0000 mg | Freq: Two times a day (BID) | RECTAL | Status: DC | PRN

## 2012-09-22 MED ORDER — PROMETHAZINE HCL 25 MG/ML IJ SOLN: 12.5000 mg | Freq: Four times a day (QID) | INTRAMUSCULAR | Status: DC | PRN

## 2012-09-22 MED ORDER — PSYLLIUM 95 % PO PACK
1.0000 | PACK | Freq: Two times a day (BID) | ORAL | Status: DC
  Administered 2012-09-22 – 2012-10-01 (×13): 1 via ORAL
  Filled 2012-09-22 (×20): qty 1

## 2012-09-22 MED ORDER — ALPRAZOLAM 1 MG PO TABS: 1.0000 mg | ORAL_TABLET | Freq: Three times a day (TID) | ORAL | Status: DC | PRN

## 2012-09-22 MED ORDER — ENOXAPARIN SODIUM 30 MG/0.3ML ~~LOC~~ SOLN: 150.0000 mg | Freq: Two times a day (BID) | SUBCUTANEOUS | Status: DC

## 2012-09-22 MED ORDER — METOPROLOL TARTRATE 25 MG PO TABS
12.5000 mg | ORAL_TABLET | Freq: Two times a day (BID) | ORAL | Status: DC | PRN
  Filled 2012-09-22: qty 0.5

## 2012-09-22 MED ORDER — LACTATED RINGERS IV BOLUS (SEPSIS): 1000.0000 mL | Freq: Three times a day (TID) | INTRAVENOUS | Status: AC | PRN

## 2012-09-22 MED ORDER — FUROSEMIDE 40 MG PO TABS
40.0000 mg | ORAL_TABLET | Freq: Every day | ORAL | Status: DC | PRN
  Filled 2012-09-22: qty 1

## 2012-09-22 MED ORDER — TAB-A-VITE/IRON PO TABS
1.0000 | ORAL_TABLET | Freq: Every day | ORAL | Status: DC
  Administered 2012-09-22 – 2012-10-01 (×10): 1 via ORAL
  Filled 2012-09-22 (×10): qty 1

## 2012-09-22 MED ORDER — MAGIC MOUTHWASH
15.0000 mL | Freq: Four times a day (QID) | ORAL | Status: DC | PRN
  Filled 2012-09-22: qty 15

## 2012-09-22 MED ORDER — LIP MEDEX EX OINT
1.0000 "application " | TOPICAL_OINTMENT | Freq: Two times a day (BID) | CUTANEOUS | Status: DC
  Administered 2012-09-22 – 2012-10-01 (×10): 1 via TOPICAL

## 2012-09-22 MED ORDER — IOHEXOL 300 MG/ML  SOLN
100.0000 mL | Freq: Once | INTRAMUSCULAR | Status: AC | PRN
  Administered 2012-09-22: 100 mL via INTRAVENOUS

## 2012-09-22 MED ORDER — LEVALBUTEROL HCL 1.25 MG/0.5ML IN NEBU
1.2500 mg | INHALATION_SOLUTION | Freq: Four times a day (QID) | RESPIRATORY_TRACT | Status: DC | PRN
  Filled 2012-09-22: qty 0.5

## 2012-09-22 MED ORDER — ACETAMINOPHEN 500 MG PO TABS
1000.0000 mg | ORAL_TABLET | Freq: Three times a day (TID) | ORAL | Status: DC
  Administered 2012-09-22 – 2012-10-01 (×27): 1000 mg via ORAL
  Filled 2012-09-22: qty 1
  Filled 2012-09-22 (×16): qty 2
  Filled 2012-09-22: qty 1
  Filled 2012-09-22 (×10): qty 2

## 2012-09-22 MED ORDER — SODIUM CHLORIDE 0.9 % IV SOLN
1.0000 g | Freq: Once | INTRAVENOUS | Status: AC
  Administered 2012-09-22: 1 g via INTRAVENOUS
  Filled 2012-09-22: qty 1

## 2012-09-22 MED ORDER — HYDROMORPHONE HCL PF 1 MG/ML IJ SOLN: 0.5000 mg | INTRAMUSCULAR | Status: DC | PRN

## 2012-09-22 MED ORDER — FOLIC ACID 1 MG PO TABS
1.0000 mg | ORAL_TABLET | Freq: Every day | ORAL | Status: DC
  Administered 2012-09-22 – 2012-10-01 (×10): 1 mg via ORAL
  Filled 2012-09-22 (×10): qty 1

## 2012-09-22 MED ORDER — ACETAMINOPHEN 325 MG PO TABS: 650.0000 mg | ORAL_TABLET | Freq: Four times a day (QID) | ORAL | Status: DC

## 2012-09-22 MED ORDER — ONDANSETRON HCL 4 MG/2ML IJ SOLN
4.0000 mg | Freq: Four times a day (QID) | INTRAMUSCULAR | Status: DC | PRN
  Administered 2012-09-23 – 2012-09-28 (×4): 4 mg via INTRAVENOUS
  Filled 2012-09-22 (×4): qty 2

## 2012-09-22 MED ORDER — PIPERACILLIN-TAZOBACTAM 3.375 G IVPB
3.3750 g | Freq: Three times a day (TID) | INTRAVENOUS | Status: DC
  Administered 2012-09-22 – 2012-10-01 (×28): 3.375 g via INTRAVENOUS
  Filled 2012-09-22 (×32): qty 50

## 2012-09-22 MED ORDER — DEXTROSE IN LACTATED RINGERS 5 % IV SOLN
INTRAVENOUS | Status: DC
  Administered 2012-09-22 – 2012-09-25 (×7): via INTRAVENOUS
  Administered 2012-09-26: 100 mL via INTRAVENOUS
  Administered 2012-09-26 – 2012-09-27 (×2): via INTRAVENOUS
  Administered 2012-09-28: 1000 mL via INTRAVENOUS
  Administered 2012-09-28 – 2012-09-30 (×4): via INTRAVENOUS

## 2012-09-22 MED ORDER — ACETAMINOPHEN 325 MG PO TABS
650.0000 mg | ORAL_TABLET | Freq: Four times a day (QID) | ORAL | Status: DC | PRN
  Filled 2012-09-22: qty 2

## 2012-09-22 MED ORDER — ALUM & MAG HYDROXIDE-SIMETH 200-200-20 MG/5ML PO SUSP
30.0000 mL | Freq: Four times a day (QID) | ORAL | Status: DC | PRN
Start: 2012-09-22 — End: 2012-10-01

## 2012-09-22 MED ORDER — MAGNESIUM HYDROXIDE 400 MG/5ML PO SUSP
30.0000 mL | Freq: Two times a day (BID) | ORAL | Status: DC | PRN
  Administered 2012-09-22: 30 mL via ORAL
  Filled 2012-09-22: qty 30

## 2012-09-22 MED ORDER — PANTOPRAZOLE SODIUM 40 MG PO TBEC
80.0000 mg | DELAYED_RELEASE_TABLET | Freq: Every day | ORAL | Status: DC
  Administered 2012-09-22 – 2012-09-30 (×9): 80 mg via ORAL
  Filled 2012-09-22 (×11): qty 2

## 2012-09-22 MED ORDER — DIPHENHYDRAMINE HCL 12.5 MG/5ML PO ELIX
12.5000 mg | ORAL_SOLUTION | Freq: Four times a day (QID) | ORAL | Status: DC | PRN
  Administered 2012-09-22 – 2012-09-23 (×2): 25 mg via ORAL
  Administered 2012-09-24 (×2): 12.5 mg via ORAL
  Administered 2012-09-25 – 2012-09-28 (×2): 25 mg via ORAL
  Administered 2012-09-29 – 2012-09-30 (×2): 12.5 mg via ORAL
  Filled 2012-09-22 (×2): qty 5
  Filled 2012-09-22: qty 10
  Filled 2012-09-22: qty 5
  Filled 2012-09-22 (×3): qty 10
  Filled 2012-09-22: qty 5

## 2012-09-22 MED ORDER — OXYCODONE HCL 5 MG PO TABS
5.0000 mg | ORAL_TABLET | ORAL | Status: DC | PRN
  Administered 2012-09-24 – 2012-09-30 (×2): 10 mg via ORAL
  Administered 2012-09-30: 5 mg via ORAL
  Filled 2012-09-22 (×3): qty 2
  Filled 2012-09-22: qty 1

## 2012-09-22 MED ORDER — CELECOXIB 200 MG PO CAPS
200.0000 mg | ORAL_CAPSULE | Freq: Every day | ORAL | Status: DC | PRN
  Filled 2012-09-22: qty 1

## 2012-09-22 MED ORDER — ACETAMINOPHEN 650 MG RE SUPP
650.0000 mg | Freq: Four times a day (QID) | RECTAL | Status: DC | PRN
  Filled 2012-09-22: qty 1

## 2012-09-22 MED ORDER — HYDROMORPHONE HCL PF 1 MG/ML IJ SOLN
0.5000 mg | INTRAMUSCULAR | Status: DC | PRN
  Administered 2012-09-22 – 2012-09-25 (×14): 1 mg via INTRAVENOUS
  Administered 2012-09-25: 2 mg via INTRAVENOUS
  Administered 2012-09-26 – 2012-09-28 (×7): 1 mg via INTRAVENOUS
  Administered 2012-09-29: 0.5 mg via INTRAVENOUS
  Administered 2012-09-29: 1 mg via INTRAVENOUS
  Filled 2012-09-22 (×2): qty 1
  Filled 2012-09-22: qty 2
  Filled 2012-09-22 (×21): qty 1

## 2012-09-22 NOTE — Progress Notes (Addendum)
Dr. Gerrit Friends aware via phone pt running temp 102.7 then rechecked at 101.5. Wife at bedside and concerned that pt has had hiccups on and off since admit and now spiking temp. MD made aware of concerns as well as recent INR of 2.16. No new orders received at this time. Tylenol 1000 mg due at hs. Reassurance given to pt and wife.

## 2012-09-22 NOTE — Progress Notes (Signed)
Lab aware of recently ordered stat Protime-INR.

## 2012-09-22 NOTE — Progress Notes (Signed)
ANTIBIOTIC CONSULT NOTE - INITIAL  Pharmacy Consult for zosyn Indication: Appendicitis s/p lap appendectomy    No Known Allergies  Patient Measurements:   Adjusted Body Weight:   Vital Signs: Temp: 100.7 F (38.2 C) (10/05 0417) Temp src: Oral (10/05 0417) BP: 119/65 mmHg (10/05 0408) Pulse Rate: 106  (10/05 0416) Intake/Output from previous day:   Intake/Output from this shift:    Labs:  Basename 09/21/12 2345 09/19/12 1336  WBC 16.9* 11.8*  HGB 12.9* 13.3  PLT 387 298  LABCREA -- --  CREATININE 0.86 --   The CrCl is unknown because both a height and weight (above a minimum accepted value) are required for this calculation. No results found for this basename: VANCOTROUGH:2,VANCOPEAK:2,VANCORANDOM:2,GENTTROUGH:2,GENTPEAK:2,GENTRANDOM:2,TOBRATROUGH:2,TOBRAPEAK:2,TOBRARND:2,AMIKACINPEAK:2,AMIKACINTROU:2,AMIKACIN:2, in the last 72 hours   Microbiology: Recent Results (from the past 720 hour(s))  SURGICAL PCR SCREEN     Status: Abnormal   Collection Time   09/12/12  5:00 AM      Component Value Range Status Comment   MRSA, PCR NEGATIVE  NEGATIVE Final    Staphylococcus aureus POSITIVE (*) NEGATIVE Final     Medical History: Past Medical History  Diagnosis Date  . OSA (obstructive sleep apnea)     dr Shelle Iron  . Testosterone deficiency     dr Patsi Sears, shots every 2 weeks  . ED (erectile dysfunction)   . GERD (gastroesophageal reflux disease)     [facility]  . Low back pain     dr Channing Mutters, dr Farris Has, dr Ethelene Hal, herniated disc L4-5  . PE (pulmonary embolism)     bilateral sep 2011 and again bilateral May 2012  . 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 Primary hypercoagulable state     no etiology found per Dr. Shirline Frees   . Hx of colonoscopy   . Pulmonary embolism 02/22/2010    CT angio Dx    Medications:  Anti-infectives     Start     Dose/Rate Route Frequency Ordered Stop   09/22/12 0545   piperacillin-tazobactam (ZOSYN) IVPB 3.375 g        3.375 g 12.5  mL/hr over 240 Minutes Intravenous 3 times per day 09/22/12 0542     09/22/12 0430   ertapenem (INVANZ) 1 g in sodium chloride 0.9 % 50 mL IVPB        1 g 100 mL/hr over 30 Minutes Intravenous  Once 09/22/12 0416 09/22/12 0457         Assessment: Patient with order for zosyn per pharmacy.  First dose of antibiotics already sent to ED.  Noticed not given yet, so will resend to 5w.  Goal of Therapy:  Zosyn based on renal function   Plan:  Zosyn 3.375g IV Q8H infused over 4hrs.   Darlina Guys, Jacquenette Shone Crowford 09/22/2012,6:28 AM

## 2012-09-22 NOTE — H&P (Signed)
Probable drainage procedure tomorrow pending AM INR.

## 2012-09-22 NOTE — ED Notes (Signed)
Patient transported to CT 

## 2012-09-22 NOTE — Progress Notes (Addendum)
Krystle Oberman Marzo [id number] Jul 02, 1956   Subjective:  More comfortable now Family in room Wants to eat No nausea/vomiting  Objective:  Vital signs:  Filed Vitals:   09/22/12 0416 09/22/12 0417 09/22/12 0951 09/22/12 1000  BP:    129/81  Pulse: 106   98  Temp:  100.7 F (38.2 C)  98.4 F (36.9 C)  TempSrc:  Oral  Oral  Resp:    18  Height:   5\' 10"  (1.778 m)   Weight:   334 lb (151.501 kg)   SpO2: 93%   91%    Last BM Date: 09/20/12  Intake/Output   Yesterday:  10/04 0701 - 10/05 0700 In: 50 [IV Piggyback:50] Out: -  This shift:  Total I/O In: 0  Out: 400 [Urine:400]  Bowel function:  Flatus: y  BM: n  Physical Exam:  General: Pt awake/alert/oriented x4 in no acute distress Eyes: PERRL, normal EOM.  Sclera clear.  No icterus Neuro: CN II-XII intact w/o focal sensory/motor deficits. Lymph: No head/neck/groin lymphadenopathy Psych:  No delerium/psychosis/paranoia HENT: Normocephalic, Mucus membranes moist.  No thrush Neck: Supple, No tracheal deviation Chest: No chest wall pain w good excursion CV:  Pulses intact.  Regular rhythm Abdomen: Soft.  Nondistended.  Mod tender RUQ/flank mass.  No incarcerated hernias. Ext:  SCDs BLE.  No mjr edema.  No cyanosis Skin: No petechiae / purpurae.  Small RLQ abd wall skin blister at old tape site  Problem List:  Principal Problem:  *Abscess, retroperitoneal Active Problems:  Anxiety state, unspecified  SLEEP APNEA, OBSTRUCTIVE  GERD  Chronic anticoagulation for h/o Pulmonary embolism  Primary hypercoagulable state  Obesity, Class III, BMI 40-49.9 (morbid obesity)  Appendicitis with peritonitis s/p lap appy 09/13/2012   Assessment  Taryll Reichenberger Debruhl  56 y.o. male       Better  Plan:  -cont IV ABX -drain when INR <1.7 -CT chest r/o PE if still w Sx (hiccups, pain w deep breaths) after drain.  Too soon to check with IV contrast given last night & increased renal risk with repeated boluses.  Pt to be  re-anticoagulated soon.  Continue low dose peri-drainage procedure & follow closely -VTE prophylaxis- SCDs, etc -mobilize as tolerated to help recovery  I discussed the patient's status to the patient & family.  Questions were answered.  They expressed understanding & appreciation.   Ardeth Sportsman, M.D., F.A.C.S. Gastrointestinal and Minimally Invasive Surgery [facility] [address], [address] [place], [place] [address] [phone] Main / Paging [phone] Voice Mail   09/22/2012  CARE TEAM:  PCP: Nelwyn Salisbury, MD  Outpatient Care Team: Patient Care Team: Nelwyn Salisbury, MD as PCP - General Kathleene Hazel, MD (Cardiology) Barbaraann Share, MD as Consulting Physician (Pulmonary Disease)  Inpatient Treatment Team: Treatment Team: Attending Provider: Bishop Limbo, MD; Technician: [name], NT; Registered Nurse: [facility], RN; Consulting Physician: Bishop Limbo, MD; Registered Nurse: Bethann Goo, RN; Technician: Burnard Bunting, [place]; Registered Nurse: Rometta Emery, RN   Results:   Labs: Results for orders placed during the hospital encounter of 09/21/12 (from the past 48 hour(s))  CBC WITH DIFFERENTIAL     Status: Abnormal   Collection Time   09/21/12 11:45 PM      Component Value Range Comment   WBC 16.9 (*) 4.0 - 10.5 K/uL    RBC 4.28  4.22 - 5.81 MIL/uL    Hemoglobin 12.9 (*) 13.0 - 17.0 g/dL  HCT 38.5 (*) 39.0 - 52.0 %    MCV 90.0  78.0 - 100.0 fL    MCH 30.1  26.0 - 34.0 pg    MCHC 33.5  30.0 - 36.0 g/dL    RDW 95.2  84.1 - 32.4 %    Platelets 387  150 - 400 K/uL    Neutrophils Relative 81 (*) 43 - 77 %    Neutro Abs 13.8 (*) 1.7 - 7.7 K/uL    Lymphocytes Relative 8 (*) 12 - 46 %    Lymphs Abs 1.4  0.7 - 4.0 K/uL    Monocytes Relative 10  3 - 12 %    Monocytes Absolute 1.6 (*) 0.1 - 1.0 K/uL    Eosinophils Relative 1  0 - 5 %    Eosinophils Absolute 0.1  0.0 - 0.7 K/uL    Basophils Relative 0  0 - 1  %    Basophils Absolute 0.0  0.0 - 0.1 K/uL   COMPREHENSIVE METABOLIC PANEL     Status: Abnormal   Collection Time   09/21/12 11:45 PM      Component Value Range Comment   Sodium 131 (*) 135 - 145 mEq/L    Potassium 3.6  3.5 - 5.1 mEq/L    Chloride 96  96 - 112 mEq/L    CO2 26  19 - 32 mEq/L    Glucose, Bld 126 (*) 70 - 99 mg/dL    BUN 8  6 - 23 mg/dL    Creatinine, Ser 4.01  0.50 - 1.35 mg/dL    Calcium 8.2 (*) 8.4 - 10.5 mg/dL    Total Protein 6.5  6.0 - 8.3 g/dL    Albumin 2.3 (*) 3.5 - 5.2 g/dL    AST 20  0 - 37 U/L    ALT 24  0 - 53 U/L    Alkaline Phosphatase 117  39 - 117 U/L    Total Bilirubin 0.6  0.3 - 1.2 mg/dL    GFR calc non Af Amer >90  >90 mL/min    GFR calc Af Amer >90  >90 mL/min   PROTIME-INR     Status: Abnormal   Collection Time   09/21/12 11:45 PM      Component Value Range Comment   Prothrombin Time 23.9 (*) 11.6 - 15.2 seconds    INR 2.25 (*) 0.00 - 1.49   URINALYSIS, MICROSCOPIC ONLY     Status: Abnormal   Collection Time   09/22/12  3:38 AM      Component Value Range Comment   Color, Urine AMBER (*) YELLOW BIOCHEMICALS MAY BE AFFECTED BY COLOR   APPearance CLEAR  CLEAR    Specific Gravity, Urine 1.037 (*) 1.005 - 1.030    pH 6.0  5.0 - 8.0    Glucose, UA NEGATIVE  NEGATIVE mg/dL    Hgb urine dipstick NEGATIVE  NEGATIVE    Bilirubin Urine SMALL (*) NEGATIVE    Ketones, ur NEGATIVE  NEGATIVE mg/dL    Protein, ur 30 (*) NEGATIVE mg/dL    Urobilinogen, UA 2.0 (*) 0.0 - 1.0 mg/dL    Nitrite NEGATIVE  NEGATIVE    Leukocytes, UA TRACE (*) NEGATIVE    WBC, UA 0-2  <3 WBC/hpf   PROTIME-INR     Status: Abnormal   Collection Time   09/22/12  9:27 AM      Component Value Range Comment   Prothrombin Time 22.1 (*) 11.6 - 15.2 seconds    INR  2.03 (*) 0.00 - 1.49     Imaging / Studies: Ct Abdomen Pelvis W Contrast  09/22/2012  *RADIOLOGY REPORT*  Clinical Data: Pain and fever post appendectomy.  CT ABDOMEN AND PELVIS WITH CONTRAST  Technique:  Multidetector  CT imaging of the abdomen and pelvis was performed following the standard protocol during bolus administration of intravenous contrast.  Contrast: OMNIPAQUE IOHEXOL 300 MG/ML  SOLN  Comparison: 09/11/2012  Findings: There is a new small right pleural effusion.  There is dependent atelectasis or infiltrate in the visualized posterior right lung base.  There is a small hiatal hernia.  There is scattered abdominal ascites with the largest discrete component adjacent to the tip the liver, and smaller trace perisplenic and pelvic components as well as fluid in the left pericolic gutter, and among the leaves of the mesentery in the lower abdomen.  There are marked inflammatory/edematous changes in the right retroperitoneum in the region of the previous appendectomy, with a scattered fluid collections, largest discrete locule 2.8 x 6.7 cm. Staple line noted near the base of the cecum.  Multiple enlarged right lower quadrant mesenteric lymph nodes.  There is wall thickening in the proximal ascending colon. There are a few small loculated gas bubbles in the right anterolateral peritoneal cavity.  Unremarkable liver, gallbladder, spleen, adrenal glands, pancreas, kidneys, abdominal aorta.  The stomach and small bowel are non distended.  The colon is nondilated.  Urinary bladder is physiologically distended. No hydronephrosis or proximal ureterectasis.  Mild spondylitic changes in the lumbar spine.  IMPRESSION:  1.  Probable postop abscess in the right retroperitoneum, with significant adjacent inflammatory/edematous change. 2.  Scattered abdominal ascites without definite loculation. 3.  Small right pleural effusion. 4.  Hiatal hernia.   Original Report Authenticated By: Osa Craver, M.D.     Medications / Allergies: per chart  Antibiotics: Anti-infectives     Start     Dose/Rate Route Frequency Ordered Stop   09/22/12 0545  piperacillin-tazobactam (ZOSYN) IVPB 3.375 g       3.375 g 12.5 mL/hr over  240 Minutes Intravenous 3 times per day 09/22/12 0542     09/22/12 0430   ertapenem (INVANZ) 1 g in sodium chloride 0.9 % 50 mL IVPB        1 g 100 mL/hr over 30 Minutes Intravenous  Once 09/22/12 0416 09/22/12 0457

## 2012-09-22 NOTE — ED Provider Notes (Signed)
History     CSN: [id number]  Arrival date & time 09/21/12  2303   First MD Initiated Contact with Patient 09/22/12 0040      Chief Complaint  Patient presents with  . Post-op Problem    (Consider location/radiation/quality/duration/timing/severity/associated sxs/prior treatment) HPI This is a 56 year old male who underwent laparoscopic appendectomy about a week and a half ago. The appendectomy was complicated by retrocecal attachment. His hospital course also complicated by warfarin anticoagulation will which had to be reversed prior to the procedure. He was discharged 4 days ago. For the past 2 days he is developed worsening pain and induration of his abdomen. The pain is moderate. It is worse with movement or palpation. The induration, which he calls a "football in my abdomen" is on the right side of the abdomen. He developed fever she yesterday as high as 102.6. He has had chills. He denies nausea, vomiting or diarrhea but has had some constipation. He did move his bowels yesterday. He was seen in the office yesterday and a CT scan was scheduled for the day after tomorrow. The symptoms worsened so he came to the ED.  Past Medical History  Diagnosis Date  . OSA (obstructive sleep apnea)     dr Shelle Iron  . Testosterone deficiency     dr Patsi Sears, shots every 2 weeks  . ED (erectile dysfunction)   . GERD (gastroesophageal reflux disease)     [facility]  . Low back pain     dr Channing Mutters, dr Farris Has, dr Ethelene Hal, herniated disc L4-5  . PE (pulmonary embolism)     bilateral sep 2011 and again bilateral May 2012  . 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 Primary hypercoagulable state     no etiology found per Dr. Shirline Frees   . Hx of colonoscopy   . Pulmonary embolism 02/22/2010    CT angio Dx    Past Surgical History  Procedure Date  . Knee surgery     both  . Shoulder surgery     right and left  . Esi     L4-5 dr Channing Mutters 03/2010  . Rotator cuff repair 08-20-10    left dr Wyline Mood complicated  by PE  . Esophagogastroduodenoscopy     x2 - normal except reflux  . Laparoscopic appendectomy 09/13/2012    Procedure: APPENDECTOMY LAPAROSCOPIC;  Surgeon: Almond Lint, MD;  Location: [facility];  Service: General;  Laterality: N/A;    Family History  Problem Relation Age of Onset  . Hypertension Other   . Cancer Other     lung  . Coronary artery disease Neg Hx     History  Substance Use Topics  . Smoking status: Never Smoker   . Smokeless tobacco: Never Used  . Alcohol Use: No      Review of Systems  All other systems reviewed and are negative.    Allergies  Review of patient's allergies indicates no known allergies.  Home Medications   Current Outpatient Rx  Name Route Sig Dispense Refill  . ALPRAZOLAM 1 MG PO TABS Oral Take 1 mg by mouth 3 (three) times daily as needed. Anxiety.    . CELECOXIB 200 MG PO CAPS Oral Take 200 mg by mouth daily as needed. Pain.    [name] Kitchen ENOXAPARIN SODIUM 150 MG/ML [place] SOLN Subcutaneous Inject 1 mL (150 mg total) into the skin every 12 (twelve) hours. 8 Syringe 0  . FOLIC ACID 1 MG PO TABS Oral Take 1  mg by mouth daily.    . MULTI-VITAMIN DAILY PO Oral Take by mouth every morning.    [name] Kitchen OMEPRAZOLE 40 MG PO CPDR Oral Take 40 mg by mouth daily.    . OXYCODONE-ACETAMINOPHEN 5-325 MG PO TABS Oral Take 1-2 tablets by mouth every 4 (four) hours as needed. 40 tablet 0    Do not take more than 10 tylenol containing tablet ...  . POLYETHYLENE GLYCOL 3350 PO PACK Oral Take 17 g by mouth daily as needed. 1 each   . TADALAFIL 20 MG PO TABS Oral Take 20 mg by mouth daily as needed. Before intercourse.    . WARFARIN SODIUM 5 MG PO TABS Oral Take 5-10 mg by mouth daily. Take 1 tablet along with 7.5 tablet on Monday,Wednesday, & Friday to make a total of 12.5mg . Take 2 tablets (10mg ) on all other days.    Barron Alvine SODIUM 7.5 MG PO TABS Oral Take 7.5 mg by mouth every Monday, Wednesday, and Friday. Take along with 5mg  tablet to make a total dose of 12.5mg     .  FUROSEMIDE 40 MG PO TABS Oral Take 40 mg by mouth daily as needed. For increased fluid      BP 148/70  Pulse 109  Temp 99 F (37.2 C) (Oral)  Resp 16  SpO2 99%  Physical Exam General: Well-developed, obese male in no acute distress; appearance consistent with age of record HENT: normocephalic, atraumatic Eyes: pupils equal round and reactive to light; extraocular muscles intact Neck: supple Heart: regular rate and rhythm Lungs: clear to auscultation bilaterally Abdomen: Soft but with induration or mass palpable on the right abdomen; mildly distended; moderate tenderness over the right side of the abdomen; erythema and warm across abdominal wall centered on laparoscopy incision; erythema and swelling of the skin of the right lower quadrant Extremities: No deformity; full range of motion; pulses normal; no edema Neurologic: Awake, alert and oriented; motor function intact in all extremities and symmetric; no facial droop Skin: Warm and dry Psychiatric: Normal mood and affect    ED Course  Procedures (including critical care time)     MDM   Nursing notes and vitals signs, including pulse oximetry, reviewed.  Summary of this visit's results, reviewed by myself:  Labs:  Results for orders placed during the hospital encounter of 09/21/12  CBC WITH DIFFERENTIAL      Component Value Range   WBC 16.9 (*) 4.0 - 10.5 K/uL   RBC 4.28  4.22 - 5.81 MIL/uL   Hemoglobin 12.9 (*) 13.0 - 17.0 g/dL   HCT 16.1 (*) 09.6 - 04.5 %   MCV 90.0  78.0 - 100.0 fL   MCH 30.1  26.0 - 34.0 pg   MCHC 33.5  30.0 - 36.0 g/dL   RDW 40.9  81.1 - 91.4 %   Platelets 387  150 - 400 K/uL   Neutrophils Relative 81 (*) 43 - 77 %   Neutro Abs 13.8 (*) 1.7 - 7.7 K/uL   Lymphocytes Relative 8 (*) 12 - 46 %   Lymphs Abs 1.4  0.7 - 4.0 K/uL   Monocytes Relative 10  3 - 12 %   Monocytes Absolute 1.6 (*) 0.1 - 1.0 K/uL   Eosinophils Relative 1  0 - 5 %   Eosinophils Absolute 0.1  0.0 - 0.7 K/uL   Basophils  Relative 0  0 - 1 %   Basophils Absolute 0.0  0.0 - 0.1 K/uL  COMPREHENSIVE METABOLIC PANEL  Component Value Range   Sodium 131 (*) 135 - 145 mEq/L   Potassium 3.6  3.5 - 5.1 mEq/L   Chloride 96  96 - 112 mEq/L   CO2 26  19 - 32 mEq/L   Glucose, Bld 126 (*) 70 - 99 mg/dL   BUN 8  6 - 23 mg/dL   Creatinine, Ser 1.47  0.50 - 1.35 mg/dL   Calcium 8.2 (*) 8.4 - 10.5 mg/dL   Total Protein 6.5  6.0 - 8.3 g/dL   Albumin 2.3 (*) 3.5 - 5.2 g/dL   AST 20  0 - 37 U/L   ALT 24  0 - 53 U/L   Alkaline Phosphatase 117  39 - 117 U/L   Total Bilirubin 0.6  0.3 - 1.2 mg/dL   GFR calc non Af Amer >90  >90 mL/min   GFR calc Af Amer >90  >90 mL/min  URINALYSIS, MICROSCOPIC ONLY      Component Value Range   Color, Urine AMBER (*) YELLOW   APPearance CLEAR  CLEAR   Specific Gravity, Urine 1.037 (*) 1.005 - 1.030   pH 6.0  5.0 - 8.0   Glucose, UA NEGATIVE  NEGATIVE mg/dL   Hgb urine dipstick NEGATIVE  NEGATIVE   Bilirubin Urine SMALL (*) NEGATIVE   Ketones, ur NEGATIVE  NEGATIVE mg/dL   Protein, ur 30 (*) NEGATIVE mg/dL   Urobilinogen, UA 2.0 (*) 0.0 - 1.0 mg/dL   Nitrite NEGATIVE  NEGATIVE   Leukocytes, UA TRACE (*) NEGATIVE   WBC, UA 0-2  <3 WBC/hpf  PROTIME-INR      Component Value Range   Prothrombin Time 23.9 (*) 11.6 - 15.2 seconds   INR 2.25 (*) 0.00 - 1.49    Imaging Studies: Ct Abdomen Pelvis W Contrast  09/22/2012  *RADIOLOGY REPORT*  Clinical Data: Pain and fever post appendectomy.  CT ABDOMEN AND PELVIS WITH CONTRAST  Technique:  Multidetector CT imaging of the abdomen and pelvis was performed following the standard protocol during bolus administration of intravenous contrast.  Contrast: OMNIPAQUE IOHEXOL 300 MG/ML  SOLN  Comparison: 09/11/2012  Findings: There is a new small right pleural effusion.  There is dependent atelectasis or infiltrate in the visualized posterior right lung base.  There is a small hiatal hernia.  There is scattered abdominal ascites with the largest  discrete component adjacent to the tip the liver, and smaller trace perisplenic and pelvic components as well as fluid in the left pericolic gutter, and among the leaves of the mesentery in the lower abdomen.  There are marked inflammatory/edematous changes in the right retroperitoneum in the region of the previous appendectomy, with a scattered fluid collections, largest discrete locule 2.8 x 6.7 cm. Staple line noted near the base of the cecum.  Multiple enlarged right lower quadrant mesenteric lymph nodes.  There is wall thickening in the proximal ascending colon. There are a few small loculated gas bubbles in the right anterolateral peritoneal cavity.  Unremarkable liver, gallbladder, spleen, adrenal glands, pancreas, kidneys, abdominal aorta.  The stomach and small bowel are non distended.  The colon is nondilated.  Urinary bladder is physiologically distended. No hydronephrosis or proximal ureterectasis.  Mild spondylitic changes in the lumbar spine.  IMPRESSION:  1.  Probable postop abscess in the right retroperitoneum, with significant adjacent inflammatory/edematous change. 2.  Scattered abdominal ascites without definite loculation. 3.  Small right pleural effusion. 4.  Hiatal hernia.   Original Report Authenticated By: Osa Craver, M.D.  5:04 AM Dr. Michaell Cowing consulted, will see patient in ED.          Hanley Seamen, MD 09/22/12 [phone]

## 2012-09-22 NOTE — ED Notes (Signed)
CT notified pt completed CM 

## 2012-09-22 NOTE — H&P (Signed)
Gregory Cortez  Dec 07, 1956 [id number]  CARE TEAM:  PCP: Nelwyn Salisbury, MD  Outpatient Care Team: Patient Care Team: Nelwyn Salisbury, MD as PCP - General Kathleene Hazel, MD (Cardiology) Barbaraann Share, MD as Consulting Physician (Pulmonary Disease)  Inpatient Treatment Team: Treatment Team: Attending Provider: Bishop Limbo, MD; Technician: [name], NT; Registered Nurse: [name], RN; Consulting Physician: Bishop Limbo, MD   This patient is a 56 y.o.male who presents today for surgical evaluation at the request of Dr. Read Drivers.   Reason for evaluation: Appendicitis s/p lap appendectomy with fever/pain  Morbidly obese male on chronic anticoagulation for pulmonary embolism.  Found to have complicated appendicitis last month.  Underwent laparoscopically-assisted appendectomy.  Hospitalized.  IV antibiotics.  Improved.  Went home.  Noticed a large lump on his right flank.  Increasing discomfort.  Was concerned.  He came to clinic yesterday.  I recommended a CAT scan for evaluation to rule out an abscess or probable hematoma.  This had to be delayed for a few days.  At that time, patient had no nausea or vomiting.  Appetite was good.  Energy level better.  Moving bowels well.  No fevers.  Last night, patient noticed temperature of 102.  Worsening pain and discomfort.  Came to emergency room.  Able to do CT scan then.  Found to have elevated white count.  Probable retroperitoneal abscess.  I was called for evaluation.   Patient Active Problem List  Diagnosis  . HYPOGONADISM  . Anxiety state, unspecified  . SLEEP APNEA, OBSTRUCTIVE  . GERD  . ERECTILE DYSFUNCTION, ORGANIC  . LOW BACK PAIN  . EDEMA LEG  . Chronic anticoagulation for h/o Pulmonary embolism  . Primary hypercoagulable state  . Obesity, Class III, BMI 40-49.9 (morbid obesity)  . Appendicitis with peritonitis s/p lap appy 09/13/2012  . Abscess, retroperitoneal    Past Medical History  Diagnosis Date  . OSA  (obstructive sleep apnea)     dr Shelle Iron  . Testosterone deficiency     dr Patsi Sears, shots every 2 weeks  . ED (erectile dysfunction)   . GERD (gastroesophageal reflux disease)     [facility]  . Low back pain     dr Channing Mutters, dr Farris Has, dr Ethelene Hal, herniated disc L4-5  . PE (pulmonary embolism)     bilateral sep 2011 and again bilateral May 2012  . 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 Primary hypercoagulable state     no etiology found per Dr. Shirline Frees   . Hx of colonoscopy   . Pulmonary embolism 02/22/2010    CT angio Dx    Past Surgical History  Procedure Date  . Knee surgery     both  . Shoulder surgery     right and left  . Esi     L4-5 dr Channing Mutters 03/2010  . Rotator cuff repair 08-20-10    left dr Wyline Mood complicated by PE  . Esophagogastroduodenoscopy     x2 - normal except reflux  . Laparoscopic appendectomy 09/13/2012    Procedure: APPENDECTOMY LAPAROSCOPIC;  Surgeon: Almond Lint, MD;  Location: [facility];  Service: General;  Laterality: N/A;    History   Social History  . Marital Status: Married    Spouse Name: N/A    Number of Children: 3  . Years of Education: N/A   Occupational History  . Desk job-petroleum dispatcher    Social History Main Topics  . Smoking status: Never Smoker   .  Smokeless tobacco: Never Used  . Alcohol Use: No  . Drug Use: No  . Sexually Active: Not on file   Other Topics Concern  . Not on file   Social History Narrative  . No narrative on file    Family History  Problem Relation Age of Onset  . Hypertension Other   . Cancer Other     lung  . Coronary artery disease Neg Hx     Current Facility-Administered Medications  Medication Dose Route Frequency Provider Last Rate Last Dose  . acetaminophen (TYLENOL) tablet 650 mg  650 mg Oral Q6H PRN Ardeth Sportsman, MD       Or  . acetaminophen (TYLENOL) suppository 650 mg  650 mg Rectal Q6H PRN Ardeth Sportsman, MD      . acetaminophen (TYLENOL) tablet 650 mg  650 mg Oral QID Ardeth Sportsman, MD      . ALPRAZolam Prudy Feeler) tablet 1 mg  1 mg Oral TID PRN Ardeth Sportsman, MD      . alum & mag hydroxide-simeth (MAALOX/MYLANTA) 200-200-20 MG/5ML suspension 30 mL  30 mL Oral Q6H PRN Ardeth Sportsman, MD      . bisacodyl (DULCOLAX) suppository 10 mg  10 mg Rectal Q12H PRN Ardeth Sportsman, MD      . celecoxib (CELEBREX) capsule 200 mg  200 mg Oral Daily PRN Ardeth Sportsman, MD      . dextrose 5 % in lactated ringers infusion   Intravenous Continuous Ardeth Sportsman, MD      . diphenhydrAMINE (BENADRYL) injection 12.5-25 mg  12.5-25 mg Intravenous Q6H PRN Ardeth Sportsman, MD       Or  . diphenhydrAMINE (BENADRYL) 12.5 MG/5ML elixir 12.5-25 mg  12.5-25 mg Oral Q6H PRN Ardeth Sportsman, MD      . enoxaparin (LOVENOX) injection 150 mg  150 mg Subcutaneous Q12H Ardeth Sportsman, MD      . ertapenem [facility]) 1 g in sodium chloride 0.9 % 50 mL IVPB  1 g Intravenous Once Carlisle Beers Molpus, MD   1 g at 09/22/12 0427  . folic acid (FOLVITE) tablet 1 mg  1 mg Oral Daily Ardeth Sportsman, MD      . furosemide (LASIX) tablet 40 mg  40 mg Oral Daily PRN Ardeth Sportsman, MD      . HYDROmorphone (DILAUDID) injection 0.5-2 mg  0.5-2 mg Intravenous Q30 min PRN Ardeth Sportsman, MD      . iohexol (OMNIPAQUE) 300 MG/ML solution 100 mL  100 mL Intravenous Once PRN Ardeth Sportsman, MD   100 mL at 09/22/12 0329  . lactated ringers bolus 1,000 mL  1,000 mL Intravenous Q8H PRN Ardeth Sportsman, MD      . lactated ringers infusion   Intravenous Continuous Ardeth Sportsman, MD      . levalbuterol Pauline Aus) nebulizer solution 1.25 mg  1.25 mg Nebulization Q6H PRN Ardeth Sportsman, MD      . lip balm (CARMEX) ointment 1 application  1 application Topical BID Ardeth Sportsman, MD      . magic mouthwash  15 mL Oral QID PRN Ardeth Sportsman, MD      . magnesium hydroxide (MILK OF MAGNESIA) suspension 30 mL  30 mL Oral Q12H PRN Ardeth Sportsman, MD      . metoprolol tartrate (LOPRESSOR) tablet 12.5 mg  12.5 mg Oral Q12H PRN Ardeth Sportsman, MD      .  multivitamins with iron tablet 1 tablet  1 tablet Oral Daily Ardeth Sportsman, MD      . ondansetron [facility]) injection 4 mg  4 mg Intravenous Once Ardeth Sportsman, MD   4 mg at 09/22/12 0427  . ondansetron (ZOFRAN) injection 4 mg  4 mg Intravenous Q6H PRN Ardeth Sportsman, MD      . pantoprazole (PROTONIX) EC tablet 80 mg  80 mg Oral Q1200 Ardeth Sportsman, MD      . piperacillin-tazobactam (ZOSYN) IVPB 3.375 g  3.375 g Intravenous Q8H Ardeth Sportsman, MD      . promethazine (PHENERGAN) injection 12.5-25 mg  12.5-25 mg Intravenous Q6H PRN Ardeth Sportsman, MD      . psyllium (HYDROCIL/METAMUCIL) packet 1 packet  1 packet Oral BID Ardeth Sportsman, MD      . DISCONTD: HYDROmorphone (DILAUDID) injection 0.5-2 mg  0.5-2 mg Intravenous Q2H PRN Ardeth Sportsman, MD       Current Outpatient Prescriptions  Medication Sig Dispense Refill  . ALPRAZolam (XANAX) 1 MG tablet Take 1 mg by mouth 3 (three) times daily as needed. Anxiety.      . celecoxib (CELEBREX) 200 MG capsule Take 200 mg by mouth daily as needed. Pain.      . enoxaparin (LOVENOX) 150 MG/ML injection Inject 1 mL (150 mg total) into the skin every 12 (twelve) hours.  8 Syringe  0  . folic acid (FOLVITE) 1 MG tablet Take 1 mg by mouth daily.      . Multiple Vitamin (MULTI-VITAMIN DAILY PO) Take by mouth every morning.      [name] Kitchen omeprazole (PRILOSEC) 40 MG capsule Take 40 mg by mouth daily.      [name] Kitchen oxyCODONE-acetaminophen (PERCOCET/ROXICET) 5-325 MG per tablet Take 1-2 tablets by mouth every 4 (four) hours as needed.  40 tablet  0  . polyethylene glycol (MIRALAX / GLYCOLAX) packet Take 17 g by mouth daily as needed.  1 each    . tadalafil (CIALIS) 20 MG tablet Take 20 mg by mouth daily as needed. Before intercourse.      . warfarin (COUMADIN) 5 MG tablet Take 5-10 mg by mouth daily. Take 1 tablet along with 7.5 tablet on Monday,Wednesday, & Friday to make a total of 12.5mg . Take 2 tablets (10mg ) on all other days.      [name] Kitchen warfarin  (COUMADIN) 7.5 MG tablet Take 7.5 mg by mouth every Monday, Wednesday, and Friday. Take along with 5mg  tablet to make a total dose of 12.5mg       . furosemide (LASIX) 40 MG tablet Take 40 mg by mouth daily as needed. For increased fluid         No Known Allergies  ROS: Constitutional:  No chills, sweats.  Weight stable. +fevers Eyes:  No vision changes, No discharge HENT:  No sore throats, nasal drainage Lymph: No neck swelling, No bruising easily Pulmonary:  No cough, productive sputum CV: No orthopnea, PND  Patient walks 30 minutes for about 1-2 miles on the treadmill without difficulty.  No exertional chest/neck/shoulder/arm pain. GI: No personal nor family history of GI/colon cancer, inflammatory bowel disease, irritable bowel syndrome, allergy such as Celiac Sprue, dietary/dairy problems, colitis, ulcers nor gastritis.  No recent sick contacts/gastroenteritis.  No travel outside the country.  No changes in diet. Renal: No UTIs, No hematuria Genital:  No drainage, bleeding, masses Musculoskeletal: No severe joint pain.  Good ROM major joints Skin:  No sores or lesions.  No rashes  Heme/Lymph:  Easy bleeding.  No swollen lymph nodes  BP 119/65  Pulse 106  Temp 100.7 F (38.2 C) (Oral)  Resp 16  SpO2 93%  Physical Exam: General: Pt awake/alert/oriented x4 in no major acute distress Eyes: PERRL, normal EOM. Sclera nonicteric Neuro: CN II-XII intact w/o focal sensory/motor deficits. Lymph: No head/neck/groin lymphadenopathy Psych:  No delerium/psychosis/paranoia HENT: Normocephalic, Mucus membranes moist.  No thrush Neck: Supple, No tracheal deviation Chest: No pain.  Good respiratory excursion. CTA bilaterally CV:  Pulses intact.  Regular rhythm Abdomen: Soft, Obese with mod panniculus.  Nondistended.  Mild/mod TTP R flank with 20x20cm mass unchanged from yesterday.  Incisions closed w/o cellulitis.  No incarcerated hernias. Ext:  SCDs BLE.  No significant edema.  No  cyanosis Skin: No petechiae / purpurae  Results:   Labs: Results for orders placed during the hospital encounter of 09/21/12 (from the past 48 hour(s))  CBC WITH DIFFERENTIAL     Status: Abnormal   Collection Time   09/21/12 11:45 PM      Component Value Range Comment   WBC 16.9 (*) 4.0 - 10.5 K/uL    RBC 4.28  4.22 - 5.81 MIL/uL    Hemoglobin 12.9 (*) 13.0 - 17.0 g/dL    HCT 45.4 (*) 09.8 - 52.0 %    MCV 90.0  78.0 - 100.0 fL    MCH 30.1  26.0 - 34.0 pg    MCHC 33.5  30.0 - 36.0 g/dL    RDW 11.9  14.7 - 82.9 %    Platelets 387  150 - 400 K/uL    Neutrophils Relative 81 (*) 43 - 77 %    Neutro Abs 13.8 (*) 1.7 - 7.7 K/uL    Lymphocytes Relative 8 (*) 12 - 46 %    Lymphs Abs 1.4  0.7 - 4.0 K/uL    Monocytes Relative 10  3 - 12 %    Monocytes Absolute 1.6 (*) 0.1 - 1.0 K/uL    Eosinophils Relative 1  0 - 5 %    Eosinophils Absolute 0.1  0.0 - 0.7 K/uL    Basophils Relative 0  0 - 1 %    Basophils Absolute 0.0  0.0 - 0.1 K/uL   COMPREHENSIVE METABOLIC PANEL     Status: Abnormal   Collection Time   09/21/12 11:45 PM      Component Value Range Comment   Sodium 131 (*) 135 - 145 mEq/L    Potassium 3.6  3.5 - 5.1 mEq/L    Chloride 96  96 - 112 mEq/L    CO2 26  19 - 32 mEq/L    Glucose, Bld 126 (*) 70 - 99 mg/dL    BUN 8  6 - 23 mg/dL    Creatinine, Ser 5.62  0.50 - 1.35 mg/dL    Calcium 8.2 (*) 8.4 - 10.5 mg/dL    Total Protein 6.5  6.0 - 8.3 g/dL    Albumin 2.3 (*) 3.5 - 5.2 g/dL    AST 20  0 - 37 U/L    ALT 24  0 - 53 U/L    Alkaline Phosphatase 117  39 - 117 U/L    Total Bilirubin 0.6  0.3 - 1.2 mg/dL    GFR calc non Af Amer >90  >90 mL/min    GFR calc Af Amer >90  >90 mL/min   PROTIME-INR     Status: Abnormal   Collection Time   09/21/12 11:45 PM  Component Value Range Comment   Prothrombin Time 23.9 (*) 11.6 - 15.2 seconds    INR 2.25 (*) 0.00 - 1.49   URINALYSIS, MICROSCOPIC ONLY     Status: Abnormal   Collection Time   09/22/12  3:38 AM      Component Value  Range Comment   Color, Urine AMBER (*) YELLOW BIOCHEMICALS MAY BE AFFECTED BY COLOR   APPearance CLEAR  CLEAR    Specific Gravity, Urine 1.037 (*) 1.005 - 1.030    pH 6.0  5.0 - 8.0    Glucose, UA NEGATIVE  NEGATIVE mg/dL    Hgb urine dipstick NEGATIVE  NEGATIVE    Bilirubin Urine SMALL (*) NEGATIVE    Ketones, ur NEGATIVE  NEGATIVE mg/dL    Protein, ur 30 (*) NEGATIVE mg/dL    Urobilinogen, UA 2.0 (*) 0.0 - 1.0 mg/dL    Nitrite NEGATIVE  NEGATIVE    Leukocytes, UA TRACE (*) NEGATIVE    WBC, UA 0-2  <3 WBC/hpf     Imaging / Studies: Ct Abdomen Pelvis W Contrast  09/11/2012  *RADIOLOGY REPORT*  Clinical Data: Right lower quadrant and flank pain for 1 week. Evaluate for appendicitis.  CT ABDOMEN AND PELVIS WITH CONTRAST  Technique:  Multidetector CT imaging of the abdomen and pelvis was performed following the standard protocol during bolus administration of intravenous contrast.  Contrast: OMNIPAQUE IOHEXOL 300 MG/ML  SOLN  Comparison: None.  Findings: Clear lung bases.  Mild cardiomegaly.  A small to moderate hiatal hernia.  Normal liver, spleen, stomach, pancreas, gallbladder, biliary tract, adrenal glands, kidneys.  No retroperitoneal or retrocrural adenopathy.  Normal colon and terminal ileum.  The proximal appendix is inflamed, with an appendicolith within.  Example image 47.  The appendiceal tip is relatively uninvolved, and is air filled.  There is no perforation or surrounding abscess. There are prominent nodes in the ileocolic mesentery; likely reactive.  Normal small bowel without abdominal ascites.    No pelvic adenopathy.    Normal urinary bladder and prostate.  No significant free fluid.  No acute osseous abnormality.  IMPRESSION:  1.  Findings of uncomplicated moderate appendicitis. These results will be called to the ordering clinician or representative by the Radiologist Assistant, and communication documented in the PACS Dashboard. 2. Small to moderate hiatal hernia.    Original Report Authenticated By: Consuello Bossier, M.D.     Medications / Allergies: per chart  Antibiotics: Anti-infectives     Start     Dose/Rate Route Frequency Ordered Stop   09/22/12 0545  piperacillin-tazobactam (ZOSYN) IVPB 3.375 g       3.375 g 12.5 mL/hr over 240 Minutes Intravenous 3 times per day 09/22/12 0542     09/22/12 0430   ertapenem (INVANZ) 1 g in sodium chloride 0.9 % 50 mL IVPB        1 g 100 mL/hr over 30 Minutes Intravenous  Once 09/22/12 0416 09/22/12 0457          Assessment  Gregory Cortez  56 y.o. male       Problem List:  Principal Problem:  *Abscess, retroperitoneal Active Problems:  Anxiety state, unspecified  SLEEP APNEA, OBSTRUCTIVE  GERD  Chronic anticoagulation for h/o Pulmonary embolism  Primary hypercoagulable state  Obesity, Class III, BMI 40-49.9 (morbid obesity)  Appendicitis with peritonitis s/p lap appy 09/13/2012  Retroperitoneal abscess/phlegmon s/p lap appy for appendicitis   Fully anticoagulated patient  Plan:  -Admit -IV ABx -Hold anticoagulation orally.  Start  enoxaparin 1mg /kg BID once INR <2. -Once INR less than 1.5, proceed with CT-guided perc drainage of abscess: -CPAP for OSA -anxiolysis -VTE prophylaxis- SCDs, etc -mobilize as tolerated to help recovery  Ardeth Sportsman, M.D., F.A.C.S. Gastrointestinal and Minimally Invasive Surgery [facility] [address], [address] [place], [place] [address] [phone] Main / Paging [phone] Voice Mail   09/22/2012

## 2012-09-22 NOTE — H&P (Signed)
Gregory Cortez is an 56 y.o. male.   Chief Complaint: Lap Appy 9 days ago Developed rt flank and abd pain; fever; nausea CT shows Rt retroperitoneal abscess formation Scheduled now for aspiration vs drain placement Has been on coumadin for PE Off now for 3-4 days; INR 2.03 today Will re check in am HPI: sleep apnea; GERD; PE; obese  Past Medical History  Diagnosis Date  . OSA (obstructive sleep apnea)     dr Shelle Iron  . Testosterone deficiency     dr Patsi Sears, shots every 2 weeks  . ED (erectile dysfunction)   . GERD (gastroesophageal reflux disease)     [facility]  . Low back pain     dr Channing Mutters, dr Farris Has, dr Ethelene Hal, herniated disc L4-5  . PE (pulmonary embolism)     bilateral sep 2011 and again bilateral May 2012  . 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 Primary hypercoagulable state     no etiology found per Dr. Shirline Frees   . Hx of colonoscopy   . Pulmonary embolism 02/22/2010    CT angio Dx    Past Surgical History  Procedure Date  . Knee surgery     both  . Shoulder surgery     right and left  . Esi     L4-5 dr Channing Mutters 03/2010  . Rotator cuff repair 08-20-10    left dr Wyline Mood complicated by PE  . Esophagogastroduodenoscopy     x2 - normal except reflux  . Laparoscopic appendectomy 09/13/2012    Procedure: APPENDECTOMY LAPAROSCOPIC;  Surgeon: Almond Lint, MD;  Location: [facility];  Service: General;  Laterality: N/A;    Family History  Problem Relation Age of Onset  . Hypertension Other   . Cancer Other     lung  . Coronary artery disease Neg Hx    Social History:  reports that he has never smoked. He has never used smokeless tobacco. He reports that he does not drink alcohol or use illicit drugs.  Allergies: No Known Allergies  Medications Prior to Admission  Medication Sig Dispense Refill  . ALPRAZolam (XANAX) 1 MG tablet Take 1 mg by mouth 3 (three) times daily as needed. Anxiety.      . celecoxib (CELEBREX) 200 MG capsule Take 200 mg by mouth daily as needed.  Pain.      . enoxaparin (LOVENOX) 150 MG/ML injection Inject 1 mL (150 mg total) into the skin every 12 (twelve) hours.  8 Syringe  0  . folic acid (FOLVITE) 1 MG tablet Take 1 mg by mouth daily.      . Multiple Vitamin (MULTI-VITAMIN DAILY PO) Take by mouth every morning.      [name] Kitchen omeprazole (PRILOSEC) 40 MG capsule Take 40 mg by mouth daily.      [name] Kitchen oxyCODONE-acetaminophen (PERCOCET/ROXICET) 5-325 MG per tablet Take 1-2 tablets by mouth every 4 (four) hours as needed.  40 tablet  0  . polyethylene glycol (MIRALAX / GLYCOLAX) packet Take 17 g by mouth daily as needed.  1 each    . tadalafil (CIALIS) 20 MG tablet Take 20 mg by mouth daily as needed. Before intercourse.      . warfarin (COUMADIN) 5 MG tablet Take 5-10 mg by mouth daily. Take 1 tablet along with 7.5 tablet on Monday,Wednesday, & Friday to make a total of 12.5mg . Take 2 tablets (10mg ) on all other days.      [name] Kitchen warfarin (COUMADIN) 7.5 MG tablet Take  7.5 mg by mouth every Monday, Wednesday, and Friday. Take along with 5mg  tablet to make a total dose of 12.5mg       . furosemide (LASIX) 40 MG tablet Take 40 mg by mouth daily as needed. For increased fluid        Results for orders placed during the hospital encounter of 09/21/12 (from the past 48 hour(s))  CBC WITH DIFFERENTIAL     Status: Abnormal   Collection Time   09/21/12 11:45 PM      Component Value Range Comment   WBC 16.9 (*) 4.0 - 10.5 K/uL    RBC 4.28  4.22 - 5.81 MIL/uL    Hemoglobin 12.9 (*) 13.0 - 17.0 g/dL    HCT 52.8 (*) 41.3 - 52.0 %    MCV 90.0  78.0 - 100.0 fL    MCH 30.1  26.0 - 34.0 pg    MCHC 33.5  30.0 - 36.0 g/dL    RDW 24.4  01.0 - 27.2 %    Platelets 387  150 - 400 K/uL    Neutrophils Relative 81 (*) 43 - 77 %    Neutro Abs 13.8 (*) 1.7 - 7.7 K/uL    Lymphocytes Relative 8 (*) 12 - 46 %    Lymphs Abs 1.4  0.7 - 4.0 K/uL    Monocytes Relative 10  3 - 12 %    Monocytes Absolute 1.6 (*) 0.1 - 1.0 K/uL    Eosinophils Relative 1  0 - 5 %    Eosinophils  Absolute 0.1  0.0 - 0.7 K/uL    Basophils Relative 0  0 - 1 %    Basophils Absolute 0.0  0.0 - 0.1 K/uL   COMPREHENSIVE METABOLIC PANEL     Status: Abnormal   Collection Time   09/21/12 11:45 PM      Component Value Range Comment   Sodium 131 (*) 135 - 145 mEq/L    Potassium 3.6  3.5 - 5.1 mEq/L    Chloride 96  96 - 112 mEq/L    CO2 26  19 - 32 mEq/L    Glucose, Bld 126 (*) 70 - 99 mg/dL    BUN 8  6 - 23 mg/dL    Creatinine, Ser 5.36  0.50 - 1.35 mg/dL    Calcium 8.2 (*) 8.4 - 10.5 mg/dL    Total Protein 6.5  6.0 - 8.3 g/dL    Albumin 2.3 (*) 3.5 - 5.2 g/dL    AST 20  0 - 37 U/L    ALT 24  0 - 53 U/L    Alkaline Phosphatase 117  39 - 117 U/L    Total Bilirubin 0.6  0.3 - 1.2 mg/dL    GFR calc non Af Amer >90  >90 mL/min    GFR calc Af Amer >90  >90 mL/min   PROTIME-INR     Status: Abnormal   Collection Time   09/21/12 11:45 PM      Component Value Range Comment   Prothrombin Time 23.9 (*) 11.6 - 15.2 seconds    INR 2.25 (*) 0.00 - 1.49   URINALYSIS, MICROSCOPIC ONLY     Status: Abnormal   Collection Time   09/22/12  3:38 AM      Component Value Range Comment   Color, Urine AMBER (*) YELLOW BIOCHEMICALS MAY BE AFFECTED BY COLOR   APPearance CLEAR  CLEAR    Specific Gravity, Urine 1.037 (*) 1.005 - 1.030    pH 6.0  5.0 - 8.0    Glucose, UA NEGATIVE  NEGATIVE mg/dL    Hgb urine dipstick NEGATIVE  NEGATIVE    Bilirubin Urine SMALL (*) NEGATIVE    Ketones, ur NEGATIVE  NEGATIVE mg/dL    Protein, ur 30 (*) NEGATIVE mg/dL    Urobilinogen, UA 2.0 (*) 0.0 - 1.0 mg/dL    Nitrite NEGATIVE  NEGATIVE    Leukocytes, UA TRACE (*) NEGATIVE    WBC, UA 0-2  <3 WBC/hpf   PROTIME-INR     Status: Abnormal   Collection Time   09/22/12  9:27 AM      Component Value Range Comment   Prothrombin Time 22.1 (*) 11.6 - 15.2 seconds    INR 2.03 (*) 0.00 - 1.49    Ct Abdomen Pelvis W Contrast  09/22/2012  *RADIOLOGY REPORT*  Clinical Data: Pain and fever post appendectomy.  CT ABDOMEN AND PELVIS  WITH CONTRAST  Technique:  Multidetector CT imaging of the abdomen and pelvis was performed following the standard protocol during bolus administration of intravenous contrast.  Contrast: OMNIPAQUE IOHEXOL 300 MG/ML  SOLN  Comparison: 09/11/2012  Findings: There is a new small right pleural effusion.  There is dependent atelectasis or infiltrate in the visualized posterior right lung base.  There is a small hiatal hernia.  There is scattered abdominal ascites with the largest discrete component adjacent to the tip the liver, and smaller trace perisplenic and pelvic components as well as fluid in the left pericolic gutter, and among the leaves of the mesentery in the lower abdomen.  There are marked inflammatory/edematous changes in the right retroperitoneum in the region of the previous appendectomy, with a scattered fluid collections, largest discrete locule 2.8 x 6.7 cm. Staple line noted near the base of the cecum.  Multiple enlarged right lower quadrant mesenteric lymph nodes.  There is wall thickening in the proximal ascending colon. There are a few small loculated gas bubbles in the right anterolateral peritoneal cavity.  Unremarkable liver, gallbladder, spleen, adrenal glands, pancreas, kidneys, abdominal aorta.  The stomach and small bowel are non distended.  The colon is nondilated.  Urinary bladder is physiologically distended. No hydronephrosis or proximal ureterectasis.  Mild spondylitic changes in the lumbar spine.  IMPRESSION:  1.  Probable postop abscess in the right retroperitoneum, with significant adjacent inflammatory/edematous change. 2.  Scattered abdominal ascites without definite loculation. 3.  Small right pleural effusion. 4.  Hiatal hernia.   Original Report Authenticated By: Osa Craver, M.D.     Review of Systems  Constitutional: Negative for fever.  Cardiovascular: Negative for chest pain.  Gastrointestinal: Positive for nausea and abdominal pain.  Musculoskeletal:  Positive for back pain.    Blood pressure 129/81, pulse 98, temperature 98.4 F (36.9 C), temperature source Oral, resp. rate 18, height 5\' 10"  (1.778 m), weight 334 lb (151.501 kg), SpO2 91.00%. Physical Exam  Constitutional: He is oriented to person, place, and time. He appears well-developed and well-nourished.       Obese   Cardiovascular: Normal rate, regular rhythm and normal heart sounds.   No murmur heard. Respiratory: Effort normal and breath sounds normal. He has no wheezes.  GI: Soft. Bowel sounds are normal.       Still with healing puncture site from previous JP - RLQ Has small skin blister forming superior to this site  Musculoskeletal: Normal range of motion.  Neurological: He is alert and oriented to person, place, and time.  Psychiatric: He has a normal  mood and affect. His behavior is normal. Judgment and thought content normal.     Assessment/Plan Post op R paracolic abscess  Lap Appy 9 days ago Scheduled now for abscess aspiration vs drain placement Tentatively scheduled for 10/6 INR 2.03 today; will check in am - off coumadin Will hold lovenox injection in am Pt aware of procedure benefits and risks and agreeable to proceed. Consent signed and in chart  Myalynn Lingle A 09/22/2012, 10:34 AM

## 2012-09-23 ENCOUNTER — Inpatient Hospital Stay (HOSPITAL_COMMUNITY): Payer: BC Managed Care – PPO

## 2012-09-23 LAB — CBC
HCT: 33.8 % — ABNORMAL LOW (ref 39.0–52.0)
Hemoglobin: 11.1 g/dL — ABNORMAL LOW (ref 13.0–17.0)
MCH: 29.5 pg (ref 26.0–34.0)
MCHC: 32.8 g/dL (ref 30.0–36.0)
MCV: 89.9 fL (ref 78.0–100.0)
Platelets: 357 10*3/uL (ref 150–400)
RBC: 3.76 MIL/uL — ABNORMAL LOW (ref 4.22–5.81)
RDW: 13.3 % (ref 11.5–15.5)
WBC: 17.5 10*3/uL — ABNORMAL HIGH (ref 4.0–10.5)

## 2012-09-23 LAB — POTASSIUM: Potassium: 3.7 mEq/L (ref 3.5–5.1)

## 2012-09-23 LAB — CREATININE, SERUM
Creatinine, Ser: 0.77 mg/dL (ref 0.50–1.35)
GFR calc Af Amer: >90 mL/min (ref >90)
GFR calc non Af Amer: >90 mL/min (ref >90)

## 2012-09-23 LAB — PROTIME-INR
INR: 1.98 — ABNORMAL HIGH (ref 0.00–1.49)
Prothrombin Time: 21.7 seconds — ABNORMAL HIGH (ref 11.6–15.2)

## 2012-09-23 MED ORDER — ENOXAPARIN SODIUM 30 MG/0.3ML ~~LOC~~ SOLN
150.0000 mg | Freq: Two times a day (BID) | SUBCUTANEOUS | Status: DC
  Filled 2012-09-23 (×3): qty 0.3

## 2012-09-23 MED ORDER — FENTANYL CITRATE 0.05 MG/ML IJ SOLN
INTRAMUSCULAR | Status: AC | PRN
  Administered 2012-09-23 (×2): 100 ug via INTRAVENOUS

## 2012-09-23 MED ORDER — ENOXAPARIN SODIUM 150 MG/ML ~~LOC~~ SOLN
150.0000 mg | Freq: Two times a day (BID) | SUBCUTANEOUS | Status: DC
Start: 2012-09-23 — End: 2012-10-01
  Administered 2012-09-23 – 2012-10-01 (×16): 150 mg via SUBCUTANEOUS
  Filled 2012-09-23 (×20): qty 1

## 2012-09-23 MED ORDER — MIDAZOLAM HCL 5 MG/5ML IJ SOLN
INTRAMUSCULAR | Status: AC | PRN
  Administered 2012-09-23 (×2): 1 mg via INTRAVENOUS

## 2012-09-23 MED ORDER — MIDAZOLAM HCL 2 MG/2ML IJ SOLN
INTRAMUSCULAR | Status: AC
  Filled 2012-09-23: qty 6

## 2012-09-23 MED ORDER — FENTANYL CITRATE 0.05 MG/ML IJ SOLN
INTRAMUSCULAR | Status: AC
  Filled 2012-09-23: qty 6

## 2012-09-23 NOTE — Progress Notes (Addendum)
Gregory Cortez [id number] 1956/01/15   Subjective:  Family concerned about fevers yesterday  Daughter convinced he has another PE given he has hiccups & that was the only Sx when he was originally Dx with PE years ago.  Pt denies SOB or difficulty breathing aside from soreness at mass with deep breath/cough.  No hiccoughing now  Drained today - old blood in drain  Sitting up, eating popsicles with family in room  Objective:  Vital signs:  Filed Vitals:   09/22/12 1800 09/22/12 2144 09/23/12 0553 09/23/12 1028  BP: 144/78 135/67 153/81 127/65  Pulse: 103 96 94 105  Temp: 102.7 F (39.3 C) 99.1 F (37.3 C) 100.2 F (37.9 C)   TempSrc: Oral Oral Oral   Resp: 18 20 18 12   Height:      Weight:      SpO2: 94% 93% 95% 97%    Last BM Date: 09/22/12  Intake/Output   Yesterday:  10/05 0701 - 10/06 0700 In: 2623.3 [P.O.:480; I.V.:1993.3; IV Piggyback:150] Out: 1700 [Urine:1700] This shift:     Bowel function:  Flatus: y  BM: n  Physical Exam:  General: Pt awake/alert/oriented x4 in no acute distress Eyes: PERRL, normal EOM.  Sclera clear.  No icterus Neuro: CN II-XII intact w/o focal sensory/motor deficits. Lymph: No head/neck/groin lymphadenopathy Psych:  No delerium/psychosis/paranoia HENT: Normocephalic, Mucus membranes moist.  No thrush Neck: Supple, No tracheal deviation Chest: No chest wall pain w good excursion CV:  Pulses intact.  Regular rhythm Abdomen: Soft.  Nondistended.  Mod tender RUQ / flank mass.  No incarcerated hernias. Ext:  SCDs BLE.  No mjr edema.  No cyanosis Skin: No petechiae / purpurae.  Small RLQ abd wall skin blister at old tape site  Problem List:  Principal Problem:  *Abscess, retroperitoneal Active Problems:  Anxiety state, unspecified  SLEEP APNEA, OBSTRUCTIVE  GERD  Chronic anticoagulation for h/o Pulmonary embolism  Primary hypercoagulable state  Obesity, Class III, BMI 40-49.9 (morbid obesity)  Appendicitis with  peritonitis s/p lap appy 09/13/2012   Assessment  Gregory Cortez  56 y.o. male       Stable.   Plan:  -cont IV ABX  -Fevers expected w abscess.  Should resolve 1-2 days after drain & IV ABX.  follow  -drain of abscesses  -CT chest r/o PE if still w Sx (hiccups, pain w deep breaths) after drain.  Not ideal to give pt a 2nd IV contrast bolus.  Pt getting full anticoagulation already (aside from brief period around time of drain placement).  Explained this to family yesterday  -VTE prophylaxis- SCDs, etc  -mobilize as tolerated to help recovery  -adv diet as tolerated - slow to start since just got back.  -anticoag after drain when safe.  Partial to start, then transition to full Lovenox bridge / oral warfarin.  Pt already of mindset to  f/u coumadin clinic as outpatient soon.  I agreed it was a good idea given getting PO ABx & help have close f/u  I discussed the patient's status to the Patient & family.  Questions were answered.  They expressed understanding & appreciation.   Ardeth Sportsman, M.D., F.A.C.S. Gastrointestinal and Minimally Invasive Surgery [facility] [address] [place], [place] [address] [phone] Main / Paging [phone] Voice Mail   09/23/2012  CARE TEAM:  PCP: Nelwyn Salisbury, MD  Outpatient Care Team: Patient Care Team: Nelwyn Salisbury, MD as PCP - General Nile Dear  Clifton James, MD (Cardiology) Barbaraann Share, MD as Consulting Physician (Pulmonary Disease)  Inpatient Treatment Team: Treatment Team: Attending Provider: Md Montez Morita, MD; Technician: [name], NT; Registered Nurse: Ashley Akin, RN; Consulting Physician: Md Montez Morita, MD; Registered Nurse: Bethann Goo, RN; Registered Nurse: Rometta Emery, RN; Technician: Burnard Bunting, [place]   Results:   Labs: Results for orders placed during the hospital encounter of 09/21/12 (from the past 48 hour(s))  CBC WITH DIFFERENTIAL     Status:  Abnormal   Collection Time   09/21/12 11:45 PM      Component Value Range Comment   WBC 16.9 (*) 4.0 - 10.5 K/uL    RBC 4.28  4.22 - 5.81 MIL/uL    Hemoglobin 12.9 (*) 13.0 - 17.0 g/dL    HCT 14.7 (*) 82.9 - 52.0 %    MCV 90.0  78.0 - 100.0 fL    MCH 30.1  26.0 - 34.0 pg    MCHC 33.5  30.0 - 36.0 g/dL    RDW 56.2  13.0 - 86.5 %    Platelets 387  150 - 400 K/uL    Neutrophils Relative 81 (*) 43 - 77 %    Neutro Abs 13.8 (*) 1.7 - 7.7 K/uL    Lymphocytes Relative 8 (*) 12 - 46 %    Lymphs Abs 1.4  0.7 - 4.0 K/uL    Monocytes Relative 10  3 - 12 %    Monocytes Absolute 1.6 (*) 0.1 - 1.0 K/uL    Eosinophils Relative 1  0 - 5 %    Eosinophils Absolute 0.1  0.0 - 0.7 K/uL    Basophils Relative 0  0 - 1 %    Basophils Absolute 0.0  0.0 - 0.1 K/uL   COMPREHENSIVE METABOLIC PANEL     Status: Abnormal   Collection Time   09/21/12 11:45 PM      Component Value Range Comment   Sodium 131 (*) 135 - 145 mEq/L    Potassium 3.6  3.5 - 5.1 mEq/L    Chloride 96  96 - 112 mEq/L    CO2 26  19 - 32 mEq/L    Glucose, Bld 126 (*) 70 - 99 mg/dL    BUN 8  6 - 23 mg/dL    Creatinine, Ser 7.84  0.50 - 1.35 mg/dL    Calcium 8.2 (*) 8.4 - 10.5 mg/dL    Total Protein 6.5  6.0 - 8.3 g/dL    Albumin 2.3 (*) 3.5 - 5.2 g/dL    AST 20  0 - 37 U/L    ALT 24  0 - 53 U/L    Alkaline Phosphatase 117  39 - 117 U/L    Total Bilirubin 0.6  0.3 - 1.2 mg/dL    GFR calc non Af Amer >90  >90 mL/min    GFR calc Af Amer >90  >90 mL/min   PROTIME-INR     Status: Abnormal   Collection Time   09/21/12 11:45 PM      Component Value Range Comment   Prothrombin Time 23.9 (*) 11.6 - 15.2 seconds    INR 2.25 (*) 0.00 - 1.49   URINALYSIS, MICROSCOPIC ONLY     Status: Abnormal   Collection Time   09/22/12  3:38 AM      Component Value Range Comment   Color, Urine AMBER (*) YELLOW BIOCHEMICALS MAY BE AFFECTED BY COLOR   APPearance CLEAR  CLEAR    Specific Gravity,  Urine 1.037 (*) 1.005 - 1.030    pH 6.0  5.0 - 8.0     Glucose, UA NEGATIVE  NEGATIVE mg/dL    Hgb urine dipstick NEGATIVE  NEGATIVE    Bilirubin Urine SMALL (*) NEGATIVE    Ketones, ur NEGATIVE  NEGATIVE mg/dL    Protein, ur 30 (*) NEGATIVE mg/dL    Urobilinogen, UA 2.0 (*) 0.0 - 1.0 mg/dL    Nitrite NEGATIVE  NEGATIVE    Leukocytes, UA TRACE (*) NEGATIVE    WBC, UA 0-2  <3 WBC/hpf   PROTIME-INR     Status: Abnormal   Collection Time   09/22/12  9:27 AM      Component Value Range Comment   Prothrombin Time 22.1 (*) 11.6 - 15.2 seconds    INR 2.03 (*) 0.00 - 1.49   PROTIME-INR     Status: Abnormal   Collection Time   09/22/12  5:45 PM      Component Value Range Comment   Prothrombin Time 23.2 (*) 11.6 - 15.2 seconds    INR 2.16 (*) 0.00 - 1.49   CBC     Status: Abnormal   Collection Time   09/23/12  4:22 AM      Component Value Range Comment   WBC 17.5 (*) 4.0 - 10.5 K/uL    RBC 3.76 (*) 4.22 - 5.81 MIL/uL    Hemoglobin 11.1 (*) 13.0 - 17.0 g/dL    HCT 95.2 (*) 84.1 - 52.0 %    MCV 89.9  78.0 - 100.0 fL    MCH 29.5  26.0 - 34.0 pg    MCHC 32.8  30.0 - 36.0 g/dL    RDW 32.4  40.1 - 02.7 %    Platelets 357  150 - 400 K/uL   CREATININE, SERUM     Status: Normal   Collection Time   09/23/12  4:22 AM      Component Value Range Comment   Creatinine, Ser 0.77  0.50 - 1.35 mg/dL    GFR calc non Af Amer >90  >90 mL/min    GFR calc Af Amer >90  >90 mL/min   POTASSIUM     Status: Normal   Collection Time   09/23/12  4:22 AM      Component Value Range Comment   Potassium 3.7  3.5 - 5.1 mEq/L   PROTIME-INR     Status: Abnormal   Collection Time   09/23/12  4:22 AM      Component Value Range Comment   Prothrombin Time 21.7 (*) 11.6 - 15.2 seconds    INR 1.98 (*) 0.00 - 1.49     Imaging / Studies: Ct Abdomen Pelvis W Contrast  09/22/2012  *RADIOLOGY REPORT*  Clinical Data: Pain and fever post appendectomy.  CT ABDOMEN AND PELVIS WITH CONTRAST  Technique:  Multidetector CT imaging of the abdomen and pelvis was performed following the  standard protocol during bolus administration of intravenous contrast.  Contrast: OMNIPAQUE IOHEXOL 300 MG/ML  SOLN  Comparison: 09/11/2012  Findings: There is a new small right pleural effusion.  There is dependent atelectasis or infiltrate in the visualized posterior right lung base.  There is a small hiatal hernia.  There is scattered abdominal ascites with the largest discrete component adjacent to the tip the liver, and smaller trace perisplenic and pelvic components as well as fluid in the left pericolic gutter, and among the leaves of the mesentery in the lower abdomen.  There are marked inflammatory/edematous  changes in the right retroperitoneum in the region of the previous appendectomy, with a scattered fluid collections, largest discrete locule 2.8 x 6.7 cm. Staple line noted near the base of the cecum.  Multiple enlarged right lower quadrant mesenteric lymph nodes.  There is wall thickening in the proximal ascending colon. There are a few small loculated gas bubbles in the right anterolateral peritoneal cavity.  Unremarkable liver, gallbladder, spleen, adrenal glands, pancreas, kidneys, abdominal aorta.  The stomach and small bowel are non distended.  The colon is nondilated.  Urinary bladder is physiologically distended. No hydronephrosis or proximal ureterectasis.  Mild spondylitic changes in the lumbar spine.  IMPRESSION:  1.  Probable postop abscess in the right retroperitoneum, with significant adjacent inflammatory/edematous change. 2.  Scattered abdominal ascites without definite loculation. 3.  Small right pleural effusion. 4.  Hiatal hernia.   Original Report Authenticated By: Osa Craver, M.D.     Medications / Allergies: per chart  Antibiotics: Anti-infectives     Start     Dose/Rate Route Frequency Ordered Stop   09/22/12 0545  piperacillin-tazobactam (ZOSYN) IVPB 3.375 g       3.375 g 12.5 mL/hr over 240 Minutes Intravenous 3 times per day 09/22/12 0542      09/22/12 0430   ertapenem (INVANZ) 1 g in sodium chloride 0.9 % 50 mL IVPB        1 g 100 mL/hr over 30 Minutes Intravenous  Once 09/22/12 0416 09/22/12 0457

## 2012-09-23 NOTE — Procedures (Signed)
Procedure:  CT guided drainage of RLQ peritoneal abscess [place]:  Grossly purulent, foul-smelling fluid aspirated and sent for culture.  12 Fr drain placed and attached to suction bulb.

## 2012-09-24 ENCOUNTER — Other Ambulatory Visit (HOSPITAL_COMMUNITY): Payer: BC Managed Care – PPO

## 2012-09-24 LAB — CBC
HCT: 31.2 % — ABNORMAL LOW (ref 39.0–52.0)
Hemoglobin: 10.4 g/dL — ABNORMAL LOW (ref 13.0–17.0)
MCH: 30.2 pg (ref 26.0–34.0)
MCHC: 33.3 g/dL (ref 30.0–36.0)
MCV: 90.7 fL (ref 78.0–100.0)
Platelets: 349 10*3/uL (ref 150–400)
RBC: 3.44 MIL/uL — ABNORMAL LOW (ref 4.22–5.81)
RDW: 13.3 % (ref 11.5–15.5)
WBC: 16.9 10*3/uL — ABNORMAL HIGH (ref 4.0–10.5)

## 2012-09-24 LAB — PROTIME-INR
INR: 1.64 — ABNORMAL HIGH (ref 0.00–1.49)
Prothrombin Time: 18.9 seconds — ABNORMAL HIGH (ref 11.6–15.2)

## 2012-09-24 MED ORDER — WARFARIN SODIUM 2.5 MG PO TABS
12.5000 mg | ORAL_TABLET | Freq: Once | ORAL | Status: AC
  Administered 2012-09-24: 12.5 mg via ORAL
  Filled 2012-09-24: qty 1

## 2012-09-24 MED ORDER — WARFARIN - PHARMACIST DOSING INPATIENT: Freq: Every day | Status: DC

## 2012-09-24 NOTE — Progress Notes (Signed)
Patient ID: Gregory Cortez, male   DOB: 1956-02-20, 56 y.o.   MRN: [id number]    PCP - Dr. Laurice Record Onco - Dr. Ross Marcus  Subjective: Pt feels ok, a bit nauseated, but thinks that may be because he hasn't eaten yet today.  Had 2 BMs yesterday.  Minimal pain  Objective: Vital signs in last 24 hours: Temp:  [97.7 F (36.5 C)-100.2 F (37.9 C)] 97.7 F (36.5 C) (10/07 0550) Pulse Rate:  [76-112] 76  (10/07 0550) Resp:  [12-20] 18  (10/07 0550) BP: (99-159)/(57-77) 111/70 mmHg (10/07 0550) SpO2:  [92 %-98 %] 95 % (10/07 0550) Last BM Date: 09/22/12  Intake/Output from previous day: 10/06 0701 - 10/07 0700 In: 2765 [P.O.:240; I.V.:2345; IV Piggyback:150] Out: 1255 [Urine:1075; Drains:180] Intake/Output this shift:    PE: Abd: soft, obese, few BS, minimally tender, JP with cloudy serous output  Some fullness and mild redness around drain in right abdomen. Ht: regular Lungs: CTAB  Lab Results:   Basename 09/24/12 0400 09/23/12 0422  WBC 16.9* 17.5*  HGB 10.4* 11.1*  HCT 31.2* 33.8*  PLT 349 357   BMET  Basename 09/23/12 0422 09/21/12 2345  NA -- 131*  K 3.7 3.6  CL -- 96  CO2 -- 26  GLUCOSE -- 126*  BUN -- 8  CREATININE 0.77 0.86  CALCIUM -- 8.2*   PT/INR  Basename 09/24/12 0400 09/23/12 0422  LABPROT 18.9* 21.7*  INR 1.64* 1.98*   CMP     Component Value Date/Time   NA 131* 09/21/2012 2345   K 3.7 09/23/2012 0422   CL 96 09/21/2012 2345   CO2 26 09/21/2012 2345   GLUCOSE 126* 09/21/2012 2345   BUN 8 09/21/2012 2345   CREATININE 0.77 09/23/2012 0422   CALCIUM 8.2* 09/21/2012 2345   PROT 6.5 09/21/2012 2345   ALBUMIN 2.3* 09/21/2012 2345   AST 20 09/21/2012 2345   ALT 24 09/21/2012 2345   ALKPHOS 117 09/21/2012 2345   BILITOT 0.6 09/21/2012 2345   GFRNONAA >90 09/23/2012 0422   GFRAA >90 09/23/2012 0422   Lipase  No results found for this basename: lipase   Studies/Results: Ct Guided Abscess Drain  09/23/2012  *RADIOLOGY REPORT*  Clinical Data: Status post  appendectomy for acute appendicitis on 09/13/2012.  Postoperative inflammatory changes and probable abscess identified in the right lower quadrant on follow-up CT. The patient now presents for CT guided drainage.  CT GUIDED DRAINAGE OF PERITONEAL ABSCESS  Sedation:  3.0 mg IV Versed;  200 mcg IV Fentanyl  Total Moderate Sedation Time: 23 minutes.  Procedure:  The procedure, risks, benefits, and alternatives were explained to the patient.  Questions regarding the procedure were encouraged and answered. The patient understands and consents to the procedure.  The right lower abdominal wall was prepped with Betadine in a sterile fashion, and a sterile drape was applied covering the operative field.  A sterile gown and sterile gloves were used for the procedure. Local anesthesia was provided with 1% Lidocaine.  CT was performed in a supine position.  After localizing a dominant pocket of fluid in the right lower quadrant, an 18 gauge needle was advanced into the peritoneal cavity.  Aspiration of fluid was performed and a sample sent for culture analysis.  A guidewire was advanced into the collection.  The tract was dilated and a 12- French pigtail drainage catheter placed.  Catheter position was confirmed by CT.  The catheter was flushed and connected to a suction bulb.  It was secured at the skin with a Prolene retention suture and adhesive Stat-Lock device.  Complications: None  Findings: Severe inflammatory changes and multiple pockets of fluid are again identified in the right lower quadrant.  A dominant pocket measures approximately 7.7 cm in greatest diameter. Aspiration at the level of this fluid collection yielded grossly purulent and foul-smelling fluid.  A drain was placed in the medial aspect of this abscess and was draining copious amounts of fluid after placement.  IMPRESSION: CT guided drainage of right lower quadrant peritoneal abscess with placement of 12-French drain.  A sample of purulent fluid was sent  for culture analysis.   Original Report Authenticated By: Reola Calkins, M.D.     Anti-infectives: Anti-infectives     Start     Dose/Rate Route Frequency Ordered Stop   09/22/12 0545  piperacillin-tazobactam (ZOSYN) IVPB 3.375 g       3.375 g 12.5 mL/hr over 240 Minutes Intravenous 3 times per day 09/22/12 0542     09/22/12 0430   ertapenem (INVANZ) 1 g in sodium chloride 0.9 % 50 mL IVPB        1 g 100 mL/hr over 30 Minutes Intravenous  Once 09/22/12 0416 09/22/12 0457         Assessment/Plan 1. S/p perc drain for post op fluid collection  09/23/2012  Wife at bedside.  Some redness/fullness around JP drain.  Will have to watch.  2. S/p lap appy for perf appy  F. Byerly - 09/13/2012 3. OSA 4. Anticoagulation for h/o PE  Had shoulder surgery 2011.  Followed by Dr. Myna Hidalgo. 5.  Morbid obesity  He lost 40+ pounds recently and won a contest at his work.  Plan: 1. Will continue IV abx therapy today.  WBC only came down to 16K 2. Await cultures, no prelim results yet 3. Mobilize 4. Cont drain 5. May resume coumadin.   LOS: 3 days   OSBORNE,KELLY E 09/24/2012, 9:22 AM Pager: [phone]  Overall better.  Some redness/fullness around JP drain.  Ovidio Kin, MD, [facility] Pager: [phone] Office phone:  [phone]

## 2012-09-24 NOTE — Progress Notes (Signed)
ANTICOAGULATION CONSULT NOTE - Initial Consult  Pharmacy Consult for Coumadin Indication: Hx PE  No Known Allergies  Patient Measurements: Height: 5\' 10"  (177.8 cm) Weight: 334 lb (151.501 kg) (stated --weighed in last 48hr post surgery) IBW/kg (Calculated) : 73   Vital Signs: Temp: 97.7 F (36.5 C) (10/07 0550) Temp src: Oral (10/07 0550) BP: 111/70 mmHg (10/07 0550) Pulse Rate: 76  (10/07 0550)  Labs:  Basename 09/24/12 0400 09/23/12 0422 09/22/12 1745 09/21/12 2345  HGB 10.4* 11.1* -- --  HCT 31.2* 33.8* -- 38.5*  PLT 349 357 -- 387  APTT -- -- -- --  LABPROT 18.9* 21.7* 23.2* --  INR 1.64* 1.98* 2.16* --  HEPARINUNFRC -- -- -- --  CREATININE -- 0.77 -- 0.86  CKTOTAL -- -- -- --  CKMB -- -- -- --  TROPONINI -- -- -- --    Estimated Creatinine Clearance: 157.7 ml/min (by C-G formula based on Cr of 0.77).   Medical History: Past Medical History  Diagnosis Date  . OSA (obstructive sleep apnea)     dr Shelle Iron  . Testosterone deficiency     dr Patsi Sears, shots every 2 weeks  . ED (erectile dysfunction)   . GERD (gastroesophageal reflux disease)     [facility]  . Low back pain     dr Channing Mutters, dr Farris Has, dr Ethelene Hal, herniated disc L4-5  . PE (pulmonary embolism)     bilateral sep 2011 and again bilateral May 2012  . 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 Primary hypercoagulable state     no etiology found per Dr. Shirline Frees   . Hx of colonoscopy   . Pulmonary embolism 02/22/2010    CT angio Dx    Medications:  Scheduled:    . acetaminophen  1,000 mg Oral TID  . enoxaparin  150 mg Subcutaneous BID  . fentaNYL      . folic acid  1 mg Oral Daily  . lip balm  1 application Topical BID  . midazolam      . multivitamins with iron  1 tablet Oral Daily  . pantoprazole  80 mg Oral Q1200  . piperacillin-tazobactam (ZOSYN)  IV  3.375 g Intravenous Q8H  . psyllium  1 packet Oral BID    Assessment:  55yo M w/ recent appendectomy, admitted with fever and flank pain.  Peritoneal abscess discovered and drained on 10/6.   Resuming Coumadin for hx PE. Bridging with Lovenox(MD dosing).  Home dose: 10mg  daily except 12.5mg  on MWF.  INR subtherapeutic. No bleeding reported/documented.  Goal of Therapy:  INR 2-3 Monitor platelets by anticoagulation protocol: Yes   Plan:   Coumadin 12.5mg  today.   Agree with Lovenox 150mg  SQ q12h.  Daily PT/INR and CBC.  Charolotte Eke, PharmD, pager [phone]. 09/24/2012,11:23 AM.

## 2012-09-24 NOTE — Progress Notes (Signed)
Subjective: Feels some better but still very tender at RL abdomen.   Objective: Vital signs in last 24 hours: Temp:  [97.7 F (36.5 C)-100.2 F (37.9 C)] 97.7 F (36.5 C) (10/07 0550) Pulse Rate:  [76-112] 76  (10/07 0550) Resp:  [12-20] 18  (10/07 0550) BP: (99-159)/(57-77) 111/70 mmHg (10/07 0550) SpO2:  [92 %-98 %] 95 % (10/07 0550) Last BM Date: 09/22/12  Intake/Output from previous day: 10/06 0701 - 10/07 0700 In: 2765 [P.O.:240; I.V.:2345; IV Piggyback:150] Out: 1255 [Urine:1075; Drains:180]    PE:  Drain intact with insertion site clean and dry.  180 ml output recorded since placement yesterday.  Cultures pending. Tan colored drainage in bag.  Significant erythema and tenderness along lower abdomen extending in to back area c/w cellulitis.  Patient encouraged should improve as drainage continues. Will start warm/cold compresses.   Lab Results:   Basename 09/24/12 0400 09/23/12 0422  WBC 16.9* 17.5*  HGB 10.4* 11.1*  HCT 31.2* 33.8*  PLT 349 357   BMET  Basename 09/23/12 0422 09/21/12 2345  NA -- 131*  K 3.7 3.6  CL -- 96  CO2 -- 26  GLUCOSE -- 126*  BUN -- 8  CREATININE 0.77 0.86  CALCIUM -- 8.2*   PT/INR  Basename 09/24/12 0400 09/23/12 0422  LABPROT 18.9* 21.7*  INR 1.64* 1.98*    Studies/Results: Ct Guided Abscess Drain  09/23/2012  *RADIOLOGY REPORT*  Clinical Data: Status post appendectomy for acute appendicitis on 09/13/2012.  Postoperative inflammatory changes and probable abscess identified in the right lower quadrant on follow-up CT. The patient now presents for CT guided drainage.  CT GUIDED DRAINAGE OF PERITONEAL ABSCESS  Sedation:  3.0 mg IV Versed;  200 mcg IV Fentanyl  Total Moderate Sedation Time: 23 minutes.  Procedure:  The procedure, risks, benefits, and alternatives were explained to the patient.  Questions regarding the procedure were encouraged and answered. The patient understands and consents to the procedure.  The right lower  abdominal wall was prepped with Betadine in a sterile fashion, and a sterile drape was applied covering the operative field.  A sterile gown and sterile gloves were used for the procedure. Local anesthesia was provided with 1% Lidocaine.  CT was performed in a supine position.  After localizing a dominant pocket of fluid in the right lower quadrant, an 18 gauge needle was advanced into the peritoneal cavity.  Aspiration of fluid was performed and a sample sent for culture analysis.  A guidewire was advanced into the collection.  The tract was dilated and a 12- French pigtail drainage catheter placed.  Catheter position was confirmed by CT.  The catheter was flushed and connected to a suction bulb.  It was secured at the skin with a Prolene retention suture and adhesive Stat-Lock device.  Complications: None  Findings: Severe inflammatory changes and multiple pockets of fluid are again identified in the right lower quadrant.  A dominant pocket measures approximately 7.7 cm in greatest diameter. Aspiration at the level of this fluid collection yielded grossly purulent and foul-smelling fluid.  A drain was placed in the medial aspect of this abscess and was draining copious amounts of fluid after placement.  IMPRESSION: CT guided drainage of right lower quadrant peritoneal abscess with placement of 12-French drain.  A sample of purulent fluid was sent for culture analysis.   Original Report Authenticated By: Reola Calkins, M.D.     Anti-infectives: Anti-infectives     Start     Dose/Rate Route Frequency  Ordered Stop   09/22/12 0545   piperacillin-tazobactam (ZOSYN) IVPB 3.375 g        3.375 g 12.5 mL/hr over 240 Minutes Intravenous 3 times per day 09/22/12 0542     09/22/12 0430   ertapenem (INVANZ) 1 g in sodium chloride 0.9 % 50 mL IVPB        1 g 100 mL/hr over 30 Minutes Intravenous  Once 09/22/12 0416 09/22/12 0457          Assessment/Plan: Post operative abscess s/p percutaneous drainage  to right paracolic abscess 16/1/09.  Continue drain at this time, follow up for cultures. IR to follow.    LOS: 3 days    CAMPBELL,PAMELA D 09/24/2012

## 2012-09-25 LAB — CBC
HCT: 30.9 % — ABNORMAL LOW (ref 39.0–52.0)
Hemoglobin: 10.1 g/dL — ABNORMAL LOW (ref 13.0–17.0)
MCH: 29.8 pg (ref 26.0–34.0)
MCHC: 32.7 g/dL (ref 30.0–36.0)
MCV: 91.2 fL (ref 78.0–100.0)
Platelets: 411 10*3/uL — ABNORMAL HIGH (ref 150–400)
RBC: 3.39 MIL/uL — ABNORMAL LOW (ref 4.22–5.81)
RDW: 13.5 % (ref 11.5–15.5)
WBC: 12.5 10*3/uL — ABNORMAL HIGH (ref 4.0–10.5)

## 2012-09-25 LAB — PROTIME-INR
INR: 1.5 — ABNORMAL HIGH (ref 0.00–1.49)
Prothrombin Time: 17.7 seconds — ABNORMAL HIGH (ref 11.6–15.2)

## 2012-09-25 MED ORDER — WARFARIN SODIUM 10 MG PO TABS
10.0000 mg | ORAL_TABLET | Freq: Once | ORAL | Status: AC
  Administered 2012-09-25: 10 mg via ORAL
  Filled 2012-09-25: qty 1

## 2012-09-25 MED ORDER — VANCOMYCIN HCL 1000 MG IV SOLR
1500.0000 mg | Freq: Two times a day (BID) | INTRAVENOUS | Status: DC
  Administered 2012-09-25 – 2012-09-27 (×5): 1500 mg via INTRAVENOUS
  Filled 2012-09-25 (×6): qty 1500

## 2012-09-25 MED ORDER — VANCOMYCIN HCL 1000 MG IV SOLR
2000.0000 mg | Freq: Once | INTRAVENOUS | Status: AC
  Administered 2012-09-25: 2000 mg via INTRAVENOUS
  Filled 2012-09-25: qty 2000

## 2012-09-25 NOTE — Progress Notes (Signed)
Subjective: Pt c/o RLQ discomfort/cramping; intermittent nausea, no vomiting  Objective: Vital signs in last 24 hours: Temp:  [98 F (36.7 C)-98.4 F (36.9 C)] 98 F (36.7 C) (10/08 0615) Pulse Rate:  [70-87] 70  (10/08 0615) Resp:  [18-20] 20  (10/08 0615) BP: (113-144)/(67-76) 113/67 mmHg (10/08 0615) SpO2:  [92 %-97 %] 97 % (10/08 0615) Last BM Date: 09/22/12  Intake/Output from previous day: 10/07 0701 - 10/08 0700 In: 3911.7 [P.O.:1440; I.V.:2461.7] Out: 1810 [Urine:1750; Drains:60] Intake/Output this shift:    RLQ drain intact, output today 60 cc's cloudy reddish-beige fluid(possibly feculent); cx's pending(gram neg rods); drain flushed with 5 cc's sterile NS with return of same amt; no pericath leaking; there is associated erythema around lateral aspect of drain and extending towards flank (?cellulitis), site warm to touch  Lab Results:   Basename 09/25/12 0353 09/24/12 0400  WBC 12.5* 16.9*  HGB 10.1* 10.4*  HCT 30.9* 31.2*  PLT 411* 349   BMET  Basename 09/23/12 0422  NA --  K 3.7  CL --  CO2 --  GLUCOSE --  BUN --  CREATININE 0.77  CALCIUM --   PT/INR  Basename 09/25/12 0353 09/24/12 0400  LABPROT 17.7* 18.9*  INR 1.50* 1.64*   ABG No results found for this basename: PHART:2,PCO2:2,PO2:2,HCO3:2 in the last 72 hours  Studies/Results: Ct Guided Abscess Drain  09/23/2012  *RADIOLOGY REPORT*  Clinical Data: Status post appendectomy for acute appendicitis on 09/13/2012.  Postoperative inflammatory changes and probable abscess identified in the right lower quadrant on follow-up CT. The patient now presents for CT guided drainage.  CT GUIDED DRAINAGE OF PERITONEAL ABSCESS  Sedation:  3.0 mg IV Versed;  200 mcg IV Fentanyl  Total Moderate Sedation Time: 23 minutes.  Procedure:  The procedure, risks, benefits, and alternatives were explained to the patient.  Questions regarding the procedure were encouraged and answered. The patient understands and consents  to the procedure.  The right lower abdominal wall was prepped with Betadine in a sterile fashion, and a sterile drape was applied covering the operative field.  A sterile gown and sterile gloves were used for the procedure. Local anesthesia was provided with 1% Lidocaine.  CT was performed in a supine position.  After localizing a dominant pocket of fluid in the right lower quadrant, an 18 gauge needle was advanced into the peritoneal cavity.  Aspiration of fluid was performed and a sample sent for culture analysis.  A guidewire was advanced into the collection.  The tract was dilated and a 12- French pigtail drainage catheter placed.  Catheter position was confirmed by CT.  The catheter was flushed and connected to a suction bulb.  It was secured at the skin with a Prolene retention suture and adhesive Stat-Lock device.  Complications: None  Findings: Severe inflammatory changes and multiple pockets of fluid are again identified in the right lower quadrant.  A dominant pocket measures approximately 7.7 cm in greatest diameter. Aspiration at the level of this fluid collection yielded grossly purulent and foul-smelling fluid.  A drain was placed in the medial aspect of this abscess and was draining copious amounts of fluid after placement.  IMPRESSION: CT guided drainage of right lower quadrant peritoneal abscess with placement of 12-French drain.  A sample of purulent fluid was sent for culture analysis.   Original Report Authenticated By: Reola Calkins, M.D.    Results for orders placed during the hospital encounter of 09/21/12  CULTURE, ROUTINE-ABSCESS     Status: Normal (Preliminary  result)   Collection Time   09/23/12 11:22 AM      Component Value Range Status Comment   Specimen Description PERITONEAL CAVITY   Final    Special Requests Normal   Final    Gram Stain     Final    Value: ABUNDANT WBC PRESENT,BOTH PMN AND MONONUCLEAR     NO SQUAMOUS EPITHELIAL CELLS SEEN     ABUNDANT GRAM POSITIVE  COCCI IN CLUSTERS     IN PAIRS IN CHAINS   Culture MODERATE GRAM NEGATIVE RODS   Final    Report Status PENDING   Incomplete   ANAEROBIC CULTURE     Status: Normal (Preliminary result)   Collection Time   09/23/12 11:22 AM      Component Value Range Status Comment   Specimen Description PERITONEAL CAVITY   Final    Special Requests Normal   Final    Gram Stain     Final    Value: ABUNDANT WBC PRESENT,BOTH PMN AND MONONUCLEAR     NO SQUAMOUS EPITHELIAL CELLS SEEN     ABUNDANT GRAM POSITIVE COCCI IN CLUSTERS     IN PAIRS IN CHAINS   Culture     Final    Value: NO ANAEROBES ISOLATED; CULTURE IN PROGRESS FOR 5 DAYS   Report Status PENDING   Incomplete     Anti-infectives: Anti-infectives     Start     Dose/Rate Route Frequency Ordered Stop   09/22/12 0545   piperacillin-tazobactam (ZOSYN) IVPB 3.375 g        3.375 g 12.5 mL/hr over 240 Minutes Intravenous 3 times per day 09/22/12 0542     09/22/12 0430   ertapenem (INVANZ) 1 g in sodium chloride 0.9 % 50 mL IVPB        1 g 100 mL/hr over 30 Minutes Intravenous  Once 09/22/12 0416 09/22/12 0457          Assessment/Plan: s/p RLQ abscess drainage post appendectomy; check final cx's/sens; cont drain NS flushes; monitor labs; antbx per primary; check f/u CT and/or drain injection near end of week.   LOS: 4 days    Ashanna Heinsohn,D [facility] 09/25/2012

## 2012-09-25 NOTE — Progress Notes (Signed)
ANTICOAGULATION CONSULT NOTE  Pharmacy Consult for Coumadin Indication: Hx PE  No Known Allergies  Patient Measurements: Height: 5\' 10"  (177.8 cm) Weight: 334 lb (151.501 kg) (stated --weighed in last 48hr post surgery) IBW/kg (Calculated) : 73   Vital Signs: Temp: 98 F (36.7 C) (10/08 0615) Temp src: Oral (10/08 0615) BP: 113/67 mmHg (10/08 0615) Pulse Rate: 70  (10/08 0615)  Labs:  Basename 09/25/12 0353 09/24/12 0400 09/23/12 0422  HGB 10.1* 10.4* --  HCT 30.9* 31.2* 33.8*  PLT 411* 349 357  APTT -- -- --  LABPROT 17.7* 18.9* 21.7*  INR 1.50* 1.64* 1.98*  HEPARINUNFRC -- -- --  CREATININE -- -- 0.77  CKTOTAL -- -- --  CKMB -- -- --  TROPONINI -- -- --    Estimated Creatinine Clearance: 157.7 ml/min (by C-G formula based on Cr of 0.77).   Medical History: Past Medical History  Diagnosis Date  . OSA (obstructive sleep apnea)     dr Shelle Iron  . Testosterone deficiency     dr Patsi Sears, shots every 2 weeks  . ED (erectile dysfunction)   . GERD (gastroesophageal reflux disease)     [facility]  . Low back pain     dr Channing Mutters, dr Farris Has, dr Ethelene Hal, herniated disc L4-5  . PE (pulmonary embolism)     bilateral sep 2011 and again bilateral May 2012  . 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 Primary hypercoagulable state     no etiology found per Dr. Shirline Frees   . Hx of colonoscopy   . Pulmonary embolism 02/22/2010    CT angio Dx    Medications:  Scheduled:     . acetaminophen  1,000 mg Oral TID  . enoxaparin  150 mg Subcutaneous BID  . folic acid  1 mg Oral Daily  . lip balm  1 application Topical BID  . multivitamins with iron  1 tablet Oral Daily  . pantoprazole  80 mg Oral Q1200  . piperacillin-tazobactam (ZOSYN)  IV  3.375 g Intravenous Q8H  . psyllium  1 packet Oral BID  . vancomycin  1,500 mg Intravenous Q12H  . vancomycin  2,000 mg Intravenous Once  . warfarin  12.5 mg Oral ONCE-1800  . Warfarin - Pharmacist Dosing Inpatient   Does not apply q1800     Assessment:  56yo M w/ recent appendectomy, admitted with fever and flank pain. Peritoneal abscess discovered and drained on 10/6.   Resumed Coumadin 10/7 for hx PE. Bridging with Lovenox(MD dosing).  Home dose: 10mg  daily except 12.5mg  on MWF.  INR subtherapeutic but slowed down after first dose.  No bleeding reported/documented. Diet reduced to [place]. Broad-spectrum abx may increase sensitivity to Coumadin.  Goal of Therapy:  INR 2-3 Monitor platelets by anticoagulation protocol: Yes   Plan:   Coumadin 10mg  today.   Lovenox 150mg  SQ q12h until INR 2 or greater.  Daily PT/INR and CBC.  Charolotte Eke, PharmD, pager [phone]. 09/25/2012,10:49 AM.

## 2012-09-25 NOTE — Progress Notes (Signed)
Spoke to [employer], [place] informed her patient stating he is going to munch on some grapes and tomatoes that his mother is going to get him but PA states to inform him because of process with his drain they prefer he stay on clear liquids. Spoke to patient and he states understanding.

## 2012-09-25 NOTE — Progress Notes (Signed)
ANTIBIOTIC CONSULT NOTE - INITIAL  Pharmacy Consult for Vancomycin and continue Zosyn Indication: Appendicitis s/p lap appendectomy   No Known Allergies  Patient Measurements: Height: 5\' 10"  (177.8 cm) Weight: 334 lb (151.501 kg) (stated --weighed in last 48hr post surgery) IBW/kg (Calculated) : 73  Adjusted Body Weight: 97kg  Vital Signs: Temp: 98 F (36.7 C) (10/08 0615) Temp src: Oral (10/08 0615) BP: 113/67 mmHg (10/08 0615) Pulse Rate: 70  (10/08 0615) Intake/Output from previous day: 10/07 0701 - 10/08 0700 In: 3911.7 [P.O.:1440; I.V.:2461.7] Out: 1810 [Urine:1750; Drains:60] Intake/Output from this shift:    Labs:  Basename 09/25/12 0353 09/24/12 0400 09/23/12 0422  WBC 12.5* 16.9* 17.5*  HGB 10.1* 10.4* 11.1*  PLT 411* 349 357  LABCREA -- -- --  CREATININE -- -- 0.77   Estimated Creatinine Clearance: 157.7 ml/min (by C-G formula based on Cr of 0.77). No results found for this basename: VANCOTROUGH:2,VANCOPEAK:2,VANCORANDOM:2,GENTTROUGH:2,GENTPEAK:2,GENTRANDOM:2,TOBRATROUGH:2,TOBRAPEAK:2,TOBRARND:2,AMIKACINPEAK:2,AMIKACINTROU:2,AMIKACIN:2, in the last 72 hours   Microbiology: Recent Results (from the past 720 hour(s))  SURGICAL PCR SCREEN     Status: Abnormal   Collection Time   09/12/12  5:00 AM      Component Value Range Status Comment   MRSA, PCR NEGATIVE  NEGATIVE Final    Staphylococcus aureus POSITIVE (*) NEGATIVE Final   CULTURE, ROUTINE-ABSCESS     Status: Normal (Preliminary result)   Collection Time   09/23/12 11:22 AM      Component Value Range Status Comment   Specimen Description PERITONEAL CAVITY   Final    Special Requests Normal   Final    Gram Stain     Final    Value: ABUNDANT WBC PRESENT,BOTH PMN AND MONONUCLEAR     NO SQUAMOUS EPITHELIAL CELLS SEEN     ABUNDANT GRAM POSITIVE COCCI IN CLUSTERS     IN PAIRS IN CHAINS   Culture MODERATE GRAM NEGATIVE RODS   Final    Report Status PENDING   Incomplete   ANAEROBIC CULTURE     Status:  Normal (Preliminary result)   Collection Time   09/23/12 11:22 AM      Component Value Range Status Comment   Specimen Description PERITONEAL CAVITY   Final    Special Requests Normal   Final    Gram Stain     Final    Value: ABUNDANT WBC PRESENT,BOTH PMN AND MONONUCLEAR     NO SQUAMOUS EPITHELIAL CELLS SEEN     ABUNDANT GRAM POSITIVE COCCI IN CLUSTERS     IN PAIRS IN CHAINS   Culture     Final    Value: NO ANAEROBES ISOLATED; CULTURE IN PROGRESS FOR 5 DAYS   Report Status PENDING   Incomplete     Medical History: Past Medical History  Diagnosis Date  . OSA (obstructive sleep apnea)     dr Shelle Iron  . Testosterone deficiency     dr Patsi Sears, shots every 2 weeks  . ED (erectile dysfunction)   . GERD (gastroesophageal reflux disease)     [facility]  . Low back pain     dr Channing Mutters, dr Farris Has, dr Ethelene Hal, herniated disc L4-5  . PE (pulmonary embolism)     bilateral sep 2011 and again bilateral May 2012  . 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 Primary hypercoagulable state     no etiology found per Dr. Shirline Frees   . Hx of colonoscopy   . Pulmonary embolism 02/22/2010    CT angio Dx  Medications:  Anti-infectives     Start     Dose/Rate Route Frequency Ordered Stop   09/22/12 0545   piperacillin-tazobactam (ZOSYN) IVPB 3.375 g        3.375 g 12.5 mL/hr over 240 Minutes Intravenous 3 times per day 09/22/12 0542     09/22/12 0430   ertapenem (INVANZ) 1 g in sodium chloride 0.9 % 50 mL IVPB        1 g 100 mL/hr over 30 Minutes Intravenous  Once 09/22/12 0416 09/22/12 0457         Assessment:  55yo M admitted with fever and pain s/p lap appendectomy. Found to have RLQ abscess.  Day #4 Zosyn, now adding Vanc for worsening abdominal wall cellulitis.  Afebrile, WBCs elevated but improving.  SCr wnl on 10/6.  Obese (BMI~48)  Abscess fluid cx: moderate GNR.  Goal of Therapy:  Vancomycin trough 15-20mg /l. Zosyn based on renal function .  Plan:   Cont Zosyn 3.375g IV  Q8H infused over 4hrs.  Start Vanc 2g IV x 1 then 1500mg  IV q12h.  Measure Vanc trough at steady state.  Follow up renal fxn and culture results.  Charolotte Eke, PharmD, pager [phone]. 09/25/2012,10:41 AM.

## 2012-09-25 NOTE — Progress Notes (Signed)
Spoke to [employer], [place] this am aware of redness around drain site ang that patient on lovenox 150mg .

## 2012-09-25 NOTE — Progress Notes (Signed)
Patient ID: Gregory Cortez, male   DOB: [birth year], 56 y.o.   MRN: [id number]    Subjective: Pt feels less better today.  Some nausea last night.  Wife noted cellulitic changes of abdominal wall.  Had BM last night.  Still a little nauseated this morning.  Didn't eat breakfast.  Objective: Vital signs in last 24 hours: Temp:  [98 F (36.7 C)-98.4 F (36.9 C)] 98 F (36.7 C) (10/08 0615) Pulse Rate:  [70-87] 70  (10/08 0615) Resp:  [18-20] 20  (10/08 0615) BP: (113-144)/(67-76) 113/67 mmHg (10/08 0615) SpO2:  [92 %-97 %] 97 % (10/08 0615) Last BM Date: 09/22/12  Intake/Output from previous day: 10/07 0701 - 10/08 0700 In: 3911.7 [P.O.:1440; I.V.:2461.7] Out: 1810 [Urine:1750; Drains:60] Intake/Output this shift:    PE: Abd: soft, tender today, especially in thr RLQ, hypoactive BS, JP drain now with tan feculent appearing output.  Patient does have erythema and cellulitic changes with superficial edema of the right lateral abdominal wall lateral to his perc drain. Ht: regular Lungs: CTAB  Lab Results:   Basename 09/25/12 0353 09/24/12 0400  WBC 12.5* 16.9*  HGB 10.1* 10.4*  HCT 30.9* 31.2*  PLT 411* 349   BMET  Basename 09/23/12 0422  NA --  K 3.7  CL --  CO2 --  GLUCOSE --  BUN --  CREATININE 0.77  CALCIUM --   PT/INR  Basename 09/25/12 0353 09/24/12 0400  LABPROT 17.7* 18.9*  INR 1.50* 1.64*   CMP     Component Value Date/Time   NA 131* 09/21/2012 2345   K 3.7 09/23/2012 0422   CL 96 09/21/2012 2345   CO2 26 09/21/2012 2345   GLUCOSE 126* 09/21/2012 2345   BUN 8 09/21/2012 2345   CREATININE 0.77 09/23/2012 0422   CALCIUM 8.2* 09/21/2012 2345   PROT 6.5 09/21/2012 2345   ALBUMIN 2.3* 09/21/2012 2345   AST 20 09/21/2012 2345   ALT 24 09/21/2012 2345   ALKPHOS 117 09/21/2012 2345   BILITOT 0.6 09/21/2012 2345   GFRNONAA >90 09/23/2012 0422   GFRAA >90 09/23/2012 0422   Lipase  No results found for this basename: lipase       Studies/Results: Ct Guided  Abscess Drain  09/23/2012  *RADIOLOGY REPORT*  Clinical Data: Status post appendectomy for acute appendicitis on 09/13/2012.  Postoperative inflammatory changes and probable abscess identified in the right lower quadrant on follow-up CT. The patient now presents for CT guided drainage.  CT GUIDED DRAINAGE OF PERITONEAL ABSCESS  Sedation:  3.0 mg IV Versed;  200 mcg IV Fentanyl  Total Moderate Sedation Time: 23 minutes.  Procedure:  The procedure, risks, benefits, and alternatives were explained to the patient.  Questions regarding the procedure were encouraged and answered. The patient understands and consents to the procedure.  The right lower abdominal wall was prepped with Betadine in a sterile fashion, and a sterile drape was applied covering the operative field.  A sterile gown and sterile gloves were used for the procedure. Local anesthesia was provided with 1% Lidocaine.  CT was performed in a supine position.  After localizing a dominant pocket of fluid in the right lower quadrant, an 18 gauge needle was advanced into the peritoneal cavity.  Aspiration of fluid was performed and a sample sent for culture analysis.  A guidewire was advanced into the collection.  The tract was dilated and a 12- French pigtail drainage catheter placed.  Catheter position was confirmed by CT.  The catheter was flushed  and connected to a suction bulb.  It was secured at the skin with a Prolene retention suture and adhesive Stat-Lock device.  Complications: None  Findings: Severe inflammatory changes and multiple pockets of fluid are again identified in the right lower quadrant.  A dominant pocket measures approximately 7.7 cm in greatest diameter. Aspiration at the level of this fluid collection yielded grossly purulent and foul-smelling fluid.  A drain was placed in the medial aspect of this abscess and was draining copious amounts of fluid after placement.  IMPRESSION: CT guided drainage of right lower quadrant peritoneal  abscess with placement of 12-French drain.  A sample of purulent fluid was sent for culture analysis.   Original Report Authenticated By: Reola Calkins, M.D.     Anti-infectives: Anti-infectives     Start     Dose/Rate Route Frequency Ordered Stop   09/22/12 0545  piperacillin-tazobactam (ZOSYN) IVPB 3.375 g       3.375 g 12.5 mL/hr over 240 Minutes Intravenous 3 times per day 09/22/12 0542     09/22/12 0430   ertapenem (INVANZ) 1 g in sodium chloride 0.9 % 50 mL IVPB        1 g 100 mL/hr over 30 Minutes Intravenous  Once 09/22/12 0416 09/22/12 0457           Assessment/Plan  1. S/p perf appy with intra-abdominal abscess, suspect may have fistula 2. Abdominal wall cellulitis 3. H/o PE, on coumadin 4. Obesity  Plan: 1. Will vancomycin to zosyn today to see if this will help with cellulitis as zosyn alone is not sufficient as he developed this infection while on zosyn. 2. Patient is possibly developing a fistulous connection from his colon to his drain.  Will follow output from drain.  Will likely need drain study when we do repeat scan later this week. 3. Decrease to clear liquids per patient request as well as secondary to possible fistula. 4. Cont coumadin per pharmacy   LOS: 4 days    OSBORNE,KELLY E 09/25/2012, 10:13 AM Pager: [phone]  Saw pt with Tresa Endo.  Wife in room.  Wider area of redness around drain.  Agree with broadening antibx.  Discussed concerns about potential leak.  Ovidio Kin, MD, [facility] Surgery Pager: [phone] Office phone:  [phone]

## 2012-09-25 NOTE — Progress Notes (Addendum)
Called IV team informed them that patient hard stick and now pink and looking a bit puffy, but soft. although patient c/o no pain. States will come restart IV. Oncoming RN made aware

## 2012-09-26 ENCOUNTER — Inpatient Hospital Stay (HOSPITAL_COMMUNITY): Payer: BC Managed Care – PPO

## 2012-09-26 LAB — BASIC METABOLIC PANEL
BUN: 5 mg/dL — ABNORMAL LOW (ref 6–23)
CO2: 28 mEq/L (ref 19–32)
Calcium: 8.5 mg/dL (ref 8.4–10.5)
Chloride: 103 mEq/L (ref 96–112)
Creatinine, Ser: 0.68 mg/dL (ref 0.50–1.35)
GFR calc Af Amer: >90 mL/min (ref >90)
GFR calc non Af Amer: >90 mL/min (ref >90)
Glucose, Bld: 83 mg/dL (ref 70–99)
Potassium: 4 mEq/L (ref 3.5–5.1)
Sodium: 140 mEq/L (ref 135–145)

## 2012-09-26 LAB — CBC
HCT: 34.1 % — ABNORMAL LOW (ref 39.0–52.0)
Hemoglobin: 11 g/dL — ABNORMAL LOW (ref 13.0–17.0)
MCH: 29.5 pg (ref 26.0–34.0)
MCHC: 32.3 g/dL (ref 30.0–36.0)
MCV: 91.4 fL (ref 78.0–100.0)
Platelets: 479 10*3/uL — ABNORMAL HIGH (ref 150–400)
RBC: 3.73 MIL/uL — ABNORMAL LOW (ref 4.22–5.81)
RDW: 13.4 % (ref 11.5–15.5)
WBC: 9.3 10*3/uL (ref 4.0–10.5)

## 2012-09-26 LAB — CULTURE, ROUTINE-ABSCESS: Special Requests: NORMAL

## 2012-09-26 LAB — PROTIME-INR
INR: 1.75 — ABNORMAL HIGH (ref 0.00–1.49)
Prothrombin Time: 19.8 seconds — ABNORMAL HIGH (ref 11.6–15.2)

## 2012-09-26 LAB — CLOSTRIDIUM DIFFICILE BY PCR: Toxigenic C. Difficile by PCR: NEGATIVE

## 2012-09-26 MED ORDER — WARFARIN SODIUM 10 MG PO TABS
10.0000 mg | ORAL_TABLET | Freq: Once | ORAL | Status: DC
  Filled 2012-09-26: qty 1

## 2012-09-26 NOTE — Progress Notes (Signed)
ANTICOAGULATION CONSULT NOTE  Pharmacy Consult for Coumadin Indication: Hx PE  No Known Allergies  Patient Measurements: Height: 5\' 10"  (177.8 cm) Weight: 334 lb (151.501 kg) (stated --weighed in last 48hr post surgery) IBW/kg (Calculated) : 73   Vital Signs: Temp: 98.3 F (36.8 C) (10/09 0630) Temp src: Oral (10/09 0630) BP: 116/76 mmHg (10/09 0630) Pulse Rate: 76  (10/09 0630)  Labs:  Basename 09/26/12 0343 09/25/12 0353 09/24/12 0400  HGB 11.0* 10.1* --  HCT 34.1* 30.9* 31.2*  PLT 479* 411* 349  APTT -- -- --  LABPROT 19.8* 17.7* 18.9*  INR 1.75* 1.50* 1.64*  HEPARINUNFRC -- -- --  CREATININE 0.68 -- --  CKTOTAL -- -- --  CKMB -- -- --  TROPONINI -- -- --    Estimated Creatinine Clearance: 157.7 ml/min (by C-G formula based on Cr of 0.68).   Medical History: Past Medical History  Diagnosis Date  . OSA (obstructive sleep apnea)     dr Shelle Iron  . Testosterone deficiency     dr Patsi Sears, shots every 2 weeks  . ED (erectile dysfunction)   . GERD (gastroesophageal reflux disease)     [facility]  . Low back pain     dr Channing Mutters, dr Farris Has, dr Ethelene Hal, herniated disc L4-5  . PE (pulmonary embolism)     bilateral sep 2011 and again bilateral May 2012  . 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 Primary hypercoagulable state     no etiology found per Dr. Shirline Frees   . Hx of colonoscopy   . Pulmonary embolism 02/22/2010    CT angio Dx    Medications:  Scheduled:     . acetaminophen  1,000 mg Oral TID  . enoxaparin  150 mg Subcutaneous BID  . folic acid  1 mg Oral Daily  . lip balm  1 application Topical BID  . multivitamins with iron  1 tablet Oral Daily  . pantoprazole  80 mg Oral Q1200  . piperacillin-tazobactam (ZOSYN)  IV  3.375 g Intravenous Q8H  . psyllium  1 packet Oral BID  . vancomycin  1,500 mg Intravenous Q12H  . vancomycin  2,000 mg Intravenous Once  . warfarin  10 mg Oral ONCE-1800  . Warfarin - Pharmacist Dosing Inpatient   Does not apply q1800      Assessment:  56yo M w/ recent appendectomy, admitted with fever and flank pain. Peritoneal abscess discovered and drained on 10/6.   Resumed Coumadin 10/7 for hx PE. Bridging with Lovenox(MD dosing).  Home dose: 10mg  daily except 12.5mg  on MWF.  INR rising again but still subtherapeutic.  CBC stable. No bleeding reported/documented. Diet downgraded to CL d/t nausea and bloating. Broad-spectrum abx may increase sensitivity to Coumadin.  Goal of Therapy:  INR 2-3 Monitor platelets by anticoagulation protocol: Yes   Plan:   Coumadin 10mg  today.   Lovenox 150mg  SQ q12h until INR 2 or greater.  Daily PT/INR and CBC.  Charolotte Eke, PharmD, pager [phone]. 09/26/2012,11:13 AM.

## 2012-09-26 NOTE — Progress Notes (Signed)
  Subjective: Pt feeling ok now; had abd pain earlier but improved after eating small amts  Objective: Vital signs in last 24 hours: Temp:  [98.3 F (36.8 C)-99 F (37.2 C)] 98.3 F (36.8 C) Oct 10, 2023 0630) Pulse Rate:  [76-87] 76  October 10, 2023 0630) Resp:  [20] 20  October 10, 2023 0630) BP: (108-116)/(70-76) 116/76 mmHg 10/10/2023 0630) SpO2:  [94 %-95 %] 94 % Oct 10, 2023 0630) Last BM Date: 2012/10/09  Intake/Output from previous day: 10/08 0701 - 10-10-23 0700 In: 4060 [P.O.:1440; I.V.:2400; IV Piggyback:200] Out: 2150 [Urine:2050; Drains:100] Intake/Output this shift: Total I/O In: -  Out: 742 [Urine:700; Drains:40; Stool:2]  RLQ drain intact, output 40 cc's today, cx's e coli pan sens, right abd wall cellulitis appearance similar to yesterday, c diff neg  Lab Results:   [facility] 2012/10/09 0343 09/25/12 0353  WBC 9.3 12.5*  HGB 11.0* 10.1*  HCT 34.1* 30.9*  PLT 479* 411*   BMET  Basename 2012-10-09 0343  NA 140  K 4.0  CL 103  CO2 28  GLUCOSE 83  BUN 5*  CREATININE 0.68  CALCIUM 8.5   PT/INR  Basename 2012-10-09 0343 09/25/12 0353  LABPROT 19.8* 17.7*  INR 1.75* 1.50*   ABG No results found for this basename: PHART:2,PCO2:2,PO2:2,HCO3:2 in the last 72 hours  Studies/Results: Dg Abd 2 Views  2012-10-09  *RADIOLOGY REPORT*  Clinical Data: Abdominal pain.  Status post percutaneous drain placement for abscess.  ABDOMEN - 2 VIEW  Comparison: Abdominal CT scan 09/22/2012.  Findings: Multiple supine views of the abdomen demonstrate a paucity of bowel gas.  There is a small amount of distal colonic and trace amount of rectal gas noted.  No definite pathologic distension of small bowel is identified.  No gross pneumoperitoneum.  A percutaneous drainage catheter is seen projecting over the right side of the abdomen.  IMPRESSION: 1.  Nonspecific, nonobstructive bowel gas pattern. 2.  No pneumoperitoneum. 3.  Percutaneous drainage catheter in place projecting over the right side of the abdomen.    Original Report Authenticated By: Florencia Reasons, M.D.     Anti-infectives: Anti-infectives     Start     Dose/Rate Route Frequency Ordered Stop   09/25/12 2200   vancomycin (VANCOCIN) 1,500 mg in sodium chloride 0.9 % 500 mL IVPB        1,500 mg 250 mL/hr over 120 Minutes Intravenous Every 12 hours 09/25/12 1044     09/25/12 1200   vancomycin (VANCOCIN) 2,000 mg in sodium chloride 0.9 % 500 mL IVPB        2,000 mg 250 mL/hr over 120 Minutes Intravenous  Once 09/25/12 1044 09/25/12 1359   09/22/12 0545   piperacillin-tazobactam (ZOSYN) IVPB 3.375 g        3.375 g 12.5 mL/hr over 240 Minutes Intravenous 3 times per day 09/22/12 0542     09/22/12 0430   ertapenem (INVANZ) 1 g in sodium chloride 0.9 % 50 mL IVPB        1 g 100 mL/hr over 30 Minutes Intravenous  Once 09/22/12 0416 09/22/12 0457          Assessment/Plan: s/p RLQ abscess drainage 10/6; plans as per CCS; check f/u CT with drain injection near end of week  LOS: 5 days    Rhylee Nunn,D [facility] 2012/10/09

## 2012-09-26 NOTE — Progress Notes (Signed)
Dr Ezzard Standing was notified.

## 2012-09-26 NOTE — Progress Notes (Signed)
Patient reported experiencing chest pain after walking from the bathroom to his chair. Vital signs taken ant 0630 and recorded. O2 applied. EKG noted with normal sinus rhythm.  Patient reported feeling nauseous and he was medicated with good relief. Call placed to on call twice.  Awaiting for return call.

## 2012-09-26 NOTE — Care Management Note (Signed)
    Page 1 of 1   10/01/2012     1:19:27 PM   CARE MANAGEMENT NOTE 10/01/2012  Patient:  Gregory Cortez, Gregory Cortez   Account Number:  [id number]  Date Initiated:  09/26/2012  Documentation initiated by:  Lorenda Ishihara  Subjective/Objective Assessment:   56 yo male admitted with abscess s/p lap appy on 9/26. PTA lived at home with spouse.     Action/Plan:   Anticipated DC Date:  10/01/2012   Anticipated DC Plan:  HOME/SELF CARE      DC Planning Services  CM consult      Choice offered to / List presented to:             Status of service:  Completed, signed off Medicare Important Message given?   (If response is "NO", the following Medicare IM given date fields will be blank) Date Medicare IM given:   Date Additional Medicare IM given:    Discharge Disposition:  HOME/SELF CARE  Per UR Regulation:  Reviewed for med. necessity/level of care/duration of stay  If discussed at Long Length of Stay Meetings, dates discussed:    Comments:

## 2012-09-26 NOTE — Progress Notes (Signed)
Patient ID: Gregory Cortez, male   DOB: [birth year], 56 y.o.   MRN: [id number]    Subjective: Pt c/o increasing abdominal pain starting last night into today.  He had some epigastric pain and "chest tightening" last night, but this is gone today.  He has started having multiple BMs which are semi-solid but have an odd odor to them.  He feels more bloated today and more sick on his stomach.  Didn't eat breakfast today. Culture, pansensitive E. coli  Objective: Vital signs in last 24 hours: Temp:  [98.3 F (36.8 C)-99 F (37.2 C)] 98.3 F (36.8 C) (10/09 0630) Pulse Rate:  [69-87] 76  (10/09 0630) Resp:  [18-20] 20  (10/09 0630) BP: (108-133)/(70-83) 116/76 mmHg (10/09 0630) SpO2:  [93 %-95 %] 94 % (10/09 0630) Last BM Date: 09/26/12  Intake/Output from previous day: 10/08 0701 - 10/09 0700 In: 4060 [P.O.:1440; I.V.:2400; IV Piggyback:200] Out: 2150 [Urine:2050; Drains:100] Intake/Output this shift: Total I/O In: -  Out: 442 [Urine:400; Drains:40; Stool:2]  PE: Abd: soft, hypoactive BS, obese, increasing pain in epigastrium and right lower quadrant.  He can pinpoint an area of pain just superior to his drain.  Cellulitis appears unchanged so far.  JP appears more purulent in nature today Heart: regular Lungs: CTAB  Lab Results:   Basename 09/26/12 0343 09/25/12 0353  WBC 9.3 12.5*  HGB 11.0* 10.1*  HCT 34.1* 30.9*  PLT 479* 411*   BMET  Basename 09/26/12 0343  NA 140  K 4.0  CL 103  CO2 28  GLUCOSE 83  BUN 5*  CREATININE 0.68  CALCIUM 8.5   PT/INR  Basename 09/26/12 0343 09/25/12 0353  LABPROT 19.8* 17.7*  INR 1.75* 1.50*   CMP     Component Value Date/Time   NA 140 09/26/2012 0343   K 4.0 09/26/2012 0343   CL 103 09/26/2012 0343   CO2 28 09/26/2012 0343   GLUCOSE 83 09/26/2012 0343   BUN 5* 09/26/2012 0343   CREATININE 0.68 09/26/2012 0343   CALCIUM 8.5 09/26/2012 0343   PROT 6.5 09/21/2012 2345   ALBUMIN 2.3* 09/21/2012 2345   AST 20 09/21/2012 2345   ALT 24  09/21/2012 2345   ALKPHOS 117 09/21/2012 2345   BILITOT 0.6 09/21/2012 2345   GFRNONAA >90 09/26/2012 0343   GFRAA >90 09/26/2012 0343   Lipase  No results found for this basename: lipase       Studies/Results: No results found.  Anti-infectives: Anti-infectives     Start     Dose/Rate Route Frequency Ordered Stop   09/25/12 2200   vancomycin (VANCOCIN) 1,500 mg in sodium chloride 0.9 % 500 mL IVPB        1,500 mg 250 mL/hr over 120 Minutes Intravenous Every 12 hours 09/25/12 1044     09/25/12 1200   vancomycin (VANCOCIN) 2,000 mg in sodium chloride 0.9 % 500 mL IVPB        2,000 mg 250 mL/hr over 120 Minutes Intravenous  Once 09/25/12 1044 09/25/12 1359   09/22/12 0545  piperacillin-tazobactam (ZOSYN) IVPB 3.375 g       3.375 g 12.5 mL/hr over 240 Minutes Intravenous 3 times per day 09/22/12 0542     09/22/12 0430   ertapenem (INVANZ) 1 g in sodium chloride 0.9 % 50 mL IVPB        1 g 100 mL/hr over 30 Minutes Intravenous  Once 09/22/12 0416 09/22/12 0457           Assessment/Plan  1. S/p lap appy for perf appy 2. Post op abscess, s/p perc drain  3. DVT prophylaxis, Lovenox  History of PE - being re-anticoagluted  Because of concern about abdominal wall cellulitis (that could require surgery) - I am going to stop the coumadin for now and continue only Lovenox, since it is easier to reverse. 4. Abdominal wall cellulitis  Plan: 1. Will get a plan abdominal x-ray today given new complaints, to rule out anything gross. 2. Cont zosyn and vanc for infection, but also for significant abdominal wall cellulitis. 3. Cont on clears, but if conts with nausea or emesis, will make NPO 4. Check stools for c. Diff.  Patient has plenty of risk factors for this.  LOS: 5 days    OSBORNE,KELLY E 09/26/2012, 10:47 AM Pager: [phone]  KUB shows the drain appears to be in the correct location without any obvious abnormality.  KUB limited because of obesity.  WBC better, cellulitis of  right flank about the same, but  the patient complaining of more pain. So a mixed bag regarding the abscess. C. Diff to be checked because of loose stools. Hold coumadin. Discusses with patient and wife.  Ovidio Kin, MD, [facility] Surgery Pager: [phone] Office phone:  [phone]

## 2012-09-27 LAB — CBC
HCT: 32.4 % — ABNORMAL LOW (ref 39.0–52.0)
Hemoglobin: 10.5 g/dL — ABNORMAL LOW (ref 13.0–17.0)
MCH: 29.8 pg (ref 26.0–34.0)
MCHC: 32.4 g/dL (ref 30.0–36.0)
MCV: 92 fL (ref 78.0–100.0)
Platelets: 556 10*3/uL — ABNORMAL HIGH (ref 150–400)
RBC: 3.52 MIL/uL — ABNORMAL LOW (ref 4.22–5.81)
RDW: 13.7 % (ref 11.5–15.5)
WBC: 8.3 10*3/uL (ref 4.0–10.5)

## 2012-09-27 LAB — VANCOMYCIN, TROUGH: Vancomycin Tr: 8 ug/mL — ABNORMAL LOW (ref 10.0–20.0)

## 2012-09-27 LAB — PROTIME-INR
INR: 1.9 — ABNORMAL HIGH (ref 0.00–1.49)
Prothrombin Time: 21.1 seconds — ABNORMAL HIGH (ref 11.6–15.2)

## 2012-09-27 MED ORDER — VANCOMYCIN HCL 1000 MG IV SOLR
1750.0000 mg | Freq: Two times a day (BID) | INTRAVENOUS | Status: DC
  Administered 2012-09-28 – 2012-09-29 (×4): 1750 mg via INTRAVENOUS
  Filled 2012-09-27 (×5): qty 1750

## 2012-09-27 MED ORDER — WARFARIN SODIUM 10 MG PO TABS
10.0000 mg | ORAL_TABLET | Freq: Once | ORAL | Status: DC
  Filled 2012-09-27: qty 1

## 2012-09-27 NOTE — Progress Notes (Signed)
  Subjective: Pt doing ok; tolerating diet better; c/o scrotal edema  Objective: Vital signs in last 24 hours: Temp:  [97.9 F (36.6 C)-98.8 F (37.1 C)] 97.9 F (36.6 C) (10/10 0830) Pulse Rate:  [69-82] 69  (10/10 0830) Resp:  [18] 18  (10/10 0830) BP: (111-135)/(67-86) 135/86 mmHg (10/10 0830) SpO2:  [94 %-96 %] 96 % (10/10 0830) Last BM Date: 09/27/12  Intake/Output from previous day: 28-Sep-2023 0701 - 10/10 0700 In: 2530 [P.O.:1320; I.V.:1200] Out: 1177 [Urine:1050; Drains:125; Stool:2] Intake/Output this shift:    RLQ drain intact, surrounding right abd wall cellulitis about the same; drain output 125 cc's; drain flushed with 5 cc's sterile NS with return of same amt  Lab Results:   Basename 09/27/12 0345 09-27-12 0343  WBC 8.3 9.3  HGB 10.5* 11.0*  HCT 32.4* 34.1*  PLT 556* 479*   BMET  Basename 2012/09/27 0343  NA 140  K 4.0  CL 103  CO2 28  GLUCOSE 83  BUN 5*  CREATININE 0.68  CALCIUM 8.5   PT/INR  Basename 09/27/12 0345 September 27, 2012 0343  LABPROT 21.1* 19.8*  INR 1.90* 1.75*   ABG No results found for this basename: PHART:2,PCO2:2,PO2:2,HCO3:2 in the last 72 hours  Studies/Results: Dg Abd 2 Views  Sep 27, 2012  *RADIOLOGY REPORT*  Clinical Data: Abdominal pain.  Status post percutaneous drain placement for abscess.  ABDOMEN - 2 VIEW  Comparison: Abdominal CT scan 09/22/2012.  Findings: Multiple supine views of the abdomen demonstrate a paucity of bowel gas.  There is a small amount of distal colonic and trace amount of rectal gas noted.  No definite pathologic distension of small bowel is identified.  No gross pneumoperitoneum.  A percutaneous drainage catheter is seen projecting over the right side of the abdomen.  IMPRESSION: 1.  Nonspecific, nonobstructive bowel gas pattern. 2.  No pneumoperitoneum. 3.  Percutaneous drainage catheter in place projecting over the right side of the abdomen.   Original Report Authenticated By: Florencia Reasons, M.D.      Anti-infectives: Anti-infectives     Start     Dose/Rate Route Frequency Ordered Stop   09/25/12 2200   vancomycin (VANCOCIN) 1,500 mg in sodium chloride 0.9 % 500 mL IVPB        1,500 mg 250 mL/hr over 120 Minutes Intravenous Every 12 hours 09/25/12 1044     09/25/12 1200   vancomycin (VANCOCIN) 2,000 mg in sodium chloride 0.9 % 500 mL IVPB        2,000 mg 250 mL/hr over 120 Minutes Intravenous  Once 09/25/12 1044 09/25/12 1359   09/22/12 0545   piperacillin-tazobactam (ZOSYN) IVPB 3.375 g        3.375 g 12.5 mL/hr over 240 Minutes Intravenous 3 times per day 09/22/12 0542     09/22/12 0430   ertapenem (INVANZ) 1 g in sodium chloride 0.9 % 50 mL IVPB        1 g 100 mL/hr over 30 Minutes Intravenous  Once 09/22/12 0416 09/22/12 0457          Assessment/Plan: s/p RLQ abscess drainage 10/6; cont current tx as per CCS; check f/u CT with drain injection later this week  LOS: 6 days    ALLRED,D [facility] 09/27/2012

## 2012-09-27 NOTE — Progress Notes (Signed)
ANTIBIOTIC CONSULT NOTE - f/u   Pharmacy Consult for Vancomycin Indication: Appendicitis s/p lap appendectomy/ RLQ abscess  No Known Allergies  Patient Measurements: Height: 5\' 10"  (177.8 cm) Weight: 334 lb (151.501 kg) (stated --weighed in last 48hr post surgery) IBW/kg (Calculated) : 73  Adjusted Body Weight: 97kg  Vital Signs: Temp: 97.8 F (36.6 C) (10/10 2155) Temp src: Oral (10/10 2155) BP: 143/89 mmHg (10/10 2155) Pulse Rate: 74  (10/10 2155) Intake/Output from previous day: 10/09 0701 - 10/10 0700 In: 2530 [P.O.:1320; I.V.:1200] Out: 1177 [Urine:1050; Drains:125; Stool:2] Intake/Output from this shift: Total I/O In: -  Out: 15 [Drains:15]  Labs:  [facility] 09/27/12 0345 09/26/12 0343 09/25/12 0353  WBC 8.3 9.3 12.5*  HGB 10.5* 11.0* 10.1*  PLT 556* 479* 411*  LABCREA -- -- --  CREATININE -- 0.68 --   Estimated Creatinine Clearance: 157.7 ml/min (by C-G formula based on Cr of 0.68).  Basename 09/27/12 2046  VANCOTROUGH 8.0*  VANCOPEAK --  Drue Dun --  GENTTROUGH --  GENTPEAK --  GENTRANDOM --  TOBRATROUGH --  TOBRAPEAK --  TOBRARND --  AMIKACINPEAK --  AMIKACINTROU --  AMIKACIN --     Microbiology: Recent Results (from the past 720 hour(s))  SURGICAL PCR SCREEN     Status: Abnormal   Collection Time   09/12/12  5:00 AM      Component Value Range Status Comment   MRSA, PCR NEGATIVE  NEGATIVE Final    Staphylococcus aureus POSITIVE (*) NEGATIVE Final   CULTURE, ROUTINE-ABSCESS     Status: Normal   Collection Time   09/23/12 11:22 AM      Component Value Range Status Comment   Specimen Description PERITONEAL CAVITY   Final    Special Requests Normal   Final    Gram Stain     Final    Value: ABUNDANT WBC PRESENT,BOTH PMN AND MONONUCLEAR     NO SQUAMOUS EPITHELIAL CELLS SEEN     ABUNDANT GRAM POSITIVE COCCI IN CLUSTERS     IN PAIRS IN CHAINS   Culture MODERATE ESCHERICHIA COLI   Final    Report Status 09/26/2012 FINAL   Final    Organism  ID, Bacteria ESCHERICHIA COLI   Final   ANAEROBIC CULTURE     Status: Normal (Preliminary result)   Collection Time   09/23/12 11:22 AM      Component Value Range Status Comment   Specimen Description PERITONEAL CAVITY   Final    Special Requests Normal   Final    Gram Stain     Final    Value: ABUNDANT WBC PRESENT,BOTH PMN AND MONONUCLEAR     NO SQUAMOUS EPITHELIAL CELLS SEEN     ABUNDANT GRAM POSITIVE COCCI IN CLUSTERS     IN PAIRS IN CHAINS   Culture     Final    Value: NO ANAEROBES ISOLATED; CULTURE IN PROGRESS FOR 5 DAYS   Report Status PENDING   Incomplete   CLOSTRIDIUM DIFFICILE BY PCR     Status: Normal   Collection Time   09/26/12 10:48 AM      Component Value Range Status Comment   C difficile by pcr NEGATIVE  NEGATIVE Final     Medical History: Past Medical History  Diagnosis Date  . OSA (obstructive sleep apnea)     dr Shelle Iron  . Testosterone deficiency     dr Patsi Sears, shots every 2 weeks  . ED (erectile dysfunction)   . GERD (gastroesophageal reflux disease)  [facility]  . Low back pain     dr Channing Mutters, dr Farris Has, dr Ethelene Hal, herniated disc L4-5  . PE (pulmonary embolism)     bilateral sep 2011 and again bilateral May 2012  . 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 Primary hypercoagulable state     no etiology found per Dr. Shirline Frees   . Hx of colonoscopy   . Pulmonary embolism 02/22/2010    CT angio Dx    Medications:  Anti-infectives     Start     Dose/Rate Route Frequency Ordered Stop   09/25/12 2200   vancomycin (VANCOCIN) 1,500 mg in sodium chloride 0.9 % 500 mL IVPB        1,500 mg 250 mL/hr over 120 Minutes Intravenous Every 12 hours 09/25/12 1044     09/25/12 1200   vancomycin (VANCOCIN) 2,000 mg in sodium chloride 0.9 % 500 mL IVPB        2,000 mg 250 mL/hr over 120 Minutes Intravenous  Once 09/25/12 1044 09/25/12 1359   09/22/12 0545   piperacillin-tazobactam (ZOSYN) IVPB 3.375 g        3.375 g 12.5 mL/hr over 240 Minutes Intravenous 3 times  per day 09/22/12 0542     09/22/12 0430   ertapenem (INVANZ) 1 g in sodium chloride 0.9 % 50 mL IVPB        1 g 100 mL/hr over 30 Minutes Intravenous  Once 09/22/12 0416 09/22/12 0457         Assessment:  55yo M admitted with fever and pain s/p lap appendectomy. Found to have RLQ abscess.  Afebrile, WBCs elevated but improving.  SCr wnl on 10/6.  Obese (BMI~48)  Abscess fluid cx: moderate GNR.  VT = 8 mg/L after 1500 q12h @ [facility].  Goal of Therapy:  Vancomycin trough 15-20mg /l.   Plan:   Incrase Vancomycin to 1750mg  IV q12h.   Measure Vanc trough at steady state.  Follow up renal fxn and culture results.  Lorenza Evangelist 09/27/2012 11:13 PM

## 2012-09-27 NOTE — Progress Notes (Signed)
Patient ID: Gregory Cortez, male   DOB: 1956-11-03, 56 y.o.   MRN: [id number]    Subjective: Pt feels ok, but still having some pain.  A little better than yesterday.  C/o scrotal and penile edema  Objective: Vital signs in last 24 hours: Temp:  [97.9 F (36.6 C)-98.8 F (37.1 C)] 97.9 F (36.6 C) (10/10 0830) Pulse Rate:  [69-82] 69  (10/10 0830) Resp:  [18] 18  (10/10 0830) BP: (111-135)/(67-86) 135/86 mmHg (10/10 0830) SpO2:  [94 %-96 %] 96 % (10/10 0830) Last BM Date: 09/27/12  Intake/Output from previous day: 10-10-23 0701 - 10/10 0700 In: 2530 [P.O.:1320; I.V.:1200] Out: 1177 [Urine:1050; Drains:125; Stool:2] Intake/Output this shift: Total I/O In: 120 [P.O.:120] Out: 15 [Drains:15]  PE: Abd: soft, still tender around the drain and in cellulitic area.  Cellulitis is improved with decreased intensity in erythema.  Drain with purulent appearing output.  No feculent drainage noted. Ht: regular Lungs: CTAB  Lab Results:   Basename 09/27/12 0345 10/09/2012 0343  WBC 8.3 9.3  HGB 10.5* 11.0*  HCT 32.4* 34.1*  PLT 556* 479*   BMET  Basename 10-09-12 0343  NA 140  K 4.0  CL 103  CO2 28  GLUCOSE 83  BUN 5*  CREATININE 0.68  CALCIUM 8.5   PT/INR  Basename 09/27/12 0345 2012/10/09 0343  LABPROT 21.1* 19.8*  INR 1.90* 1.75*   CMP     Component Value Date/Time   NA 140 Oct 09, 2012 0343   K 4.0 10/09/12 0343   CL 103 Oct 09, 2012 0343   CO2 28 2012/10/09 0343   GLUCOSE 83 2012/10/09 0343   BUN 5* 10-09-12 0343   CREATININE 0.68 2012/10/09 0343   CALCIUM 8.5 10-09-2012 0343   PROT 6.5 09/21/2012 2345   ALBUMIN 2.3* 09/21/2012 2345   AST 20 09/21/2012 2345   ALT 24 09/21/2012 2345   ALKPHOS 117 09/21/2012 2345   BILITOT 0.6 09/21/2012 2345   GFRNONAA >90 10/09/12 0343   GFRAA >90 10-09-2012 0343   Lipase  No results found for this basename: lipase   Studies/Results: Dg Abd 2 Views  2012-10-09  *RADIOLOGY REPORT*  Clinical Data: Abdominal pain.  Status post  percutaneous drain placement for abscess.  ABDOMEN - 2 VIEW  Comparison: Abdominal CT scan 09/22/2012.  Findings: Multiple supine views of the abdomen demonstrate a paucity of bowel gas.  There is a small amount of distal colonic and trace amount of rectal gas noted.  No definite pathologic distension of small bowel is identified.  No gross pneumoperitoneum.  A percutaneous drainage catheter is seen projecting over the right side of the abdomen.  IMPRESSION: 1.  Nonspecific, nonobstructive bowel gas pattern. 2.  No pneumoperitoneum. 3.  Percutaneous drainage catheter in place projecting over the right side of the abdomen.   Original Report Authenticated By: Florencia Reasons, M.D.     Anti-infectives: Anti-infectives     Start     Dose/Rate Route Frequency Ordered Stop   09/25/12 2200   vancomycin (VANCOCIN) 1,500 mg in sodium chloride 0.9 % 500 mL IVPB        1,500 mg 250 mL/hr over 120 Minutes Intravenous Every 12 hours 09/25/12 1044     09/25/12 1200   vancomycin (VANCOCIN) 2,000 mg in sodium chloride 0.9 % 500 mL IVPB        2,000 mg 250 mL/hr over 120 Minutes Intravenous  Once 09/25/12 1044 09/25/12 1359   09/22/12 0545  piperacillin-tazobactam (ZOSYN) IVPB 3.375 g  3.375 g 12.5 mL/hr over 240 Minutes Intravenous 3 times per day 09/22/12 0542     09/22/12 0430   ertapenem (INVANZ) 1 g in sodium chloride 0.9 % 50 mL IVPB        1 g 100 mL/hr over 30 Minutes Intravenous  Once 09/22/12 0416 09/22/12 0457           Assessment/Plan  1. S/p lap appy for perf appy  F. Byerly - 09/13/2012  2. Post op abscess  perc drain - 09/23/2012  3. Abdominal wall cellulitis 4, h/o PE  Anticoagulation with Lovenox for now.  Coumadin being held, but INR has drifted up - PT/INR - 21.1/1.90 5. OSA  6. Morbid obesity   He lost 40+ pounds recently and won a contest at his work. 7.  Diarrhea  C. Diff - negative  Off isolation 8.  Some scrotal/penile edema  Plan: 1. Cont IV abx for  infection and cellulitis 2. Cont regular diet as pt tolerating this well 3. Will need repeat CT scan within the next 1-2 days. 4. Cont drain, doubt fistula at this time, but may still need a drain injection at some point 5. Elevate scrotum   LOS: 6 days    OSBORNE,KELLY E 09/27/2012, 11:22 AM Pager: [phone]  Has less tenderness in right flank.  Overall seems to feel better.  Tolerating reg food.  WBC - 8,300 - 09/27/2012.  Wife is not in room tonight, but she may be preparing for a baby shower for his daughter.  Ovidio Kin, MD, [facility] Pager: [phone] Office phone:  [phone]

## 2012-09-27 NOTE — Progress Notes (Addendum)
ANTICOAGULATION CONSULT NOTE  Pharmacy Consult for Coumadin Indication: Hx PE  No Known Allergies  Patient Measurements: Height: 5\' 10"  (177.8 cm) Weight: 334 lb (151.501 kg) (stated --weighed in last 48hr post surgery) IBW/kg (Calculated) : 73   Vital Signs: Temp: 97.9 F (36.6 C) (10/10 0830) Temp src: Oral (10/10 0830) BP: 135/86 mmHg (10/10 0830) Pulse Rate: 69  (10/10 0830)  Labs:  Basename 09/27/12 0345 09/26/12 0343 09/25/12 0353  HGB 10.5* 11.0* --  HCT 32.4* 34.1* 30.9*  PLT 556* 479* 411*  APTT -- -- --  LABPROT 21.1* 19.8* 17.7*  INR 1.90* 1.75* 1.50*  HEPARINUNFRC -- -- --  CREATININE -- 0.68 --  CKTOTAL -- -- --  CKMB -- -- --  TROPONINI -- -- --    Estimated Creatinine Clearance: 157.7 ml/min (by C-G formula based on Cr of 0.68).   Medical History: Past Medical History  Diagnosis Date  . OSA (obstructive sleep apnea)     dr Shelle Iron  . Testosterone deficiency     dr Patsi Sears, shots every 2 weeks  . ED (erectile dysfunction)   . GERD (gastroesophageal reflux disease)     [facility]  . Low back pain     dr Channing Mutters, dr Farris Has, dr Ethelene Hal, herniated disc L4-5  . PE (pulmonary embolism)     bilateral sep 2011 and again bilateral May 2012  . 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 Primary hypercoagulable state     no etiology found per Dr. Shirline Frees   . Hx of colonoscopy   . Pulmonary embolism 02/22/2010    CT angio Dx    Medications:  Scheduled:     . acetaminophen  1,000 mg Oral TID  . enoxaparin  150 mg Subcutaneous BID  . folic acid  1 mg Oral Daily  . lip balm  1 application Topical BID  . multivitamins with iron  1 tablet Oral Daily  . pantoprazole  80 mg Oral Q1200  . piperacillin-tazobactam (ZOSYN)  IV  3.375 g Intravenous Q8H  . psyllium  1 packet Oral BID  . vancomycin  1,500 mg Intravenous Q12H  . Warfarin - Pharmacist Dosing Inpatient   Does not apply q1800  . DISCONTD: warfarin  10 mg Oral ONCE-1800    Assessment:  55yo M w/  recent appendectomy, admitted with fever and flank pain. Peritoneal abscess discovered and drained on 10/6.   Resumed Coumadin 10/7 for hx PE. Bridging with Lovenox(MD dosing).  Home dose: 10mg  daily except 12.5mg  on MWF.  INR rising again but still subtherapeutic.  CBC stable. No bleeding reported/documented. Broad-spectrum abx may increase sensitivity to Coumadin.  Goal of Therapy:  INR 2-3 Monitor platelets by anticoagulation protocol: Yes   Plan:   Coumadin 10mg  today.   Lovenox 150mg  SQ q12h until INR 2 or greater.  Daily PT/INR and CBC.  Darrol Angel, PharmD Pager: [phone]  09/27/2012,10:22 AM.   ADDENDUM: Was unaware that [facility] MD desired to hold warfarin when writing and dosing warfarin (based on 10/9am [facility] note). Will d/c "Warfarin per Pharmacy" consult so this mistake isn't made tomorrow.  New Plan: 1) D/C warfarin order for today (none given yet) 2) D/C warfarin per pharmacy consult 3) Please reorder "Warfarin per Pharmacy" consult when ok to resume dosing. 4) Will continue daily INRs for now - MD to d/c them if not needed  Darrol Angel, PharmD Pager: [phone] 09/27/2012 10:34 AM

## 2012-09-28 LAB — PROTIME-INR
INR: 1.72 — ABNORMAL HIGH (ref 0.00–1.49)
Prothrombin Time: 19.6 seconds — ABNORMAL HIGH (ref 11.6–15.2)

## 2012-09-28 LAB — ANAEROBIC CULTURE: Special Requests: NORMAL

## 2012-09-28 MED ORDER — TOLNAFTATE 1 % EX POWD
Freq: Two times a day (BID) | CUTANEOUS | Status: DC
  Administered 2012-09-28: 22:00:00 via TOPICAL
  Administered 2012-09-28 – 2012-09-29 (×2): 1 via TOPICAL
  Administered 2012-09-29 – 2012-09-30 (×2): via TOPICAL
  Administered 2012-09-30: 1 via TOPICAL
  Filled 2012-09-28: qty 45

## 2012-09-28 NOTE — Progress Notes (Signed)
Patient ID: Gregory Cortez, male   DOB: 1956/06/30, 56 y.o.   MRN: [id number]    PCP - Dr. Laurice Record  Onco - Dr. Ross Marcus  Subjective: Pt feels ok.  Less pain.  Eating well.  BMs down to 2-3 a day  Objective: Vital signs in last 24 hours: Temp:  [97.8 F (36.6 C)-98.1 F (36.7 C)] 98.1 F (36.7 C) (10/11 0626) Pulse Rate:  [70-74] 70  (10/11 0626) Resp:  [18-20] 20  (10/11 0626) BP: (136-143)/(77-89) 136/77 mmHg (10/11 0626) SpO2:  [95 %-96 %] 95 % (10/11 0626) Last BM Date: 09/27/12  Intake/Output from previous day: 10/10 0701 - 10/11 0700 In: 2516.7 [P.O.:220; I.V.:2286.7] Out: 805 [Urine:700; Drains:105] Intake/Output this shift:    PE: Abd: soft, mildly tender in RLQ, drain with some purulent drainage still. +BS, cellulitis continues to improve with decrease in induration and edema  Lab Results:   Basename 09/27/12 0345 10-03-12 0343  WBC 8.3 9.3  HGB 10.5* 11.0*  HCT 32.4* 34.1*  PLT 556* 479*   BMET  Basename 2012/10/03 0343  NA 140  K 4.0  CL 103  CO2 28  GLUCOSE 83  BUN 5*  CREATININE 0.68  CALCIUM 8.5   PT/INR  Basename 09/28/12 0349 09/27/12 0345  LABPROT 19.6* 21.1*  INR 1.72* 1.90*   CMP     Component Value Date/Time   NA 140 10-03-2012 0343   K 4.0 10/03/2012 0343   CL 103 10/03/12 0343   CO2 28 10-03-12 0343   GLUCOSE 83 10/03/12 0343   BUN 5* Oct 03, 2012 0343   CREATININE 0.68 Oct 03, 2012 0343   CALCIUM 8.5 10/03/2012 0343   PROT 6.5 09/21/2012 2345   ALBUMIN 2.3* 09/21/2012 2345   AST 20 09/21/2012 2345   ALT 24 09/21/2012 2345   ALKPHOS 117 09/21/2012 2345   BILITOT 0.6 09/21/2012 2345   GFRNONAA >90 03-Oct-2012 0343   GFRAA >90 2012/10/03 0343   Lipase  No results found for this basename: lipase       Studies/Results: Dg Abd 2 Views  10-03-2012  *RADIOLOGY REPORT*  Clinical Data: Abdominal pain.  Status post percutaneous drain placement for abscess.  ABDOMEN - 2 VIEW  Comparison: Abdominal CT scan 09/22/2012.  Findings: Multiple  supine views of the abdomen demonstrate a paucity of bowel gas.  There is a small amount of distal colonic and trace amount of rectal gas noted.  No definite pathologic distension of small bowel is identified.  No gross pneumoperitoneum.  A percutaneous drainage catheter is seen projecting over the right side of the abdomen.  IMPRESSION: 1.  Nonspecific, nonobstructive bowel gas pattern. 2.  No pneumoperitoneum. 3.  Percutaneous drainage catheter in place projecting over the right side of the abdomen.   Original Report Authenticated By: Florencia Reasons, M.D.     Anti-infectives: Anti-infectives     Start     Dose/Rate Route Frequency Ordered Stop   09/28/12 0800   vancomycin (VANCOCIN) 1,750 mg in sodium chloride 0.9 % 500 mL IVPB        1,750 mg 250 mL/hr over 120 Minutes Intravenous Every 12 hours 09/27/12 2315     09/25/12 2200   vancomycin (VANCOCIN) 1,500 mg in sodium chloride 0.9 % 500 mL IVPB  Status:  Discontinued        1,500 mg 250 mL/hr over 120 Minutes Intravenous Every 12 hours 09/25/12 1044 09/27/12 2315   09/25/12 1200   vancomycin (VANCOCIN) 2,000 mg in sodium chloride 0.9 %  500 mL IVPB        2,000 mg 250 mL/hr over 120 Minutes Intravenous  Once 09/25/12 1044 09/25/12 1359   09/22/12 0545  piperacillin-tazobactam (ZOSYN) IVPB 3.375 g       3.375 g 12.5 mL/hr over 240 Minutes Intravenous 3 times per day 09/22/12 0542     09/22/12 0430   ertapenem (INVANZ) 1 g in sodium chloride 0.9 % 50 mL IVPB        1 g 100 mL/hr over 30 Minutes Intravenous  Once 09/22/12 0416 09/22/12 0457           Assessment/Plan  1. S/p lap appy for perf appy  F. Byerly - 09/13/2012 2. Intra-abdominal abscess,   s/p perc drain - 09/23/2012  On Vanc and Zosyn.  Note:  Abscess grew out only E. Coli.  Vanc added because of right flank cellulitis in the face of what appears to be appropriate antibiotics. 3. H/o PE  On full dose Lovenox 4. Abdominal wall cellulitis  Plan: 1. Cont IV abx  therapy 2. CT scan tomorrow to evaluate the abscess cavity 3. Can we restart his coumadin? 4. Suspect fairly soon he can be transitioned to oral abx therapy.  Wanting his cellulitis to continue to improve prior to switching him.   LOS: 7 days    OSBORNE,KELLY E 09/28/2012, 8:55 AM Pager: [phone]  Seems to be doing steadily better.  Will hold Coumadin until after CT scan, in case something is found that requires intervention.  Wife in room. If CT scan looks good - can restart Coumadin and probably go to one oral antibiotic.  Unclear at this time how long the drain needs to stay in place.  Ovidio Kin, MD, [facility] Pager: [phone] Office phone:  [phone]

## 2012-09-28 NOTE — Progress Notes (Signed)
IVF stopped, IV noted to be infiltrated. I have paged IV team for restart, patient requests they start IV since have started last 2 IV for him

## 2012-09-28 NOTE — Progress Notes (Signed)
Per [place] Iv team called back states they will be up shortly to restart IV.

## 2012-09-29 ENCOUNTER — Inpatient Hospital Stay (HOSPITAL_COMMUNITY): Payer: BC Managed Care – PPO

## 2012-09-29 LAB — CBC
HCT: 34 % — ABNORMAL LOW (ref 39.0–52.0)
Hemoglobin: 11.1 g/dL — ABNORMAL LOW (ref 13.0–17.0)
MCH: 29.9 pg (ref 26.0–34.0)
MCHC: 32.6 g/dL (ref 30.0–36.0)
MCV: 91.6 fL (ref 78.0–100.0)
Platelets: 613 10*3/uL — ABNORMAL HIGH (ref 150–400)
RBC: 3.71 MIL/uL — ABNORMAL LOW (ref 4.22–5.81)
RDW: 13.6 % (ref 11.5–15.5)
WBC: 6.4 10*3/uL (ref 4.0–10.5)

## 2012-09-29 LAB — PROTIME-INR
INR: 1.46 (ref 0.00–1.49)
Prothrombin Time: 17.3 seconds — ABNORMAL HIGH (ref 11.6–15.2)

## 2012-09-29 MED ORDER — WARFARIN - PHARMACIST DOSING INPATIENT: Freq: Every day | Status: DC

## 2012-09-29 MED ORDER — WARFARIN SODIUM 10 MG PO TABS
10.0000 mg | ORAL_TABLET | Freq: Once | ORAL | Status: AC
  Administered 2012-09-29: 10 mg via ORAL
  Filled 2012-09-29: qty 1

## 2012-09-29 MED ORDER — IOHEXOL 300 MG/ML  SOLN
100.0000 mL | Freq: Once | INTRAMUSCULAR | Status: AC | PRN
  Administered 2012-09-29: 100 mL via INTRAVENOUS

## 2012-09-29 NOTE — Progress Notes (Signed)
Patient ID: Gregory Cortez, male   DOB: 03-Jan-1956, 56 y.o.   MRN: [id number] [facility] Progress Note:   * No surgery found *  Subjective: Mental status is clear; no complaints after CT scan Objective: Vital signs in last 24 hours: Temp:  [97.4 F (36.3 C)-97.9 F (36.6 C)] 97.6 F (36.4 C) (10/12 0657) Pulse Rate:  [60-76] 60  (10/12 0657) Resp:  [18] 18  (10/12 0657) BP: (123-152)/(67-93) 152/93 mmHg (10/12 0657) SpO2:  [94 %-96 %] 96 % (10/12 0657)  Intake/Output from previous day: 10/11 0701 - 10/12 0700 In: 3863.3 [P.O.:1440; I.V.:2413.3] Out: 39 [Urine:4; Drains:35] Intake/Output this shift: Total I/O In: 240 [P.O.:240] Out: 20 [Drains:20]  Physical Exam: Work of breathing is normal.  Drainage from JP old blood.    Lab Results:  Results for orders placed during the hospital encounter of 09/21/12 (from the past 48 hour(s))  VANCOMYCIN, TROUGH     Status: Abnormal   Collection Time   09/27/12  8:46 PM      Component Value Range Comment   Vancomycin Tr 8.0 (*) 10.0 - 20.0 ug/mL   PROTIME-INR     Status: Abnormal   Collection Time   09/28/12  3:49 AM      Component Value Range Comment   Prothrombin Time 19.6 (*) 11.6 - 15.2 seconds    INR 1.72 (*) 0.00 - 1.49   PROTIME-INR     Status: Abnormal   Collection Time   09/29/12  4:09 AM      Component Value Range Comment   Prothrombin Time 17.3 (*) 11.6 - 15.2 seconds    INR 1.46  0.00 - 1.49   CBC     Status: Abnormal   Collection Time   09/29/12  8:51 AM      Component Value Range Comment   WBC 6.4  4.0 - 10.5 K/uL    RBC 3.71 (*) 4.22 - 5.81 MIL/uL    Hemoglobin 11.1 (*) 13.0 - 17.0 g/dL    HCT 84.6 (*) 96.2 - 52.0 %    MCV 91.6  78.0 - 100.0 fL    MCH 29.9  26.0 - 34.0 pg    MCHC 32.6  30.0 - 36.0 g/dL    RDW 95.2  84.1 - 32.4 %    Platelets 613 (*) 150 - 400 K/uL     Radiology/Results: Ct Abdomen Pelvis W Contrast  09/29/2012  *RADIOLOGY REPORT*  Clinical Data: Evaluate abscess, drain was  placed on October 6 now with decreased output  CT ABDOMEN AND PELVIS WITH CONTRAST  Technique:  Multidetector CT imaging of the abdomen and pelvis was performed following the standard protocol during bolus administration of intravenous contrast.  Contrast: OMNIPAQUE IOHEXOL 300 MG/ML  SOLN  Comparison: 09/22/2012  Findings: Small right pleural effusions similar to the prior study with underlying atelectasis.  Liver and gallbladder are normal.  There is a small hiatal hernia. Spleen and pancreas are normal.  Adrenal glands and kidneys are normal.  Aorta is not dilated.  There are numerous small retroperitoneal lymph nodes similar to the prior study.  There is a nonobstructive bowel gas pattern.  The bladder is normal.  A subcutaneous soft tissue 2 cm nodule anterior right inferior pelvic wall is stable. There are no acute musculoskeletal abnormalities.  There is a percutaneous pigtail catheters seen in the right abdominal perinephric region, with tip just anterior to the perinephric fascia.  There is moderate inflammatory change in this region.  Previously identified fluid collection is no longer visible.  There is an extremely tiny volume of free fluid along the right flank.  IMPRESSION: Although inflammatory change persists, it is decreased in severity, and there is no longer a focal fluid collection.   Original Report Authenticated By: Otilio Carpen, M.D.     Anti-infectives: Anti-infectives     Start     Dose/Rate Route Frequency Ordered Stop   09/28/12 0800   vancomycin (VANCOCIN) 1,750 mg in sodium chloride 0.9 % 500 mL IVPB        1,750 mg 250 mL/hr over 120 Minutes Intravenous Every 12 hours 09/27/12 2315     09/25/12 2200   vancomycin (VANCOCIN) 1,500 mg in sodium chloride 0.9 % 500 mL IVPB  Status:  Discontinued        1,500 mg 250 mL/hr over 120 Minutes Intravenous Every 12 hours 09/25/12 1044 09/27/12 2315   09/25/12 1200   vancomycin (VANCOCIN) 2,000 mg in sodium chloride 0.9 %  500 mL IVPB        2,000 mg 250 mL/hr over 120 Minutes Intravenous  Once 09/25/12 1044 09/25/12 1359   09/22/12 0545  piperacillin-tazobactam (ZOSYN) IVPB 3.375 g       3.375 g 12.5 mL/hr over 240 Minutes Intravenous 3 times per day 09/22/12 0542     09/22/12 0430   ertapenem (INVANZ) 1 g in sodium chloride 0.9 % 50 mL IVPB        1 g 100 mL/hr over 30 Minutes Intravenous  Once 09/22/12 0416 09/22/12 0457          Assessment/Plan: Problem List: Patient Active Problem List  Diagnosis  . HYPOGONADISM  . Anxiety state, unspecified  . SLEEP APNEA, OBSTRUCTIVE  . GERD  . ERECTILE DYSFUNCTION, ORGANIC  . LOW BACK PAIN  . EDEMA LEG  . Chronic anticoagulation for h/o Pulmonary embolism  . Primary hypercoagulable state  . Obesity, Class III, BMI 40-49.9 (morbid obesity)  . Appendicitis with peritonitis s/p lap appy 09/13/2012  . Abscess, retroperitoneal    CT scan reviewed and looks improved.  Will restart coumadin with plan to move toward discharge Monday.  * No surgery found *    LOS: 8 days   Matt B. Daphine Deutscher, MD, [facility] [phone] beeper [phone]  09/29/2012 10:58 AM

## 2012-09-29 NOTE — Progress Notes (Signed)
ANTICOAGULATION CONSULT NOTE  Pharmacy Consult for Coumadin Indication: Hx PE  No Known Allergies  Patient Measurements: Height: 5\' 10"  (177.8 cm) Weight: 334 lb (151.501 kg) (stated --weighed in last 48hr post surgery) IBW/kg (Calculated) : 73   Vital Signs: Temp: 97.6 F (36.4 C) (10/12 0657) Temp src: Oral (10/12 0657) BP: 152/93 mmHg (10/12 0657) Pulse Rate: 60  (10/12 0657)  Labs:  Basename 09/29/12 0851 09/29/12 0409 09/28/12 0349 09/27/12 0345  HGB 11.1* -- -- 10.5*  HCT 34.0* -- -- 32.4*  PLT 613* -- -- 556*  APTT -- -- -- --  LABPROT -- 17.3* 19.6* 21.1*  INR -- 1.46 1.72* 1.90*  HEPARINUNFRC -- -- -- --  CREATININE -- -- -- --  CKTOTAL -- -- -- --  CKMB -- -- -- --  TROPONINI -- -- -- --    Estimated Creatinine Clearance: 157.7 ml/min (by C-G formula based on Cr of 0.68).   Medical History: Past Medical History  Diagnosis Date  . OSA (obstructive sleep apnea)     dr Shelle Iron  . Testosterone deficiency     dr Patsi Sears, shots every 2 weeks  . ED (erectile dysfunction)   . GERD (gastroesophageal reflux disease)     [facility]  . Low back pain     dr Channing Mutters, dr Farris Has, dr Ethelene Hal, herniated disc L4-5  . PE (pulmonary embolism)     bilateral sep 2011 and again bilateral May 2012  . 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 Primary hypercoagulable state     no etiology found per Dr. Shirline Frees   . Hx of colonoscopy   . Pulmonary embolism 02/22/2010    CT angio Dx    Medications:  Scheduled:     . acetaminophen  1,000 mg Oral TID  . enoxaparin  150 mg Subcutaneous BID  . folic acid  1 mg Oral Daily  . lip balm  1 application Topical BID  . multivitamins with iron  1 tablet Oral Daily  . pantoprazole  80 mg Oral Q1200  . piperacillin-tazobactam (ZOSYN)  IV  3.375 g Intravenous Q8H  . psyllium  1 packet Oral BID  . tolnaftate   Topical BID  . vancomycin  1,750 mg Intravenous Q12H    Assessment:  56yo M w/ recent appendectomy, admitted 10/4 with  fever and flank pain.  Peritoneal abscess discovered and drained on 10/6. Pt on Coumadin PTA for hx PE and Coumadin resumed per pharmacy dosing 10/7 but held again 10/9 for possible surgical intervention for abdominal wall cellulitis. Coumadin now to restart as pt is improving and no invasive procedures expected.  Bridging with Lovenox (MD dosing) 150mg  sq q12h   Home Coumadin dose: 10mg  daily except 12.5mg  on MWF.  INR down, subtherapeutic after no Coumadin x 3 nights.  CBC stable. No bleeding reported/documented. Broad-spectrum abx may increase sensitivity to Coumadin.  Goal of Therapy:  INR 2-3 Monitor platelets by anticoagulation protocol: Yes   Plan:   Coumadin 10mg  today.   Lovenox 150mg  SQ q12h until INR 2 or greater.  Daily PT/INR and CBC.  Gwen Her PharmD  [phone] 09/29/2012 11:21 AM

## 2012-09-29 NOTE — Progress Notes (Signed)
Patient taken to radiology. Spoke to Dr. Abbey Chatters on floor this am, informed him that yesterday drain was putting out a tan purulent drainage, but today is bloody serosanguinous and has leaked bloody drainage onto drsg, but does not appear to have moved when drsg pulled back however reddened, but patient on vancomycin for cellulities surrounding drain. MD reviewed labs and states will order CBC and see results of Ct scan when patient gets back. MD states ok to go ahead and give lovenox.

## 2012-09-30 LAB — PROTIME-INR
INR: 1.33 (ref 0.00–1.49)
Prothrombin Time: 16.2 seconds — ABNORMAL HIGH (ref 11.6–15.2)

## 2012-09-30 LAB — CREATININE, SERUM
Creatinine, Ser: 0.8 mg/dL (ref 0.50–1.35)
GFR calc Af Amer: >90 mL/min (ref >90)
GFR calc non Af Amer: >90 mL/min (ref >90)

## 2012-09-30 LAB — VANCOMYCIN, TROUGH: Vancomycin Tr: 8.5 ug/mL — ABNORMAL LOW (ref 10.0–20.0)

## 2012-09-30 MED ORDER — VANCOMYCIN HCL 1000 MG IV SOLR
1500.0000 mg | Freq: Three times a day (TID) | INTRAVENOUS | Status: DC
  Administered 2012-09-30 – 2012-10-01 (×3): 1500 mg via INTRAVENOUS
  Filled 2012-09-30 (×5): qty 1500

## 2012-09-30 MED ORDER — WARFARIN SODIUM 2.5 MG PO TABS
12.5000 mg | ORAL_TABLET | Freq: Once | ORAL | Status: AC
  Administered 2012-09-30: 12.5 mg via ORAL
  Filled 2012-09-30: qty 1

## 2012-09-30 NOTE — Progress Notes (Signed)
ANTIBIOTIC CONSULT NOTE - f/u   Pharmacy Consult for Vancomycin Indication: Appendicitis s/p lap appendectomy/ RLQ abscess  No Known Allergies  Patient Measurements: Height: 5\' 10"  (177.8 cm) Weight: 334 lb (151.501 kg) (stated --weighed in last 48hr post surgery) IBW/kg (Calculated) : 73  Adjusted Body Weight: 97kg  Vital Signs: Temp: 98.8 F (37.1 C) (10/12 2042) Temp src: Oral (10/12 2042) BP: 142/76 mmHg (10/12 2042) Pulse Rate: 75  (10/12 2042) Intake/Output from previous day: 10/12 0701 - 10/13 0700 In: 3534 [P.O.:720; I.V.:2800] Out: 40 [Drains:40] Intake/Output from this shift: Total I/O In: 404 [I.V.:400; Other:4] Out: -   Labs:  Basename 09/29/12 2329 09/29/12 0851 09/27/12 0345  WBC -- 6.4 8.3  HGB -- 11.1* 10.5*  PLT -- 613* 556*  LABCREA -- -- --  CREATININE 0.80 -- --   Estimated Creatinine Clearance: 157.7 ml/min (by C-G formula based on Cr of 0.8).  Basename 09/29/12 2329 09/27/12 2046  VANCOTROUGH 8.5* 8.0*  VANCOPEAK -- --  Drue Dun -- --  GENTTROUGH -- --  GENTPEAK -- --  GENTRANDOM -- --  TOBRATROUGH -- --  TOBRAPEAK -- --  TOBRARND -- --  AMIKACINPEAK -- --  AMIKACINTROU -- --  AMIKACIN -- --     Microbiology: Recent Results (from the past 720 hour(s))  SURGICAL PCR SCREEN     Status: Abnormal   Collection Time   09/12/12  5:00 AM      Component Value Range Status Comment   MRSA, PCR NEGATIVE  NEGATIVE Final    Staphylococcus aureus POSITIVE (*) NEGATIVE Final   CULTURE, ROUTINE-ABSCESS     Status: Normal   Collection Time   09/23/12 11:22 AM      Component Value Range Status Comment   Specimen Description PERITONEAL CAVITY   Final    Special Requests Normal   Final    Gram Stain     Final    Value: ABUNDANT WBC PRESENT,BOTH PMN AND MONONUCLEAR     NO SQUAMOUS EPITHELIAL CELLS SEEN     ABUNDANT GRAM POSITIVE COCCI IN CLUSTERS     IN PAIRS IN CHAINS   Culture MODERATE ESCHERICHIA COLI   Final    Report Status 09/26/2012  FINAL   Final    Organism ID, Bacteria ESCHERICHIA COLI   Final   ANAEROBIC CULTURE     Status: Normal   Collection Time   09/23/12 11:22 AM      Component Value Range Status Comment   Specimen Description PERITONEAL CAVITY   Final    Special Requests Normal   Final    Gram Stain     Final    Value: ABUNDANT WBC PRESENT,BOTH PMN AND MONONUCLEAR     NO SQUAMOUS EPITHELIAL CELLS SEEN     ABUNDANT GRAM POSITIVE COCCI IN CLUSTERS     IN PAIRS IN CHAINS   Culture NO ANAEROBES ISOLATED   Final    Report Status 09/28/2012 FINAL   Final   CLOSTRIDIUM DIFFICILE BY PCR     Status: Normal   Collection Time   09/26/12 10:48 AM      Component Value Range Status Comment   C difficile by pcr NEGATIVE  NEGATIVE Final     Medical History: Past Medical History  Diagnosis Date  . OSA (obstructive sleep apnea)     dr Shelle Iron  . Testosterone deficiency     dr Patsi Sears, shots every 2 weeks  . ED (erectile dysfunction)   . GERD (gastroesophageal reflux disease)  [facility]  . Low back pain     dr Channing Mutters, dr Farris Has, dr Ethelene Hal, herniated disc L4-5  . PE (pulmonary embolism)     bilateral sep 2011 and again bilateral May 2012  . 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 Primary hypercoagulable state     no etiology found per Dr. Shirline Frees   . Hx of colonoscopy   . Pulmonary embolism 02/22/2010    CT angio Dx    Medications:  Anti-infectives     Start     Dose/Rate Route Frequency Ordered Stop   09/28/12 0800   vancomycin (VANCOCIN) 1,750 mg in sodium chloride 0.9 % 500 mL IVPB        1,750 mg 250 mL/hr over 120 Minutes Intravenous Every 12 hours 09/27/12 2315     09/25/12 2200   vancomycin (VANCOCIN) 1,500 mg in sodium chloride 0.9 % 500 mL IVPB  Status:  Discontinued        1,500 mg 250 mL/hr over 120 Minutes Intravenous Every 12 hours 09/25/12 1044 09/27/12 2315   09/25/12 1200   vancomycin (VANCOCIN) 2,000 mg in sodium chloride 0.9 % 500 mL IVPB        2,000 mg 250 mL/hr over 120 Minutes  Intravenous  Once 09/25/12 1044 09/25/12 1359   09/22/12 0545   piperacillin-tazobactam (ZOSYN) IVPB 3.375 g        3.375 g 12.5 mL/hr over 240 Minutes Intravenous 3 times per day 09/22/12 0542     09/22/12 0430   ertapenem (INVANZ) 1 g in sodium chloride 0.9 % 50 mL IVPB        1 g 100 mL/hr over 30 Minutes Intravenous  Once 09/22/12 0416 09/22/12 0457         Assessment:  56yo M admitted with fever and pain s/p lap appendectomy. Found to have RLQ abscess.  Afebrile, WBCs 6.4 (10/12)  SCr = 0.8 (10/12)  Obese (BMI~48)  Abscess fluid cx: E Coli - pan-sensitive  VT = 8.5 mg/L on Vancomycin 1750mg  IV q12h (trough = 8 on prior 1500 q12h regimen)  Patient last received Vancomycin 1750mg  @ 23:35 on 10/12.   Goal of Therapy:  Vancomycin trough 15-20mg /l.   Plan:   Change Vancomycin to 1500mg  IV q8h  Measure Vanc trough at steady state.  Possible to narrow ABX with pan-sensitive culture results?  Maryellen Pile, PharmD 09/30/2012 1:12 AM

## 2012-09-30 NOTE — Progress Notes (Signed)
Patient ID: Gregory Cortez, male   DOB: [birth year], 56 y.o.   MRN: [id number] [facility] Surgery Progress Note:   * No surgery found *  Subjective: Mental status is clear.  Awakened from sleep with CPAP mask in place.  No complaints.  Cellulitis on right flank is better.  Objective: Vital signs in last 24 hours: Temp:  [98.1 F (36.7 C)-98.8 F (37.1 C)] 98.1 F (36.7 C) (10/13 0655) Pulse Rate:  [70-79] 79  (10/13 0655) Resp:  [16-18] 18  (10/13 0655) BP: (130-142)/(74-82) 134/74 mmHg (10/13 0655) SpO2:  [93 %-97 %] 94 % (10/13 0655)  Intake/Output from previous day: 10/12 0701 - 10/13 0700 In: 4394 [P.O.:720; I.V.:3600; IV Piggyback:50] Out: 60 [Drains:60] Intake/Output this shift:    Physical Exam: Work of breathing is OK.  JP has serosang drainage.  Feeling better  Lab Results:  Results for orders placed during the hospital encounter of 09/21/12 (from the past 48 hour(s))  PROTIME-INR     Status: Abnormal   Collection Time   09/29/12  4:09 AM      Component Value Range Comment   Prothrombin Time 17.3 (*) 11.6 - 15.2 seconds    INR 1.46  0.00 - 1.49   CBC     Status: Abnormal   Collection Time   09/29/12  8:51 AM      Component Value Range Comment   WBC 6.4  4.0 - 10.5 K/uL    RBC 3.71 (*) 4.22 - 5.81 MIL/uL    Hemoglobin 11.1 (*) 13.0 - 17.0 g/dL    HCT 56.2 (*) 13.0 - 52.0 %    MCV 91.6  78.0 - 100.0 fL    MCH 29.9  26.0 - 34.0 pg    MCHC 32.6  30.0 - 36.0 g/dL    RDW 86.5  78.4 - 69.6 %    Platelets 613 (*) 150 - 400 K/uL   CREATININE, SERUM     Status: Normal   Collection Time   09/29/12 11:29 PM      Component Value Range Comment   Creatinine, Ser 0.80  0.50 - 1.35 mg/dL    GFR calc non Af Amer >90  >90 mL/min    GFR calc Af Amer >90  >90 mL/min   VANCOMYCIN, TROUGH     Status: Abnormal   Collection Time   09/29/12 11:29 PM      Component Value Range Comment   Vancomycin Tr 8.5 (*) 10.0 - 20.0 ug/mL   PROTIME-INR     Status: Abnormal   Collection Time   09/30/12  4:25 AM      Component Value Range Comment   Prothrombin Time 16.2 (*) 11.6 - 15.2 seconds    INR 1.33  0.00 - 1.49     Radiology/Results: Ct Abdomen Pelvis W Contrast  09/29/2012  *RADIOLOGY REPORT*  Clinical Data: Evaluate abscess, drain was placed on October 6 now with decreased output  CT ABDOMEN AND PELVIS WITH CONTRAST  Technique:  Multidetector CT imaging of the abdomen and pelvis was performed following the standard protocol during bolus administration of intravenous contrast.  Contrast: OMNIPAQUE IOHEXOL 300 MG/ML  SOLN  Comparison: 09/22/2012  Findings: Small right pleural effusions similar to the prior study with underlying atelectasis.  Liver and gallbladder are normal.  There is a small hiatal hernia. Spleen and pancreas are normal.  Adrenal glands and kidneys are normal.  Aorta is not dilated.  There are numerous small retroperitoneal lymph nodes similar to  the prior study.  There is a nonobstructive bowel gas pattern.  The bladder is normal.  A subcutaneous soft tissue 2 cm nodule anterior right inferior pelvic wall is stable. There are no acute musculoskeletal abnormalities.  There is a percutaneous pigtail catheters seen in the right abdominal perinephric region, with tip just anterior to the perinephric fascia.  There is moderate inflammatory change in this region.  Previously identified fluid collection is no longer visible.  There is an extremely tiny volume of free fluid along the right flank.  IMPRESSION: Although inflammatory change persists, it is decreased in severity, and there is no longer a focal fluid collection.   Original Report Authenticated By: Otilio Carpen, M.D.     Anti-infectives: Anti-infectives     Start     Dose/Rate Route Frequency Ordered Stop   09/30/12 0800   vancomycin (VANCOCIN) 1,500 mg in sodium chloride 0.9 % 500 mL IVPB        1,500 mg 250 mL/hr over 120 Minutes Intravenous Every 8 hours 09/30/12 0117      09/28/12 0800   vancomycin (VANCOCIN) 1,750 mg in sodium chloride 0.9 % 500 mL IVPB  Status:  Discontinued        1,750 mg 250 mL/hr over 120 Minutes Intravenous Every 12 hours 09/27/12 2315 09/30/12 0117   09/25/12 2200   vancomycin (VANCOCIN) 1,500 mg in sodium chloride 0.9 % 500 mL IVPB  Status:  Discontinued        1,500 mg 250 mL/hr over 120 Minutes Intravenous Every 12 hours 09/25/12 1044 09/27/12 2315   09/25/12 1200   vancomycin (VANCOCIN) 2,000 mg in sodium chloride 0.9 % 500 mL IVPB        2,000 mg 250 mL/hr over 120 Minutes Intravenous  Once 09/25/12 1044 09/25/12 1359   09/22/12 0545  piperacillin-tazobactam (ZOSYN) IVPB 3.375 g       3.375 g 12.5 mL/hr over 240 Minutes Intravenous 3 times per day 09/22/12 0542     09/22/12 0430   ertapenem (INVANZ) 1 g in sodium chloride 0.9 % 50 mL IVPB        1 g 100 mL/hr over 30 Minutes Intravenous  Once 09/22/12 0416 09/22/12 0457          Assessment/Plan: Problem List: Patient Active Problem List  Diagnosis  . HYPOGONADISM  . Anxiety state, unspecified  . SLEEP APNEA, OBSTRUCTIVE  . GERD  . ERECTILE DYSFUNCTION, ORGANIC  . LOW BACK PAIN  . EDEMA LEG  . Chronic anticoagulation for h/o Pulmonary embolism  . Primary hypercoagulable state  . Obesity, Class III, BMI 40-49.9 (morbid obesity)  . Appendicitis with peritonitis s/p lap appy 09/13/2012  . Abscess, retroperitoneal    The plan includes hopeful discharge tomorrow. Whether he goes home with the drain in place with irrigation or is pulled e before discharge will defer to DOW  tomorrow * No surgery found *    LOS: 9 days   Matt B. Daphine Deutscher, MD, [facility] [phone] beeper [phone]  09/30/2012 8:33 AM

## 2012-09-30 NOTE — Progress Notes (Signed)
Subjective: Pt ok.  Objective: Physical Exam: BP 134/74  Pulse 79  Temp 98.1 F (36.7 C) (Oral)  Resp 18  Ht 5\' 10"  (1.778 m)  Wt 334 lb (151.501 kg)  BMI 47.92 kg/m2  SpO2 94% Drain intact, cellulitis improved. Not much output from drain, though 60cc recorded yesterday  Labs: CBC  Basename 09/29/12 0851  WBC 6.4  HGB 11.1*  HCT 34.0*  PLT 613*   BMET  Basename 09/29/12 2329  NA --  K --  CL --  CO2 --  GLUCOSE --  BUN --  CREATININE 0.80  CALCIUM --   LFT No results found for this basename: PROT,ALBUMIN,AST,ALT,ALKPHOS,BILITOT,BILIDIR,IBILI,LIPASE in the last 72 hours PT/INR  Basename 09/30/12 0425 09/29/12 0409  LABPROT 16.2* 17.3*  INR 1.33 1.46     Studies/Results: Ct Abdomen Pelvis W Contrast  09/29/2012  *RADIOLOGY REPORT*  Clinical Data: Evaluate abscess, drain was placed on October 6 now with decreased output  CT ABDOMEN AND PELVIS WITH CONTRAST  Technique:  Multidetector CT imaging of the abdomen and pelvis was performed following the standard protocol during bolus administration of intravenous contrast.  Contrast: OMNIPAQUE IOHEXOL 300 MG/ML  SOLN  Comparison: 09/22/2012  Findings: Small right pleural effusions similar to the prior study with underlying atelectasis.  Liver and gallbladder are normal.  There is a small hiatal hernia. Spleen and pancreas are normal.  Adrenal glands and kidneys are normal.  Aorta is not dilated.  There are numerous small retroperitoneal lymph nodes similar to the prior study.  There is a nonobstructive bowel gas pattern.  The bladder is normal.  A subcutaneous soft tissue 2 cm nodule anterior right inferior pelvic wall is stable. There are no acute musculoskeletal abnormalities.  There is a percutaneous pigtail catheters seen in the right abdominal perinephric region, with tip just anterior to the perinephric fascia.  There is moderate inflammatory change in this region.  Previously identified fluid collection is no  longer visible.  There is an extremely tiny volume of free fluid along the right flank.  IMPRESSION: Although inflammatory change persists, it is decreased in severity, and there is no longer a focal fluid collection.   Original Report Authenticated By: Otilio Carpen, M.D.     Assessment/Plan: s/p RLQ abscess drainage 10/6 Repeat CT yesterday shows nno residual fluid collection. Per [facility] note, probable DC tomorrow, unsure if drain will remain and to be pulled tomorrow     LOS: 9 days    Brayton El PA-C 09/30/2012 8:53 AM

## 2012-09-30 NOTE — Progress Notes (Signed)
ANTICOAGULATION CONSULT NOTE  Pharmacy Consult for Coumadin Indication: Hx PE  No Known Allergies  Patient Measurements: Height: 5\' 10"  (177.8 cm) Weight: 334 lb (151.501 kg) (stated --weighed in last 48hr post surgery) IBW/kg (Calculated) : 73   Vital Signs: Temp: 98.1 F (36.7 C) (10/13 0655) Temp src: Oral (10/13 0655) BP: 134/74 mmHg (10/13 0655) Pulse Rate: 79  (10/13 0655)  Labs:  Basename 09/30/12 0425 09/29/12 2329 09/29/12 0851 09/29/12 0409 09/28/12 0349  HGB -- -- 11.1* -- --  HCT -- -- 34.0* -- --  PLT -- -- 613* -- --  APTT -- -- -- -- --  LABPROT 16.2* -- -- 17.3* 19.6*  INR 1.33 -- -- 1.46 1.72*  HEPARINUNFRC -- -- -- -- --  CREATININE -- 0.80 -- -- --  CKTOTAL -- -- -- -- --  CKMB -- -- -- -- --  TROPONINI -- -- -- -- --    Estimated Creatinine Clearance: 157.7 ml/min (by C-G formula based on Cr of 0.8).   Medical History: Past Medical History  Diagnosis Date  . OSA (obstructive sleep apnea)     dr Shelle Iron  . Testosterone deficiency     dr Patsi Sears, shots every 2 weeks  . ED (erectile dysfunction)   . GERD (gastroesophageal reflux disease)     [facility]  . Low back pain     dr Channing Mutters, dr Farris Has, dr Ethelene Hal, herniated disc L4-5  . PE (pulmonary embolism)     bilateral sep 2011 and again bilateral May 2012  . 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 Primary hypercoagulable state     no etiology found per Dr. Shirline Frees   . Hx of colonoscopy   . Pulmonary embolism 02/22/2010    CT angio Dx    Medications:  Scheduled:     . acetaminophen  1,000 mg Oral TID  . enoxaparin  150 mg Subcutaneous BID  . folic acid  1 mg Oral Daily  . lip balm  1 application Topical BID  . multivitamins with iron  1 tablet Oral Daily  . pantoprazole  80 mg Oral Q1200  . piperacillin-tazobactam (ZOSYN)  IV  3.375 g Intravenous Q8H  . psyllium  1 packet Oral BID  . tolnaftate   Topical BID  . vancomycin  1,500 mg Intravenous Q8H  . warfarin  10 mg Oral ONCE-1800    . Warfarin - Pharmacist Dosing Inpatient   Does not apply q1800  . DISCONTD: vancomycin  1,750 mg Intravenous Q12H    Assessment:  56yo M w/ recent appendectomy, admitted 10/4 with fever and flank pain.  Peritoneal abscess discovered and drained on 10/6. Pt on Coumadin PTA for hx PE and Coumadin resumed per pharmacy dosing 10/7 but held again 10/9 for possible surgical intervention for abdominal wall cellulitis. Coumadin now to restart as pt is improving and no invasive procedures expected.  Bridging with Lovenox (MD dosing) 150mg  sq q12h   Home Coumadin dose: 10mg  daily except 12.5mg  on MWF.  INR down, subtherapeutic likely due to no Coumadin x 3 nights. Coumadin 10mg  last night. CBC stable. No bleeding reported/documented. Broad-spectrum abx may increase sensitivity to Coumadin.  Goal of Therapy:  INR 2-3 Monitor platelets by anticoagulation protocol: Yes   Plan:   Coumadin 12.5mg  today.   Lovenox 150mg  SQ q12h until INR 2 or greater.  Daily PT/INR and CBC.  Gwen Her PharmD  [phone] 09/30/2012 9:48 AM

## 2012-10-01 ENCOUNTER — Encounter: Payer: BC Managed Care – PPO | Admitting: Family

## 2012-10-01 LAB — PROTIME-INR
INR: 1.38 (ref 0.00–1.49)
Prothrombin Time: 16.6 seconds — ABNORMAL HIGH (ref 11.6–15.2)

## 2012-10-01 LAB — CREATININE, SERUM
Creatinine, Ser: 0.82 mg/dL (ref 0.50–1.35)
GFR calc Af Amer: >90 mL/min (ref >90)
GFR calc non Af Amer: >90 mL/min (ref >90)

## 2012-10-01 LAB — VANCOMYCIN, TROUGH: Vancomycin Tr: 19.9 ug/mL (ref 10.0–20.0)

## 2012-10-01 MED ORDER — WARFARIN SODIUM 2.5 MG PO TABS
12.5000 mg | ORAL_TABLET | Freq: Once | ORAL | Status: AC
  Administered 2012-10-01: 12.5 mg via ORAL
  Filled 2012-10-01: qty 1

## 2012-10-01 MED ORDER — OXYCODONE-ACETAMINOPHEN 5-325 MG PO TABS: 1.0000 | ORAL_TABLET | ORAL | Status: DC | PRN

## 2012-10-01 MED ORDER — AMOXICILLIN-POT CLAVULANATE 875-125 MG PO TABS: 1.0000 | ORAL_TABLET | Freq: Two times a day (BID) | ORAL | Status: DC

## 2012-10-01 NOTE — Progress Notes (Signed)
Subjective: Pt ok.  Objective: Physical Exam: BP 126/78  Pulse 62  Temp 97.8 F (36.6 C) (Oral)  Resp 20  Ht 5\' 10"  (1.778 m)  Wt 334 lb (151.501 kg)  BMI 47.92 kg/m2  SpO2 97% Drain intact, cellulitis improved. Not much output from drain, only 28cc recorded  Labs: CBC  Basename 09/29/12 0851  WBC 6.4  HGB 11.1*  HCT 34.0*  PLT 613*   BMET  Basename 10/01/12 0704 09/29/12 2329  NA -- --  K -- --  CL -- --  CO2 -- --  GLUCOSE -- --  BUN -- --  CREATININE 0.82 0.80  CALCIUM -- --   LFT No results found for this basename: PROT,ALBUMIN,AST,ALT,ALKPHOS,BILITOT,BILIDIR,IBILI,LIPASE in the last 72 hours PT/INR  Basename 10/01/12 0704 09/30/12 0425  LABPROT 16.6* 16.2*  INR 1.38 1.33     Studies/Results: No results found.  Assessment/Plan: s/p RLQ abscess drainage 10/6 Repeat CT shows no residual fluid collection. Drain removed today at [facility] request. Pt to be discharged.      LOS: 10 days    Brayton El PA-C 10/01/2012 10:04 AM

## 2012-10-01 NOTE — Progress Notes (Signed)
ANTICOAGULATION CONSULT NOTE  Pharmacy Consult for Coumadin Indication: Hx PE  No Known Allergies  Patient Measurements: Height: 5\' 10"  (177.8 cm) Weight: 334 lb (151.501 kg) (stated --weighed in last 48hr post surgery) IBW/kg (Calculated) : 73   Vital Signs: Temp: 97.8 F (36.6 C) (10/14 0558) Temp src: Oral (10/14 0558) BP: 126/78 mmHg (10/14 0558) Pulse Rate: 62  (10/14 0558)  Labs:  Basename 10/01/12 0704 09/30/12 0425 09/29/12 2329 09/29/12 0851 09/29/12 0409  HGB -- -- -- 11.1* --  HCT -- -- -- 34.0* --  PLT -- -- -- 613* --  APTT -- -- -- -- --  LABPROT 16.6* 16.2* -- -- 17.3*  INR 1.38 1.33 -- -- 1.46  HEPARINUNFRC -- -- -- -- --  CREATININE 0.82 -- 0.80 -- --  CKTOTAL -- -- -- -- --  CKMB -- -- -- -- --  TROPONINI -- -- -- -- --    Estimated Creatinine Clearance: 153.8 ml/min (by C-G formula based on Cr of 0.82).   Medical History: Past Medical History  Diagnosis Date  . OSA (obstructive sleep apnea)     dr Shelle Iron  . Testosterone deficiency     dr Patsi Sears, shots every 2 weeks  . ED (erectile dysfunction)   . GERD (gastroesophageal reflux disease)     [facility]  . Low back pain     dr Channing Mutters, dr Farris Has, dr Ethelene Hal, herniated disc L4-5  . PE (pulmonary embolism)     bilateral sep 2011 and again bilateral May 2012  . 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 Primary hypercoagulable state     no etiology found per Dr. Shirline Frees   . Hx of colonoscopy   . Pulmonary embolism 02/22/2010    CT angio Dx    Medications:  Scheduled:     . acetaminophen  1,000 mg Oral TID  . enoxaparin  150 mg Subcutaneous BID  . folic acid  1 mg Oral Daily  . lip balm  1 application Topical BID  . multivitamins with iron  1 tablet Oral Daily  . pantoprazole  80 mg Oral Q1200  . piperacillin-tazobactam (ZOSYN)  IV  3.375 g Intravenous Q8H  . psyllium  1 packet Oral BID  . tolnaftate   Topical BID  . vancomycin  1,500 mg Intravenous Q8H  . warfarin  12.5 mg Oral  ONCE-1800  . Warfarin - Pharmacist Dosing Inpatient   Does not apply q1800    Assessment:  55yo M w/ recent appendectomy, admitted 10/4 with fever and flank pain.  Peritoneal abscess discovered and drained on 10/6. Pt on Coumadin PTA for hx PE and Coumadin resumed per pharmacy dosing 10/7 but held again 10/9 for possible surgical intervention for abdominal wall cellulitis. Coumadin restarted 10/12 as pt is improving and no invasive procedures expected.  INR subtherapeutic as we re-initiate warfarin.  Patient to continue Lovenox and warfarin at discharge today.  Bridging with Lovenox (MD dosing) 150mg  sq q12h.  Home Coumadin dose: 10mg  daily except 12.5mg  on MWF. CBC stable. No bleeding reported/documented.  Goal of Therapy:  INR 2-3 Monitor platelets by anticoagulation protocol: Yes   Plan:   Coumadin 12.5mg  today at 1200 (dosing earlier so patient can receive dose prior to discharge).  Lovenox 150mg  SQ q12h until INR 2 or greater.  Daily PT/INR and CBC.  Clance Boll, PharmD, BCPS Pager: [phone] 10/01/2012 10:32 AM

## 2012-10-01 NOTE — Discharge Summary (Signed)
Patient ID: Gregory Cortez MRN: [id number] DOB/AGE: Nov 03, 1956 56 y.o.  Admit date: 09/21/2012 Discharge date: 10/01/2012  Procedures: placement of percutaneous drain for post op abscess  Consults: None  Reason for Admission: Morbidly obese male on chronic anticoagulation for pulmonary embolism. Found to have complicated appendicitis last month. Underwent laparoscopically-assisted appendectomy. Hospitalized. IV antibiotics. Improved. Went home. Noticed a large lump on his right flank. Increasing discomfort. Was concerned. He came to clinic yesterday. I recommended a CAT scan for evaluation to rule out an abscess or probable hematoma. This had to be delayed for a few days. At that time, patient had no nausea or vomiting. Appetite was good. Energy level better. Moving bowels well. No fevers.  Last night, patient noticed temperature of 102. Worsening pain and discomfort. Came to emergency room. Able to do CT scan then. Found to have elevated white count. Probable retroperitoneal abscess. I was called for evaluation.   Admission Diagnoses:  1. S/p lap appy with intra-abdominal abscess Patient Active Problem List  Diagnosis  . HYPOGONADISM  . Anxiety state, unspecified  . SLEEP APNEA, OBSTRUCTIVE  . GERD  . ERECTILE DYSFUNCTION, ORGANIC  . LOW BACK PAIN  . EDEMA LEG  . Chronic anticoagulation for h/o Pulmonary embolism  . Primary hypercoagulable state  . Obesity, Class III, BMI 40-49.9 (morbid obesity)  . Appendicitis with peritonitis s/p lap appy 09/13/2012  . Abscess, retroperitoneal    Hospital Course: The patient was admitted and placed on IV Zosyn.  IR was consulted for placement of a percutaneous abscess drain.  The patient tolerated this well.  His WBC began to normalize.  His coumadin was held at this time as well.  His drain was initially purulent and at one point almost looked feculent, but it cleared out and became more serous.  He had a repeat CT scan after 6 days with the  drain.  This showed complete resolution of the abscess cavity.  His drain was removed prior to dc home.  His cultures revealed pansensitive E. Coli.  He also developed some abdominal wall cellulitis around his drain.  Vancomycin was added to his abx regimen, which helped.  This improved over time.  His coumadin was restarted prior to dc home, but INR was not therapeutic yet.  He was sent home on a Lovenox bridge.  Discharge Diagnoses:  Principal Problem:  *Abscess, retroperitoneal Active Problems:  Anxiety state, unspecified  SLEEP APNEA, OBSTRUCTIVE  GERD  Chronic anticoagulation for h/o Pulmonary embolism  Primary hypercoagulable state  Obesity, Class III, BMI 40-49.9 (morbid obesity)  Appendicitis with peritonitis s/p lap appy 09/13/2012   Discharge Medications:   Medication List     As of 10/01/2012 10:00 AM    TAKE these medications         ALPRAZolam 1 MG tablet   Commonly known as: XANAX   Take 1 mg by mouth 3 (three) times daily as needed. Anxiety.      amoxicillin-clavulanate 875-125 MG per tablet   Commonly known as: AUGMENTIN   Take 1 tablet by mouth 2 (two) times daily.      celecoxib 200 MG capsule   Commonly known as: CELEBREX   Take 200 mg by mouth daily as needed. Pain.      enoxaparin 150 MG/ML injection   Commonly known as: LOVENOX   Inject 1 mL (150 mg total) into the skin every 12 (twelve) hours.      folic acid 1 MG tablet   Commonly known as: [employer]  Take 1 mg by mouth daily.      furosemide 40 MG tablet   Commonly known as: LASIX   Take 40 mg by mouth daily as needed. For increased fluid      MULTI-VITAMIN DAILY PO   Take by mouth every morning.      omeprazole 40 MG capsule   Commonly known as: PRILOSEC   Take 40 mg by mouth daily.      oxyCODONE-acetaminophen 5-325 MG per tablet   Commonly known as: PERCOCET/ROXICET   Take 1-2 tablets by mouth every 4 (four) hours as needed.      polyethylene glycol packet   Commonly known as:  MIRALAX / GLYCOLAX   Take 17 g by mouth daily as needed.      tadalafil 20 MG tablet   Commonly known as: CIALIS   Take 20 mg by mouth daily as needed. Before intercourse.      warfarin 7.5 MG tablet   Commonly known as: COUMADIN   Take 7.5 mg by mouth every Monday, Wednesday, and Friday. Take along with 5mg  tablet to make a total dose of 12.5mg       warfarin 5 MG tablet   Commonly known as: COUMADIN   Take 5-10 mg by mouth daily. Take 1 tablet along with 7.5 tablet on Monday,Wednesday, & Friday to make a total of 12.5mg . Take 2 tablets (10mg ) on all other days.        Discharge Instructions:     Follow-up Information    Follow up with NEWMAN,DAVID H, MD. In 2 weeks.   Contact information:   [address] [place] [address] [phone]          Signed: Letha Cape 10/01/2012, 10:00 AM

## 2012-10-03 ENCOUNTER — Other Ambulatory Visit: Payer: Self-pay | Admitting: *Deleted

## 2012-10-03 DIAGNOSIS — D6859 Other primary thrombophilia: Secondary | ICD-10-CM

## 2012-10-03 DIAGNOSIS — Z7901 Long term (current) use of anticoagulants: Secondary | ICD-10-CM

## 2012-10-03 NOTE — Addendum Note (Signed)
Addended by: Arlan Organ R on: 10/03/2012 12:51 PM   Modules accepted: Orders

## 2012-10-04 ENCOUNTER — Telehealth: Payer: Self-pay | Admitting: Family

## 2012-10-04 NOTE — Telephone Encounter (Signed)
LMTCB

## 2012-10-04 NOTE — Telephone Encounter (Signed)
Please find out if hematology is managing INR. If not, please schedule. Hopefully he is doing well since his appendectomy.

## 2012-10-05 ENCOUNTER — Other Ambulatory Visit: Payer: Self-pay | Admitting: *Deleted

## 2012-10-05 ENCOUNTER — Ambulatory Visit (HOSPITAL_BASED_OUTPATIENT_CLINIC_OR_DEPARTMENT_OTHER): Payer: BC Managed Care – PPO | Admitting: Lab

## 2012-10-05 DIAGNOSIS — I82409 Acute embolism and thrombosis of unspecified deep veins of unspecified lower extremity: Secondary | ICD-10-CM

## 2012-10-05 DIAGNOSIS — I2699 Other pulmonary embolism without acute cor pulmonale: Secondary | ICD-10-CM

## 2012-10-05 LAB — CBC WITH DIFFERENTIAL (CANCER CENTER ONLY)
BASO#: 0.1 10*3/uL (ref 0.0–0.2)
BASO%: 1.5 % (ref 0.0–2.0)
EOS%: 4.1 % (ref 0.0–7.0)
Eosinophils Absolute: 0.2 10*3/uL (ref 0.0–0.5)
HCT: 38.1 % — ABNORMAL LOW (ref 38.7–49.9)
HGB: 12.4 g/dL — ABNORMAL LOW (ref 13.0–17.1)
LYMPH#: 1.2 10*3/uL (ref 0.9–3.3)
LYMPH%: 22.8 % (ref 14.0–48.0)
MCH: 30.2 pg (ref 28.0–33.4)
MCHC: 32.5 g/dL (ref 32.0–35.9)
MCV: 93 fL (ref 82–98)
MONO#: 0.5 10*3/uL (ref 0.1–0.9)
MONO%: 9.3 % (ref 0.0–13.0)
NEUT#: 3.2 10*3/uL (ref 1.5–6.5)
NEUT%: 62.3 % (ref 40.0–80.0)
Platelets: 459 10*3/uL — ABNORMAL HIGH (ref 145–400)
RBC: 4.11 10*6/uL — ABNORMAL LOW (ref 4.20–5.70)
RDW: 14.1 % (ref 11.1–15.7)
WBC: 5.2 10*3/uL (ref 4.0–10.0)

## 2012-10-05 LAB — PROTIME-INR (CHCC SATELLITE)
INR: 2.6 (ref 2.0–3.5)
Protime: 31.2 Seconds — ABNORMAL HIGH (ref 10.6–13.4)

## 2012-10-05 LAB — D-DIMER, QUANTITATIVE (NOT AT ARMC): D-Dimer, Quant: 0.27 ug/mL-FEU (ref 0.00–0.48)

## 2012-10-05 NOTE — Telephone Encounter (Signed)
Appointment schedule for pt/inr

## 2012-10-08 ENCOUNTER — Telehealth: Payer: Self-pay | Admitting: Hematology & Oncology

## 2012-10-08 NOTE — Telephone Encounter (Signed)
Pt aware of 10-25 lab

## 2012-10-09 ENCOUNTER — Ambulatory Visit (INDEPENDENT_AMBULATORY_CARE_PROVIDER_SITE_OTHER): Payer: BC Managed Care – PPO | Admitting: Family

## 2012-10-09 DIAGNOSIS — Z23 Encounter for immunization: Secondary | ICD-10-CM

## 2012-10-09 DIAGNOSIS — I2699 Other pulmonary embolism without acute cor pulmonale: Secondary | ICD-10-CM

## 2012-10-09 DIAGNOSIS — Z7901 Long term (current) use of anticoagulants: Secondary | ICD-10-CM

## 2012-10-09 LAB — POCT INR: INR: 3.6

## 2012-10-09 NOTE — Patient Instructions (Signed)
Hold Coumadin tomorrow. Then continue 12.5 mg on mondays,wednesdays and fridays,10 mg on other days,check in 2  Weeks.   Latest dosing instructions   Total Sun Mon Tue Wed Thu Fri Sat   77.5 10 mg 12.5 mg 10 mg 12.5 mg 10 mg 12.5 mg 10 mg    (5 mg2) (5 mg2.5) (5 mg2) (5 mg2.5) (5 mg2) (5 mg2.5) (5 mg2)

## 2012-10-11 ENCOUNTER — Other Ambulatory Visit: Payer: Self-pay | Admitting: Medical

## 2012-10-12 ENCOUNTER — Ambulatory Visit (HOSPITAL_BASED_OUTPATIENT_CLINIC_OR_DEPARTMENT_OTHER): Payer: BC Managed Care – PPO | Admitting: Lab

## 2012-10-12 DIAGNOSIS — I82409 Acute embolism and thrombosis of unspecified deep veins of unspecified lower extremity: Secondary | ICD-10-CM

## 2012-10-12 LAB — PROTIME-INR (CHCC SATELLITE)
INR: 2.6 (ref 2.0–3.5)
Protime: 31.2 Seconds — ABNORMAL HIGH (ref 10.6–13.4)

## 2012-10-15 ENCOUNTER — Ambulatory Visit (INDEPENDENT_AMBULATORY_CARE_PROVIDER_SITE_OTHER): Payer: BC Managed Care – PPO | Admitting: Surgery

## 2012-10-15 ENCOUNTER — Encounter (INDEPENDENT_AMBULATORY_CARE_PROVIDER_SITE_OTHER): Payer: Self-pay | Admitting: Surgery

## 2012-10-15 VITALS — BP 132/72 | HR 60 | Temp 98.6°F | Resp 18 | Ht 70.0 in | Wt 334.4 lb

## 2012-10-15 DIAGNOSIS — K352 Acute appendicitis with generalized peritonitis, without abscess: Secondary | ICD-10-CM

## 2012-10-15 DIAGNOSIS — K3533 Acute appendicitis with perforation and localized peritonitis, with abscess: Secondary | ICD-10-CM

## 2012-10-15 NOTE — Progress Notes (Signed)
[facility]  Ovidio Kin, MD,  FACS [address] [place]  [address] [place], [place]    [address] Phone:  [phone] FAX:  [phone]   Re:   Gregory Cortez DOB:   07/18/1956 MRN:   [id number]  ASSESSMENT AND PLAN: 1.  Ruptured appendicitis - lap appendectomy - [facility]. [facility] - 09/13/2012  Doing much better, but still very fatigued.  But he had a long post op course and I told him it could take 6 to 12 weeks before he feels like he was before the surgery.  I wrote him a note to be out of work - 10/29/2012.  Return appt PRN.   2.  Post op course complicated with a intra-abdominal abscess  Perc drain 09/23/2012  Removed 10/01/2012 3.  History of PE  On coumadin - PT/INR - 31.2/2.6 - 10/12/2012  Followed by Dr. Ross Marcus 4.  Morbid obesity  BMI - 47.98  He will continue to work on losing weight. 5.  Hiatal hernia on CT scan 6.  His "sciatica" has flared up  Down the back of his right leg 7.  Knot in right abdominal wall  Fat necrosis vs lovenox shot  Should get better with time 8.  Knot post lateral right leg - 2 cm  I don't think this is venous.  Question soft tissue injury.  It is half the size of when he noticed it.  HISTORY OF PRESENT ILLNESS: Chief Complaint  Patient presents with  . Routine Post Op    LAP Appy   Gregory Cortez is a 56 y.o. (DOB: 1956-11-14)  white male who is a patient of FRY,STEPHEN A, MD and comes to me today for follow up of a lap appendectomy complicated by a intra-abdominal abscess.  Doing much better than when I last saw him in the hospital.  His wife is with him.  We talked about fatigue and soreness.  He also showed me two knots that he has - right lower abdomen and right posterior leg.  PHYSICAL EXAM: BP 132/72  Pulse 60  Temp 98.6 F (37 C) (Oral)  Resp 18  Ht 5\' 10"  (1.778 m)  Wt 334 lb 6.4 oz (151.683 kg)  BMI 47.98 kg/m2  Abdomen:  Wounds looks good.  3 cm knot in right panniculus - not infection.  Possibly  secondary to SQ shots. Right leg - posterior and lateral just below popliteal fossa.  Not attached to the skin.  About 2 cm - he says that it is 1/2 the size it was.  DATA REVIEWED: CT scan and labs.  Ovidio Kin, MD, FACS Office:  [phone]

## 2012-10-23 ENCOUNTER — Other Ambulatory Visit: Payer: Self-pay | Admitting: Family Medicine

## 2012-10-24 ENCOUNTER — Ambulatory Visit (INDEPENDENT_AMBULATORY_CARE_PROVIDER_SITE_OTHER): Payer: 59 | Admitting: Family

## 2012-10-24 DIAGNOSIS — Z7901 Long term (current) use of anticoagulants: Secondary | ICD-10-CM

## 2012-10-24 DIAGNOSIS — I2699 Other pulmonary embolism without acute cor pulmonale: Secondary | ICD-10-CM

## 2012-10-24 LAB — POCT INR: INR: 2.4

## 2012-10-24 NOTE — Patient Instructions (Addendum)
Continue 12.5 mg on mondays,wednesdays and fridays,10 mg on other days.    Latest dosing instructions   Total Sun Mon Tue Wed Thu Fri Sat   77.5 10 mg 12.5 mg 10 mg 12.5 mg 10 mg 12.5 mg 10 mg    (5 mg2) (5 mg2.5) (5 mg2) (5 mg2.5) (5 mg2) (5 mg2.5) (5 mg2)        

## 2012-10-24 NOTE — Telephone Encounter (Signed)
Is this the correct dose per day?

## 2012-10-30 ENCOUNTER — Encounter (INDEPENDENT_AMBULATORY_CARE_PROVIDER_SITE_OTHER): Payer: Self-pay

## 2012-11-12 ENCOUNTER — Ambulatory Visit (INDEPENDENT_AMBULATORY_CARE_PROVIDER_SITE_OTHER): Payer: BC Managed Care – PPO | Admitting: Family

## 2012-11-12 DIAGNOSIS — Z7901 Long term (current) use of anticoagulants: Secondary | ICD-10-CM

## 2012-11-12 DIAGNOSIS — I2699 Other pulmonary embolism without acute cor pulmonale: Secondary | ICD-10-CM

## 2012-11-12 LAB — POCT INR: INR: 1.3

## 2012-11-12 MED ORDER — FONDAPARINUX SODIUM 10 MG/0.8ML ~~LOC~~ SOLN: 10.0000 mg | SUBCUTANEOUS | Status: DC

## 2012-11-12 NOTE — Patient Instructions (Addendum)
Resume Coumadin and Lovenox the evening after the procedure. Continue 12.5 mg on mondays,wednesdays and fridays,10 mg on other days. Recheck Dec 2nd    Latest dosing instructions   Total Gregory Cortez Tue Wed Thu Fri Sat   77.5 10 mg 12.5 mg 10 mg 12.5 mg 10 mg 12.5 mg 10 mg    (5 mg2) (5 mg2.5) (5 mg2) (5 mg2.5) (5 mg2) (5 mg2.5) (5 mg2)

## 2012-11-19 ENCOUNTER — Ambulatory Visit (INDEPENDENT_AMBULATORY_CARE_PROVIDER_SITE_OTHER): Payer: BC Managed Care – PPO | Admitting: Family

## 2012-11-19 DIAGNOSIS — Z7901 Long term (current) use of anticoagulants: Secondary | ICD-10-CM

## 2012-11-19 DIAGNOSIS — I2699 Other pulmonary embolism without acute cor pulmonale: Secondary | ICD-10-CM

## 2012-11-19 LAB — POCT INR: INR: 1.8

## 2012-11-19 NOTE — Patient Instructions (Addendum)
Take an extra 1/2 tab today. Continue 12.5 mg on mondays,wednesdays and fridays,10 mg on other days.   Latest dosing instructions   Total Sun Mon Tue Wed Thu Fri Sat   77.5 10 mg 12.5 mg 10 mg 12.5 mg 10 mg 12.5 mg 10 mg    (5 mg2) (5 mg2.5) (5 mg2) (5 mg2.5) (5 mg2) (5 mg2.5) (5 mg2)

## 2012-11-29 ENCOUNTER — Telehealth: Payer: Self-pay | Admitting: Family Medicine

## 2012-11-29 NOTE — Telephone Encounter (Signed)
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 Please assist.

## 2012-11-29 NOTE — Telephone Encounter (Signed)
I would ask Padonda to take care of this since she manages his coumadin

## 2012-11-30 NOTE — Telephone Encounter (Signed)
Note and inr readings printed. Pt to call back with fax number

## 2012-11-30 NOTE — Telephone Encounter (Signed)
Pt states [employer] needs the letter faxed today.  Pt following up.

## 2012-11-30 NOTE — Telephone Encounter (Signed)
Gregory Cortez, the patient called back w/#. Please fax Dr. Claris Che note/letter and the lab results to [phone].

## 2012-11-30 NOTE — Telephone Encounter (Signed)
OK to give note and last 3 INR readings.

## 2012-11-30 NOTE — Telephone Encounter (Signed)
Sent to fax.

## 2012-12-10 ENCOUNTER — Ambulatory Visit (INDEPENDENT_AMBULATORY_CARE_PROVIDER_SITE_OTHER): Payer: BC Managed Care – PPO | Admitting: Family

## 2012-12-10 DIAGNOSIS — R252 Cramp and spasm: Secondary | ICD-10-CM

## 2012-12-10 DIAGNOSIS — R5383 Other fatigue: Secondary | ICD-10-CM

## 2012-12-10 DIAGNOSIS — R5381 Other malaise: Secondary | ICD-10-CM

## 2012-12-10 LAB — CBC WITH DIFFERENTIAL/PLATELET
Basophils Absolute: 0.1 10*3/uL (ref 0.0–0.1)
Basophils Relative: 0.8 % (ref 0.0–3.0)
Eosinophils Absolute: 0.3 10*3/uL (ref 0.0–0.7)
Eosinophils Relative: 4.8 % (ref 0.0–5.0)
HCT: 43.8 % (ref 39.0–52.0)
Hemoglobin: 14.7 g/dL (ref 13.0–17.0)
Lymphocytes Relative: 20.5 % (ref 12.0–46.0)
Lymphs Abs: 1.4 10*3/uL (ref 0.7–4.0)
MCHC: 33.7 g/dL (ref 30.0–36.0)
MCV: 92.3 fl (ref 78.0–100.0)
Monocytes Absolute: 0.5 10*3/uL (ref 0.1–1.0)
Monocytes Relative: 8 % (ref 3.0–12.0)
Neutro Abs: 4.4 10*3/uL (ref 1.4–7.7)
Neutrophils Relative %: 65.9 % (ref 43.0–77.0)
Platelets: 240 10*3/uL (ref 150.0–400.0)
RBC: 4.74 Mil/uL (ref 4.22–5.81)
RDW: 13.7 % (ref 11.5–14.6)
WBC: 6.7 10*3/uL (ref 4.5–10.5)

## 2012-12-10 LAB — COMPREHENSIVE METABOLIC PANEL
ALT: 32 U/L (ref 0–53)
AST: 23 U/L (ref 0–37)
Albumin: 4 g/dL (ref 3.5–5.2)
Alkaline Phosphatase: 85 U/L (ref 39–117)
BUN: 16 mg/dL (ref 6–23)
CO2: 30 mEq/L (ref 19–32)
Calcium: 9.7 mg/dL (ref 8.4–10.5)
Chloride: 103 mEq/L (ref 96–112)
Creatinine, Ser: 1.1 mg/dL (ref 0.4–1.5)
GFR: 76.52 mL/min (ref >60.00)
Glucose, Bld: 96 mg/dL (ref 70–99)
Potassium: 4.3 mEq/L (ref 3.5–5.1)
Sodium: 140 mEq/L (ref 135–145)
Total Bilirubin: 0.6 mg/dL (ref 0.3–1.2)
Total Protein: 7.7 g/dL (ref 6.0–8.3)

## 2012-12-10 LAB — POCT INR: INR: 2.2

## 2012-12-10 LAB — TSH: TSH: 1.34 u[IU]/mL (ref 0.35–5.50)

## 2012-12-10 NOTE — Patient Instructions (Addendum)
Continue 12.5 mg on mondays,wednesdays and fridays,10 mg on other days.    Latest dosing instructions   Total Sun Mon Tue Wed Thu Fri Sat   77.5 10 mg 12.5 mg 10 mg 12.5 mg 10 mg 12.5 mg 10 mg    (5 mg2) (5 mg2.5) (5 mg2) (5 mg2.5) (5 mg2) (5 mg2.5) (5 mg2)

## 2012-12-27 ENCOUNTER — Ambulatory Visit: Payer: BC Managed Care – PPO | Admitting: Cardiovascular Disease

## 2013-01-03 ENCOUNTER — Ambulatory Visit: Payer: Self-pay | Admitting: Medical

## 2013-01-03 ENCOUNTER — Other Ambulatory Visit: Payer: Self-pay | Admitting: Lab

## 2013-01-06 ENCOUNTER — Other Ambulatory Visit: Payer: Self-pay | Admitting: Family Medicine

## 2013-01-07 ENCOUNTER — Encounter: Payer: BC Managed Care – PPO | Admitting: Family

## 2013-01-08 ENCOUNTER — Ambulatory Visit (INDEPENDENT_AMBULATORY_CARE_PROVIDER_SITE_OTHER): Payer: BC Managed Care – PPO | Admitting: Family

## 2013-01-08 DIAGNOSIS — Z7901 Long term (current) use of anticoagulants: Secondary | ICD-10-CM

## 2013-01-08 DIAGNOSIS — I2699 Other pulmonary embolism without acute cor pulmonale: Secondary | ICD-10-CM

## 2013-01-08 LAB — POCT INR: INR: 2.1

## 2013-01-08 NOTE — Patient Instructions (Addendum)
Continue 12.5 mg on mondays,wednesdays and fridays,10 mg on other day   Latest dosing instructions   Total Sun Mon Tue Wed Thu Fri Sat   77.5 10 mg 12.5 mg 10 mg 12.5 mg 10 mg 12.5 mg 10 mg    (5 mg2) (5 mg2.5) (5 mg2) (5 mg2.5) (5 mg2) (5 mg2.5) (5 mg2)

## 2013-01-15 ENCOUNTER — Other Ambulatory Visit: Payer: Self-pay | Admitting: Family Medicine

## 2013-01-18 ENCOUNTER — Encounter: Payer: BC Managed Care – PPO | Admitting: Family Medicine

## 2013-01-18 ENCOUNTER — Encounter: Payer: BC Managed Care – PPO | Admitting: Cardiovascular Disease

## 2013-01-18 ENCOUNTER — Ambulatory Visit (INDEPENDENT_AMBULATORY_CARE_PROVIDER_SITE_OTHER): Payer: BC Managed Care – PPO | Admitting: Family Medicine

## 2013-01-18 ENCOUNTER — Encounter: Payer: Self-pay | Admitting: Family Medicine

## 2013-01-18 VITALS — BP 118/70 | HR 100 | Temp 98.4°F

## 2013-01-18 DIAGNOSIS — H6690 Otitis media, unspecified, unspecified ear: Secondary | ICD-10-CM

## 2013-01-18 MED ORDER — AMOXICILLIN-POT CLAVULANATE 875-125 MG PO TABS: 1.0000 | ORAL_TABLET | Freq: Two times a day (BID) | ORAL | Status: DC

## 2013-01-18 NOTE — Progress Notes (Signed)
  Subjective:    Patient ID: Gregory Cortez, male    DOB: 1956/01/13, 57 y.o.   MRN: [id number]  HPI Here for 2 days of pain in the left ear. No fever or ST or cough. Using Tylenol.    Review of Systems  Constitutional: Negative.   HENT: Positive for ear pain. Negative for hearing loss, congestion, tinnitus and ear discharge.   Eyes: Negative.   Respiratory: Negative.        Objective:   Physical Exam  Constitutional: He appears well-developed and well-nourished.  HENT:  Right Ear: External ear normal.  Nose: Nose normal.  Mouth/Throat: Oropharynx is clear and moist.       Left TM is red and dull   Eyes: Conjunctivae normal are normal.  Neck: No thyromegaly present.  Pulmonary/Chest: Effort normal and breath sounds normal.  Lymphadenopathy:    He has no cervical adenopathy.          Assessment & Plan:  Recheck prn

## 2013-01-18 NOTE — Progress Notes (Signed)
Late Cancel  This encounter was created in error - please disregard.

## 2013-01-18 NOTE — Progress Notes (Signed)
Pt did not show for appt. cdm  

## 2013-02-04 ENCOUNTER — Ambulatory Visit (INDEPENDENT_AMBULATORY_CARE_PROVIDER_SITE_OTHER): Payer: BC Managed Care – PPO | Admitting: Family

## 2013-02-04 DIAGNOSIS — Z7901 Long term (current) use of anticoagulants: Secondary | ICD-10-CM

## 2013-02-04 DIAGNOSIS — I2699 Other pulmonary embolism without acute cor pulmonale: Secondary | ICD-10-CM

## 2013-02-04 LAB — POCT INR: INR: 2.6

## 2013-02-04 NOTE — Patient Instructions (Addendum)
Continue 12.5 mg on mondays,wednesdays and fridays,10 mg on other days  Anticoagulation Dose Instructions as of 02/04/2013     Glynis Smiles Tue Wed Thu Fri Sat   New Dose 10 mg 12.5 mg 10 mg 12.5 mg 10 mg 12.5 mg 10 mg    Description       Continue 12.5 mg on mondays,wednesdays and fridays,10 mg on other days.

## 2013-02-12 ENCOUNTER — Other Ambulatory Visit: Payer: Self-pay

## 2013-02-12 MED ORDER — WARFARIN SODIUM 7.5 MG PO TABS: 10.0000 mg | ORAL_TABLET | Freq: Every day | ORAL | Status: DC

## 2013-02-26 ENCOUNTER — Encounter: Payer: Self-pay | Admitting: Cardiovascular Disease

## 2013-03-04 ENCOUNTER — Other Ambulatory Visit: Payer: Self-pay | Admitting: Family Medicine

## 2013-03-26 ENCOUNTER — Telehealth: Payer: Self-pay | Admitting: Family

## 2013-03-26 ENCOUNTER — Ambulatory Visit (INDEPENDENT_AMBULATORY_CARE_PROVIDER_SITE_OTHER): Payer: BC Managed Care – PPO | Admitting: Family

## 2013-03-26 DIAGNOSIS — I2699 Other pulmonary embolism without acute cor pulmonale: Secondary | ICD-10-CM

## 2013-03-26 LAB — POCT INR: INR: 2

## 2013-03-26 NOTE — Patient Instructions (Addendum)
Continue 12.5 mg on mondays,wednesdays and fridays,10 mg on other days. Recheck 6 weeks.   Anticoagulation Dose Instructions as of 03/26/2013     Glynis Smiles Tue Wed Thu Fri Sat   New Dose 10 mg 12.5 mg 10 mg 12.5 mg 10 mg 12.5 mg 10 mg    Description       Continue 12.5 mg on mondays,wednesdays and fridays,10 mg on other days. Recheck 6 weeks.

## 2013-03-26 NOTE — Telephone Encounter (Signed)
Coming in at 1pm

## 2013-03-26 NOTE — Telephone Encounter (Signed)
Needs an INR check asap!! 

## 2013-04-08 ENCOUNTER — Encounter: Payer: Self-pay | Admitting: Family

## 2013-05-07 ENCOUNTER — Encounter: Payer: BC Managed Care – PPO | Admitting: Family

## 2013-05-14 ENCOUNTER — Telehealth: Payer: Self-pay | Admitting: Family

## 2013-05-14 NOTE — Telephone Encounter (Signed)
Needs INR checked asap!! 

## 2013-05-15 ENCOUNTER — Other Ambulatory Visit: Payer: Self-pay | Admitting: Family

## 2013-05-15 ENCOUNTER — Ambulatory Visit (INDEPENDENT_AMBULATORY_CARE_PROVIDER_SITE_OTHER): Payer: BC Managed Care – PPO | Admitting: Family

## 2013-05-15 DIAGNOSIS — I2699 Other pulmonary embolism without acute cor pulmonale: Secondary | ICD-10-CM

## 2013-05-15 LAB — COMPREHENSIVE METABOLIC PANEL
ALT: 26 U/L (ref 0–53)
AST: 22 U/L (ref 0–37)
Albumin: 3.7 g/dL (ref 3.5–5.2)
Alkaline Phosphatase: 77 U/L (ref 39–117)
BUN: 17 mg/dL (ref 6–23)
CO2: 27 mEq/L (ref 19–32)
Calcium: 8.9 mg/dL (ref 8.4–10.5)
Chloride: 106 mEq/L (ref 96–112)
Creatinine, Ser: 0.8 mg/dL (ref 0.4–1.5)
GFR: 102.42 mL/min (ref >60.00)
Glucose, Bld: 99 mg/dL (ref 70–99)
Potassium: 4 mEq/L (ref 3.5–5.1)
Sodium: 138 mEq/L (ref 135–145)
Total Bilirubin: 0.6 mg/dL (ref 0.3–1.2)
Total Protein: 7.4 g/dL (ref 6.0–8.3)

## 2013-05-15 LAB — CBC WITH DIFFERENTIAL/PLATELET
Basophils Absolute: 0 10*3/uL (ref 0.0–0.1)
Basophils Relative: 0.7 % (ref 0.0–3.0)
Eosinophils Absolute: 0.4 10*3/uL (ref 0.0–0.7)
Eosinophils Relative: 6.5 % — ABNORMAL HIGH (ref 0.0–5.0)
HCT: 42.7 % (ref 39.0–52.0)
Hemoglobin: 14.2 g/dL (ref 13.0–17.0)
Lymphocytes Relative: 21.5 % (ref 12.0–46.0)
Lymphs Abs: 1.2 10*3/uL (ref 0.7–4.0)
MCHC: 33.3 g/dL (ref 30.0–36.0)
MCV: 93.7 fl (ref 78.0–100.0)
Monocytes Absolute: 0.6 10*3/uL (ref 0.1–1.0)
Monocytes Relative: 10.5 % (ref 3.0–12.0)
Neutro Abs: 3.3 10*3/uL (ref 1.4–7.7)
Neutrophils Relative %: 60.8 % (ref 43.0–77.0)
Platelets: 233 10*3/uL (ref 150.0–400.0)
RBC: 4.55 Mil/uL (ref 4.22–5.81)
RDW: 13.6 % (ref 11.5–14.6)
WBC: 5.4 10*3/uL (ref 4.5–10.5)

## 2013-05-15 LAB — POCT INR: INR: 2.8

## 2013-05-15 MED ORDER — RIVAROXABAN 20 MG PO TABS: 20.0000 mg | ORAL_TABLET | Freq: Every day | ORAL | Status: DC

## 2013-05-15 NOTE — Patient Instructions (Signed)
Hold coumadin x 2 days. Then begin Xarelto  20mg  once day with largest meal.

## 2013-06-03 ENCOUNTER — Telehealth: Payer: Self-pay | Admitting: Family Medicine

## 2013-06-03 MED ORDER — OMEPRAZOLE 40 MG PO CPDR: 40.0000 mg | DELAYED_RELEASE_CAPSULE | Freq: Every day | ORAL | Status: DC

## 2013-06-03 NOTE — Telephone Encounter (Signed)
Refill request for Omeprazole 40 mg take 1 po qd and I did send script e-scribe.

## 2013-06-06 ENCOUNTER — Other Ambulatory Visit (INDEPENDENT_AMBULATORY_CARE_PROVIDER_SITE_OTHER): Payer: BC Managed Care – PPO

## 2013-06-06 DIAGNOSIS — I2699 Other pulmonary embolism without acute cor pulmonale: Secondary | ICD-10-CM

## 2013-06-06 LAB — COMPREHENSIVE METABOLIC PANEL
ALT: 24 U/L (ref 0–53)
AST: 20 U/L (ref 0–37)
Albumin: 3.7 g/dL (ref 3.5–5.2)
Alkaline Phosphatase: 78 U/L (ref 39–117)
BUN: 15 mg/dL (ref 6–23)
CO2: 25 mEq/L (ref 19–32)
Calcium: 9.1 mg/dL (ref 8.4–10.5)
Chloride: 107 mEq/L (ref 96–112)
Creatinine, Ser: 1 mg/dL (ref 0.4–1.5)
GFR: 86.57 mL/min (ref >60.00)
Glucose, Bld: 104 mg/dL — ABNORMAL HIGH (ref 70–99)
Potassium: 3.9 mEq/L (ref 3.5–5.1)
Sodium: 138 mEq/L (ref 135–145)
Total Bilirubin: 0.5 mg/dL (ref 0.3–1.2)
Total Protein: 7.5 g/dL (ref 6.0–8.3)

## 2013-06-07 LAB — CBC WITH DIFFERENTIAL/PLATELET
Basophils Absolute: 0 10*3/uL (ref 0.0–0.1)
Basophils Relative: 0.3 % (ref 0.0–3.0)
Eosinophils Absolute: 0.4 10*3/uL (ref 0.0–0.7)
Eosinophils Relative: 7.1 % — ABNORMAL HIGH (ref 0.0–5.0)
HCT: 41.6 % (ref 39.0–52.0)
Hemoglobin: 14 g/dL (ref 13.0–17.0)
Lymphocytes Relative: 19.7 % (ref 12.0–46.0)
Lymphs Abs: 1.1 10*3/uL (ref 0.7–4.0)
MCHC: 33.6 g/dL (ref 30.0–36.0)
MCV: 94.2 fl (ref 78.0–100.0)
Monocytes Absolute: 0.5 10*3/uL (ref 0.1–1.0)
Monocytes Relative: 8.3 % (ref 3.0–12.0)
Neutro Abs: 3.8 10*3/uL (ref 1.4–7.7)
Neutrophils Relative %: 64.6 % (ref 43.0–77.0)
Platelets: 238 10*3/uL (ref 150.0–400.0)
RBC: 4.41 Mil/uL (ref 4.22–5.81)
RDW: 13.7 % (ref 11.5–14.6)
WBC: 5.9 10*3/uL (ref 4.5–10.5)

## 2013-07-04 ENCOUNTER — Telehealth: Payer: Self-pay | Admitting: Family Medicine

## 2013-07-04 NOTE — Telephone Encounter (Signed)
FYI

## 2013-07-04 NOTE — Telephone Encounter (Signed)
°  Patient Information:  Caller Name: Kimmie  Phone: [phone]  Patient: Gregory Cortez, Gregory Cortez  Gender: Male  DOB: 1956-11-12  Age: 57 Years  PCP: [name])  Office Follow Up:  Does the office need to follow up with this patient?: No  Instructions For The Office: N/A  RN Note:  Moderate fatigue if sits still;  says he could fall aslep while triager is typing.  Reports sleeping well getting 8 hours of sleep per night.  Uses CPAP. Reports intermittent tachycardia and palpitation in past week but not present now. Currently at work; reports its difficult to stay awake while talking with triager; asking about how much longer to complete triage questions because he has to return to work. Per Web MD, serious Xarelto side effects include unusual fatigue.  Symptoms  Reason For Call & Symptoms: Tired/fatigue.  Reported fatigue began within one week after MD changed Rx from Coumadin to Xarelto.  Reviewed Health History In EMR: Yes  Reviewed Medications In EMR: Yes  Reviewed Allergies In EMR: Yes  Reviewed Surgeries / Procedures: Yes  Date of Onset of Symptoms: 05/22/2013  Guideline(s) Used:  Weakness (Generalized) and Fatigue  Disposition Per Guideline:   Go to ED Now (or to Office with PCP Approval)  Reason For Disposition Reached:   Patient sounds very sick or weak to the triager  Advice Given:  N/A  RN Overrode Recommendation:  Go To [facility]  No appointments remain in office and Dr Clent Ridges is gone so advised to go to [facility] or ED now.

## 2013-07-05 ENCOUNTER — Encounter: Payer: Self-pay | Admitting: Family Medicine

## 2013-07-05 ENCOUNTER — Ambulatory Visit (INDEPENDENT_AMBULATORY_CARE_PROVIDER_SITE_OTHER): Payer: BC Managed Care – PPO | Admitting: Family Medicine

## 2013-07-05 VITALS — BP 100/58 | HR 75 | Temp 98.2°F | Wt 352.0 lb

## 2013-07-05 DIAGNOSIS — IMO0001 Reserved for inherently not codable concepts without codable children: Secondary | ICD-10-CM

## 2013-07-05 DIAGNOSIS — R5381 Other malaise: Secondary | ICD-10-CM

## 2013-07-05 DIAGNOSIS — R252 Cramp and spasm: Secondary | ICD-10-CM

## 2013-07-05 DIAGNOSIS — Z7901 Long term (current) use of anticoagulants: Secondary | ICD-10-CM

## 2013-07-05 DIAGNOSIS — R5383 Other fatigue: Secondary | ICD-10-CM

## 2013-07-05 LAB — SEDIMENTATION RATE: Sed Rate: 39 mm/hr — ABNORMAL HIGH (ref 0–22)

## 2013-07-05 NOTE — Patient Instructions (Addendum)
Increase fluid intake. Try to lose some weight.

## 2013-07-05 NOTE — Progress Notes (Signed)
Subjective:    Patient ID: Gregory Cortez, male    DOB: 10/24/1956, 57 y.o.   MRN: [id number]  HPI Acute visit Patient is seen with nonspecific symptoms over the past couple of weeks of some lightheadedness and mostly increased fatigue. Had some intermittent muscle cramps mostly lower extremities. Chronic problems include history of obesity, obstructive sleep apnea, history of pulmonary embolus, GERD.  Recently was transitioned from Coumadin to Xarelto.  Patient thought Xarelto might have caused some of his dizziness or fatigue  He has obstructive sleep apnea and uses CPAP regularly. Had TSH back in December 2013 which was normal. Denies any recent chest pains. No dyspnea above baseline. No orthopnea. No increased peripheral edema. Appetite and weight are stable.  Current medications reviewed.  Past Medical History  Diagnosis Date  . OSA (obstructive sleep apnea)     dr Shelle Iron  . Testosterone deficiency     dr Patsi Sears, shots every 2 weeks  . ED (erectile dysfunction)   . GERD (gastroesophageal reflux disease)     [facility]  . Low back pain     dr Channing Mutters, dr Farris Has, dr Ethelene Hal, herniated disc L4-5  . PE (pulmonary embolism)     bilateral sep 2011 and again bilateral May 2012  . 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 Primary hypercoagulable state     no etiology found per Dr. Shirline Frees   . Hx of colonoscopy   . Pulmonary embolism 02/22/2010    CT angio Dx   Past Surgical History  Procedure Laterality Date  . Knee surgery      both  . Shoulder surgery      right and left  . Esi      L4-5 dr Channing Mutters 03/2010  . Rotator cuff repair  08-20-10    left dr Wyline Mood complicated by PE  . Esophagogastroduodenoscopy      x2 - normal except reflux  . Laparoscopic appendectomy  09/13/2012    Procedure: APPENDECTOMY LAPAROSCOPIC;  Surgeon: Almond Lint, MD;  Location: [facility] ORS;  Service: General;  Laterality: N/A;    reports that he has never smoked. He has never used smokeless tobacco. He reports  that he does not drink alcohol or use illicit drugs. family history includes Cancer in his other and Hypertension in his other.  There is no history of Coronary artery disease. No Known Allergies    Review of Systems  Constitutional: Positive for fatigue. Negative for appetite change and unexpected weight change.  Respiratory: Negative for cough.   Cardiovascular: Negative for chest pain, palpitations and leg swelling.  Gastrointestinal: Negative for abdominal pain.  Genitourinary: Negative for dysuria.  Neurological: Positive for dizziness and light-headedness. Negative for syncope and weakness.  Hematological: Negative for adenopathy. Does not bruise/bleed easily.  Psychiatric/Behavioral: Negative for dysphoric mood.       Objective:   Physical Exam  Constitutional: He is oriented to person, place, and time. He appears well-developed and well-nourished.  Neck: Neck supple. No thyromegaly present.  Cardiovascular: Normal rate.   Pulmonary/Chest: Effort normal and breath sounds normal. No respiratory distress. He has no wheezes. He has no rales.  Musculoskeletal: He exhibits no edema.  Neurological: He is alert and oriented to person, place, and time.  Psychiatric: He has a normal mood and affect. His behavior is normal. Thought content normal.          Assessment & Plan:  #1 fatigue. Likely multifactorial. He has obstructive sleep apnea and uses CPAP  regularly. No recent regular exercise. Needs to lose some weight. Check labs - TSH and basic metabolic panel #2 history of pulmonary embolus on chronic anticoagulation. Continue Xarelto.  We explained this generally should not cause any fatigue issues #3 intermittent leg cramps. Increased water consumption. Check basic metabolic panel and magnesium levels.

## 2013-07-08 LAB — BASIC METABOLIC PANEL
BUN: 15 mg/dL (ref 6–23)
CO2: 24 mEq/L (ref 19–32)
Calcium: 9.5 mg/dL (ref 8.4–10.5)
Chloride: 106 mEq/L (ref 96–112)
Creatinine, Ser: 1 mg/dL (ref 0.4–1.5)
GFR: 83.52 mL/min (ref >60.00)
Glucose, Bld: 101 mg/dL — ABNORMAL HIGH (ref 70–99)
Potassium: 4.2 mEq/L (ref 3.5–5.1)
Sodium: 140 mEq/L (ref 135–145)

## 2013-07-08 LAB — MAGNESIUM: Magnesium: 1.8 mg/dL (ref 1.5–2.5)

## 2013-07-08 LAB — TSH: TSH: 2.44 u[IU]/mL (ref 0.35–5.50)

## 2013-07-09 ENCOUNTER — Telehealth: Payer: Self-pay | Admitting: Cardiovascular Disease

## 2013-07-09 NOTE — Telephone Encounter (Signed)
Patient called because he think his CPAP machine is not working properly because he feels  fatigue, dizziness, tightness in chest, and he has problems sleeping during the night. Pt was made aware that he needs to call the Pulmonary Md Dr. Eliezer Lofts Md; according pt's documents, he order the CPAP and settings. Pt verbalized understanding.

## 2013-07-09 NOTE — Telephone Encounter (Signed)
New Prob     Pt thinks his CPAP machine may not be working anymore. C/o of fatigue, dizziness, tightness in chest, SOB. Please call.

## 2013-07-10 ENCOUNTER — Ambulatory Visit (INDEPENDENT_AMBULATORY_CARE_PROVIDER_SITE_OTHER): Payer: BC Managed Care – PPO | Admitting: Pulmonary Disease

## 2013-07-10 ENCOUNTER — Encounter: Payer: Self-pay | Admitting: Pulmonary Disease

## 2013-07-10 VITALS — BP 140/96 | HR 83 | Temp 99.5°F | Ht 70.5 in | Wt 355.6 lb

## 2013-07-10 DIAGNOSIS — G4733 Obstructive sleep apnea (adult) (pediatric): Secondary | ICD-10-CM

## 2013-07-10 NOTE — Patient Instructions (Addendum)
Will have your DME check your machine, and make sure it is working properly. Make sure that your mask/cushions are up to date. Will put your machine on the auto setting for the next 2 weeks, and re-optimize your pressure.  I will let you know the results once the download is available.  Work on weight loss. followup with me in one year.

## 2013-07-10 NOTE — Assessment & Plan Note (Signed)
The patient has been wearing CPAP compliantly, and keeping up with his mask changes and supplies.  Despite this, he is having worsening quality of sleep and increasing daytime sleepiness.  At this point, it is unclear whether this could be a machine issue, a leak in the system, increasing pressure needs, or not related to his sleep apnea in any way.  At this point I would like to have his machine checked by his medical equipment company, and will also optimize his pressure again on the automatic setting.  I have encouraged him to work aggressively on weight loss.

## 2013-07-10 NOTE — Progress Notes (Signed)
  Subjective:    Patient ID: Gregory Cortez, male    DOB: 1956/04/15, 57 y.o.   MRN: [id number]  HPI The patient comes in today for followup of his obstructive sleep apnea.  He has not been seen in 3 years, and is complaining that his sleep quality has significantly decreased, and he is now having daytime sleepiness.  He is not sure this is related to his sleep apnea, or something else.  He has a new CPAP machine from 3 years ago, and tells me that he has kept up with his mask changes and supplies.  He feels his device is working properly.  His weight has been up and down over the years, and according to our records he is about 7 pounds heavier from the last visit.   Review of Systems  Constitutional: Positive for fatigue. Negative for fever and unexpected weight change.  HENT: Negative for ear pain, nosebleeds, congestion, sore throat, rhinorrhea, sneezing, trouble swallowing, dental problem, postnasal drip and sinus pressure.   Eyes: Negative for redness and itching.  Respiratory: Positive for chest tightness. Negative for cough, shortness of breath and wheezing.   Cardiovascular: Negative for palpitations and leg swelling.  Gastrointestinal: Negative for nausea and vomiting.  Genitourinary: Negative for dysuria.  Musculoskeletal: Negative for joint swelling.  Skin: Negative for rash.  Neurological: Positive for dizziness, light-headedness and headaches ( migraines).  Hematological: Does not bruise/bleed easily.  Psychiatric/Behavioral: Negative for dysphoric mood. The patient is not nervous/anxious.        Objective:   Physical Exam Morbidly obese male no acute distress Nose without purulent discharge noted No skin breakdown or pressure necrosis from the CPAP mask Neck without lymphadenopathy or thyromegaly Chest clear to auscultation. Lower extremities without edema, no cyanosis Alert and oriented, moves all 4 extremities.       Assessment & Plan:

## 2013-08-07 IMAGING — CT CT ANGIO CHEST
2 of 6 series · 19 of 46 positions shown · IV contrast (APPLIED)
Comparison: Chest radiographs obtained earlier today and previous
chest CTA examinations, the most recent dated 04/25/2011.

CLINICAL DATA: Right chest and back pleuritic chest pain.  History
of pulmonary embolism in the past.  Shortness of breath.

CT ANGIOGRAPHY CHEST WITH CONTRAST
TECHNIQUE: Multidetector CT imaging of the chest was performed
using the standard protocol during bolus administration of
intravenous contrast.  Multiplanar CT image reconstructions
including MIPs were obtained to evaluate the vascular anatomy.
Contrast: 100mL OMNIPAQUE IOHEXOL 350 MG/ML IV SOLN

[Series 8: pulm embolism 1.0 b25f thin · axial · 0.95mm/px · z∈[-309,-36]mm · 16 of 301 slices shown]
[im 14/301  lung]
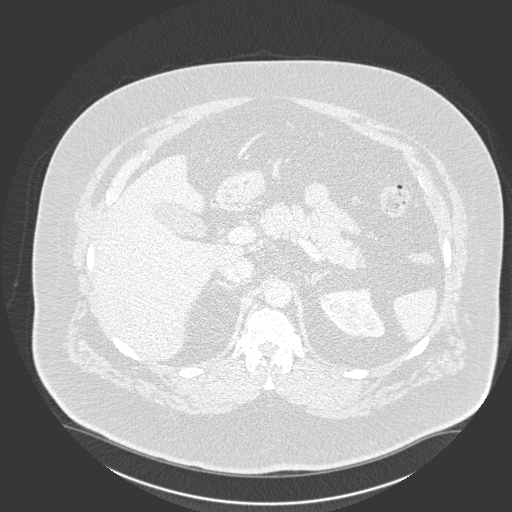
[im 40/301  soft-tissue]
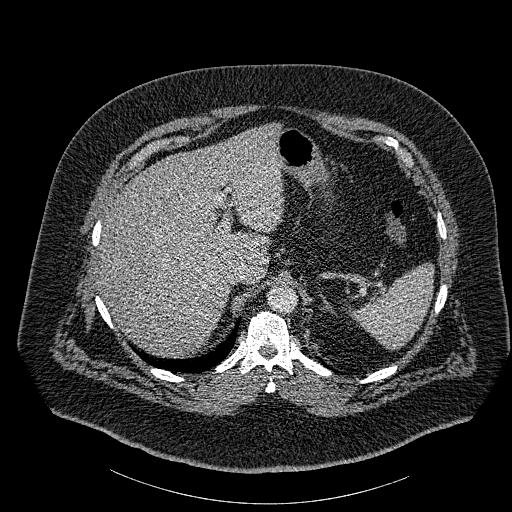
[im 53/301  lung]
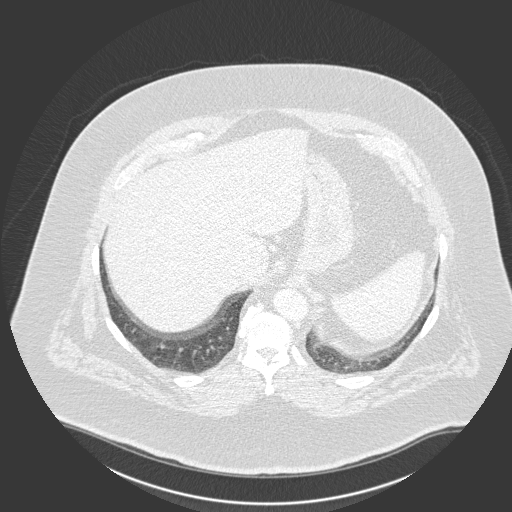
[im 66/301  soft-tissue]
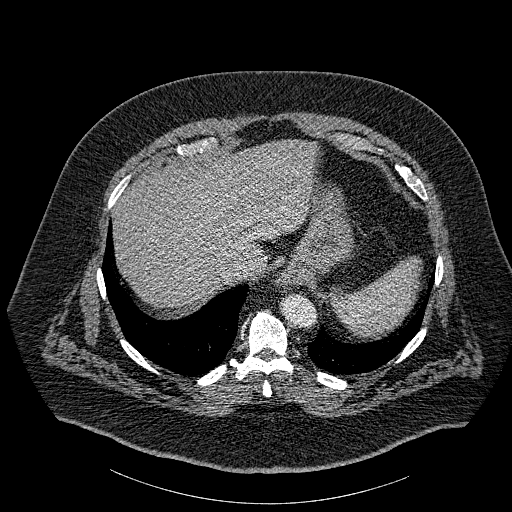
[im 92/301  lung]
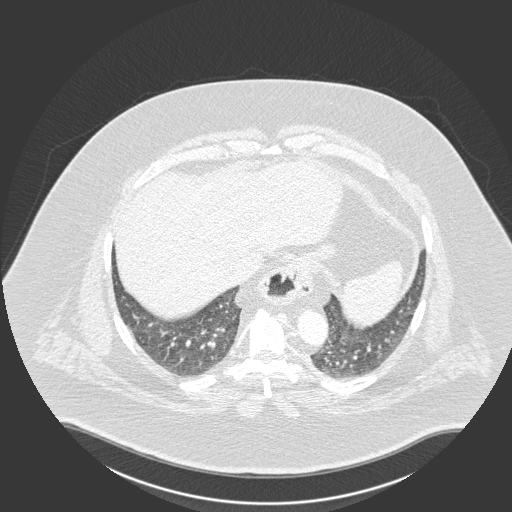
[im 105/301  soft-tissue]
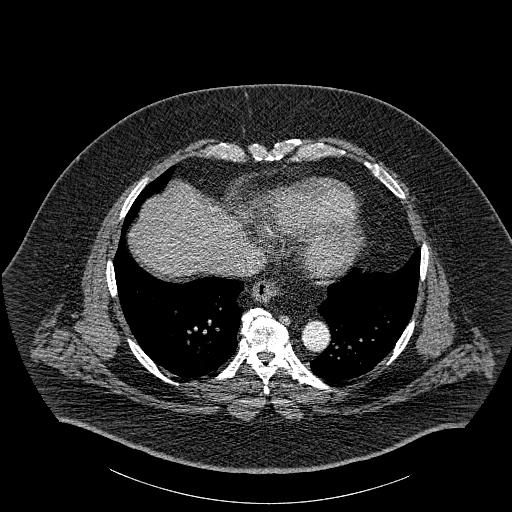
[im 118/301  lung]
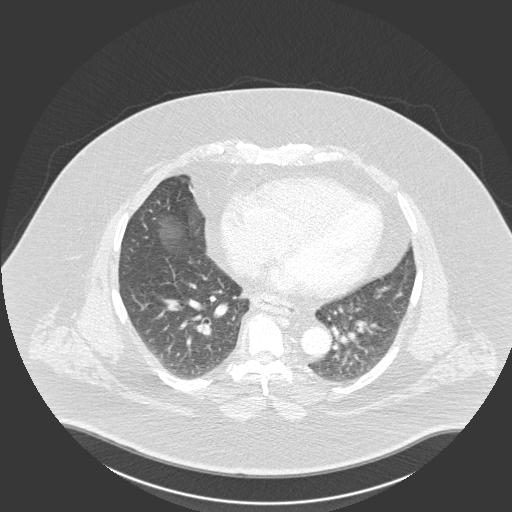
[im 144/301  soft-tissue]
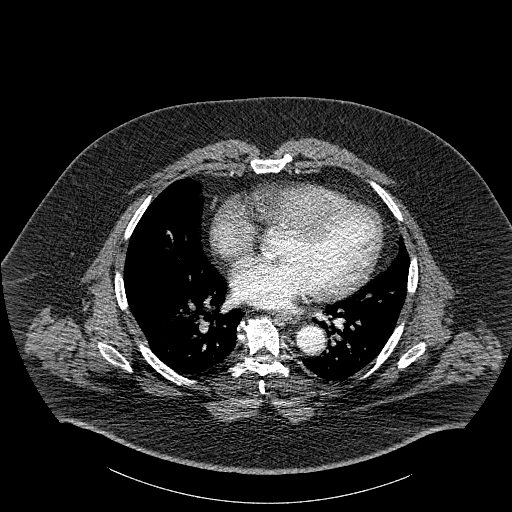
[im 157/301  lung]
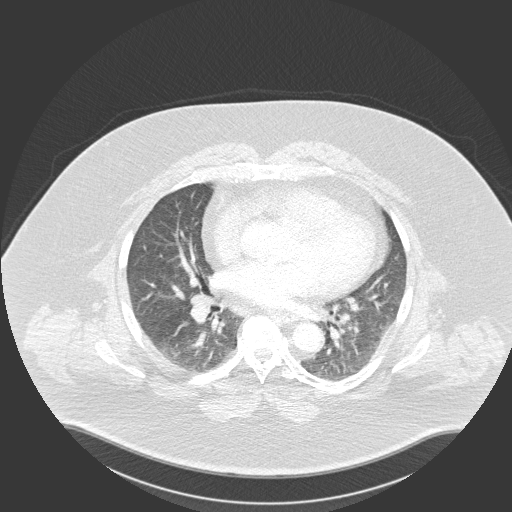
[im 183/301  soft-tissue]
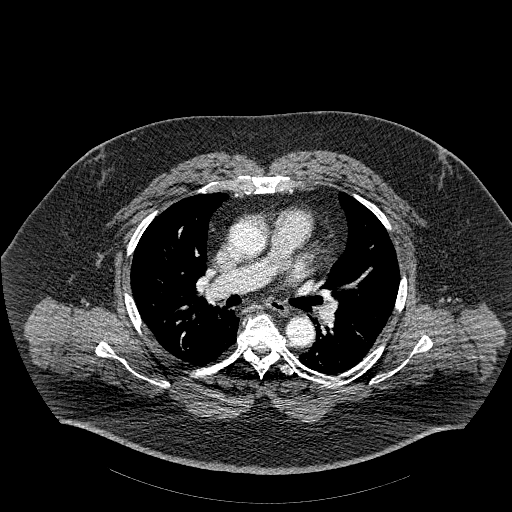
[im 196/301  lung]
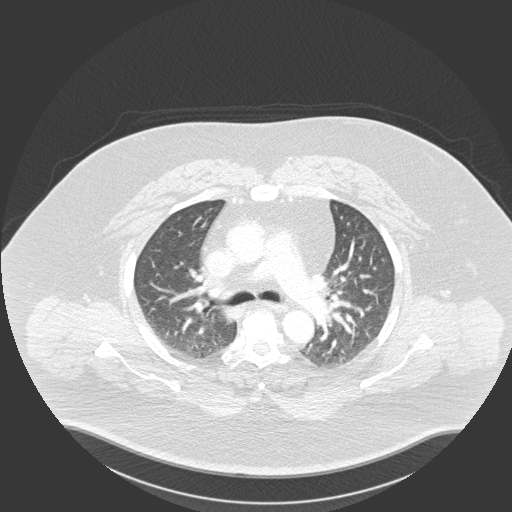
[im 209/301  soft-tissue]
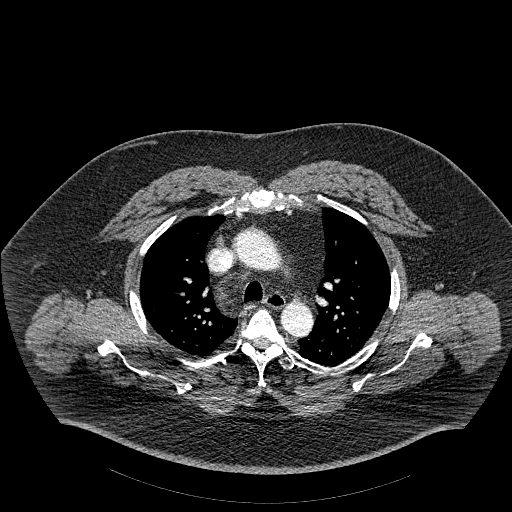
[im 235/301  lung]
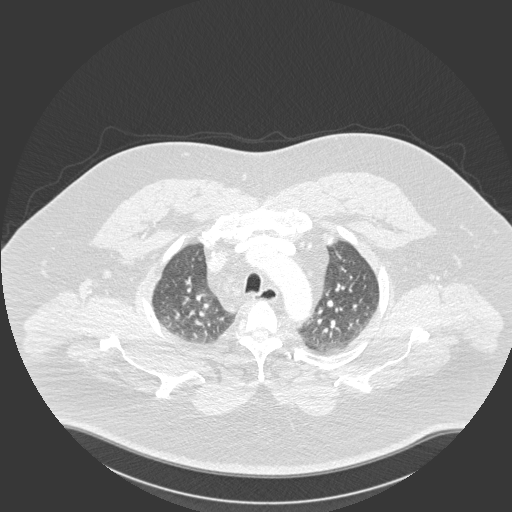
[im 248/301  soft-tissue]
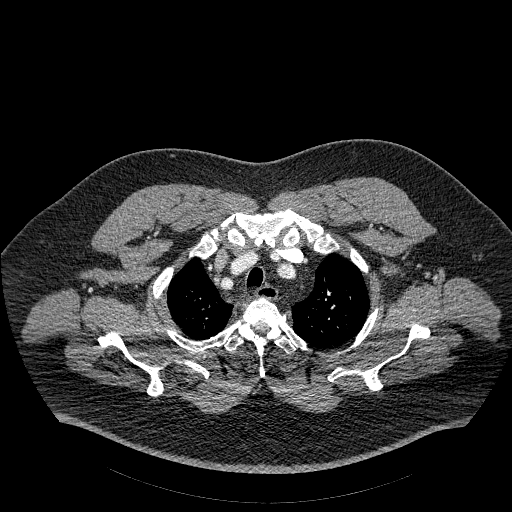
[im 261/301  lung]
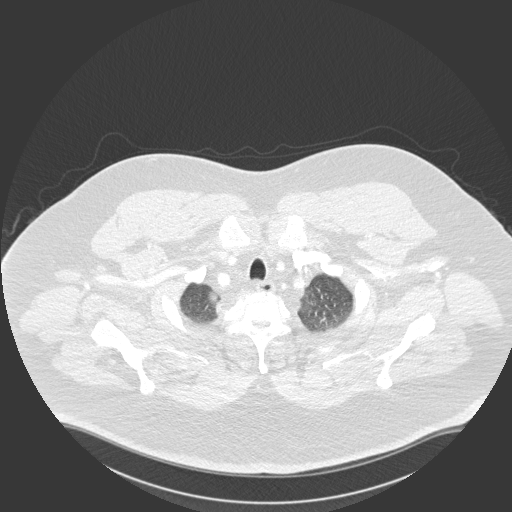
[im 287/301  soft-tissue]
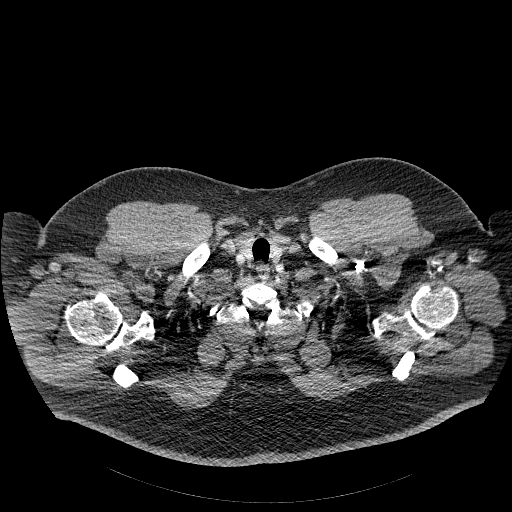

[Series 602: <mpr coronals · coronal · 0.95mm/px · 3 of 127 slices shown]
[im 32/127  soft-tissue]
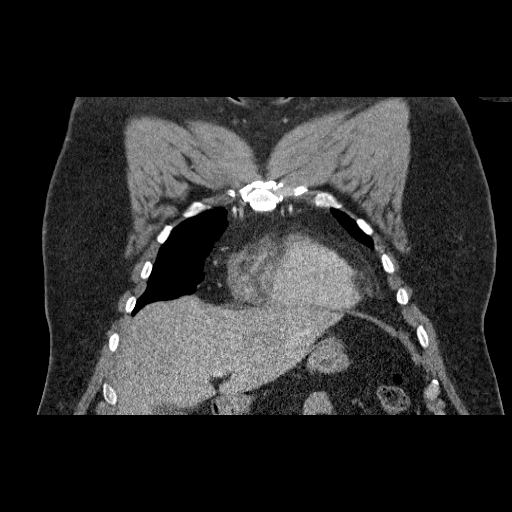
[im 64/127  soft-tissue]
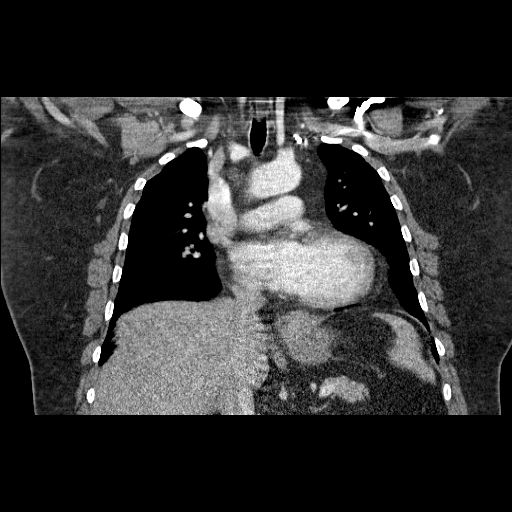
[im 95/127  soft-tissue]
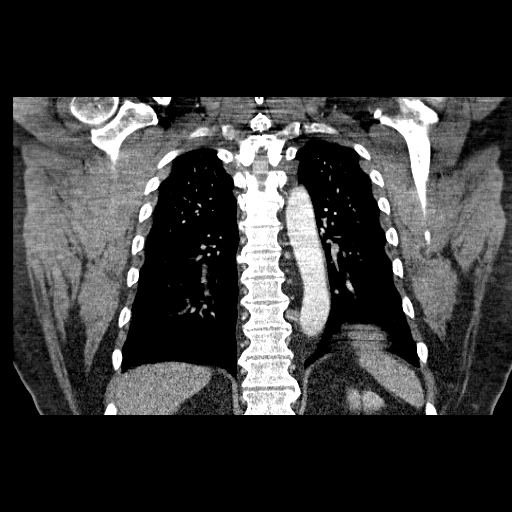

[19 of 46 positions shown; findings below may reference images not displayed]

FINDINGS: The pulmonary arteries are suboptimally opacified.  No
pulmonary arterial filling defects are seen at this time.  The
lungs are clear.  A moderate-sized hiatal hernia is again
demonstrated.  Unremarkable upper abdomen.  Thoracic spine
degenerative changes.

Review of the MIP images confirms the above findings.
IMPRESSION: 1.  Suboptimally opacified pulmonary arteries with no gross
pulmonary emboli seen.
2.  Stable moderate sized hiatal hernia.

## 2013-08-07 IMAGING — CR DG CHEST 2V
2 series · 2 of 2 positions shown · non-contrast
Comparison: 08/25/2010

CLINICAL DATA: Normal chest pain

CHEST - 2 VIEW

[w chest pa *]
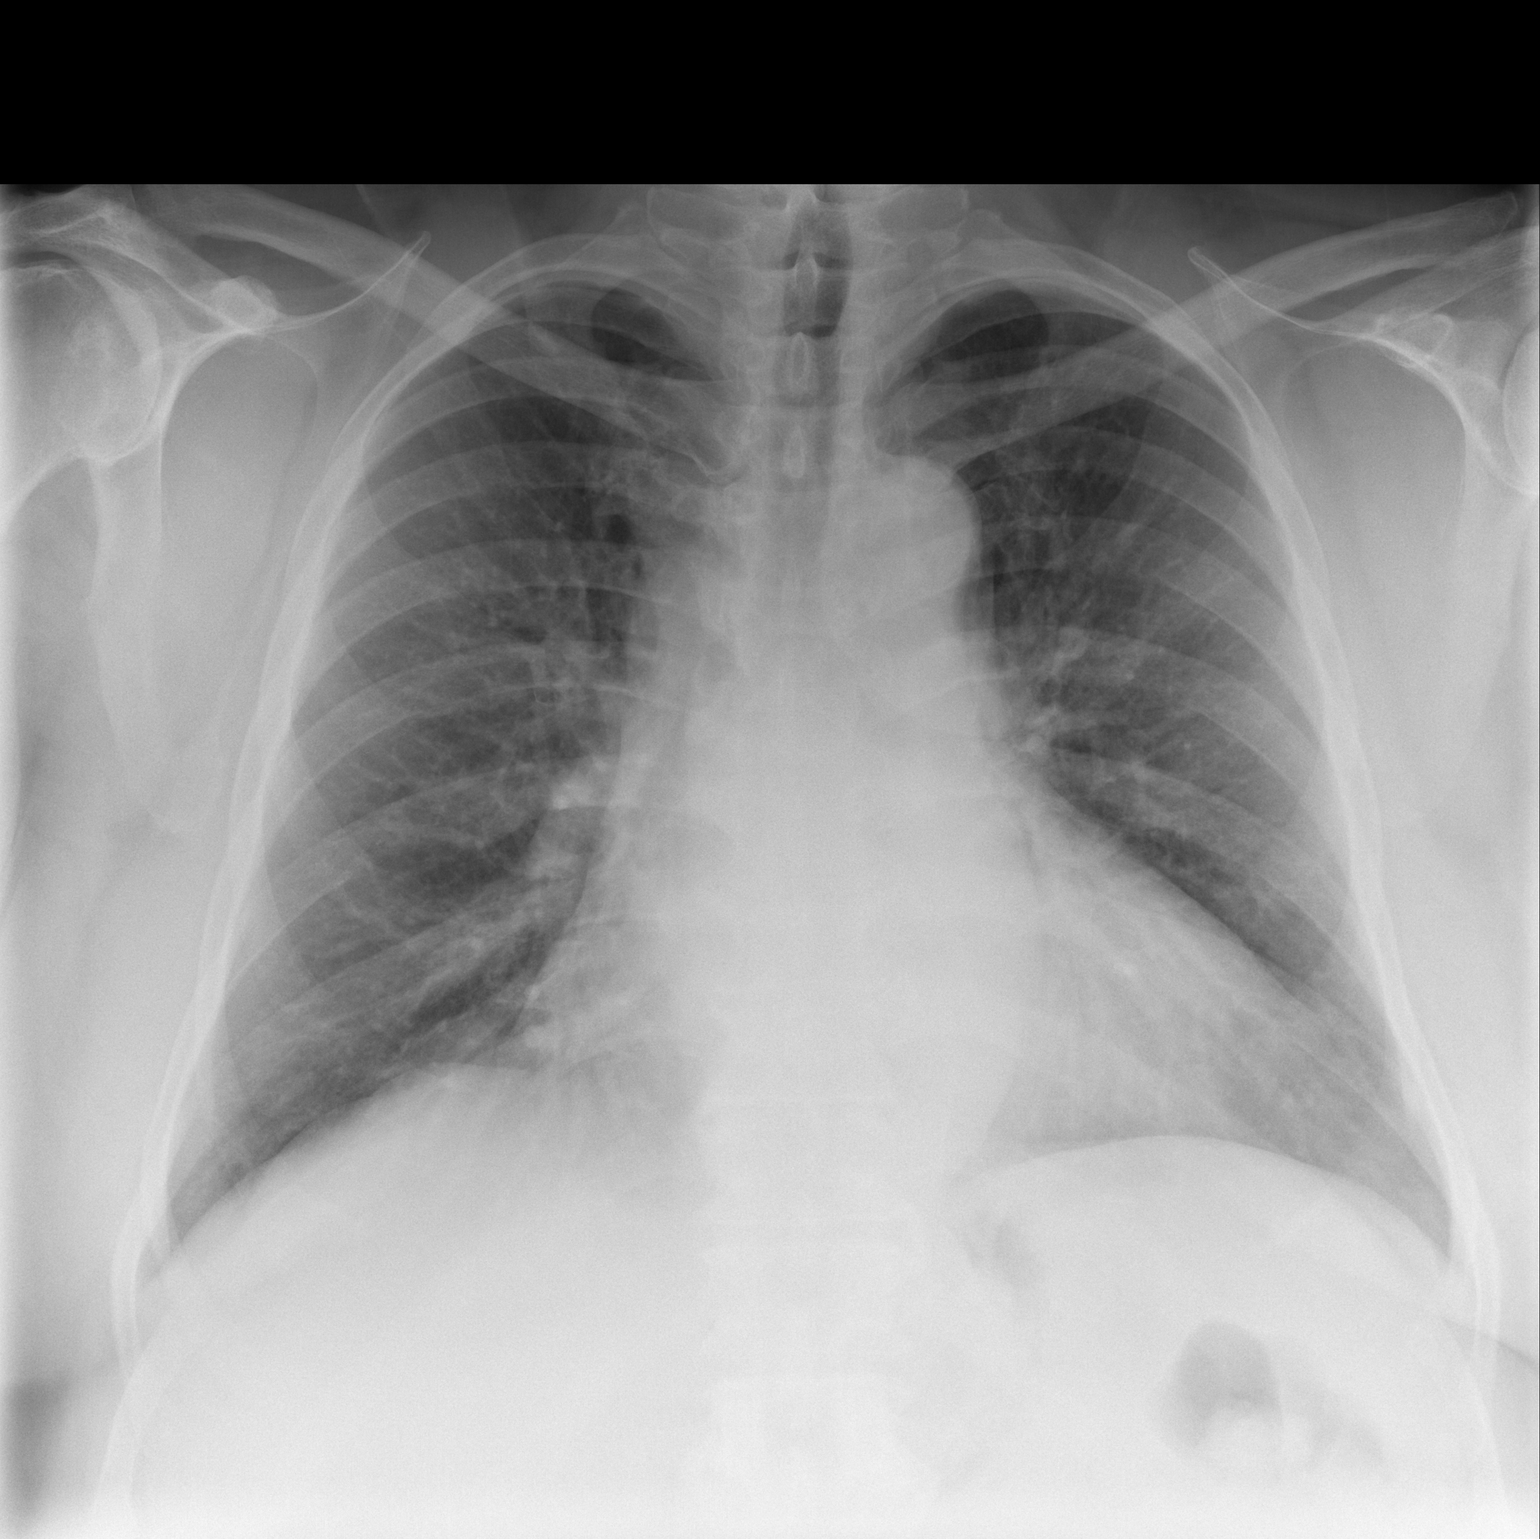

[w chest lat *]
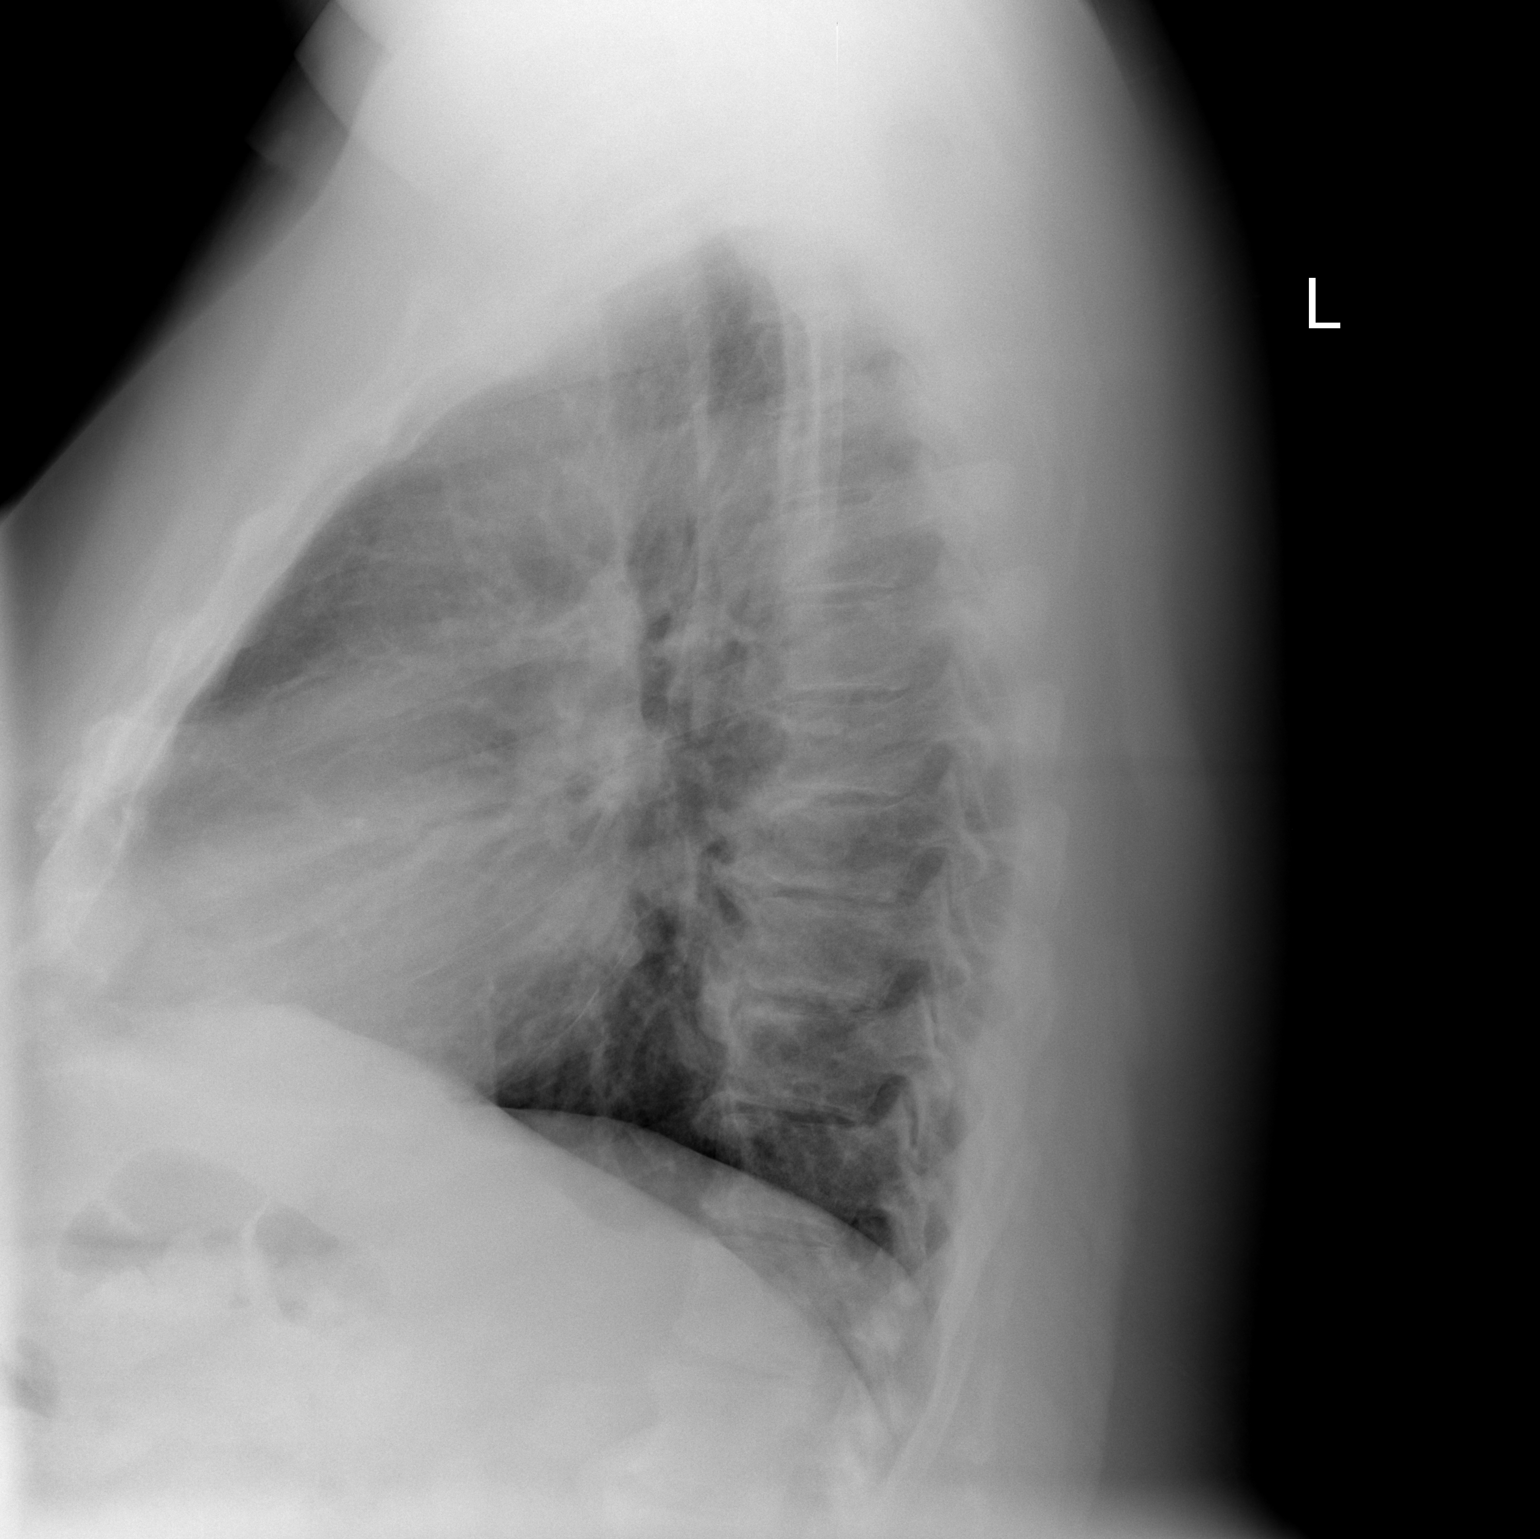

[2 of 2 positions shown; findings below may reference images not displayed]

FINDINGS: Mild cardiomegaly.  No confluent opacities, effusions or
overt edema.  Degenerative changes in the thoracic spine.
IMPRESSION: Cardiomegaly.  No active disease.

## 2013-08-25 ENCOUNTER — Other Ambulatory Visit: Payer: Self-pay | Admitting: Family

## 2013-09-06 ENCOUNTER — Encounter: Payer: Self-pay | Admitting: *Deleted

## 2013-09-10 ENCOUNTER — Other Ambulatory Visit: Payer: Self-pay

## 2013-09-10 MED ORDER — CELECOXIB 200 MG PO CAPS: 200.0000 mg | ORAL_CAPSULE | Freq: Every day | ORAL | Status: DC | PRN

## 2013-09-11 ENCOUNTER — Ambulatory Visit (INDEPENDENT_AMBULATORY_CARE_PROVIDER_SITE_OTHER): Payer: BC Managed Care – PPO | Admitting: Cardiovascular Disease

## 2013-09-11 ENCOUNTER — Encounter: Payer: Self-pay | Admitting: Cardiovascular Disease

## 2013-09-11 VITALS — BP 129/72 | HR 80 | Ht 70.5 in | Wt 362.0 lb

## 2013-09-11 DIAGNOSIS — I2782 Chronic pulmonary embolism: Secondary | ICD-10-CM

## 2013-09-11 DIAGNOSIS — R079 Chest pain, unspecified: Secondary | ICD-10-CM

## 2013-09-11 NOTE — Patient Instructions (Signed)
Your physician wants you to follow-up in:  12 months.  You will receive a reminder letter in the mail two months in advance. If you don't receive a letter, please call our office to schedule the follow-up appointment.  Your physician has requested that you have an exercise stress myoview. For further information please visit [email or web address]. Please follow instruction sheet, as given.  

## 2013-09-11 NOTE — Progress Notes (Signed)
History of Present Illness: 57 yo WM with history of GERD, OSA, PE here today for cardiac follow up. I saw him as a new patient September 2011. He was previously followed by Dr. Armanda Magic with [facility]. He has had chest pain in the past and had several stress tests with most recent being 2010. He was told it was normal. In 2011 had a left rotator cuff surgery and developed hiccups. He underwent a CT angiogram and was found to have bilateral PE. He was treated with coumadin but this was stopped in March 2012 and he had recurrence of PE on CTA May 2012. He has been on Xarelto per primary care. Negative hypercoagulable workup last year. He wears CPAP for sleep apnea.   He is here today for cardiac follow up. He describes chest pressure and pain over last few weeks. Associated with SOB. He has gained weight recently. The chest pressure lasts for up to 15 minutes. No palpitations.   Primary Care Physician: Clent Ridges  Past Medical History  Diagnosis Date  . OSA (obstructive sleep apnea)     dr Shelle Iron  . Testosterone deficiency     dr Patsi Sears, shots every 2 weeks  . ED (erectile dysfunction)   . GERD (gastroesophageal reflux disease)     [facility]  . Low back pain     dr Channing Mutters, dr Farris Has, dr Ethelene Hal, herniated disc L4-5  . PE (pulmonary embolism)     bilateral sep 2011 and again bilateral May 2012  . 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 Primary hypercoagulable state     no etiology found per Dr. Shirline Frees   . Hx of colonoscopy   . Pulmonary embolism 02/22/2010    CT angio Dx    Past Surgical History  Procedure Laterality Date  . Knee surgery      both  . Shoulder surgery      right and left  . Esi      L4-5 dr Channing Mutters 03/2010  . Rotator cuff repair  08-20-10    left dr Wyline Mood complicated by PE  . Esophagogastroduodenoscopy      x2 - normal except reflux  . Laparoscopic appendectomy  09/13/2012    Procedure: APPENDECTOMY LAPAROSCOPIC;  Surgeon: Almond Lint, MD;  Location: [facility];   Service: General;  Laterality: N/A;    Current Outpatient Prescriptions  Medication Sig Dispense Refill  . ALPRAZolam (XANAX) 1 MG tablet Take 1 mg by mouth 3 (three) times daily as needed. Anxiety.      . celecoxib (CELEBREX) 200 MG capsule Take 1 capsule (200 mg total) by mouth daily as needed. Pain.  30 capsule  1  . CIALIS 20 MG tablet TAKE 1 TABLET BY MOUTH AS NEEDED  10 tablet  1  . diphenhydramine-acetaminophen (TYLENOL PM) 25-500 MG TABS Take 1 tablet by mouth at bedtime as needed.      . folic acid (FOLVITE) 1 MG tablet Take 1 mg by mouth daily.      . furosemide (LASIX) 40 MG tablet Take 40 mg by mouth as needed.       . Multiple Vitamin (MULTI-VITAMIN DAILY PO) Take by mouth every morning.      [name] Kitchen omeprazole (PRILOSEC) 40 MG capsule Take 1 capsule (40 mg total) by mouth daily.  30 capsule  6  . oxyCODONE-acetaminophen (PERCOCET/ROXICET) 5-325 MG per tablet Take 1-2 tablets by mouth every 4 (four) hours as needed.  40 tablet  0  .  polyethylene glycol (MIRALAX / GLYCOLAX) packet Take 17 g by mouth daily as needed.  1 each    . XARELTO 20 MG TABS tablet TAKE 1 TABLET (20 MG TOTAL) BY MOUTH DAILY.  30 tablet  3   No current facility-administered medications for this visit.    No Known Allergies  History   Social History  . Marital Status: Married    Spouse Name: N/A    Number of Children: 3  . Years of Education: N/A   Occupational History  . Desk job-petroleum dispatcher    Social History Main Topics  . Smoking status: Never Smoker   . Smokeless tobacco: Never Used  . Alcohol Use: No  . Drug Use: No  . Sexual Activity: Not on file   Other Topics Concern  . Not on file   Social History Narrative  . No narrative on file    Family History  Problem Relation Age of Onset  . Hypertension Other   . Cancer Other     lung  . Coronary artery disease Neg Hx     Review of Systems:  As stated in the HPI and otherwise negative.   BP 129/72  Pulse 80  Ht 5' 10.5"  (1.791 m)  Wt 362 lb (164.202 kg)  BMI 51.19 kg/m2  Physical Examination: General: Well developed, well nourished, NAD HEENT: OP clear, mucus membranes moist SKIN: warm, dry. No rashes. Neuro: No focal deficits Musculoskeletal: Muscle strength 5/5 all ext Psychiatric: Mood and affect normal Neck: No JVD, no carotid bruits, no thyromegaly, no lymphadenopathy. Lungs:Clear bilaterally, no wheezes, rhonci, crackles Cardiovascular: Regular rate and rhythm. No murmurs, gallops or rubs. Abdomen:Soft. Bowel sounds present. Non-tender.  Extremities: No lower extremity edema. Pulses are 2 + in the bilateral DP/PT.  EKG: NSR, rate 78 bpm. LAFB.   Assessment and Plan:   1. Pulmonary embolism: Continue anticoagulation with Xarelto.   2. Chest pain: No known CAD. He is morbidly obese. Will arrange exercise stress myoview to exclude ischemia.

## 2013-09-27 ENCOUNTER — Other Ambulatory Visit: Payer: Self-pay | Admitting: Hematology & Oncology

## 2013-09-30 ENCOUNTER — Ambulatory Visit (HOSPITAL_COMMUNITY): Payer: BC Managed Care – PPO | Attending: Cardiology | Admitting: Radiology

## 2013-09-30 VITALS — BP 122/71 | Ht 70.5 in | Wt 364.0 lb

## 2013-09-30 DIAGNOSIS — R079 Chest pain, unspecified: Secondary | ICD-10-CM

## 2013-09-30 DIAGNOSIS — R0609 Other forms of dyspnea: Secondary | ICD-10-CM | POA: Insufficient documentation

## 2013-09-30 DIAGNOSIS — R0602 Shortness of breath: Secondary | ICD-10-CM | POA: Insufficient documentation

## 2013-09-30 DIAGNOSIS — R0989 Other specified symptoms and signs involving the circulatory and respiratory systems: Secondary | ICD-10-CM | POA: Insufficient documentation

## 2013-09-30 DIAGNOSIS — R0789 Other chest pain: Secondary | ICD-10-CM | POA: Insufficient documentation

## 2013-09-30 DIAGNOSIS — E669 Obesity, unspecified: Secondary | ICD-10-CM | POA: Insufficient documentation

## 2013-09-30 DIAGNOSIS — R002 Palpitations: Secondary | ICD-10-CM | POA: Insufficient documentation

## 2013-09-30 MED ORDER — TECHNETIUM TC 99M SESTAMIBI GENERIC - CARDIOLITE
33.0000 | Freq: Once | INTRAVENOUS | Status: AC | PRN
  Administered 2013-09-30: 33 via INTRAVENOUS

## 2013-09-30 NOTE — Progress Notes (Signed)
[facility] SITE 3 NUCLEAR MED [address] [place], [place] [address] [phone]    Cardiology Nuclear Med Study  Gregory Cortez is a 57 y.o. male     MRN : [id number]     DOB: 1956/05/14  Procedure Date: 09/30/2013  Nuclear Med Background Indication for Stress Test:  Evaluation for Ischemia History: No prior known history of CAD; 2010 Myocardial Perfusion Study: Normal Deboraha Sprang) Cardiac Risk Factors: Obesity  Symptoms:  Chest Pain, Palpitations and SOB   Nuclear Pre-Procedure Caffeine/Decaff Intake:  None > 12 hrs NPO After: 9:00pm   Lungs:  clear O2 Sat: 96% on room air. IV 0.9% NS with Angio Cath:  22g  IV Site: R Antecubital x 1, tolerated well IV Started by:  Dario Guardian, CNMT  Chest Size (in):  56-58 Cup Size: n/a  Height: 5' 10.5" (1.791 m)  Weight:  364 lb (165.109 kg)  BMI:  Body mass index is 51.47 kg/(m^2). Tech Comments:  n/a    Nuclear Med Study 1 or 2 day study: 2 day  Stress Test Type:  Stress  Reading MD: Willa Rough, MD  Order Authorizing Provider:  Verne Carrow, MD  Resting Radionuclide: Technetium 27m Sestamibi  Resting Radionuclide Dose: 33.0 mCi  10/07/13  Stress Radionuclide:  Technetium 18m Sestamibi  Stress Radionuclide Dose: 33.0 mCi  09/30/13          Stress Protocol Rest HR: 72 Stress HR: 148  Rest BP: 122/71 Stress BP: 208/108  Exercise Time (min): 5:00 METS: 6.40   Predicted Max HR: 166 bpm % Max HR: 89.16 bpm Rate Pressure Product: 46962   Dose of Adenosine (mg):  n/a Dose of Lexiscan: n/a mg  Dose of Atropine (mg): n/a Dose of Dobutamine: n/a mcg/kg/min (at max HR)  Stress Test Technologist: Milana Na, EMT-P  Nuclear Technologist:  Harlow Asa, CNMT     Rest Procedure:  Myocardial perfusion imaging was performed at rest 45 minutes following the intravenous administration of Technetium 22m Sestamibi. Rest ECG: NSR - Normal EKG  Stress Procedure:  The patient exercised on the treadmill utilizing the  Bruce Protocol for 5:00 minutes. The patient stopped due to sob, fatigue, and denied any chest pain.  Technetium 46m Sestamibi was injected at peak exercise and myocardial perfusion imaging was performed after a brief delay. Stress ECG: No significant change from baseline ECG  QPS Raw Data Images:  Normal; no motion artifact; normal heart/lung ratio. Stress Images:  Normal homogeneous uptake in all areas of the myocardium. Rest Images:  Normal homogeneous uptake in all areas of the myocardium. Subtraction (SDS):  No evidence of ischemia. Transient Ischemic Dilatation (Normal <1.22):  0.80 Lung/Heart Ratio (Normal <0.45):  0.44  Quantitative Gated Spect Images QGS EDV:  133 ml QGS ESV:  45 ml  Impression Exercise Capacity:  Excellent exercise capacity. BP Response:  Normal blood pressure response. Clinical Symptoms:  Mild chest pain/dyspnea. ECG Impression:  No significant ST segment change suggestive of ischemia. Comparison with Prior Nuclear Study: No significant change from previous study  Overall Impression:  Normal stress nuclear study.  LV Ejection Fraction: 66%.  LV Wall Motion:  NL LV Function; NL Wall Motion   Tobias Alexander, Rexene Edison 10/07/2013

## 2013-10-07 ENCOUNTER — Ambulatory Visit (HOSPITAL_COMMUNITY): Payer: BC Managed Care – PPO | Attending: Cardiology

## 2013-10-07 DIAGNOSIS — R079 Chest pain, unspecified: Secondary | ICD-10-CM

## 2013-10-07 MED ORDER — TECHNETIUM TC 99M SESTAMIBI GENERIC - CARDIOLITE
33.0000 | Freq: Once | INTRAVENOUS | Status: AC | PRN
  Administered 2013-10-07: 33 via INTRAVENOUS

## 2013-12-15 ENCOUNTER — Other Ambulatory Visit: Payer: Self-pay | Admitting: Family

## 2013-12-24 ENCOUNTER — Encounter: Payer: Self-pay | Admitting: Pulmonary Disease

## 2013-12-24 ENCOUNTER — Telehealth: Payer: Self-pay | Admitting: Pulmonary Disease

## 2013-12-24 DIAGNOSIS — G4733 Obstructive sleep apnea (adult) (pediatric): Secondary | ICD-10-CM

## 2013-12-24 NOTE — Telephone Encounter (Signed)
I spoke with pt. He reports he was placed on auto mode back in July. He reports [facility] got download off his machine but never heard from us. Looking in pt chart I do not see anything scanned in.  i called Melissa and she is checking on this and will get a download sent over to us. Will await fax

## 2013-12-24 NOTE — Telephone Encounter (Signed)
Let pt know that his optimal pressure is 13cm.  Please send an order to his dme.  Thanks.

## 2013-12-24 NOTE — Telephone Encounter (Signed)
[facility] and [facility]. Please advise thanks

## 2013-12-25 NOTE — Telephone Encounter (Signed)
Spoke with pt and aware of recs. Order placed. Nothing further needed

## 2014-01-27 ENCOUNTER — Telehealth: Payer: Self-pay | Admitting: Hematology & Oncology

## 2014-01-27 ENCOUNTER — Ambulatory Visit (INDEPENDENT_AMBULATORY_CARE_PROVIDER_SITE_OTHER): Payer: 59 | Admitting: Family Medicine

## 2014-01-27 ENCOUNTER — Encounter: Payer: Self-pay | Admitting: Family Medicine

## 2014-01-27 VITALS — BP 128/70 | HR 100 | Temp 99.5°F | Ht 70.5 in | Wt 352.0 lb

## 2014-01-27 DIAGNOSIS — M546 Pain in thoracic spine: Secondary | ICD-10-CM

## 2014-01-27 MED ORDER — CYCLOBENZAPRINE HCL 10 MG PO TABS: 10.0000 mg | ORAL_TABLET | Freq: Three times a day (TID) | ORAL | Status: DC | PRN

## 2014-01-27 MED ORDER — METHYLPREDNISOLONE ACETATE 80 MG/ML IJ SUSP: 120.0000 mg | Freq: Once | INTRAMUSCULAR | Status: DC

## 2014-01-27 MED ORDER — CELECOXIB 200 MG PO CAPS: 200.0000 mg | ORAL_CAPSULE | Freq: Two times a day (BID) | ORAL | Status: DC

## 2014-01-27 MED ORDER — METHYLPREDNISOLONE ACETATE 80 MG/ML IJ SUSP
160.0000 mg | Freq: Once | INTRAMUSCULAR | Status: AC
  Administered 2014-01-27: 160 mg via INTRAMUSCULAR

## 2014-01-27 NOTE — Telephone Encounter (Signed)
Pt called made 2-11 appointment, said has lump in arm pit, no pain of discharge. RN aware

## 2014-01-27 NOTE — Progress Notes (Signed)
Pre visit review using our clinic review tool, if applicable. No additional management support is needed unless otherwise documented below in the visit note. 

## 2014-01-27 NOTE — Addendum Note (Signed)
Addended by: Aniceto BossNIMMONS, Maxximus Gotay A on: 01/27/2014 12:15 PM   Modules accepted: Orders

## 2014-01-27 NOTE — Progress Notes (Signed)
   Subjective:    Patient ID: Gregory Cortez, male    DOB: 1956/03/19, 58 y.o.   MRN: [id number]  HPI Here for 2 days of pain and spasms in the center of the back. He has been lifting heavy weights at the gym recently. He has tried heat and Robaxin with no relief.    Review of Systems  Constitutional: Negative.   Respiratory: Negative.   Cardiovascular: Negative.   Musculoskeletal: Positive for back pain.       Objective:   Physical Exam  Constitutional: He appears well-developed and well-nourished.  Cardiovascular: Normal rate, regular rhythm, normal heart sounds and intact distal pulses.   Pulmonary/Chest: Effort normal and breath sounds normal.  Musculoskeletal:  Tender around the thoracic spine with spasms of the middle back muscles. ROM is limited           Assessment & Plan:  Given a steroid shot. Try Flexeril and Celebrex.

## 2014-01-29 ENCOUNTER — Other Ambulatory Visit: Payer: Self-pay | Admitting: *Deleted

## 2014-01-29 ENCOUNTER — Encounter: Payer: Self-pay | Admitting: Hematology & Oncology

## 2014-01-29 ENCOUNTER — Other Ambulatory Visit: Payer: BC Managed Care – PPO | Admitting: Lab

## 2014-01-29 ENCOUNTER — Ambulatory Visit (HOSPITAL_BASED_OUTPATIENT_CLINIC_OR_DEPARTMENT_OTHER): Payer: BC Managed Care – PPO | Admitting: Hematology & Oncology

## 2014-01-29 VITALS — BP 123/55 | HR 63 | Temp 97.8°F | Resp 20 | Ht 70.0 in | Wt 351.0 lb

## 2014-01-29 DIAGNOSIS — Z86711 Personal history of pulmonary embolism: Secondary | ICD-10-CM

## 2014-01-29 DIAGNOSIS — E291 Testicular hypofunction: Secondary | ICD-10-CM

## 2014-01-29 DIAGNOSIS — I2699 Other pulmonary embolism without acute cor pulmonale: Secondary | ICD-10-CM

## 2014-01-29 DIAGNOSIS — Z7901 Long term (current) use of anticoagulants: Secondary | ICD-10-CM

## 2014-01-29 NOTE — Progress Notes (Signed)
Opened encounter to put orders in on patient but patient changed mind on doing procedure.

## 2014-01-30 ENCOUNTER — Telehealth: Payer: Self-pay | Admitting: Hematology & Oncology

## 2014-01-30 NOTE — Telephone Encounter (Signed)
Pt aware would have to have mammogram with US. Pt refused. Dr. Myna HidalgoEnnever aware

## 2014-01-31 ENCOUNTER — Ambulatory Visit (HOSPITAL_BASED_OUTPATIENT_CLINIC_OR_DEPARTMENT_OTHER): Payer: BC Managed Care – PPO

## 2014-01-31 NOTE — Progress Notes (Signed)
[facility] [address] [place], [place]  [phone])   Your Providers PCP: Nelwyn SalisburyStephen A Fry, MD,  [phone]) Referring Provider: Nelwyn SalisburyStephen A Fry, MD,  [phone]) Care Team Provider: Kathleene Hazelhristopher D McAlhany, MD,  [phone]) Care Team Provider: Barbaraann ShareKeith M Clance, MD,  [phone]) Care Team Provider: Josph MachoPeter R Taja Pentland, MD,  [phone])    DIAGNOSES:  Problem List Items Addressed This Visit   None    Visit Diagnoses   Pulmonary embolism    -  Primary       CURRENT THERAPY:  Xarelto 20 mg by mouth daily  INTERIM HISTORY: Mr. Gregory Cortez comes in for followup. I last him for urine have. He is a lifelong anticoagulation because of a pulmonary embolism.  He has back problems. He does get epidural injections. At that he wants to have another one. In the past, he was on Coumadin we had to bridge him with Lovenox. I told him that would Xarelto, we just stop 2 days before the procedure and restart the day afterwards.  He works out. I see in the gym every now and then. He had lost quite awake was put him back on.  He's had no bleeding. He's had no problems with nausea vomiting. He has had no leg swelling.  He is on some forecasted. Last time that he had a CT angiogram was back in November of 2012. This was negative for any obvious pulmonary embolism.  PHYSICAL EXAMINATION: [name] in no obvious distress. Lungs are clear. Cardiac exam regular in rhythm. Abdomen obese. No palpable of her or spleen. Extremities shows some chronic stasis dermatitis. Neurological exam no focal neurological deficits. Skin exam no ecchymoses or petechia.   Vital signs:   Filed Vitals:   01/29/14 1155  BP: 123/55  Pulse: 63  Temp: 97.8 F (36.6 C)  TempSrc: Oral  Resp: 20  Height: 5\' 10"  (1.778 m)  Weight: 351 lb (159.213 kg)      LABORATORY STUDIES:  CBC    Component Value Date/Time   WBC 5.9  06/06/2013 1349   WBC 5.2 10/05/2012 1137   WBC 6.9 09/16/2010 1011   RBC 4.41 06/06/2013 1349   RBC 5.24 09/16/2010 1011   HGB 14.0 06/06/2013 1349   HGB 12.4* 10/05/2012 1137   HGB 16.9 09/16/2010 1011   HCT 41.6 06/06/2013 1349   HCT 38.1* 10/05/2012 1137   HCT 49.0 09/16/2010 1011   PLT 238.0 06/06/2013 1349   PLT 459* 10/05/2012 1137   PLT 294 09/16/2010 1011   MCV 94.2 06/06/2013 1349   MCV 93 10/05/2012 1137   MCV 93.6 09/16/2010 1011   MCH 30.2 10/05/2012 1137   MCH 29.9 09/29/2012 0851   MCH 32.3 09/16/2010 1011   MCHC 33.6 06/06/2013 1349   MCHC 32.5 10/05/2012 1137   MCHC 34.5 09/16/2010 1011   RDW 13.7 06/06/2013 1349   RDW 14.1 10/05/2012 1137   RDW 12.7 09/16/2010 1011   LYMPHSABS 1.1 06/06/2013 1349   LYMPHSABS 1.2 10/05/2012 1137   LYMPHSABS 1.2 09/16/2010 1011   MONOABS 0.5 06/06/2013 1349   MONOABS 0.7 09/16/2010 1011   EOSABS 0.4 06/06/2013 1349   EOSABS 0.2 10/05/2012 1137   EOSABS 0.2 09/16/2010 1011   BASOSABS 0.0 06/06/2013 1349   BASOSABS 0.1 10/05/2012 1137   BASOSABS 0.0 09/16/2010 1011     IMPRESSION: Gregory Cortez is a 58 y.o. male with a history of recurrent  pulmonary embolism. He is a lifelong anticoagulation. He now is on Xarelto and doing well.[name] Kitchen  PLAN: We will plan to get him back to see [place]  as needed. We really are not make a difference with his management right now. We can certainly see him back at any time.. If there is any problem in between visits, he can certainly come back and see [place].   ______________________________  Gregory Cortez, M.D.  01/31/2014 3:13 PM

## 2014-02-12 ENCOUNTER — Other Ambulatory Visit: Payer: Self-pay | Admitting: Family Medicine

## 2014-02-24 ENCOUNTER — Telehealth: Payer: Self-pay | Admitting: Family Medicine

## 2014-02-24 NOTE — Telephone Encounter (Signed)
Patient coming to see SF on 02/25/14

## 2014-02-24 NOTE — Telephone Encounter (Signed)
Patient Information:  Caller Name: Viviann SpareSteven  Phone: [phone]  Patient: Maudry MayhewMaschi, Schawn  Gender: Male  DOB: 08/18/1956  Age: 58 Years  PCP: Gershon CraneFry, Stephen [facility](Family Practice)  Office Follow Up:  Does the office need to follow up with this patient?: No  Instructions For The Office: N/A   Symptoms  Reason For Call & Symptoms: Has noticed blood in stool occasionally for "past 2 weeks weeks". Has happened 2 times 3-9.  Reviewed Health History In EMR: Yes  Reviewed Medications In EMR: Yes  Reviewed Allergies In EMR: Yes  Reviewed Surgeries / Procedures: N/A  Date of Onset of Symptoms: 02/24/2014  Guideline(s) Used:  No Protocol Available - Sick Adult  Disposition Per Guideline:   Go to ED Now (or to Office with PCP Approval)  Reason For Disposition Reached:   Nursing judgment  Advice Given:  N/A  Patient Refused Recommendation:  Patient Will Follow Up With Office Later  Wants appointment in office 02-25-14. Scheduled for 0930.

## 2014-02-25 ENCOUNTER — Ambulatory Visit (INDEPENDENT_AMBULATORY_CARE_PROVIDER_SITE_OTHER): Payer: BC Managed Care – PPO | Admitting: Family Medicine

## 2014-02-25 ENCOUNTER — Encounter: Payer: Self-pay | Admitting: Family Medicine

## 2014-02-25 VITALS — BP 124/66 | HR 82 | Temp 98.7°F | Ht 70.5 in | Wt 346.0 lb

## 2014-02-25 DIAGNOSIS — K649 Unspecified hemorrhoids: Secondary | ICD-10-CM

## 2014-02-25 MED ORDER — DOCUSATE SODIUM 100 MG PO CAPS: 100.0000 mg | ORAL_CAPSULE | Freq: Two times a day (BID) | ORAL | Status: DC

## 2014-02-25 NOTE — Telephone Encounter (Signed)
Seen today. 

## 2014-02-25 NOTE — Progress Notes (Signed)
   Subjective:    Patient ID: Gregory Cortez, male    DOB: [birth year], 58 y.o.   MRN: [id number]  HPI Here for several episodes of bright red blood in the stools for 2 weeks. His stools are often hard and he has to strain to move them. There is no pain with BMs. He is using a packet of metamucil in his protein shake every morning. He drinks plenty of water. He had a normal colonoscopy in 2010.    Review of Systems  Constitutional: Negative.   Gastrointestinal: Positive for constipation, blood in stool and anal bleeding. Negative for nausea, vomiting, abdominal pain, diarrhea, abdominal distention and rectal pain.       Objective:   Physical Exam  Constitutional: He appears well-developed and well-nourished.  Abdominal: Soft. Bowel sounds are normal. He exhibits no distension and no mass. There is no tenderness. There is no rebound and no guarding.  Genitourinary:  Several small but inflamed external hemorrhoids           Assessment & Plan:  He will add Colace 100 mg bid to his regimen. Use Preparation H prn.

## 2014-02-25 NOTE — Progress Notes (Signed)
Pre visit review using our clinic review tool, if applicable. No additional management support is needed unless otherwise documented below in the visit note. 

## 2014-04-02 ENCOUNTER — Telehealth: Payer: Self-pay

## 2014-04-02 NOTE — Telephone Encounter (Signed)
Received call from pt reporting he will be receiving an epidural injection in his back on 4/28 and needs instructions for taking Xarelto.   Per Dr Myna HidalgoEnnever, d/c Xarelto 2 days prior to procedure and restart the day after.  Pt verbalizes understanding and repeats instructions - For Tuesday procedure he will not take Sunday, Monday or Tuesday doses and restart on Wednesday.  dph

## 2014-04-03 ENCOUNTER — Other Ambulatory Visit: Payer: Self-pay | Admitting: Family Medicine

## 2014-04-14 ENCOUNTER — Other Ambulatory Visit: Payer: Self-pay | Admitting: Family

## 2014-05-16 ENCOUNTER — Other Ambulatory Visit: Payer: Self-pay | Admitting: Family Medicine

## 2014-05-20 ENCOUNTER — Telehealth: Payer: Self-pay | Admitting: Family Medicine

## 2014-05-20 NOTE — Telephone Encounter (Signed)
Call in Viagra 100 mg to use prn, #10 with 11 rf  

## 2014-05-20 NOTE — Telephone Encounter (Signed)
[employer] [place] denied [place] for Cialis, pt must try and fail Viagra first.

## 2014-05-21 MED ORDER — SILDENAFIL CITRATE 100 MG PO TABS: 100.0000 mg | ORAL_TABLET | Freq: Every day | ORAL | Status: DC | PRN

## 2014-05-21 NOTE — Telephone Encounter (Signed)
I spoke with pt and he would like to try the change and I sent script e-scribe.

## 2014-07-02 ENCOUNTER — Other Ambulatory Visit: Payer: Self-pay | Admitting: Family Medicine

## 2014-07-04 NOTE — Telephone Encounter (Signed)
Call in #90 with 5 rf 

## 2014-08-08 ENCOUNTER — Other Ambulatory Visit: Payer: Self-pay | Admitting: Family

## 2014-08-09 ENCOUNTER — Other Ambulatory Visit: Payer: Self-pay | Admitting: Family Medicine

## 2014-10-01 ENCOUNTER — Other Ambulatory Visit: Payer: Self-pay | Admitting: Nurse Practitioner

## 2014-10-01 MED ORDER — FOLIC ACID 1 MG PO TABS: 1.0000 mg | ORAL_TABLET | Freq: Every day | ORAL | Status: DC

## 2014-10-13 ENCOUNTER — Other Ambulatory Visit: Payer: Self-pay | Admitting: Family Medicine

## 2014-10-29 ENCOUNTER — Other Ambulatory Visit: Payer: Self-pay | Admitting: Family Medicine

## 2014-11-21 ENCOUNTER — Encounter: Payer: Self-pay | Admitting: *Deleted

## 2014-11-21 NOTE — Progress Notes (Signed)
Spoke with Deanna at regarding an injection scheduled for patient. Medical clearance was needed from Dr Myna HidalgoEnnever. Dr Myna HidalgoEnnever gave medical clearance with the following conditions: Patient is to stop Xarelto 2 days prior to surgery and resume the day after surgery. This was [facility] to [facility] verb[facility] and via fax.

## 2014-11-24 ENCOUNTER — Telehealth: Payer: Self-pay

## 2014-11-24 NOTE — Telephone Encounter (Signed)
Letter of medical clearance & instructions to hold Xarelto for 2 days prior & 1 day post procedure [facility] dph

## 2014-12-18 ENCOUNTER — Other Ambulatory Visit: Payer: Self-pay | Admitting: Family Medicine

## 2014-12-18 NOTE — Telephone Encounter (Signed)
Can we refill this? 

## 2015-01-06 ENCOUNTER — Telehealth: Payer: Self-pay | Admitting: Family Medicine

## 2015-01-06 MED ORDER — OMEPRAZOLE 40 MG PO CPDR: 40.0000 mg | DELAYED_RELEASE_CAPSULE | Freq: Every day | ORAL | Status: DC

## 2015-01-06 NOTE — Telephone Encounter (Signed)
Refill request for Omeprazole 40 mg take 1 po qd and send to [facility]. I did send script e-scribe.

## 2015-01-14 ENCOUNTER — Telehealth: Payer: Self-pay | Admitting: Family Medicine

## 2015-01-14 NOTE — Telephone Encounter (Signed)
Dr. Fry is aware.  

## 2015-01-14 NOTE — Telephone Encounter (Signed)
[facility] - Client TELEPHONE ADVICE RECORD   [facility]     Patient Name: Gregory Cortez Initial Comment Caller states cut index finger of knuckle rt hand. he is on blood thinners  DOB: [birth year]      Nurse Assessment  Nurse: Roderic OvensNorth, RN, Amy Date/Time [name](Eastern Time): 01/14/2015 4:31:33 PM  Confirm and document reason for call. If symptomatic, describe symptoms. ---CALLER STATES THAT HE HAS A CUT ON HIS INDEX FINGER. HE STATES THAT HE IS ON BLOOD THINNER, XARELTO. ABOUT 2 INCHES IN SIZE. PRESSURE ON THE FINGER NOW, NO BLEEDING AS LONG AS HE DOES NOT REMOVE THE BANDAGE.  Has the patient traveled out of the country within the last 30 days? ---Not Applicable  Does the patient require triage? ---Yes  Related visit to physician within the last 2 weeks? ---No  Does the PT have any chronic conditions? (i.e. diabetes, asthma, etc.) ---Yes  List chronic conditions. ---PE'S, XARELTO    Guidelines     Guideline Title Affirmed Question Affirmed Notes   Finger Injury [1] Bleeding AND [2] won't stop after 10 minutes of direct pressure (using correct technique)    Final Disposition User   Go to ED Now [place], Charity fundraiserN, Amy

## 2015-02-06 ENCOUNTER — Encounter: Payer: Self-pay | Admitting: Nurse Practitioner

## 2015-02-06 NOTE — Progress Notes (Signed)
Spoke with Deanna at [facility] regarding an injection scheduled for patient. Medical clearance was needed from Dr Ennever. Dr Ennever gave medical clearance with the following conditions: Patient is to stop Xarelto 2 days prior to injection and resume the day after. This was communicated to Deanna verbally and via fax to [phone]. Confirmation received.  

## 2015-02-17 ENCOUNTER — Other Ambulatory Visit: Payer: Self-pay | Admitting: Family Medicine

## 2015-02-17 NOTE — Telephone Encounter (Signed)
Call in #90 only. He needs an OV soon

## 2015-02-20 ENCOUNTER — Emergency Department (HOSPITAL_BASED_OUTPATIENT_CLINIC_OR_DEPARTMENT_OTHER)
Admission: EM | Admit: 2015-02-20 | Discharge: 2015-02-21 | Disposition: A | Discharge status: Home / Self Care | Payer: BLUE CROSS/BLUE SHIELD | Attending: Emergency Medicine | Admitting: Emergency Medicine

## 2015-02-20 ENCOUNTER — Emergency Department (HOSPITAL_BASED_OUTPATIENT_CLINIC_OR_DEPARTMENT_OTHER): Payer: BLUE CROSS/BLUE SHIELD

## 2015-02-20 ENCOUNTER — Encounter (HOSPITAL_BASED_OUTPATIENT_CLINIC_OR_DEPARTMENT_OTHER): Payer: Self-pay | Admitting: *Deleted

## 2015-02-20 DIAGNOSIS — Z8669 Personal history of other diseases of the nervous system and sense organs: Secondary | ICD-10-CM | POA: Diagnosis not present

## 2015-02-20 DIAGNOSIS — K219 Gastro-esophageal reflux disease without esophagitis: Secondary | ICD-10-CM | POA: Diagnosis not present

## 2015-02-20 DIAGNOSIS — Z9889 Other specified postprocedural states: Secondary | ICD-10-CM | POA: Insufficient documentation

## 2015-02-20 DIAGNOSIS — Z79899 Other long term (current) drug therapy: Secondary | ICD-10-CM | POA: Diagnosis not present

## 2015-02-20 DIAGNOSIS — Z791 Long term (current) use of non-steroidal anti-inflammatories (NSAID): Secondary | ICD-10-CM | POA: Diagnosis not present

## 2015-02-20 DIAGNOSIS — D6852 Prothrombin gene mutation: Secondary | ICD-10-CM | POA: Insufficient documentation

## 2015-02-20 DIAGNOSIS — R0600 Dyspnea, unspecified: Secondary | ICD-10-CM | POA: Diagnosis not present

## 2015-02-20 DIAGNOSIS — R0689 Other abnormalities of breathing: Secondary | ICD-10-CM

## 2015-02-20 DIAGNOSIS — R0602 Shortness of breath: Secondary | ICD-10-CM | POA: Diagnosis present

## 2015-02-20 DIAGNOSIS — Z8639 Personal history of other endocrine, nutritional and metabolic disease: Secondary | ICD-10-CM | POA: Diagnosis not present

## 2015-02-20 DIAGNOSIS — Z87448 Personal history of other diseases of urinary system: Secondary | ICD-10-CM | POA: Insufficient documentation

## 2015-02-20 DIAGNOSIS — Z86711 Personal history of pulmonary embolism: Secondary | ICD-10-CM | POA: Diagnosis not present

## 2015-02-20 LAB — COMPREHENSIVE METABOLIC PANEL
ALT: 22 U/L (ref 0–53)
AST: 21 U/L (ref 0–37)
Albumin: 4.2 g/dL (ref 3.5–5.2)
Alkaline Phosphatase: 83 U/L (ref 39–117)
Anion gap: 0 — ABNORMAL LOW (ref 5–15)
BUN: 14 mg/dL (ref 6–23)
CO2: 25 mmol/L (ref 19–32)
Calcium: 8.2 mg/dL — ABNORMAL LOW (ref 8.4–10.5)
Chloride: 112 mmol/L (ref 96–112)
Creatinine, Ser: 0.76 mg/dL (ref 0.50–1.35)
GFR calc Af Amer: >90 mL/min (ref >90)
GFR calc non Af Amer: >90 mL/min (ref >90)
Glucose, Bld: 218 mg/dL — ABNORMAL HIGH (ref 70–99)
Potassium: 3.4 mmol/L — ABNORMAL LOW (ref 3.5–5.1)
Sodium: 137 mmol/L (ref 135–145)
Total Bilirubin: 0.4 mg/dL (ref 0.3–1.2)
Total Protein: 7.1 g/dL (ref 6.0–8.3)

## 2015-02-20 LAB — BRAIN NATRIURETIC PEPTIDE: B Natriuretic Peptide: 14.5 pg/mL (ref 0.0–100.0)

## 2015-02-20 LAB — PROTIME-INR
INR: 1.35 (ref 0.00–1.49)
Prothrombin Time: 16.7 seconds — ABNORMAL HIGH (ref 11.6–15.2)

## 2015-02-20 LAB — D-DIMER, QUANTITATIVE: D-Dimer, Quant: <0.27 ug/mL-FEU (ref 0.00–0.48)

## 2015-02-20 LAB — CBC
HCT: 43.9 % (ref 39.0–52.0)
Hemoglobin: 14.4 g/dL (ref 13.0–17.0)
MCH: 31.2 pg (ref 26.0–34.0)
MCHC: 32.8 g/dL (ref 30.0–36.0)
MCV: 95 fL (ref 78.0–100.0)
Platelets: 257 10*3/uL (ref 150–400)
RBC: 4.62 MIL/uL (ref 4.22–5.81)
RDW: 12.7 % (ref 11.5–15.5)
WBC: 7.3 10*3/uL (ref 4.0–10.5)

## 2015-02-20 LAB — TROPONIN I: Troponin I: <0.03 ng/mL (ref <0.031)

## 2015-02-20 MED ORDER — GI COCKTAIL ~~LOC~~
30.0000 mL | Freq: Once | ORAL | Status: AC
  Administered 2015-02-21: 30 mL via ORAL
  Filled 2015-02-20: qty 30

## 2015-02-20 NOTE — ED Notes (Signed)
Pt to xray via w/c, family with pt, no changes, no dyspnea, intermittent deep sighs. NAD, calm, interactive.

## 2015-02-20 NOTE — ED Notes (Signed)
Mid sternal and rt side chest tightness x 2 week,  Sob, tightness is lessen w movement

## 2015-02-20 NOTE — ED Notes (Signed)
Stopped xarelto on Monday, stopped for epidural Wednesday morning, restarted xarelto Wednesday night, here for c/o sob, sob ongoing for 2 weeks, but worse since stopping xarelto. Also reports "feel like there is some swelling in throat for a few days", alert, NAD, calm, interactive, no dyspnea noted, speech clear, resps e/u, intermittent deep sighs noted. Describes as "tightness in mid chest and R lung feels heavy, feel dehydrated".

## 2015-02-20 NOTE — ED Provider Notes (Signed)
CSN: [id number]     Arrival date & time 02/20/15  2128 History  This chart was scribe for Gregory Cortez Cords, MD by Angelene Giovanni, ED Scribe. The patient was seen in room MH04/MH04 and the patient's care was started at 11:42 PM.    Chief Complaint  Patient presents with  . Shortness of Breath   Patient is a 59 y.o. male presenting with shortness of breath. The history is provided by the patient. No language interpreter was used.  Shortness of Breath Severity:  Moderate Onset quality:  Gradual Duration:  2 weeks Timing:  Constant Progression:  Worsening Chronicity:  Recurrent Context: not fumes and not URI   Relieved by:  Nothing Exacerbated by: better with working out worse being still. Ineffective treatments:  None tried Associated symptoms: no abdominal pain, no chest pain, no cough, no diaphoresis, no fever, no hemoptysis, no neck pain, no sore throat, no syncope and no wheezing   Risk factors: obesity    HPI Comments: Gregory Cortez is a 60 y.o. male who presents to the Emergency Department complaining of moderate constant gradually worsening SOB onset 2 weeks ago.  He reports that he has been eating peanut butter every night. He reports that his SOB is worse lying flat or sitting still better at the gym. He states that when he lies flat, his symptoms are worse. He denies any change to his medication. Has not seen his PMD for his symptoms  PCP: Dr. Clent Ridges  Past Medical History  Diagnosis Date  . OSA (obstructive sleep apnea)     dr Shelle Iron  . Testosterone deficiency     dr Patsi Sears, shots every 2 weeks  . ED (erectile dysfunction)   . GERD (gastroesophageal reflux disease)     [facility]  . Low back pain     dr Channing Mutters, dr Farris Has, dr Ethelene Hal, herniated disc L4-5  . PE (pulmonary embolism)     bilateral sep 2011 and again bilateral May 2012  . 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 Primary hypercoagulable state     no etiology found per Dr. Shirline Frees   . Hx of colonoscopy    . Pulmonary embolism 02/22/2010    CT angio Dx   Past Surgical History  Procedure Laterality Date  . Knee surgery      both  . Shoulder surgery      right and left  . Esi      L4-5 dr Channing Mutters 03/2010  . Rotator cuff repair  08-20-10    left dr Wyline Mood complicated by PE  . Esophagogastroduodenoscopy      x2 - normal except reflux  . Laparoscopic appendectomy  09/13/2012    Procedure: APPENDECTOMY LAPAROSCOPIC;  Surgeon: Almond Lint, MD;  Location: [facility];  Service: General;  Laterality: N/A;   Family History  Problem Relation Age of Onset  . Hypertension Other   . Cancer Other     lung  . Coronary artery disease Neg Hx    History  Substance Use Topics  . Smoking status: Never Smoker   . Smokeless tobacco: Never Used     Comment: never used product  . Alcohol Use: No    Review of Systems  Constitutional: Negative for fever and diaphoresis.  HENT: Negative for sore throat.   Respiratory: Positive for shortness of breath. Negative for cough, hemoptysis, choking, chest tightness and wheezing.   Cardiovascular: Negative for chest pain, palpitations, leg swelling and syncope.  Gastrointestinal: Negative  for abdominal pain.  Musculoskeletal: Negative for neck pain.  All other systems reviewed and are negative.     Allergies  Review of patient's allergies indicates no known allergies.  Home Medications   Prior to Admission medications   Medication Sig Start Date End Date Taking? Authorizing Provider  ALPRAZolam Prudy Feeler) 1 MG tablet TAKE 1 TABLET 3 TIMES A DAY AS NEEDED 02/17/15   Nelwyn Salisbury, MD  celecoxib (CELEBREX) 200 MG capsule TAKE 1 CAPSULE TWICE DAILY FOR PAIN 10/14/14   Nelwyn Salisbury, MD  cyclobenzaprine (FLEXERIL) 10 MG tablet Take 1 tablet (10 mg total) by mouth 3 (three) times daily as needed for muscle spasms. 01/27/14   Nelwyn Salisbury, MD  diphenhydramine-acetaminophen (TYLENOL PM) 25-500 MG TABS Take 1 tablet by mouth at bedtime as needed.    Historical Provider, MD   docusate sodium (COLACE) 100 MG capsule Take 1 capsule (100 mg total) by mouth 2 (two) times daily. 02/25/14   Nelwyn Salisbury, MD  folic acid (FOLVITE) 1 MG tablet Take 1 tablet (1 mg total) by mouth daily. 10/01/14   Josph Macho, MD  furosemide (LASIX) 40 MG tablet Take 40 mg by mouth as needed.  01/15/13   Nelwyn Salisbury, MD  furosemide (LASIX) 40 MG tablet TAKE 1 TABLET BY MOUTH TWICE A DAY 08/14/14   Nelwyn Salisbury, MD  Multiple Vitamin (MULTI-VITAMIN DAILY PO) Take by mouth every morning.    Historical Provider, MD  omeprazole (PRILOSEC) 40 MG capsule Take 1 capsule (40 mg total) by mouth daily. 01/06/15   Nelwyn Salisbury, MD  oxyCODONE-acetaminophen (PERCOCET/ROXICET) 5-325 MG per tablet Take 1-2 tablets by mouth every 4 (four) hours as needed. 10/01/12   Barnetta Chapel, PA-C  polyethylene glycol (MIRALAX / GLYCOLAX) packet Take 17 g by mouth daily as needed. 09/18/12   Sherrie George, PA-C  sildenafil (VIAGRA) 100 MG tablet Take 1 tablet (100 mg total) by mouth daily as needed for erectile dysfunction. 05/21/14   Nelwyn Salisbury, MD  XARELTO 20 MG TABS tablet TAKE 1 TABLET BY MOUTH EVERY DAY 12/18/14   Nelwyn Salisbury, MD   BP 105/48 mmHg  Pulse 66  Temp(Src) 98 F (36.7 C) (Oral)  Resp 20  Ht 5' 10.5" (1.791 m)  Wt 365 lb (165.563 kg)  BMI 51.61 kg/m2  SpO2 99% Physical Exam  Constitutional: He is oriented to person, place, and time. He appears well-developed and well-nourished. No distress.  HENT:  Head: Normocephalic and atraumatic.  Mouth/Throat: Oropharynx is clear and moist.  No swelling of uvular but reflux in back of throat.   Eyes: Conjunctivae and EOM are normal. Pupils are equal, round, and reactive to light.  Neck: Normal range of motion. Neck supple. No tracheal deviation present.  Cardiovascular: Normal rate, regular rhythm and intact distal pulses.   Pulmonary/Chest: Effort normal and breath sounds normal. No stridor. No respiratory distress. He has no wheezes. He has no  rales. He exhibits no tenderness.  Abdominal: Soft. There is no tenderness. There is no rebound and no guarding.  Hyperactive bowel sounds Constipation Stool in transverse, ascending   Musculoskeletal: Normal range of motion.  Lymphadenopathy:    He has no cervical adenopathy.  Neurological: He is alert and oriented to person, place, and time.  Skin: Skin is warm and dry.  Psychiatric: His mood appears anxious.  Nursing note and vitals reviewed.   ED Course  Procedures (including critical care time) DIAGNOSTIC STUDIES: Oxygen Saturation is  99% on RA, normal by my interpretation.    COORDINATION OF CARE: 11:51 PM- Pt advised of plan for treatment and pt agrees.    Labs Review Labs Reviewed  COMPREHENSIVE METABOLIC PANEL - Abnormal; Notable for the following:    Potassium 3.4 (*)    Glucose, Bld 218 (*)    Calcium 8.2 (*)    Anion gap 0 (*)    All other components within normal limits  PROTIME-INR - Abnormal; Notable for the following:    Prothrombin Time 16.7 (*)    All other components within normal limits  CBC  BRAIN NATRIURETIC PEPTIDE  TROPONIN I  D-DIMER, QUANTITATIVE    Imaging Review Dg Chest 2 View  02/20/2015   CLINICAL DATA:  Short of breath for 2 weeks. History of pulmonary embolism.  EXAM: CHEST  2 VIEW  COMPARISON:  09/16/2012  FINDINGS: Moderate lower thoracic spondylosis. Midline trachea. Mild cardiomegaly. Tortuous descending thoracic aorta. No pleural effusion or pneumothorax. Clear lungs. No congestive failure.  IMPRESSION: Cardiomegaly, without acute disease.   Electronically Signed   By: Jeronimo GreavesKyle  Talbot M.D.   On: 02/20/2015 22:47     EKG Interpretation   Date/Time:  Friday February 20 2015 21:44:53 EST Ventricular Rate:  69 PR Interval:  148 QRS Duration: 86 QT Interval:  380 QTC Calculation: 407 R Axis:   -21 Text Interpretation:  Normal sinus rhythm Nonspecific T wave abnormality  Abnormal ECG since last tracing no significant change Confirmed by  BELFI   MD, MELANIE ([id number]) on 02/20/2015 10:34:26 PM      MDM   Final diagnoses:  None    Based on ongoing symptoms negative EKG and troponin excludes ACS, moreover for his ongoing symptoms he had nuclear stress test within the past 2 days that was negative.  He is taking his xarelto and his ddimer is negative in a low risk patient with normal vitals signs .    Likely GERD and superimposed anxiety.  He states he feel anxious thinking about it.  Follow up with your PMD for ongoing care.  Recommend no peanut butter and complete evacuation of the bowels to decrease intraabdominal pressure  I personally performed the services described in this documentation, which was scribed in my presence. The recorded information has been reviewed and is accurate.    Jasmine AweApril K Abbigayle Toole-Rasch, MD 02/21/15 [phone]

## 2015-02-21 ENCOUNTER — Encounter (HOSPITAL_BASED_OUTPATIENT_CLINIC_OR_DEPARTMENT_OTHER): Payer: Self-pay | Admitting: Emergency Medicine

## 2015-02-26 ENCOUNTER — Other Ambulatory Visit: Payer: Self-pay | Admitting: Family

## 2015-03-16 ENCOUNTER — Other Ambulatory Visit: Payer: Self-pay | Admitting: Family Medicine

## 2015-03-17 ENCOUNTER — Encounter: Payer: Self-pay | Admitting: Nurse Practitioner

## 2015-03-17 ENCOUNTER — Telehealth: Payer: Self-pay | Admitting: Family Medicine

## 2015-03-17 NOTE — Progress Notes (Signed)
[facility]. Confirmation received.

## 2015-03-17 NOTE — Telephone Encounter (Signed)
[facility] are no more refills on his XARELTO 20 MG TABS tablet. Even though it was filled 12/02/14 for 11 refills.  [facility].  Can you check on this? Pt states this is 2nd day he is w/out and develops blood clots easily. Thanks.  [facility]

## 2015-03-17 NOTE — Telephone Encounter (Signed)
I spoke with pharmacy and pt does have refills, they are filling the script now and I did speak with pt.

## 2015-04-05 ENCOUNTER — Other Ambulatory Visit: Payer: Self-pay | Admitting: Family

## 2015-04-16 ENCOUNTER — Other Ambulatory Visit: Payer: Self-pay | Admitting: Family Medicine

## 2015-04-17 NOTE — Telephone Encounter (Signed)
Call in #90 with 2 rf. He needs to see us soon

## 2015-04-18 ENCOUNTER — Other Ambulatory Visit: Payer: Self-pay | Admitting: Family Medicine

## 2015-04-29 ENCOUNTER — Telehealth: Payer: Self-pay | Admitting: Family Medicine

## 2015-04-29 NOTE — Telephone Encounter (Signed)
Pt needs refill on celebrex 200 mg #90 w/refills  call [facility] [place]. Pt is out

## 2015-04-29 NOTE — Telephone Encounter (Signed)
Per Dr. Clent RidgesFry, pt needs office visit. I spoke with pt and he will schedule appointment.

## 2015-05-01 ENCOUNTER — Encounter: Payer: Self-pay | Admitting: Family Medicine

## 2015-05-01 ENCOUNTER — Ambulatory Visit (INDEPENDENT_AMBULATORY_CARE_PROVIDER_SITE_OTHER): Payer: BLUE CROSS/BLUE SHIELD | Admitting: Family Medicine

## 2015-05-01 VITALS — BP 104/61 | HR 78 | Temp 98.2°F | Ht 70.5 in | Wt 379.0 lb

## 2015-05-01 DIAGNOSIS — M5441 Lumbago with sciatica, right side: Secondary | ICD-10-CM

## 2015-05-01 DIAGNOSIS — Z7901 Long term (current) use of anticoagulants: Secondary | ICD-10-CM | POA: Diagnosis not present

## 2015-05-01 DIAGNOSIS — M5442 Lumbago with sciatica, left side: Secondary | ICD-10-CM

## 2015-05-01 DIAGNOSIS — R609 Edema, unspecified: Secondary | ICD-10-CM

## 2015-05-01 MED ORDER — FUROSEMIDE 40 MG PO TABS: 40.0000 mg | ORAL_TABLET | ORAL | Status: DC | PRN

## 2015-05-01 MED ORDER — TRAMADOL HCL 50 MG PO TABS: 100.0000 mg | ORAL_TABLET | Freq: Four times a day (QID) | ORAL | Status: DC | PRN

## 2015-05-01 NOTE — Progress Notes (Signed)
Pre visit review using our clinic review tool, if applicable. No additional management support is needed unless otherwise documented below in the visit note. 

## 2015-05-01 NOTE — Progress Notes (Signed)
   Subjective:    Patient ID: Gregory Cortez, male    DOB: 1956/01/07, 59 y.o.   MRN: [id number]  HPI Here to discuss pain control. He is on lifelong anticoagulation with Xarelto so we want to avoid long term use of NSAIDs. However he has been taking Celebrex to help with pain from his spine and his knees. He had an ESI to the lumbar spine per Dr. Ethelene Halamos 4 weeks ago with mixed results. He has an appt coming up with Dr. Lequita HaltAluisio to discuss his knees, and I am sure total replacements will be the advice. He has put on 33 lbs since i saw him last year, but he says he is watching his diet closely. He attributes the weight gain to not being able to work out at the gym the way he used to. No chest pain or SOB.    Review of Systems  Constitutional: Negative.   Respiratory: Negative.   Cardiovascular: Negative.   Musculoskeletal: Positive for back pain and arthralgias.       Objective:   Physical Exam  Constitutional: He appears well-developed and well-nourished.  He limps as he walks   Cardiovascular: Normal rate, regular rhythm, normal heart sounds and intact distal pulses.   Pulmonary/Chest: Effort normal and breath sounds normal.          Assessment & Plan:  Stop the Celebrex and try Tramadol for pain. He needs to lose weight and I asked him to consider bariatric surgery. He will set up a cpx here soon.

## 2015-05-22 ENCOUNTER — Other Ambulatory Visit: Payer: Self-pay | Admitting: Family Medicine

## 2015-05-23 ENCOUNTER — Other Ambulatory Visit: Payer: Self-pay | Admitting: Family Medicine

## 2015-05-23 NOTE — Telephone Encounter (Signed)
NO, he was given a 3 month rx last month

## 2015-07-02 ENCOUNTER — Other Ambulatory Visit: Payer: Self-pay | Admitting: Family Medicine

## 2015-07-02 NOTE — Telephone Encounter (Signed)
Call in #90 with 5 rf 

## 2015-07-22 ENCOUNTER — Encounter (HOSPITAL_BASED_OUTPATIENT_CLINIC_OR_DEPARTMENT_OTHER): Payer: Self-pay

## 2015-07-22 ENCOUNTER — Observation Stay (HOSPITAL_BASED_OUTPATIENT_CLINIC_OR_DEPARTMENT_OTHER)
Admission: EM | Admit: 2015-07-22 | Discharge: 2015-07-23 | Disposition: A | Discharge status: Home / Self Care | Payer: 59 | Attending: Internal Medicine | Admitting: Internal Medicine

## 2015-07-22 ENCOUNTER — Emergency Department (HOSPITAL_BASED_OUTPATIENT_CLINIC_OR_DEPARTMENT_OTHER): Payer: 59

## 2015-07-22 DIAGNOSIS — K219 Gastro-esophageal reflux disease without esophagitis: Secondary | ICD-10-CM | POA: Diagnosis not present

## 2015-07-22 DIAGNOSIS — R079 Chest pain, unspecified: Secondary | ICD-10-CM | POA: Diagnosis not present

## 2015-07-22 DIAGNOSIS — Z7901 Long term (current) use of anticoagulants: Secondary | ICD-10-CM | POA: Insufficient documentation

## 2015-07-22 DIAGNOSIS — D6852 Prothrombin gene mutation: Secondary | ICD-10-CM | POA: Diagnosis not present

## 2015-07-22 DIAGNOSIS — F419 Anxiety disorder, unspecified: Secondary | ICD-10-CM | POA: Insufficient documentation

## 2015-07-22 DIAGNOSIS — D6859 Other primary thrombophilia: Secondary | ICD-10-CM | POA: Diagnosis present

## 2015-07-22 DIAGNOSIS — G4730 Sleep apnea, unspecified: Secondary | ICD-10-CM | POA: Diagnosis not present

## 2015-07-22 DIAGNOSIS — F411 Generalized anxiety disorder: Secondary | ICD-10-CM

## 2015-07-22 HISTORY — DX: Chest pain, unspecified: R07.9

## 2015-07-22 LAB — COMPREHENSIVE METABOLIC PANEL
ALT: 34 U/L (ref 17–63)
AST: 29 U/L (ref 15–41)
Albumin: 3.7 g/dL (ref 3.5–5.0)
Alkaline Phosphatase: 79 U/L (ref 38–126)
Anion gap: 10 (ref 5–15)
BUN: 12 mg/dL (ref 6–20)
CO2: 26 mmol/L (ref 22–32)
Calcium: 9.2 mg/dL (ref 8.9–10.3)
Chloride: 106 mmol/L (ref 101–111)
Creatinine, Ser: 0.73 mg/dL (ref 0.61–1.24)
GFR calc Af Amer: >60 mL/min (ref >60)
GFR calc non Af Amer: >60 mL/min (ref >60)
Glucose, Bld: 110 mg/dL — ABNORMAL HIGH (ref 65–99)
Potassium: 4 mmol/L (ref 3.5–5.1)
Sodium: 142 mmol/L (ref 135–145)
Total Bilirubin: 0.7 mg/dL (ref 0.3–1.2)
Total Protein: 7.2 g/dL (ref 6.5–8.1)

## 2015-07-22 LAB — CBC WITH DIFFERENTIAL/PLATELET
Basophils Absolute: 0.1 10*3/uL (ref 0.0–0.1)
Basophils Relative: 1 % (ref 0–1)
Eosinophils Absolute: 0.2 10*3/uL (ref 0.0–0.7)
Eosinophils Relative: 3 % (ref 0–5)
HCT: 43.9 % (ref 39.0–52.0)
Hemoglobin: 14.5 g/dL (ref 13.0–17.0)
Lymphocytes Relative: 17 % (ref 12–46)
Lymphs Abs: 1.1 10*3/uL (ref 0.7–4.0)
MCH: 31.5 pg (ref 26.0–34.0)
MCHC: 33 g/dL (ref 30.0–36.0)
MCV: 95.4 fL (ref 78.0–100.0)
Monocytes Absolute: 0.5 10*3/uL (ref 0.1–1.0)
Monocytes Relative: 8 % (ref 3–12)
Neutro Abs: 4.5 10*3/uL (ref 1.7–7.7)
Neutrophils Relative %: 71 % (ref 43–77)
Platelets: 239 10*3/uL (ref 150–400)
RBC: 4.6 MIL/uL (ref 4.22–5.81)
RDW: 12.9 % (ref 11.5–15.5)
WBC: 6.3 10*3/uL (ref 4.0–10.5)

## 2015-07-22 LAB — TROPONIN I
Troponin I: 0.04 ng/mL — ABNORMAL HIGH (ref <0.031)
Troponin I: <0.03 ng/mL (ref <0.031)
Troponin I: <0.03 ng/mL (ref <0.031)
Troponin I: <0.03 ng/mL (ref <0.031)

## 2015-07-22 LAB — LIPASE, BLOOD: Lipase: 28 U/L (ref 22–51)

## 2015-07-22 LAB — PROTIME-INR
INR: 1.19 (ref 0.00–1.49)
Prothrombin Time: 15.3 seconds — ABNORMAL HIGH (ref 11.6–15.2)

## 2015-07-22 LAB — D-DIMER, QUANTITATIVE: D-Dimer, Quant: <0.27 ug/mL-FEU (ref 0.00–0.48)

## 2015-07-22 MED ORDER — HEPARIN (PORCINE) IN NACL 100-0.45 UNIT/ML-% IJ SOLN
1600.0000 [IU]/h | INTRAMUSCULAR | Status: DC
  Administered 2015-07-22: 1600 [IU]/h via INTRAVENOUS

## 2015-07-22 MED ORDER — FOLIC ACID 1 MG PO TABS
1.0000 mg | ORAL_TABLET | Freq: Every day | ORAL | Status: DC
  Administered 2015-07-22: 1 mg via ORAL
  Filled 2015-07-22 (×2): qty 1

## 2015-07-22 MED ORDER — ASPIRIN 81 MG PO CHEW
324.0000 mg | CHEWABLE_TABLET | Freq: Once | ORAL | Status: AC
  Administered 2015-07-22: 324 mg via ORAL
  Filled 2015-07-22: qty 4

## 2015-07-22 MED ORDER — NITROGLYCERIN 0.4 MG SL SUBL
0.4000 mg | SUBLINGUAL_TABLET | SUBLINGUAL | Status: AC | PRN
  Administered 2015-07-22 (×3): 0.4 mg via SUBLINGUAL
  Filled 2015-07-22: qty 1

## 2015-07-22 MED ORDER — ONDANSETRON HCL 4 MG/2ML IJ SOLN: 4.0000 mg | Freq: Four times a day (QID) | INTRAMUSCULAR | Status: DC | PRN

## 2015-07-22 MED ORDER — DOCUSATE SODIUM 100 MG PO CAPS
100.0000 mg | ORAL_CAPSULE | Freq: Two times a day (BID) | ORAL | Status: DC
  Administered 2015-07-23: 100 mg via ORAL
  Filled 2015-07-22 (×2): qty 1

## 2015-07-22 MED ORDER — HEPARIN (PORCINE) IN NACL 100-0.45 UNIT/ML-% IJ SOLN
INTRAMUSCULAR | Status: AC
  Filled 2015-07-22: qty 250

## 2015-07-22 MED ORDER — HEPARIN BOLUS VIA INFUSION
4000.0000 [IU] | Freq: Once | INTRAVENOUS | Status: AC
Start: 2015-07-22 — End: 2015-07-22
  Administered 2015-07-22: 4000 [IU] via INTRAVENOUS

## 2015-07-22 MED ORDER — CYCLOBENZAPRINE HCL 10 MG PO TABS: 10.0000 mg | ORAL_TABLET | Freq: Three times a day (TID) | ORAL | Status: DC | PRN

## 2015-07-22 MED ORDER — GI COCKTAIL ~~LOC~~: 30.0000 mL | Freq: Four times a day (QID) | ORAL | Status: DC | PRN

## 2015-07-22 MED ORDER — HEPARIN (PORCINE) IN NACL 100-0.45 UNIT/ML-% IJ SOLN
2200.0000 [IU]/h | INTRAMUSCULAR | Status: DC
  Administered 2015-07-23: 2200 [IU]/h via INTRAVENOUS
  Filled 2015-07-22: qty 250

## 2015-07-22 MED ORDER — PANTOPRAZOLE SODIUM 40 MG PO TBEC
40.0000 mg | DELAYED_RELEASE_TABLET | Freq: Every day | ORAL | Status: DC
  Administered 2015-07-22: 40 mg via ORAL
  Filled 2015-07-22 (×2): qty 1

## 2015-07-22 MED ORDER — GI COCKTAIL ~~LOC~~
30.0000 mL | Freq: Once | ORAL | Status: AC
  Administered 2015-07-22: 30 mL via ORAL
  Filled 2015-07-22: qty 30

## 2015-07-22 MED ORDER — ACETAMINOPHEN 325 MG PO TABS: 650.0000 mg | ORAL_TABLET | ORAL | Status: DC | PRN

## 2015-07-22 MED ORDER — MORPHINE SULFATE 2 MG/ML IJ SOLN: 1.0000 mg | INTRAMUSCULAR | Status: DC | PRN

## 2015-07-22 MED ORDER — ALPRAZOLAM 0.5 MG PO TABS: 1.0000 mg | ORAL_TABLET | Freq: Three times a day (TID) | ORAL | Status: DC | PRN

## 2015-07-22 NOTE — ED Notes (Signed)
Midsternal CP that sterted 2 days ago while at work.  Associated symptoms include dizziness and SOB.

## 2015-07-22 NOTE — Progress Notes (Addendum)
ANTICOAGULATION CONSULT NOTE - Initial Consult  Pharmacy Consult for Heparin Indication: chest pain/ACS  No Known Allergies  Patient Measurements: Height:  (177.8 cm) Weight: (!) 360 lb (163.295 kg) IBW/kg (Calculated) : 73 Heparin Dosing Weight: 113kg  Vital Signs: Temp: 98.2 F (36.8 C) (08/03 0835) Temp Source: Oral (08/03 0835) BP: 128/81 mmHg (08/03 1157) Pulse Rate: 72 (08/03 1157)  Labs:  Recent Labs  07/22/15 0855  HGB 14.5  HCT 43.9  PLT 239  CREATININE 0.73  TROPONINI 0.04*    Estimated Creatinine Clearance: 159.1 mL/min (by C-G formula based on Cr of 0.73).   Medical History: Past Medical History  Diagnosis Date  . OSA (obstructive sleep apnea)     dr Shelle Iron  . Testosterone deficiency     dr Patsi Sears, shots every 2 weeks  . ED (erectile dysfunction)   . GERD (gastroesophageal reflux disease)     [facility]  . Low back pain     dr Channing Mutters, dr Farris Has, dr Ethelene Hal, herniated disc L4-5  . PE (pulmonary embolism)     bilateral sep 2011 and again bilateral May 2012  . 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 Primary hypercoagulable state     no etiology found per Dr. Shirline Frees   . Hx of colonoscopy   . Pulmonary embolism 02/22/2010    CT angio Dx    Medications:  Scheduled:  . heparin      . heparin  4,000 Units Intravenous Once    Assessment: 59 yo morbidly obesed male admitted with chest pain. He is also on Xarelto s/p recurrent PE's. Last dose of xarelto taken yestereday morning. No SOB, No back pain, no diaphoresis, no dizziness, no numbness and no weakness. [facility] calling for Heparin infusion dose.  Goal of Therapy:  Heparin level 0.3-0.7 units/ml Monitor platelets by anticoagulation protocol: Yes   Plan:  Pt was given 4000 unit bolus of heparin. Heparin 1600 units/hr HL/PTT in 6 hours Baseline labs to be drawn including APTT, Heparin level Daily HL/CBC/PTT until correlates with Heparin level Monitor for s/s of  bleeding,   Pt is to be [facility] to [facility].  Elder Cyphers, BS Pharm D, BCPS Clinical Pharmacist  07/22/2015,1:03 PM

## 2015-07-22 NOTE — ED Provider Notes (Signed)
CSN: [id number]     Arrival date & time 07/22/15  0826 History   First MD Initiated Contact with Patient 07/22/15 [phone]     Chief Complaint  Patient presents with  . Chest Pain     (Consider location/radiation/quality/duration/timing/severity/associated sxs/prior Treatment) HPI Comments: Patient history of morbid obesity, hypercoagulable state status post recurrent PEs, on Xarelto, presents with chest pain. He states that he's been under a lot of stress at work. He works as a Science writer. Over the last 2 days he's had some intermittent chest pain that seems to be worse when he is at work. He describes as a tightness to the center of his chest which radiates through to his back. He has some mild associated shortness of breath. He has some mild intermittent dizziness. He denies any nausea or vomiting. He denies any diaphoresis. His last stress test was about 2 years ago which she says was normal. He denies any history of diabetes, hypertension or hyperlipidemia. He denies any smoking history. He denies any family history of heart disease. He states the pain is not related to exertion. It's not related to eating. He is not taking anything at home for the pain.  Patient is a 59 y.o. male presenting with chest pain.  Chest Pain Associated symptoms: shortness of breath   Associated symptoms: no abdominal pain, no back pain, no cough, no diaphoresis, no dizziness, no fatigue, no fever, no headache, no nausea, no numbness, not vomiting and no weakness     Past Medical History  Diagnosis Date  . OSA (obstructive sleep apnea)     dr Shelle Iron  . Testosterone deficiency     dr Patsi Sears, shots every 2 weeks  . ED (erectile dysfunction)   . GERD (gastroesophageal reflux disease)     [facility]  . Low back pain     dr Channing Mutters, dr Farris Has, dr Ethelene Hal, herniated disc L4-5  . PE (pulmonary embolism)     bilateral sep 2011 and again bilateral May 2012  . 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 Primary  hypercoagulable state     no etiology found per Dr. Shirline Frees   . Hx of colonoscopy   . Pulmonary embolism 02/22/2010    CT angio Dx   Past Surgical History  Procedure Laterality Date  . Knee surgery      both  . Shoulder surgery      right and left  . Esi      L4-5 dr Channing Mutters 03/2010  . Rotator cuff repair  08-20-10    left dr Wyline Mood complicated by PE  . Esophagogastroduodenoscopy      x2 - normal except reflux  . Laparoscopic appendectomy  09/13/2012    Procedure: APPENDECTOMY LAPAROSCOPIC;  Surgeon: Almond Lint, MD;  Location: [facility];  Service: General;  Laterality: N/A;  . Cardiac stress test x 2      Family History  Problem Relation Age of Onset  . Hypertension Other   . Cancer Other     lung  . Coronary artery disease Neg Hx    History  Substance Use Topics  . Smoking status: Never Smoker   . Smokeless tobacco: Never Used     Comment: never used product  . Alcohol Use: No    Review of Systems  Constitutional: Negative for fever, chills, diaphoresis and fatigue.  HENT: Negative for congestion, rhinorrhea and sneezing.   Eyes: Negative.   Respiratory: Positive for shortness of breath. Negative for  cough and chest tightness.   Cardiovascular: Positive for chest pain. Negative for leg swelling.  Gastrointestinal: Negative for nausea, vomiting, abdominal pain, diarrhea and blood in stool.  Genitourinary: Negative for frequency, hematuria, flank pain and difficulty urinating.  Musculoskeletal: Negative for back pain and arthralgias.  Skin: Negative for rash.  Neurological: Positive for light-headedness. Negative for dizziness, speech difficulty, weakness, numbness and headaches.      Allergies  Review of patient's allergies indicates no known allergies.  Home Medications   Prior to Admission medications   Medication Sig Start Date End Date Taking? Authorizing Provider  ALPRAZolam Prudy Feeler) 1 MG tablet TAKE 1 TABLET BY MOUTH 3 TIMES A DAY AS NEEDED 07/03/15   Nelwyn Salisbury, MD  cyclobenzaprine (FLEXERIL) 10 MG tablet Take 1 tablet (10 mg total) by mouth 3 (three) times daily as needed for muscle spasms. 01/27/14   Nelwyn Salisbury, MD  diphenhydramine-acetaminophen (TYLENOL PM) 25-500 MG TABS Take 1 tablet by mouth at bedtime as needed.    Historical Provider, MD  docusate sodium (COLACE) 100 MG capsule Take 1 capsule (100 mg total) by mouth 2 (two) times daily. 02/25/14   Nelwyn Salisbury, MD  folic acid (FOLVITE) 1 MG tablet Take 1 tablet (1 mg total) by mouth daily. 10/01/14   Josph Macho, MD  furosemide (LASIX) 40 MG tablet TAKE 1 TABLET BY MOUTH TWICE A DAY Patient not taking: Reported on 05/01/2015 08/14/14   Nelwyn Salisbury, MD  furosemide (LASIX) 40 MG tablet Take 1 tablet (40 mg total) by mouth as needed. 05/01/15   Nelwyn Salisbury, MD  Multiple Vitamin (MULTI-VITAMIN DAILY PO) Take by mouth every morning.    Historical Provider, MD  omeprazole (PRILOSEC) 40 MG capsule TAKE 1 CAPSULE (40 MG TOTAL) BY MOUTH DAILY. 05/25/15   Nelwyn Salisbury, MD  oxyCODONE-acetaminophen (PERCOCET/ROXICET) 5-325 MG per tablet Take 1-2 tablets by mouth every 4 (four) hours as needed. 10/01/12   Barnetta Chapel, PA-C  polyethylene glycol (MIRALAX / GLYCOLAX) packet Take 17 g by mouth daily as needed. 09/18/12   Sherrie George, PA-C  traMADol (ULTRAM) 50 MG tablet Take 2 tablets (100 mg total) by mouth every 6 (six) hours as needed. 05/01/15   Nelwyn Salisbury, MD  VIAGRA 100 MG tablet TAKE 1 TABLET BY MOUTH EVERY DAY AS NEEDED FOR ED 05/25/15   Nelwyn Salisbury, MD  XARELTO 20 MG TABS tablet TAKE 1 TABLET BY MOUTH EVERY DAY 12/18/14   Nelwyn Salisbury, MD   BP 128/81 mmHg  Pulse 72  Temp(Src) 98.2 F (36.8 C) (Oral)  Resp 18  Ht  (1.778 m)  Wt 360 lb (163.295 kg)  BMI 51.65 kg/m2  SpO2 100% Physical Exam  Constitutional: He is oriented to person, place, and time. He appears well-developed and well-nourished.  HENT:  Head: Normocephalic and atraumatic.  Eyes: Pupils are equal, round, and  reactive to light.  Neck: Normal range of motion. Neck supple.  Cardiovascular: Normal rate, regular rhythm and normal heart sounds.   Pulmonary/Chest: Effort normal and breath sounds normal. No respiratory distress. He has no wheezes. He has no rales. He exhibits no tenderness.  Abdominal: Soft. Bowel sounds are normal. There is tenderness (ositive tenderness to epigastrium and right upper quadrant). There is no rebound and no guarding.  Musculoskeletal: Normal range of motion. He exhibits no edema.  Lymphadenopathy:    He has no cervical adenopathy.  Neurological: He is alert and oriented to person, place,  and time.  Skin: Skin is warm and dry. No rash noted.  Psychiatric: He has a normal mood and affect.    ED Course  Procedures (including critical care time) Labs Review Labs Reviewed  COMPREHENSIVE METABOLIC PANEL - Abnormal; Notable for the following:    Glucose, Bld 110 (*)    All other components within normal limits  TROPONIN I - Abnormal; Notable for the following:    Troponin I 0.04 (*)    All other components within normal limits  LIPASE, BLOOD  CBC WITH DIFFERENTIAL/PLATELET  D-DIMER, QUANTITATIVE (NOT AT [facility])    Imaging Review US Abdomen Complete  07/22/2015   CLINICAL DATA:  Two day history of mid chest and upper abdominal pain radiating to the back.  EXAM: ULTRASOUND ABDOMEN COMPLETE  COMPARISON:  Abdominal series of today's date and abdominal pelvic CT scan of September 29, 2012.  FINDINGS: Gallbladder: No gallstones or wall thickening visualized. No sonographic Murphy sign noted.  Common bile duct: Diameter: 2 mm  Liver: The hepatic echotexture is mildly increased. There is no focal mass nor ductal dilation. The surface contour is normal.  IVC: No abnormality visualized.  Pancreas: Bowel gas limits evaluation of the pancreatic head and tail. No ductal dilation the pancreatic body is observed.  Spleen: Size and appearance within normal limits.  Right Kidney: Length: 14.3  cm. There is a prominent column of Bertin seen in the midpole. Vascularity of this region is normal. There is no hydronephrosis.  Left Kidney: Length: 14.5 cm. There is a dromedary hump involving the mid pole of the kidney. There is no solid mass or hydronephrosis.  Abdominal aorta: Bowel gas limits evaluation of the abdominal aorta. No definite aneurysm is observed.  Other findings: No ascites is demonstrated.  IMPRESSION: 1. No acute hepatobiliary abnormality is demonstrated. Evaluation of the pancreas was limited by bowel gas. 2. There is no hydronephrosis nor suspicious renal mass. 3. Evaluation of the abdominal aorta is limited but no evidence of an aneurysm is observed.   Electronically Signed   By: David  [place] M.D.   On: 07/22/2015 11:15   Dg Abd Acute W/chest  07/22/2015   CLINICAL DATA:  Chest tightness since yesterday. Constipation. Upper abdominal and chest pain.  EXAM: DG ABDOMEN ACUTE W/ 1V CHEST  COMPARISON:  Chest x-ray 02/20/2015  FINDINGS: There is normal bowel gas pattern. No free air. No organomegaly or suspicious calcification. No acute bony abnormality.  Mild cardiomegaly. No confluent airspace opacities, effusions or edema. No acute bony abnormality.  IMPRESSION: Negative abdominal radiographs.  No acute cardiopulmonary disease.   Electronically Signed   By: Charlett Nose M.D.   On: 07/22/2015 09:34     EKG Interpretation   Date/Time:  Wednesday July 22 2015 08:34:42 EDT Ventricular Rate:  72 PR Interval:  142 QRS Duration: 92 QT Interval:  386 QTC Calculation: 422 R Axis:   -20 Text Interpretation:  Normal sinus rhythm Normal ECG since last tracing no  significant change Confirmed by Voyd Groft  MD, Whisper Kurka ([id number]) on 07/22/2015  10:00:37 AM      MDM   Final diagnoses:  Chest pain, unspecified chest pain type    Patient presents with chest pain at rest. His EKG does not show ischemic changes. This was repeated. He had a troponin that was slightly elevated. He did have  some pain in his upper abdomen is ultrasound does not reveal any evidence of gallbladder disease. There is no suggestions of PE. He was given aspirin and  nitroglycerin. His chest pain has improved with the nitroglycerin. He still has 2 out of 10 chest pain. He was started on heparin. I spoke with Dr. Cena Benton with the hospitalist service to admit the patient to [facility] for further cardiac evaluation. He'll be admitted to the transitional unit on telemetry.    Rolan Bucco, MD 07/22/15 1236

## 2015-07-22 NOTE — Progress Notes (Signed)
Pt has home machine, mask and tubing. Will place on himself when ready. Encouraged pt to call RT if needing any assistance

## 2015-07-22 NOTE — ED Notes (Signed)
Pt transported to [facility] via [facility] and stable upon transfer. 

## 2015-07-22 NOTE — ED Notes (Signed)
Patient transported to Ultrasound 

## 2015-07-22 NOTE — ED Notes (Signed)
Admission bed assignment received.

## 2015-07-22 NOTE — ED Notes (Signed)
Report called to [employer].

## 2015-07-22 NOTE — ED Notes (Signed)
Patient transported to radiology

## 2015-07-22 NOTE — H&P (Signed)
[facility] History and Physical  Gregory Cortez ZOX:[id number] DOB: 1956/08/24 DOA: 07/22/2015  Referring physician: EDP PCP: Nelwyn Salisbury, MD   Chief Complaint: Chest pain HPI: Gregory Cortez is a 59 y.o. male with history of morbid obesity, hypercoagulable state, on chronic anticoagulation (Xarelto) for recurrent PEs, OSA, and GERD presented to the ED for chest pain. The pain started on Sunday night and is described as a tightness over the sternal area of his chest that radiates to his shoulder blades. He endorses increased stress at work and has noticed the tightness worsens throughout the day at work with accompanying shortness of breath. Once he is home and able to relax, the tightness resolves. He presented to the ED because the tightness began this morning before work and was aggravated by movement. He has had some intermittent dizziness and felt nauseated last night. The chest tightness is not exertional. He denies diaphoresis and exertional SOB. He endorses occasional heart palpitations, which he has had in the past and was evaluated by 2 stress tests, most recently 2 years ago. He denies any history of DM, HTN, and HLD. He has been taking Xarelto as prescribed. He has been prescribed anxiety medication in the past, but is not taking any.  While in the ED, he received 3 doses of nitroglycerin and 81 mg of aspirin. His pain was 5/10 and is now 0/10. Troponin was mildly elevated at 0.4. D-dimer negative for PE. Other labwork unremarkable. No changes on EKG. He was admitted for observation.   Review of Systems:  Constitutional: No weight loss, night sweats, Fevers, chills, fatigue.  HEENT: + headaches, No Difficulty swallowing,Tooth/dental problems,Sore throat,  No sneezing, itching, ear ache, nasal congestion, post nasal drip,  Cardio-vascular: + chest pain, dizziness, palpitations, No Orthopnea, PND, swelling in lower extremities, anasarca GI: +nausea, No heartburn, indigestion,  abdominal pain, vomiting, diarrhea, change in bowel habits, loss of appetite  Resp: + shortness of breath with chest tightness, No shortness of breath with exertion. No excess mucus, no productive cough, No non-productive cough, No coughing up of blood.No change in color of mucus.No wheezing.No chest wall deformity  Skin: no rash or lesions.  GU: no dysuria, change in color of urine, no urgency or frequency. No flank pain.  Musculoskeletal: No joint pain or swelling. No decreased range of motion. No back pain.  Psych: No change in mood or affect. No depression or anxiety. No memory loss.   Past Medical History  Diagnosis Date  . OSA (obstructive sleep apnea)     dr Shelle Iron  . Testosterone deficiency     dr Patsi Sears, shots every 2 weeks  . ED (erectile dysfunction)   . GERD (gastroesophageal reflux disease)     [facility]  . Low back pain     dr Channing Mutters, dr Farris Has, dr Ethelene Hal, herniated disc L4-5  . PE (pulmonary embolism)     bilateral sep 2011 and again bilateral May 2012  . 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 Primary hypercoagulable state     no etiology found per Dr. Shirline Frees   . Hx of colonoscopy   . Pulmonary embolism 02/22/2010    CT angio Dx   Past Surgical History  Procedure Laterality Date  . Knee surgery      both  . Shoulder surgery      right and left  . Esi      L4-5 dr Channing Mutters 03/2010  . Rotator cuff repair  08-20-10  left dr Wyline Mood complicated by PE  . Esophagogastroduodenoscopy      x2 - normal except reflux  . Laparoscopic appendectomy  09/13/2012    Procedure: APPENDECTOMY LAPAROSCOPIC;  Surgeon: Almond Lint, MD;  Location: [facility];  Service: General;  Laterality: N/A;  . Cardiac stress test x 2      Social History:  reports that he has never smoked. He has never used smokeless tobacco. He reports that he does not drink alcohol or use illicit drugs. He lives at home with his wife, son and grandson. 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  No Known  Allergies  Family History  Problem Relation Age of Onset  . Hypertension Other   . Cancer Other     lung  . Coronary artery disease Neg Hx     Prior to Admission medications   Medication Sig Start Date End Date Taking? Authorizing Provider  ALPRAZolam Prudy Feeler) 1 MG tablet TAKE 1 TABLET BY MOUTH 3 TIMES A DAY AS NEEDED 07/03/15   Nelwyn Salisbury, MD  cyclobenzaprine (FLEXERIL) 10 MG tablet Take 1 tablet (10 mg total) by mouth 3 (three) times daily as needed for muscle spasms. 01/27/14   Nelwyn Salisbury, MD  diphenhydramine-acetaminophen (TYLENOL PM) 25-500 MG TABS Take 1 tablet by mouth at bedtime as needed.    Historical Provider, MD  docusate sodium (COLACE) 100 MG capsule Take 1 capsule (100 mg total) by mouth 2 (two) times daily. 02/25/14   Nelwyn Salisbury, MD  folic acid (FOLVITE) 1 MG tablet Take 1 tablet (1 mg total) by mouth daily. 10/01/14   Josph Macho, MD  furosemide (LASIX) 40 MG tablet TAKE 1 TABLET BY MOUTH TWICE A DAY Patient not taking: Reported on 05/01/2015 08/14/14   Nelwyn Salisbury, MD  furosemide (LASIX) 40 MG tablet Take 1 tablet (40 mg total) by mouth as needed. 05/01/15   Nelwyn Salisbury, MD  Multiple Vitamin (MULTI-VITAMIN DAILY PO) Take by mouth every morning.    Historical Provider, MD  omeprazole (PRILOSEC) 40 MG capsule TAKE 1 CAPSULE (40 MG TOTAL) BY MOUTH DAILY. 05/25/15   Nelwyn Salisbury, MD  oxyCODONE-acetaminophen (PERCOCET/ROXICET) 5-325 MG per tablet Take 1-2 tablets by mouth every 4 (four) hours as needed. 10/01/12   Barnetta Chapel, PA-C  polyethylene glycol (MIRALAX / GLYCOLAX) packet Take 17 g by mouth daily as needed. 09/18/12   Sherrie George, PA-C  traMADol (ULTRAM) 50 MG tablet Take 2 tablets (100 mg total) by mouth every 6 (six) hours as needed. 05/01/15   Nelwyn Salisbury, MD  VIAGRA 100 MG tablet TAKE 1 TABLET BY MOUTH EVERY DAY AS NEEDED FOR ED 05/25/15   Nelwyn Salisbury, MD  XARELTO 20 MG TABS tablet TAKE 1 TABLET BY MOUTH EVERY DAY 12/18/14   Nelwyn Salisbury, MD    Physical Exam: Filed Vitals:   07/22/15 1157 07/22/15 1309 07/22/15 1315 07/22/15 1435  BP: 128/81 127/72  106/79  Pulse: 72 64 60 56  Temp:    97.5 F (36.4 C)  TempSrc:    Oral  Resp: Height:     (1.778 m)  Weight:    166.243 kg (366 lb 8 oz)  SpO2: 100% 95% 95%     Wt Readings from Last 3 Encounters:  07/22/15 166.243 kg (366 lb 8 oz)  05/01/15 171.913 kg (379 lb)  02/20/15 165.563 kg (365 lb)    General:  Appears calm and  comfortable, seated in bed in no acute distress. He is pleasant and conversational. Eyes: normal lids, irises & conjunctiva ENT: grossly normal hearing, lips & tongue Neck: no LAD, masses or thyromegaly Cardiovascular: RRR, no m/r/g. 1+ edema BLE, no JVD Respiratory: CTA bilaterally, no w/r/r. Normal respiratory effort. Abdomen: Bowel sounds present, soft, non-distended, TTP RUQ and epigastric area Skin: no rash or induration seen on limited exam Musculoskeletal: grossly normal tone BUE/BLE Psychiatric: grossly normal mood and affect, speech fluent and appropriate Neurologic: grossly non-focal.          Labs on Admission:  Basic Metabolic Panel:  Recent Labs Lab 07/22/15 0855  NA 142  K 4.0  CL 106  CO2 26  GLUCOSE 110*  BUN 12  CREATININE 0.73  CALCIUM 9.2   Liver Function Tests:  Recent Labs Lab 07/22/15 0855  AST 29  ALT 34  ALKPHOS 79  BILITOT 0.7  PROT 7.2  ALBUMIN 3.7    Recent Labs Lab 07/22/15 0855  LIPASE 28   CBC:  Recent Labs Lab 07/22/15 0855  WBC 6.3  NEUTROABS 4.5  HGB 14.5  HCT 43.9  MCV 95.4  PLT 239   Cardiac Enzymes:  Recent Labs Lab 07/22/15 0855  TROPONINI 0.04*    BNP (last 3 results)  Recent Labs  02/20/15 2151  BNP 14.5    Radiological Exams on Admission: US Abdomen Complete  07/22/2015   CLINICAL DATA:  Two day history of mid chest and upper abdominal pain radiating to the back.  EXAM: ULTRASOUND ABDOMEN COMPLETE  COMPARISON:  Abdominal series of today's  date and abdominal pelvic CT scan of September 29, 2012.  FINDINGS: Gallbladder: No gallstones or wall thickening visualized. No sonographic Murphy sign noted.  Common bile duct: Diameter: 2 mm  Liver: The hepatic echotexture is mildly increased. There is no focal mass nor ductal dilation. The surface contour is normal.  IVC: No abnormality visualized.  Pancreas: Bowel gas limits evaluation of the pancreatic head and tail. No ductal dilation the pancreatic body is observed.  Spleen: Size and appearance within normal limits.  Right Kidney: Length: 14.3 cm. There is a prominent column of Bertin seen in the midpole. Vascularity of this region is normal. There is no hydronephrosis.  Left Kidney: Length: 14.5 cm. There is a dromedary hump involving the mid pole of the kidney. There is no solid mass or hydronephrosis.  Abdominal aorta: Bowel gas limits evaluation of the abdominal aorta. No definite aneurysm is observed.  Other findings: No ascites is demonstrated.  IMPRESSION: 1. No acute hepatobiliary abnormality is demonstrated. Evaluation of the pancreas was limited by bowel gas. 2. There is no hydronephrosis nor suspicious renal mass. 3. Evaluation of the abdominal aorta is limited but no evidence of an aneurysm is observed.   Electronically Signed   By: David  [place] M.D.   On: 07/22/2015 11:15   Dg Abd Acute W/chest  07/22/2015   CLINICAL DATA:  Chest tightness since yesterday. Constipation. Upper abdominal and chest pain.  EXAM: DG ABDOMEN ACUTE W/ 1V CHEST  COMPARISON:  Chest x-ray 02/20/2015  FINDINGS: There is normal bowel gas pattern. No free air. No organomegaly or suspicious calcification. No acute bony abnormality.  Mild cardiomegaly. No confluent airspace opacities, effusions or edema. No acute bony abnormality.  IMPRESSION: Negative abdominal radiographs.  No acute cardiopulmonary disease.   Electronically Signed   By: Charlett Nose M.D.   On: 07/22/2015 09:34    EKG: Independently reviewed. Normal sinus  rhythm.  Assessment/Plan Principal Problem:   Chest pain Active Problems:   Anxiety state   Sleep apnea   GERD   Chronic anticoagulation for h/o Pulmonary embolism   Primary hypercoagulable state   Obesity, Class III, BMI 40-49.9 (morbid obesity)  Chest pain - Chest pain likely due to anxiety, resolved with nitroglycerin from ED and with patient able to relax - Trop elevated at 0.4, NSR on EKG, D-dimer negative for PE, CXR negative. - Serial troponins, repeat EKG, echo, consult cardiology, monitor via telemetry  Anxiety  - Increased stress at work, likely contributing to chest pain-continue with as needed Xanax  GERD - May be contributing to epigastric abdominal pain, on Prilosec  Chronic anticoagulation for h/o pulmonary embolism - Takes Xarelto at home, changed to IV Heparin, dosing per pharmacy-resume Xarelto on discharge once it is clear that the patient does not need any procedures   Primary hypercoagulable state - On chronic anticoagulation, see above  Sleep Apnea  - On Cpap  Code Status: Full code DVT Prophylaxis: Heparin Family Communication: None at bedside Disposition Plan: Admit to inpatient  Time spent: 30 Chanda Busing, PA-S Jeoffrey Massed, MD [facility] Attending MD note  Patient was seen, examined,treatment plan was discussed with the  [facility] Practice Provider.  I have personally reviewed the clinical findings, lab,EKG, imaging studies and management of this patient in detail.I have also reviewed the orders written for this patient which were under my direction. I agree with the documentation, as recorded by the PA-S  Gregory Cortez is a 59 y.o. male with a Past Medical History of pulmonary embolism on chronic anticoagulation presents for evaluation of retrosternal discomfort that he describes as pressure-like sensation. He is under a lot of stress at work, and describes anxiety-like symptoms associated with worsening chest pain. When he goes  home and relaxes the chest pain completely resolves. He is now being admitted for further evaluation and treatment. Agree with IV heparin for now, cycle cardiac enzymes and follow clinical course. May require cardiology consultation is has significant elevation in his cardiac enzymes, EKG benign-appearing. Check echocardiogram.  [facility]

## 2015-07-22 NOTE — ED Notes (Signed)
Pt repositioned with HOB decreased and pt tells me he immediately felt better.  Pain 2/10 now.

## 2015-07-22 NOTE — ED Notes (Signed)
MD at bedside. 

## 2015-07-22 NOTE — ED Notes (Signed)
Report called to Rey, RN Unit nurse.

## 2015-07-23 ENCOUNTER — Observation Stay (HOSPITAL_BASED_OUTPATIENT_CLINIC_OR_DEPARTMENT_OTHER): Payer: 59

## 2015-07-23 ENCOUNTER — Other Ambulatory Visit: Payer: Self-pay | Admitting: Physician Assistant

## 2015-07-23 DIAGNOSIS — R072 Precordial pain: Secondary | ICD-10-CM

## 2015-07-23 DIAGNOSIS — D6852 Prothrombin gene mutation: Secondary | ICD-10-CM

## 2015-07-23 DIAGNOSIS — R0789 Other chest pain: Secondary | ICD-10-CM

## 2015-07-23 DIAGNOSIS — Z7901 Long term (current) use of anticoagulants: Secondary | ICD-10-CM | POA: Diagnosis not present

## 2015-07-23 DIAGNOSIS — R079 Chest pain, unspecified: Secondary | ICD-10-CM

## 2015-07-23 DIAGNOSIS — F411 Generalized anxiety disorder: Secondary | ICD-10-CM | POA: Diagnosis not present

## 2015-07-23 LAB — CBC
HCT: 44 % (ref 39.0–52.0)
Hemoglobin: 14.6 g/dL (ref 13.0–17.0)
MCH: 31.5 pg (ref 26.0–34.0)
MCHC: 33.2 g/dL (ref 30.0–36.0)
MCV: 95 fL (ref 78.0–100.0)
Platelets: 230 10*3/uL (ref 150–400)
RBC: 4.63 MIL/uL (ref 4.22–5.81)
RDW: 12.9 % (ref 11.5–15.5)
WBC: 6.6 10*3/uL (ref 4.0–10.5)

## 2015-07-23 LAB — APTT
aPTT: 41 seconds — ABNORMAL HIGH (ref 24–37)
aPTT: 88 seconds — ABNORMAL HIGH (ref 24–37)

## 2015-07-23 LAB — HEPARIN LEVEL (UNFRACTIONATED)
Heparin Unfractionated: 0.26 IU/mL — ABNORMAL LOW (ref 0.30–0.70)
Heparin Unfractionated: 0.82 IU/mL — ABNORMAL HIGH (ref 0.30–0.70)

## 2015-07-23 MED ORDER — RIVAROXABAN 20 MG PO TABS
20.0000 mg | ORAL_TABLET | Freq: Every day | ORAL | Status: DC
  Administered 2015-07-23: 20 mg via ORAL
  Filled 2015-07-23: qty 1

## 2015-07-23 NOTE — Progress Notes (Signed)
ANTICOAGULATION CONSULT NOTE - Follow Up Consult  Pharmacy Consult for heparin Indication: CP and h/o PE   Labs:  Recent Labs  07/22/15 0855 07/22/15 1818 07/22/15 2040 07/22/15 2245  HGB 14.5  --   --   --   HCT 43.9  --   --   --   PLT 239  --   --   --   APTT  --   --   --  41*  LABPROT 15.3*  --   --   --   INR 1.19  --   --   --   HEPARINUNFRC  --   --   --  0.26*  CREATININE 0.73  --   --   --   TROPONINI 0.04* <0.03 <0.03 <0.03    Assessment: 59yo male subtherapeutic on heparin with initial dosing for CP while Xarelto on hold for possible procedures; using PTT for dosing while Xarelto clears (affects heparin levels).  Goal of Therapy:  Heparin level 0.3-0.7 units/ml aPTT 66-102 seconds   Plan:  Will increase heparin gtt by 4 units/kg/hr to 2200 units/hr and check level in 6hr.  Vernard Gambles, PharmD, BCPS  07/23/2015,12:17 AM

## 2015-07-23 NOTE — Discharge Summary (Signed)
Physician Discharge Summary  Gregory Cortez MRN: [id number] DOB/AGE: 1956-02-08 59 y.o.  PCP: Laurey Morale, MD   Admit date: 07/22/2015 Discharge date: 07/23/2015  Discharge Diagnoses:     Principal Problem:   Chest pain Active Problems:   Anxiety state   Sleep apnea   GERD   Chronic anticoagulation for h/o Pulmonary embolism   Primary hypercoagulable state   Obesity, Class III, BMI 40-49.9 (morbid obesity)    Follow-up recommendations Follow-up with PCP in 3-5 days , including although additional recommended appointments as below Follow-up CBC, CMP in 3-5 days new outpatient sleep apnea follow-up with Dr. Ellouise Newer Patient needs to have an outpatient stress test     Medication List    STOP taking these medications        VIAGRA 100 MG tablet  Generic drug:  sildenafil      TAKE these medications        ALPRAZolam 1 MG tablet  Commonly known as:  XANAX  TAKE 1 TABLET BY MOUTH 3 TIMES A DAY AS NEEDED     diphenhydramine-acetaminophen 25-500 MG Tabs  Commonly known as:  TYLENOL PM  Take 2 tablets by mouth at bedtime.     docusate sodium 100 MG capsule  Commonly known as:  COLACE  Take 1 capsule (100 mg total) by mouth 2 (two) times daily.     folic acid 1 MG tablet  Commonly known as:  FOLVITE  Take 1 tablet (1 mg total) by mouth daily.     furosemide 40 MG tablet  Commonly known as:  LASIX  Take 1 tablet (40 mg total) by mouth as needed.     methocarbamol 500 MG tablet  Commonly known as:  ROBAXIN  Take 500 mg by mouth every 8 (eight) hours as needed. for pain     MULTI-VITAMIN DAILY PO  Take by mouth every morning.     omeprazole 40 MG capsule  Commonly known as:  PRILOSEC  TAKE 1 CAPSULE (40 MG TOTAL) BY MOUTH DAILY.     OVER THE COUNTER MEDICATION  Take 1 tablet by mouth daily.     oxyCODONE-acetaminophen 10-325 MG per tablet  Commonly known as:  PERCOCET  Take 1 tablet by mouth 4 (four) times daily as needed for pain.     polyethylene  glycol packet  Commonly known as:  MIRALAX / GLYCOLAX  Take 17 g by mouth daily as needed.     traMADol 50 MG tablet  Commonly known as:  ULTRAM  Take 2 tablets (100 mg total) by mouth every 6 (six) hours as needed.     XARELTO 20 MG Tabs tablet  Generic drug:  rivaroxaban  TAKE 1 TABLET BY MOUTH EVERY DAY         Discharge Condition: Stable  Disposition: 01-Home or Self Care   Consults: * Cardiology     Significant Diagnostic Studies:  US Abdomen Complete  07/22/2015   CLINICAL DATA:  Two day history of mid chest and upper abdominal pain radiating to the back.  EXAM: ULTRASOUND ABDOMEN COMPLETE  COMPARISON:  Abdominal series of today's date and abdominal pelvic CT scan of September 29, 2012.  FINDINGS: Gallbladder: No gallstones or wall thickening visualized. No sonographic Murphy sign noted.  Common bile duct: Diameter: 2 mm  Liver: The hepatic echotexture is mildly increased. There is no focal mass nor ductal dilation. The surface contour is normal.  IVC: No abnormality visualized.  Pancreas: Bowel gas limits evaluation of the pancreatic head  and tail. No ductal dilation the pancreatic body is observed.  Spleen: Size and appearance within normal limits.  Right Kidney: Length: 14.3 cm. There is a prominent column of Bertin seen in the midpole. Vascularity of this region is normal. There is no hydronephrosis.  Left Kidney: Length: 14.5 cm. There is a dromedary hump involving the mid pole of the kidney. There is no solid mass or hydronephrosis.  Abdominal aorta: Bowel gas limits evaluation of the abdominal aorta. No definite aneurysm is observed.  Other findings: No ascites is demonstrated.  IMPRESSION: 1. No acute hepatobiliary abnormality is demonstrated. Evaluation of the pancreas was limited by bowel gas. 2. There is no hydronephrosis nor suspicious renal mass. 3. Evaluation of the abdominal aorta is limited but no evidence of an aneurysm is observed.   Electronically Signed   By:  David  Martinique M.D.   On: 07/22/2015 11:15   Dg Abd Acute W/chest  07/22/2015   CLINICAL DATA:  Chest tightness since yesterday. Constipation. Upper abdominal and chest pain.  EXAM: DG ABDOMEN ACUTE W/ 1V CHEST  COMPARISON:  Chest x-ray 02/20/2015  FINDINGS: There is normal bowel gas pattern. No free air. No organomegaly or suspicious calcification. No acute bony abnormality.  Mild cardiomegaly. No confluent airspace opacities, effusions or edema. No acute bony abnormality.  IMPRESSION: Negative abdominal radiographs.  No acute cardiopulmonary disease.   Electronically Signed   By: Rolm Baptise M.D.   On: 07/22/2015 09:34       Filed Weights   07/22/15 0835 07/22/15 1435 07/23/15 0516  Weight: 163.295 kg (360 lb) 166.243 kg (366 lb 8 oz) 166.833 kg (367 lb 12.8 oz)     Microbiology: No results found for this or any previous visit (from the past 240 hour(s)).     Blood Culture    Component Value Date/Time   SDES PERITONEAL CAVITY 09/23/2012 1122   SDES PERITONEAL CAVITY 09/23/2012 1122   SPECREQUEST Normal 09/23/2012 1122   SPECREQUEST Normal 09/23/2012 1122   CULT MODERATE ESCHERICHIA COLI 09/23/2012 1122   CULT NO ANAEROBES ISOLATED 09/23/2012 1122   REPTSTATUS 09/26/2012 FINAL 09/23/2012 1122   REPTSTATUS 09/28/2012 FINAL 09/23/2012 1122      Labs: Results for orders placed or performed during the hospital encounter of 07/22/15 (from the past 48 hour(s))  Comprehensive metabolic panel     Status: Abnormal   Collection Time: 07/22/15  8:55 AM  Result Value Ref Range   Sodium 142 135 - 145 mmol/L   Potassium 4.0 3.5 - 5.1 mmol/L   Chloride 106 101 - 111 mmol/L   CO2 26 22 - 32 mmol/L   Glucose, Bld 110 (H) 65 - 99 mg/dL   BUN 12 6 - 20 mg/dL   Creatinine, Ser 0.73 0.61 - 1.24 mg/dL   Calcium 9.2 8.9 - 10.3 mg/dL   Total Protein 7.2 6.5 - 8.1 g/dL   Albumin 3.7 3.5 - 5.0 g/dL   AST 29 15 - 41 U/L   ALT 34 17 - 63 U/L   Alkaline Phosphatase 79 38 - 126 U/L   Total  Bilirubin 0.7 0.3 - 1.2 mg/dL   GFR calc non Af Amer >60 >60 mL/min   GFR calc Af Amer >60 >60 mL/min    Comment: (NOTE) The eGFR has been calculated using the CKD EPI equation. This calculation has not been validated in all clinical situations. eGFR's persistently <60 mL/min signify possible Chronic Kidney Disease.    Anion gap 10 5 - 15  Lipase,  blood     Status: None   Collection Time: 07/22/15  8:55 AM  Result Value Ref Range   Lipase 28 22 - 51 U/L  Troponin I     Status: Abnormal   Collection Time: 07/22/15  8:55 AM  Result Value Ref Range   Troponin I 0.04 (H) <0.031 ng/mL    Comment:        PERSISTENTLY INCREASED TROPONIN VALUES IN THE RANGE OF 0.04-0.49 ng/mL CAN BE SEEN IN:       -UNSTABLE ANGINA       -CONGESTIVE HEART FAILURE       -MYOCARDITIS       -CHEST TRAUMA       -ARRYHTHMIAS       -LATE PRESENTING MYOCARDIAL INFARCTION       -COPD   CLINICAL FOLLOW-UP RECOMMENDED.   CBC with Differential     Status: None   Collection Time: 07/22/15  8:55 AM  Result Value Ref Range   WBC 6.3 4.0 - 10.5 K/uL   RBC 4.60 4.22 - 5.81 MIL/uL   Hemoglobin 14.5 13.0 - 17.0 g/dL   HCT 43.9 39.0 - 52.0 %   MCV 95.4 78.0 - 100.0 fL   MCH 31.5 26.0 - 34.0 pg   MCHC 33.0 30.0 - 36.0 g/dL   RDW 12.9 11.5 - 15.5 %   Platelets 239 150 - 400 K/uL   Neutrophils Relative % 71 43 - 77 %   Neutro Abs 4.5 1.7 - 7.7 K/uL   Lymphocytes Relative 17 12 - 46 %   Lymphs Abs 1.1 0.7 - 4.0 K/uL   Monocytes Relative 8 3 - 12 %   Monocytes Absolute 0.5 0.1 - 1.0 K/uL   Eosinophils Relative 3 0 - 5 %   Eosinophils Absolute 0.2 0.0 - 0.7 K/uL   Basophils Relative 1 0 - 1 %   Basophils Absolute 0.1 0.0 - 0.1 K/uL    Comment: Performed at [facility]  D-dimer, quantitative (not at [facility])     Status: None   Collection Time: 07/22/15  8:55 AM  Result Value Ref Range   D-Dimer, Quant <0.27 0.00 - 0.48 ug/mL-FEU    Comment:        AT THE INHOUSE ESTABLISHED CUTOFF VALUE OF 0.48 ug/mL  FEU, THIS ASSAY HAS BEEN DOCUMENTED IN THE LITERATURE TO HAVE A SENSITIVITY AND NEGATIVE PREDICTIVE VALUE OF AT LEAST 98 TO 99%.  THE TEST RESULT SHOULD BE CORRELATED WITH AN ASSESSMENT OF THE CLINICAL PROBABILITY OF DVT / VTE.   Protime-INR     Status: Abnormal   Collection Time: 07/22/15  8:55 AM  Result Value Ref Range   Prothrombin Time 15.3 (H) 11.6 - 15.2 seconds   INR 1.19 0.00 - 1.49  Troponin I-serum (0, 3, 6 hours)     Status: None   Collection Time: 07/22/15  6:18 PM  Result Value Ref Range   Troponin I <0.03 <0.031 ng/mL    Comment:        NO INDICATION OF MYOCARDIAL INJURY.   Troponin I-serum (0, 3, 6 hours)     Status: None   Collection Time: 07/22/15  8:40 PM  Result Value Ref Range   Troponin I <0.03 <0.031 ng/mL    Comment:        NO INDICATION OF MYOCARDIAL INJURY.   Troponin I-serum (0, 3, 6 hours)     Status: None   Collection Time: 07/22/15 10:45 PM  Result Value Ref Range  Troponin I <0.03 <0.031 ng/mL    Comment:        NO INDICATION OF MYOCARDIAL INJURY.   Heparin level (unfractionated)     Status: Abnormal   Collection Time: 07/22/15 10:45 PM  Result Value Ref Range   Heparin Unfractionated 0.26 (L) 0.30 - 0.70 IU/mL    Comment:        IF HEPARIN RESULTS ARE BELOW EXPECTED VALUES, AND PATIENT DOSAGE HAS BEEN CONFIRMED, SUGGEST FOLLOW UP TESTING OF ANTITHROMBIN III LEVELS.   APTT     Status: Abnormal   Collection Time: 07/22/15 10:45 PM  Result Value Ref Range   aPTT 41 (H) 24 - 37 seconds    Comment:        IF BASELINE aPTT IS ELEVATED, SUGGEST PATIENT RISK ASSESSMENT BE USED TO DETERMINE APPROPRIATE ANTICOAGULANT THERAPY.   Heparin level (unfractionated)     Status: Abnormal   Collection Time: 07/23/15  6:48 AM  Result Value Ref Range   Heparin Unfractionated 0.82 (H) 0.30 - 0.70 IU/mL    Comment:        IF HEPARIN RESULTS ARE BELOW EXPECTED VALUES, AND PATIENT DOSAGE HAS BEEN CONFIRMED, SUGGEST FOLLOW UP TESTING OF  ANTITHROMBIN III LEVELS.   APTT     Status: Abnormal   Collection Time: 07/23/15  6:48 AM  Result Value Ref Range   aPTT 88 (H) 24 - 37 seconds    Comment:        IF BASELINE aPTT IS ELEVATED, SUGGEST PATIENT RISK ASSESSMENT BE USED TO DETERMINE APPROPRIATE ANTICOAGULANT THERAPY.   CBC     Status: None   Collection Time: 07/23/15  6:48 AM  Result Value Ref Range   WBC 6.6 4.0 - 10.5 K/uL   RBC 4.63 4.22 - 5.81 MIL/uL   Hemoglobin 14.6 13.0 - 17.0 g/dL   HCT 44.0 39.0 - 52.0 %   MCV 95.0 78.0 - 100.0 fL   MCH 31.5 26.0 - 34.0 pg   MCHC 33.2 30.0 - 36.0 g/dL   RDW 12.9 11.5 - 15.5 %   Platelets 230 150 - 400 K/uL     Lipid Panel     Component Value Date/Time   CHOL  09/03/2010 0515    185        ATP III CLASSIFICATION:  <200     mg/dL   Desirable  200-239  mg/dL   Borderline High  >=240    mg/dL   High          TRIG 160* 09/03/2010 0515   HDL 25* 09/03/2010 0515   CHOLHDL 7.4 09/03/2010 0515   VLDL 32 09/03/2010 0515   LDLCALC * 09/03/2010 0515    128        Total Cholesterol/HDL:CHD Risk Coronary Heart Disease Risk Table                     Men   Women  1/2 Average Risk   3.4   3.3  Average Risk       5.0   4.4  2 X Average Risk   9.6   7.1  3 X Average Risk  23.4   11.0        Use the calculated Patient Ratio above and the CHD Risk Table to determine the patient's CHD Risk.        ATP III CLASSIFICATION (LDL):  <100     mg/dL   Optimal  100-129  mg/dL   Near or Above  Optimal  130-159  mg/dL   Borderline  160-189  mg/dL   High  >190     mg/dL   Very High   LDLDIRECT 174.9 06/10/2010 0903     No results found for: HGBA1C   Lab Results  Component Value Date   LDLCALC * 09/03/2010    128        Total Cholesterol/HDL:CHD Risk Coronary Heart Disease Risk Table                     Men   Women  1/2 Average Risk   3.4   3.3  Average Risk       5.0   4.4  2 X Average Risk   9.6   7.1  3 X Average Risk  23.4   11.0        Use  the calculated Patient Ratio above and the CHD Risk Table to determine the patient's CHD Risk.        ATP III CLASSIFICATION (LDL):  <100     mg/dL   Optimal  100-129  mg/dL   Near or Above                    Optimal  130-159  mg/dL   Borderline  160-189  mg/dL   High  >190     mg/dL   Very High   CREATININE 0.73 07/22/2015     HPI : 59 y.o. male with a past medical history significant for obstructive sleep apnea and recurrent pulmonary embolism on chronic anticoagulation therapy presents with recurrent chest pain associated with emotional stress. He does not experience chest pain when exercising at the gym. His electrocardiogram has been normal. His initial cardiac troponin was borderline at 0.04 x 3 subsequent assays have all been completely normal. He is currently asymptomatic.  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 He has been working long hours including the weekends.  He is compliant with CPAP but his mask has recently broken. He is using an even older mask and needs a replacement. He is to see Dr. Gwenette Greet for obstructive sleep apnea, currently does not have a sleep specialty M.D. He has been compliant with Xarelto and has not missed any doses. He has no symptoms of calf swelling or tenderness and denies cough or hemoptysis or pleurisy. Not had fever. As mentioned, his chest pain is never exertional.  He has fairly limited risk factors for coronary artery disease, mostly male gender and extreme obesity. He does not have diabetes mellitus, hypertension and has never smoked.  He had a normal nuclear stress test in October 2014. Note the absence of any obvious coronary artery calcifications and previous chest CT performed for pulmonary embolism.   HOSPITAL COURSE:    Chest pain 1. Atypical chest discomfort with low risk findings on ECG and biochemical markers  and a normal nuclear stress test within the last 12 months and absent coronary artery calcification on non-dedicated chest CT angiography. Patient has been seen by Sanda Klein, MD, [facility] - Trop elevated at 0.4, NSR on EKG, D-dimer negative for PE, CXR negative. - Serial troponins, repeat EKG, echo pending,  Cardiology recommends no further inpatient workup, they do recommend outpatient treadmill stress test ECG , and then follow-up with  Sanda Klein, MD, [facility]  Anxiety  - Increased  stress at work, likely contributing to chest pain-continue with as needed Xanax  GERD - May be contributing to epigastric abdominal pain, on Prilosec  Chronic anticoagulation for h/o pulmonary embolism Continue Xarelto at home,    Primary hypercoagulable state - On chronic anticoagulation, see above  Sleep Apnea  - On Cpap, patient needs to have replacement of   CPAP mask and arrange a new outpatient sleep apnea follow-up with Dr. Ellouise Newer   Discharge Exam:    Blood pressure 128/79, pulse 63, temperature 98 F (36.7 C), temperature source Oral, resp. rate 18, height _0  (1.778 m), weight 166.833 kg (367 lb 12.8 oz), SpO2 96 %.  General: Alert, oriented x3, no distress Head: no evidence of trauma, PERRL, EOMI, no exophtalmos or lid lag, no myxedema, no xanthelasma; normal ears, nose and oropharynx Neck: Normal jugular venous pulsations and no hepatojugular reflux; brisk carotid pulses without delay and no carotid bruits Chest: clear to auscultation, no signs of consolidation by percussion or palpation, normal fremitus, symmetrical and full respiratory excursions Cardiovascular: normal position and quality of the apical impulse, regular rhythm, normal first heart sound and normal second heart sound, no rubs or gallops, no murmur Abdomen: no tenderness or distention, no masses by palpation, no abnormal pulsatility or arterial bruits, normal bowel sounds, no hepatosplenomegaly Extremities: no clubbing,  cyanosis; no edema; 2+ radial, ulnar and brachial pulses bilaterally; 2+ right femoral, posterior tibial and dorsalis pedis pulses; 2+ left femoral, posterior tibial and dorsalis pedis pulses; no subclavian or femoral bruits Neurological: grossly nonfocal       Discharge Instructions    Diet - low sodium heart healthy    Complete by:  As directed      Increase activity slowly    Complete by:  As directed            Follow-up Information    Follow up with FRY,STEPHEN A, MD. Schedule an appointment as soon as possible for a visit in 1 week.   Specialty:  Family Medicine   Contact information:   [place] [address] [phone]       Signed: Reyne Dumas 07/23/2015, 10:52 AM        Time spent >45 mins

## 2015-07-23 NOTE — Discharge Instructions (Addendum)
Information on my medicine - XARELTO (rivaroxaban)  This medication education was reviewed with me or my healthcare representative as part of my discharge preparation.  The pharmacist that spoke with me during my hospital stay was:  Arman Filter, [facility]   WHY WAS Gregory Cortez PRESCRIBED FOR YOU? Continued your prior to admission medication Xarelto for past medical history of blood clots. Xarelto was previously prescribed to treat blood clots that may have been found in the veins of your legs (deep vein thrombosis) or in your lungs (pulmonary embolism) and to reduce the risk of them occurring again.  What do you need to know about Xarelto? Continue your Xarelto 20 mg tablet taken ONCE A DAY with your evening meal.  DO NOT stop taking Xarelto without talking to the health care provider who prescribed the medication.  Refill your prescription for 20 mg tablets before you run out.  After discharge, you should have regular check-up appointments with your healthcare provider that is prescribing your Xarelto.  In the future your dose may need to be changed if your kidney function changes by a significant amount.  What do you do if you miss a dose? If you are taking Xarelto TWICE DAILY and you miss a dose, take it as soon as you remember. You may take two 15 mg tablets (total 30 mg) at the same time then resume your regularly scheduled 15 mg twice daily the next day.  If you are taking Xarelto ONCE DAILY and you miss a dose, take it as soon as you remember on the same day then continue your regularly scheduled once daily regimen the next day. Do not take two doses of Xarelto at the same time.   Important Safety Information Xarelto is a blood thinner medicine that can cause bleeding. You should call your healthcare provider right away if you experience any of the following: ? Bleeding from an injury or your nose that does not stop. ? Unusual colored urine (red or dark brown) or unusual  colored stools (red or black). ? Unusual bruising for unknown reasons. ? A serious fall or if you hit your head (even if there is no bleeding).  Some medicines may interact with Xarelto and might increase your risk of bleeding while on Xarelto. To help avoid this, consult your healthcare provider or pharmacist prior to using any new prescription or non-prescription medications, including herbals, vitamins, non-steroidal anti-inflammatory drugs (NSAIDs) and supplements.  This website has more information on Xarelto: VisitDestination.com.br.

## 2015-07-23 NOTE — Progress Notes (Signed)
  Echocardiogram 2D Echocardiogram has been performed.  Gregory Cortez 07/23/2015, 12:17 PM

## 2015-07-23 NOTE — Progress Notes (Signed)
ANTICOAGULATION CONSULT NOTE - Follow Up Consult  Pharmacy Consult for heparin (now to dc heparin & restart Xarelto) Indication: CP and h/o PE   Labs:  Recent Labs  07/22/15 0855 07/22/15 1818 07/22/15 2040 07/22/15 2245 07/23/15 0648  HGB 14.5  --   --   --  14.6  HCT 43.9  --   --   --  44.0  PLT 239  --   --   --  230  APTT  --   --   --  41* 88*  LABPROT 15.3*  --   --   --   --   INR 1.19  --   --   --   --   HEPARINUNFRC  --   --   --  0.26* 0.82*  CREATININE 0.73  --   --   --   --   TROPONINI 0.04* <0.03 <0.03 <0.03  --     Assessment: 59yo male admitted with CP. He is currently on IV heparin drip, converted from PTA Xarelto due to possibility of cardiac procedure. PTA Xarelto dose  qSupper for h/o PE. APTT 88 sec, heparin level 0.82, therapeutic PTT on hepairn 2200 units/hr. No bleeding noed. CBC stable. Cardiac consulted today and does not recommend any further inpatient cardiac workup. Dr. Susie Cassette wants to dc IV heparin and restart Xarelto for h/o PE  Goal of Therapy:  Heparin level 0.3-0.7 units/ml aPTT 66-102 seconds   Plan:  DC IV heparin drip now and restart Xarelto  daily now.  Instruct patient do not take Xarelto toight but tomorrow resume Xarelto  every evening with supper   Thank you for allowing pharmacy to be part of this patients care team. [name], RPh Clinical Pharmacist Pager: [phone] 07/23/2015,10:55 AM

## 2015-07-23 NOTE — Consult Note (Signed)
Reason for Consult: Chest pain  Requesting Physician: Susie Cassette  Cardiologist: None  HPI: This is a 59 y.o. male with a past medical history significant for obstructive sleep apnea and recurrent pulmonary embolism on chronic anticoagulation therapy presents with recurrent chest pain associated with emotional stress. He does not experience chest pain when exercising at the gym. His electrocardiogram has been normal. His initial cardiac troponin was borderline at 0.04 x 3 subsequent assays have all been completely normal. He is currently asymptomatic.  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 He has been working long hours including the weekends.  He is compliant with CPAP but his mask has recently broken. He is using an even older mask and needs a replacement. He is to see Dr. Shelle Iron for obstructive sleep apnea, currently does not have a sleep specialty M.D. He has been compliant with Xarelto and has not missed any doses. He has no symptoms of calf swelling or tenderness and denies cough or hemoptysis or pleurisy. Not had fever. As mentioned, his chest pain is never exertional.  He has fairly limited risk factors for coronary artery disease, mostly male gender and extreme obesity. He does not have diabetes mellitus, hypertension and has never smoked.  He had a normal nuclear stress test in October 2014. Note the absence of any obvious coronary artery calcifications and previous chest CT performed for pulmonary embolism.  PMHx:  Past Medical History  Diagnosis Date  . OSA (obstructive sleep apnea)     dr Shelle Iron  . Testosterone deficiency     dr Patsi Sears, shots every 2 weeks  . ED (erectile dysfunction)   . GERD (gastroesophageal reflux disease)     [facility]  . Low back pain     dr Channing Mutters, dr Farris Has, dr Ethelene Hal, herniated disc L4-5  . PE (pulmonary embolism)    bilateral sep 2011 and again bilateral May 2012  . 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 Primary hypercoagulable state     no etiology found per Dr. Shirline Frees   . Hx of colonoscopy   . Pulmonary embolism 02/22/2010    CT angio Dx  . Chest pain    Past Surgical History  Procedure Laterality Date  . Knee surgery      both  . Shoulder surgery      right and left  . Esi      L4-5 dr Channing Mutters 03/2010  . Rotator cuff repair  08-20-10    left dr Wyline Mood complicated by PE  . Esophagogastroduodenoscopy      x2 - normal except reflux  . Laparoscopic appendectomy  09/13/2012    Procedure: APPENDECTOMY LAPAROSCOPIC;  Surgeon: Almond Lint, MD;  Location: [facility];  Service: General;  Laterality: N/A;  . Cardiac stress test x 2       FAMHx: Family History  Problem Relation Age of Onset  . Hypertension Other   . Cancer Other     lung  . Coronary artery disease Neg Hx     SOCHx:  reports that he has never smoked. He has never used smokeless tobacco. He reports that he does not drink alcohol or use illicit drugs.  ALLERGIES: No Known Allergies  ROS: Pertinent items are noted in HPI.  HOME MEDICATIONS: Prescriptions prior to admission  Medication Sig Dispense Refill Last  Dose  . ALPRAZolam (XANAX) 1 MG tablet TAKE 1 TABLET BY MOUTH 3 TIMES A DAY AS NEEDED (Patient taking differently: TAKE 1 TABLET BY MOUTH 3 TIMES A DAY AS NEEDED for anxiety) 90 tablet 5 Past Month at Unknown time  . diphenhydramine-acetaminophen (TYLENOL PM) 25-500 MG TABS Take 2 tablets by mouth at bedtime.    07/21/2015 at Unknown time  . docusate sodium (COLACE) 100 MG capsule Take 1 capsule (100 mg total) by mouth 2 (two) times daily. 2 capsule 0 07/21/2015 at Unknown time  . folic acid (FOLVITE) 1 MG tablet Take 1 tablet (1 mg total) by mouth daily. 30 tablet 9 07/21/2015 at Unknown time  . furosemide (LASIX) 40 MG tablet Take 1 tablet (40 mg total) by mouth as needed. (Patient taking differently: Take 40 mg by mouth as needed  for fluid. ) 90 tablet 3 Past Month at Unknown time  . Multiple Vitamin (MULTI-VITAMIN DAILY PO) Take by mouth every morning.   07/21/2015 at Unknown time  . omeprazole (PRILOSEC) 40 MG capsule TAKE 1 CAPSULE (40 MG TOTAL) BY MOUTH DAILY. 30 capsule 3 07/21/2015 at Unknown time  . OVER THE COUNTER MEDICATION Take 1 tablet by mouth daily.   Past Week at Unknown time  . oxyCODONE-acetaminophen (PERCOCET) 10-325 MG per tablet Take 1 tablet by mouth 4 (four) times daily as needed for pain.   0 Past Week at Unknown time  . traMADol (ULTRAM) 50 MG tablet Take 2 tablets (100 mg total) by mouth every 6 (six) hours as needed. (Patient taking differently: Take 100 mg by mouth every 6 (six) hours as needed for moderate pain. ) 240 tablet 2 Past Week at Unknown time  . XARELTO 20 MG TABS tablet TAKE 1 TABLET BY MOUTH EVERY DAY 30 tablet 11 07/21/2015 at Unknown time  . methocarbamol (ROBAXIN) 500 MG tablet Take 500 mg by mouth every 8 (eight) hours as needed. for pain  0 3 weeks  . polyethylene glycol (MIRALAX / GLYCOLAX) packet Take 17 g by mouth daily as needed. 1 each  07/17/2015  . VIAGRA 100 MG tablet TAKE 1 TABLET BY MOUTH EVERY DAY AS NEEDED FOR ED 10 tablet 10 unknown    HOSPITAL MEDICATIONS: I have reviewed the patient's current medications. Prior to Admission:  Prescriptions prior to admission  Medication Sig Dispense Refill Last Dose  . ALPRAZolam (XANAX) 1 MG tablet TAKE 1 TABLET BY MOUTH 3 TIMES A DAY AS NEEDED (Patient taking differently: TAKE 1 TABLET BY MOUTH 3 TIMES A DAY AS NEEDED for anxiety) 90 tablet 5 Past Month at Unknown time  . diphenhydramine-acetaminophen (TYLENOL PM) 25-500 MG TABS Take 2 tablets by mouth at bedtime.    07/21/2015 at Unknown time  . docusate sodium (COLACE) 100 MG capsule Take 1 capsule (100 mg total) by mouth 2 (two) times daily. 2 capsule 0 07/21/2015 at Unknown time  . folic acid (FOLVITE) 1 MG tablet Take 1 tablet (1 mg total) by mouth daily. 30 tablet 9 07/21/2015 at  Unknown time  . furosemide (LASIX) 40 MG tablet Take 1 tablet (40 mg total) by mouth as needed. (Patient taking differently: Take 40 mg by mouth as needed for fluid. ) 90 tablet 3 Past Month at Unknown time  . Multiple Vitamin (MULTI-VITAMIN DAILY PO) Take by mouth every morning.   07/21/2015 at Unknown time  . omeprazole (PRILOSEC) 40 MG capsule TAKE 1 CAPSULE (40 MG TOTAL) BY MOUTH DAILY. 30 capsule 3 07/21/2015 at Unknown time  .  OVER THE COUNTER MEDICATION Take 1 tablet by mouth daily.   Past Week at Unknown time  . oxyCODONE-acetaminophen (PERCOCET) 10-325 MG per tablet Take 1 tablet by mouth 4 (four) times daily as needed for pain.   0 Past Week at Unknown time  . traMADol (ULTRAM) 50 MG tablet Take 2 tablets (100 mg total) by mouth every 6 (six) hours as needed. (Patient taking differently: Take 100 mg by mouth every 6 (six) hours as needed for moderate pain. ) 240 tablet 2 Past Week at Unknown time  . XARELTO 20 MG TABS tablet TAKE 1 TABLET BY MOUTH EVERY DAY 30 tablet 11 07/21/2015 at Unknown time  . methocarbamol (ROBAXIN) 500 MG tablet Take 500 mg by mouth every 8 (eight) hours as needed. for pain  0 3 weeks  . polyethylene glycol (MIRALAX / GLYCOLAX) packet Take 17 g by mouth daily as needed. 1 each  07/17/2015  . VIAGRA 100 MG tablet TAKE 1 TABLET BY MOUTH EVERY DAY AS NEEDED FOR ED 10 tablet 10 unknown   Scheduled: . docusate sodium  100 mg Oral BID  . folic acid  1 mg Oral Daily  . pantoprazole  40 mg Oral Daily    VITALS: Blood pressure 128/79, pulse 63, temperature 98 F (36.7 C), temperature source Oral, resp. rate 18, height 5\' 10"  (1.778 m), weight 367 lb 12.8 oz (166.833 kg), SpO2 96 %.  PHYSICAL EXAM:  General: Alert, oriented x3, no distress Head: no evidence of trauma, PERRL, EOMI, no exophtalmos or lid lag, no myxedema, no xanthelasma; normal ears, nose and oropharynx Neck: Normal jugular venous pulsations and no hepatojugular reflux; brisk carotid pulses without delay and  no carotid bruits Chest: clear to auscultation, no signs of consolidation by percussion or palpation, normal fremitus, symmetrical and full respiratory excursions Cardiovascular: normal position and quality of the apical impulse, regular rhythm, normal first heart sound and normal second heart sound, no rubs or gallops, no murmur Abdomen: no tenderness or distention, no masses by palpation, no abnormal pulsatility or arterial bruits, normal bowel sounds, no hepatosplenomegaly Extremities: no clubbing, cyanosis;  no edema; 2+ radial, ulnar and brachial pulses bilaterally; 2+ right femoral, posterior tibial and dorsalis pedis pulses; 2+ left femoral, posterior tibial and dorsalis pedis pulses; no subclavian or femoral bruits Neurological: grossly nonfocal   LABS  CBC  Recent Labs  07/22/15 0855 07/23/15 0648  WBC 6.3 6.6  NEUTROABS 4.5  --   HGB 14.5 14.6  HCT 43.9 44.0  MCV 95.4 95.0  PLT 239 230   Basic Metabolic Panel  Recent Labs  07/22/15 0855  NA 142  K 4.0  CL 106  CO2 26  GLUCOSE 110*  BUN 12  CREATININE 0.73  CALCIUM 9.2   Liver Function Tests  Recent Labs  07/22/15 0855  AST 29  ALT 34  ALKPHOS 79  BILITOT 0.7  PROT 7.2  ALBUMIN 3.7    Recent Labs  07/22/15 0855  LIPASE 28   Cardiac Enzymes  Recent Labs  07/22/15 1818 07/22/15 2040 07/22/15 2245  TROPONINI <0.03 <0.03 <0.03   BNP Invalid input(s): POCBNP D-Dimer  Recent Labs  07/22/15 0855  DDIMER <0.27   Hemoglobin A1C No results for input(s): HGBA1C in the last 72 hours. Fasting Lipid Panel No results for input(s): CHOL, HDL, LDLCALC, TRIG, CHOLHDL, LDLDIRECT in the last 72 hours. Thyroid Function Tests No results for input(s): TSH, T4TOTAL, T3FREE, THYROIDAB in the last 72 hours.  Invalid input(s): FREET3  IMAGING: US Abdomen Complete  07/22/2015   CLINICAL DATA:  Two day history of mid chest and upper abdominal pain radiating to the back.  EXAM: ULTRASOUND ABDOMEN  COMPLETE  COMPARISON:  Abdominal series of today's date and abdominal pelvic CT scan of September 29, 2012.  FINDINGS: Gallbladder: No gallstones or wall thickening visualized. No sonographic Murphy sign noted.  Common bile duct: Diameter: 2 mm  Liver: The hepatic echotexture is mildly increased. There is no focal mass nor ductal dilation. The surface contour is normal.  IVC: No abnormality visualized.  Pancreas: Bowel gas limits evaluation of the pancreatic head and tail. No ductal dilation the pancreatic body is observed.  Spleen: Size and appearance within normal limits.  Right Kidney: Length: 14.3 cm. There is a prominent column of Bertin seen in the midpole. Vascularity of this region is normal. There is no hydronephrosis.  Left Kidney: Length: 14.5 cm. There is a dromedary hump involving the mid pole of the kidney. There is no solid mass or hydronephrosis.  Abdominal aorta: Bowel gas limits evaluation of the abdominal aorta. No definite aneurysm is observed.  Other findings: No ascites is demonstrated.  IMPRESSION: 1. No acute hepatobiliary abnormality is demonstrated. Evaluation of the pancreas was limited by bowel gas. 2. There is no hydronephrosis nor suspicious renal mass. 3. Evaluation of the abdominal aorta is limited but no evidence of an aneurysm is observed.   Electronically Signed   By: David  [place] M.D.   On: 07/22/2015 11:15   Dg Abd Acute W/chest  07/22/2015   CLINICAL DATA:  Chest tightness since yesterday. Constipation. Upper abdominal and chest pain.  EXAM: DG ABDOMEN ACUTE W/ 1V CHEST  COMPARISON:  Chest x-ray 02/20/2015  FINDINGS: There is normal bowel gas pattern. No free air. No organomegaly or suspicious calcification. No acute bony abnormality.  Mild cardiomegaly. No confluent airspace opacities, effusions or edema. No acute bony abnormality.  IMPRESSION: Negative abdominal radiographs.  No acute cardiopulmonary disease.   Electronically Signed   By: Charlett Nose M.D.   On: 07/22/2015  09:34    ECG: Normal sinus rhythm, no repolarization abnormalities  TELEMETRY:  no arrhythmia  IMPRESSION: 1. Atypical chest discomfort with low risk findings on ECG and biochemical markers and a normal nuclear stress test within the last 12 months and absent coronary artery calcification on non-dedicated chest CT angiography.  RECOMMENDATION: 1. No further inpatient workup recommended. Reasonable to perform an outpatient treadmill stress test ECG, then f/u with me. 2. We'll try to facilitate replacement of his CPAP mask and arrange a new outpatient sleep apnea follow-up with Dr. Daphene Jaeger  Time Spent Directly with Patient: 45  minutes  Thurmon Fair, MD, [facility] [phone] office [phone] pager   07/23/2015, 9:32 AM

## 2015-07-23 NOTE — Care Management Note (Signed)
Case Management Note Donn Pierini RN, BSN Unit 2W-Case Manager [phone] Covering 3W  Patient Details  Name: Gregory Cortez MRN: [id number] Date of Birth: 15-Nov-1956  Subjective/Objective:       Pt admitted with chest pain             Action/Plan: PTA pt lived at home with spouse- has CPAP with [facility]- needs new mask- MD placed order in epic - call made to [facility] with [facility] for new CPAP mask- per conversation with [name] he will send in order to main office for new mask. Main office will f/u with pt  Expected Discharge Date:    07/23/15              Expected Discharge Plan:  Home/Self Care  In-House Referral:     Discharge planning Services  CM Consult  Post Acute Care Choice:  Durable Medical Equipment Choice offered to:     DME Arranged:   CPAP new mask DME Agency:  [facility]  HH Arranged:    HH Agency:     Status of Service:  Completed, signed off  Medicare Important Message Given:    Date Medicare IM Given:    Medicare IM give by:    Date Additional Medicare IM Given:    Additional Medicare Important Message give by:     If discussed at Long Length of Stay Meetings, dates discussed:    Additional Comments:  Darrold Span, RN 07/23/2015, 10:53 AM

## 2015-07-28 ENCOUNTER — Telehealth (HOSPITAL_COMMUNITY): Payer: Self-pay

## 2015-07-28 NOTE — Telephone Encounter (Signed)
Encounter complete. 

## 2015-07-29 ENCOUNTER — Telehealth: Payer: Self-pay | Admitting: Physician Assistant

## 2015-07-29 ENCOUNTER — Inpatient Hospital Stay (HOSPITAL_COMMUNITY): Admission: RE | Admit: 2015-07-29 | Payer: BLUE CROSS/BLUE SHIELD | Source: Ambulatory Visit

## 2015-07-30 NOTE — Telephone Encounter (Signed)
Close encounter 

## 2015-08-03 ENCOUNTER — Ambulatory Visit: Payer: BLUE CROSS/BLUE SHIELD | Admitting: Physician Assistant

## 2015-08-04 ENCOUNTER — Ambulatory Visit: Payer: BLUE CROSS/BLUE SHIELD | Admitting: Family Medicine

## 2015-08-04 ENCOUNTER — Telehealth: Payer: Self-pay | Admitting: Family Medicine

## 2015-08-04 NOTE — Telephone Encounter (Signed)
Pt was on schedule to see Dr. Clent Ridges today for a hospital follow up. I spoke with pt and he thought the appointment was scheduled for next Tuesday 08/11/15.

## 2015-08-04 NOTE — Telephone Encounter (Signed)
Go ahead and reschedule him sometime soon

## 2015-08-18 ENCOUNTER — Encounter: Payer: Self-pay | Admitting: Family Medicine

## 2015-08-18 ENCOUNTER — Inpatient Hospital Stay (HOSPITAL_COMMUNITY): Admission: RE | Admit: 2015-08-18 | Payer: BLUE CROSS/BLUE SHIELD | Source: Ambulatory Visit

## 2015-08-18 ENCOUNTER — Ambulatory Visit (INDEPENDENT_AMBULATORY_CARE_PROVIDER_SITE_OTHER): Payer: 59 | Admitting: Family Medicine

## 2015-08-18 VITALS — BP 110/60 | HR 70 | Temp 98.9°F | Ht 70.0 in | Wt 366.0 lb

## 2015-08-18 DIAGNOSIS — F411 Generalized anxiety disorder: Secondary | ICD-10-CM

## 2015-08-18 DIAGNOSIS — G4730 Sleep apnea, unspecified: Secondary | ICD-10-CM | POA: Diagnosis not present

## 2015-08-18 DIAGNOSIS — R079 Chest pain, unspecified: Secondary | ICD-10-CM

## 2015-08-18 DIAGNOSIS — Z7901 Long term (current) use of anticoagulants: Secondary | ICD-10-CM | POA: Diagnosis not present

## 2015-08-18 MED ORDER — PREDNISONE 10 MG PO TABS: 10.0000 mg | ORAL_TABLET | Freq: Every day | ORAL | Status: DC

## 2015-08-18 NOTE — Progress Notes (Signed)
   Subjective:    Patient ID: Gregory Cortez, male    DOB: 21-Sep-1956, 59 y.o.   MRN: [id number]  HPI Here to follow up a hospital stay from 07-22-15 to 07-23-15 for chest pains. He had been getting occasional chest pains at rest that were not associated with exertion. In fact he can workout at the gym and he feels great, with no chest pains at all. His testing revealed a single slightly elevated cardiac enzyme of questionable significance. He had a normal ECHO. He remains on chronic anticoagulation for a hx of PEs. He thinks his chest pains were due to stress because they would only come when he was anxious about his job. Now that he has turned in his notice to quit the current job, he feels great. He has a sense of relief, and he has had no further chest pains. 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 He hopes this will open before Christmas. It was suggested that he have an outpatient treadmill test, but he did have a nuclear stress test that was normal in 2014.    Review of Systems  Constitutional: Negative.   Respiratory: Negative.   Cardiovascular: Negative.   Endocrine: Negative.   Neurological: Negative.   Psychiatric/Behavioral: Negative for hallucinations, behavioral problems, confusion, sleep disturbance, dysphoric mood, decreased concentration and agitation. The patient is nervous/anxious.        Objective:   Physical Exam  Constitutional: He is oriented to person, place, and time. He appears well-developed and well-nourished.  Neck: No thyromegaly present.  Cardiovascular: Normal rate, regular rhythm, normal heart sounds and intact distal pulses.   Pulmonary/Chest: Effort normal and breath sounds normal.  Lymphadenopathy:    He has no cervical adenopathy.  Neurological: He is alert and oriented to person, place, and time.  Psychiatric: He has a normal mood and affect. His behavior is normal. Thought content normal.           Assessment & Plan:  His chest pains seem to have resolved, and I agree with him that stress was probably the cause of them. I will set up a follow up visit for him to see Dr. Royann Shivers, and I will let him decide if more stress testing is necessary. In the meantime Tara will continue to exercise and work on weight loss. He will set up a cpx with [place] soon.

## 2015-08-18 NOTE — Progress Notes (Signed)
Pre visit review using our clinic review tool, if applicable. No additional management support is needed unless otherwise documented below in the visit note. 

## 2015-08-20 ENCOUNTER — Encounter (HOSPITAL_COMMUNITY): Payer: BLUE CROSS/BLUE SHIELD

## 2015-08-23 ENCOUNTER — Other Ambulatory Visit: Payer: Self-pay | Admitting: Hematology & Oncology

## 2015-09-07 ENCOUNTER — Other Ambulatory Visit (INDEPENDENT_AMBULATORY_CARE_PROVIDER_SITE_OTHER): Payer: 59

## 2015-09-07 ENCOUNTER — Encounter: Payer: Self-pay | Admitting: Family Medicine

## 2015-09-07 ENCOUNTER — Ambulatory Visit (INDEPENDENT_AMBULATORY_CARE_PROVIDER_SITE_OTHER): Payer: BLUE CROSS/BLUE SHIELD | Admitting: Family Medicine

## 2015-09-07 VITALS — BP 115/67 | HR 81 | Temp 99.7°F | Ht 70.0 in | Wt 373.0 lb

## 2015-09-07 DIAGNOSIS — B349 Viral infection, unspecified: Secondary | ICD-10-CM | POA: Diagnosis not present

## 2015-09-07 DIAGNOSIS — Z0000 Encounter for general adult medical examination without abnormal findings: Secondary | ICD-10-CM

## 2015-09-07 LAB — CBC WITH DIFFERENTIAL/PLATELET
Basophils Absolute: 0 10*3/uL (ref 0.0–0.1)
Basophils Relative: 0.7 % (ref 0.0–3.0)
Eosinophils Absolute: 0 10*3/uL (ref 0.0–0.7)
Eosinophils Relative: 0.4 % (ref 0.0–5.0)
HCT: 45.3 % (ref 39.0–52.0)
Hemoglobin: 15.3 g/dL (ref 13.0–17.0)
Lymphocytes Relative: 15.5 % (ref 12.0–46.0)
Lymphs Abs: 0.6 10*3/uL — ABNORMAL LOW (ref 0.7–4.0)
MCHC: 33.7 g/dL (ref 30.0–36.0)
MCV: 94.9 fl (ref 78.0–100.0)
Monocytes Absolute: 0.4 10*3/uL (ref 0.1–1.0)
Monocytes Relative: 9.3 % (ref 3.0–12.0)
Neutro Abs: 3 10*3/uL (ref 1.4–7.7)
Neutrophils Relative %: 74.1 % (ref 43.0–77.0)
Platelets: 158 10*3/uL (ref 150.0–400.0)
RBC: 4.78 Mil/uL (ref 4.22–5.81)
RDW: 13.9 % (ref 11.5–15.5)
WBC: 4 10*3/uL (ref 4.0–10.5)

## 2015-09-07 LAB — TSH: TSH: 1.43 u[IU]/mL (ref 0.35–4.50)

## 2015-09-07 LAB — BASIC METABOLIC PANEL
BUN: 9 mg/dL (ref 6–23)
CO2: 30 mEq/L (ref 19–32)
Calcium: 8.7 mg/dL (ref 8.4–10.5)
Chloride: 104 mEq/L (ref 96–112)
Creatinine, Ser: 0.76 mg/dL (ref 0.40–1.50)
GFR: 112.43 mL/min (ref >60.00)
Glucose, Bld: 126 mg/dL — ABNORMAL HIGH (ref 70–99)
Potassium: 4.3 mEq/L (ref 3.5–5.1)
Sodium: 140 mEq/L (ref 135–145)

## 2015-09-07 LAB — LIPID PANEL
Cholesterol: 195 mg/dL (ref 0–200)
HDL: 31.7 mg/dL — ABNORMAL LOW (ref >39.00)
LDL Cholesterol: 140 mg/dL — ABNORMAL HIGH (ref 0–99)
NonHDL: 163.24
Total CHOL/HDL Ratio: 6
Triglycerides: 118 mg/dL (ref 0.0–149.0)
VLDL: 23.6 mg/dL (ref 0.0–40.0)

## 2015-09-07 LAB — POCT URINALYSIS DIPSTICK
Bilirubin, UA: NEGATIVE
Glucose, UA: NEGATIVE
Ketones, UA: NEGATIVE
Leukocytes, UA: NEGATIVE
Nitrite, UA: NEGATIVE
Spec Grav, UA: >=1.03
Urobilinogen, UA: 1
pH, UA: 6

## 2015-09-07 LAB — HEPATIC FUNCTION PANEL
ALT: 60 U/L — ABNORMAL HIGH (ref 0–53)
AST: 30 U/L (ref 0–37)
Albumin: 3.7 g/dL (ref 3.5–5.2)
Alkaline Phosphatase: 77 U/L (ref 39–117)
Bilirubin, Direct: 0.2 mg/dL (ref 0.0–0.3)
Total Bilirubin: 0.8 mg/dL (ref 0.2–1.2)
Total Protein: 6.6 g/dL (ref 6.0–8.3)

## 2015-09-07 LAB — PSA: PSA: 0.65 ng/mL (ref 0.10–4.00)

## 2015-09-07 NOTE — Progress Notes (Signed)
Pre visit review using our clinic review tool, if applicable. No additional management support is needed unless otherwise documented below in the visit note. 

## 2015-09-07 NOTE — Progress Notes (Signed)
   Subjective:    Patient ID: Gregory Cortez, male    DOB: October 12, 1956, 59 y.o.   MRN: [id number]  HPI Here for 3 days of mild abdominal cramps, diarrhea, headache, ST, and low grade fevers. No nausea or vomiting and no coughing. Drinking fluids.    Review of Systems  Constitutional: Positive for fever.  HENT: Positive for congestion. Negative for postnasal drip and sinus pressure.   Eyes: Negative.   Respiratory: Negative.   Cardiovascular: Negative.   Gastrointestinal: Positive for abdominal pain and diarrhea. Negative for nausea, vomiting, blood in stool, abdominal distention, anal bleeding and rectal pain.       Objective:   Physical Exam  Constitutional: No distress.  HENT:  Right Ear: External ear normal.  Left Ear: External ear normal.  Nose: Nose normal.  Mouth/Throat: Oropharynx is clear and moist.  Eyes: Conjunctivae are normal.  Pulmonary/Chest: Effort normal and breath sounds normal.  Abdominal: Soft. Bowel sounds are normal. He exhibits no distension and no mass. There is no tenderness. There is no rebound and no guarding.  Lymphadenopathy:    He has no cervical adenopathy.          Assessment & Plan:  Viral illness. Use Tylenol and Imodium prn. Written out of work today and tomorrow

## 2015-09-09 ENCOUNTER — Telehealth: Payer: Self-pay | Admitting: Family Medicine

## 2015-09-09 NOTE — Telephone Encounter (Signed)
I spoke with pt and he will call back to schedule office visit. Dr. Clent Ridges is aware of this.

## 2015-09-09 NOTE — Telephone Encounter (Signed)
Patient Name: Gregory Cortez  DOB: [birth year]    Initial Comment Caller states did the Fasting blood and urine test on Monday; dizzy; vomiting   Nurse Assessment  Nurse: Scarlette Ar, RN, Heather Date/Time (Eastern Time): 09/09/2015 9:37:16 AM  Confirm and document reason for call. If symptomatic, describe symptoms. ---Caller states did the Fasting blood and urine test on Monday; dizzy like vertigo; vomiting  Has the patient traveled out of the country within the last 30 days? ---Not Applicable  Does the patient require triage? ---Yes  Related visit to physician within the last 2 weeks? ---No  Does the PT have any chronic conditions? (i.e. diabetes, asthma, etc.) ---Yes  List chronic conditions. ---see MR     Guidelines    Guideline Title Affirmed Question Affirmed Notes  Dizziness - Vertigo Severe headache (e.g., excruciating) (Exception: similar to previous migraines)    Final Disposition User   Go to ED Now (or PCP triage) Standifer, RN, Herbert Seta    Comments  Caller states that he was just in the office on Monday and had lab work and urine done and wants to know the results of that and he wants someone from the office to call him instead of him having to go back in to be seen. Informed office, [phone].   Referrals  GO TO FACILITY REFUSED   Disagree/Comply: Disagree  Disagree/Comply Reason: Disagree with instructions

## 2015-09-09 NOTE — Telephone Encounter (Signed)
See my result note about his labs. He has mild diabetes but this would not cause dizziness and a headache. Have him see [place] OV today about this

## 2015-09-13 ENCOUNTER — Observation Stay (HOSPITAL_COMMUNITY): Payer: 59

## 2015-09-13 ENCOUNTER — Inpatient Hospital Stay (HOSPITAL_COMMUNITY)
Admission: EM | Admit: 2015-09-13 | Discharge: 2015-09-15 | DRG: 189 | Disposition: A | Discharge status: Home / Self Care | Payer: 59 | Attending: Internal Medicine | Admitting: Internal Medicine

## 2015-09-13 ENCOUNTER — Emergency Department (HOSPITAL_COMMUNITY): Payer: 59

## 2015-09-13 ENCOUNTER — Encounter (HOSPITAL_COMMUNITY): Payer: Self-pay

## 2015-09-13 DIAGNOSIS — R079 Chest pain, unspecified: Secondary | ICD-10-CM

## 2015-09-13 DIAGNOSIS — K3533 Acute appendicitis with perforation and localized peritonitis, with abscess: Secondary | ICD-10-CM

## 2015-09-13 DIAGNOSIS — K219 Gastro-esophageal reflux disease without esophagitis: Secondary | ICD-10-CM

## 2015-09-13 DIAGNOSIS — J9601 Acute respiratory failure with hypoxia: Secondary | ICD-10-CM | POA: Diagnosis not present

## 2015-09-13 DIAGNOSIS — R0902 Hypoxemia: Secondary | ICD-10-CM

## 2015-09-13 DIAGNOSIS — Z86711 Personal history of pulmonary embolism: Secondary | ICD-10-CM

## 2015-09-13 DIAGNOSIS — G4730 Sleep apnea, unspecified: Secondary | ICD-10-CM

## 2015-09-13 DIAGNOSIS — J189 Pneumonia, unspecified organism: Secondary | ICD-10-CM | POA: Diagnosis present

## 2015-09-13 DIAGNOSIS — R5383 Other fatigue: Secondary | ICD-10-CM

## 2015-09-13 DIAGNOSIS — F419 Anxiety disorder, unspecified: Secondary | ICD-10-CM | POA: Diagnosis present

## 2015-09-13 DIAGNOSIS — D649 Anemia, unspecified: Secondary | ICD-10-CM

## 2015-09-13 DIAGNOSIS — E662 Morbid (severe) obesity with alveolar hypoventilation: Secondary | ICD-10-CM | POA: Diagnosis present

## 2015-09-13 DIAGNOSIS — M545 Low back pain, unspecified: Secondary | ICD-10-CM | POA: Diagnosis present

## 2015-09-13 DIAGNOSIS — N529 Male erectile dysfunction, unspecified: Secondary | ICD-10-CM

## 2015-09-13 DIAGNOSIS — Z79899 Other long term (current) drug therapy: Secondary | ICD-10-CM

## 2015-09-13 DIAGNOSIS — D6859 Other primary thrombophilia: Secondary | ICD-10-CM

## 2015-09-13 DIAGNOSIS — M5126 Other intervertebral disc displacement, lumbar region: Secondary | ICD-10-CM | POA: Diagnosis present

## 2015-09-13 DIAGNOSIS — J40 Bronchitis, not specified as acute or chronic: Secondary | ICD-10-CM | POA: Diagnosis present

## 2015-09-13 DIAGNOSIS — Z86718 Personal history of other venous thrombosis and embolism: Secondary | ICD-10-CM

## 2015-09-13 DIAGNOSIS — M25569 Pain in unspecified knee: Secondary | ICD-10-CM | POA: Diagnosis present

## 2015-09-13 DIAGNOSIS — Z7901 Long term (current) use of anticoagulants: Secondary | ICD-10-CM | POA: Diagnosis not present

## 2015-09-13 DIAGNOSIS — Z888 Allergy status to other drugs, medicaments and biological substances status: Secondary | ICD-10-CM

## 2015-09-13 DIAGNOSIS — E66813 Obesity, class 3: Secondary | ICD-10-CM

## 2015-09-13 DIAGNOSIS — K6819 Other retroperitoneal abscess: Secondary | ICD-10-CM

## 2015-09-13 DIAGNOSIS — D509 Iron deficiency anemia, unspecified: Secondary | ICD-10-CM | POA: Diagnosis present

## 2015-09-13 DIAGNOSIS — F411 Generalized anxiety disorder: Secondary | ICD-10-CM

## 2015-09-13 DIAGNOSIS — E291 Testicular hypofunction: Secondary | ICD-10-CM | POA: Diagnosis present

## 2015-09-13 DIAGNOSIS — R1032 Left lower quadrant pain: Secondary | ICD-10-CM

## 2015-09-13 DIAGNOSIS — M5441 Lumbago with sciatica, right side: Secondary | ICD-10-CM

## 2015-09-13 DIAGNOSIS — Z6841 Body Mass Index (BMI) 40.0 and over, adult: Secondary | ICD-10-CM

## 2015-09-13 DIAGNOSIS — M5442 Lumbago with sciatica, left side: Secondary | ICD-10-CM

## 2015-09-13 DIAGNOSIS — R609 Edema, unspecified: Secondary | ICD-10-CM

## 2015-09-13 LAB — COMPREHENSIVE METABOLIC PANEL
ALT: 46 U/L (ref 17–63)
AST: 41 U/L (ref 15–41)
Albumin: 2.8 g/dL — ABNORMAL LOW (ref 3.5–5.0)
Alkaline Phosphatase: 65 U/L (ref 38–126)
Anion gap: 7 (ref 5–15)
BUN: 8 mg/dL (ref 6–20)
CO2: 24 mmol/L (ref 22–32)
Calcium: 8.5 mg/dL — ABNORMAL LOW (ref 8.9–10.3)
Chloride: 102 mmol/L (ref 101–111)
Creatinine, Ser: 0.94 mg/dL (ref 0.61–1.24)
GFR calc Af Amer: >60 mL/min (ref >60)
GFR calc non Af Amer: >60 mL/min (ref >60)
Glucose, Bld: 157 mg/dL — ABNORMAL HIGH (ref 65–99)
Potassium: 3.7 mmol/L (ref 3.5–5.1)
Sodium: 133 mmol/L — ABNORMAL LOW (ref 135–145)
Total Bilirubin: 0.9 mg/dL (ref 0.3–1.2)
Total Protein: 6.6 g/dL (ref 6.5–8.1)

## 2015-09-13 LAB — CBC WITH DIFFERENTIAL/PLATELET
Basophils Absolute: 0.1 10*3/uL (ref 0.0–0.1)
Basophils Relative: 1 %
Eosinophils Absolute: 0.1 10*3/uL (ref 0.0–0.7)
Eosinophils Relative: 1 %
HCT: 38.6 % — ABNORMAL LOW (ref 39.0–52.0)
Hemoglobin: 12.9 g/dL — ABNORMAL LOW (ref 13.0–17.0)
Lymphocytes Relative: 33 %
Lymphs Abs: 2.1 10*3/uL (ref 0.7–4.0)
MCH: 31.6 pg (ref 26.0–34.0)
MCHC: 33.4 g/dL (ref 30.0–36.0)
MCV: 94.6 fL (ref 78.0–100.0)
Monocytes Absolute: 0.8 10*3/uL (ref 0.1–1.0)
Monocytes Relative: 12 %
Neutro Abs: 3.5 10*3/uL (ref 1.7–7.7)
Neutrophils Relative %: 53 %
Platelets: 260 10*3/uL (ref 150–400)
RBC: 4.08 MIL/uL — ABNORMAL LOW (ref 4.22–5.81)
RDW: 13.4 % (ref 11.5–15.5)
WBC: 6.6 10*3/uL (ref 4.0–10.5)

## 2015-09-13 LAB — URINALYSIS, ROUTINE W REFLEX MICROSCOPIC
Bilirubin Urine: NEGATIVE
Glucose, UA: NEGATIVE mg/dL
Ketones, ur: NEGATIVE mg/dL
Leukocytes, UA: NEGATIVE
Nitrite: NEGATIVE
Protein, ur: NEGATIVE mg/dL
Specific Gravity, Urine: <1.005 — ABNORMAL LOW (ref 1.005–1.030)
Urobilinogen, UA: 2 mg/dL — ABNORMAL HIGH (ref 0.0–1.0)
pH: 5.5 (ref 5.0–8.0)

## 2015-09-13 LAB — TSH: TSH: 1.93 u[IU]/mL (ref 0.350–4.500)

## 2015-09-13 LAB — TROPONIN I: Troponin I: <0.03 ng/mL (ref <0.031)

## 2015-09-13 LAB — FERRITIN: Ferritin: 1249 ng/mL — ABNORMAL HIGH (ref 24–336)

## 2015-09-13 LAB — IRON AND TIBC
Iron: 35 ug/dL — ABNORMAL LOW (ref 45–182)
Saturation Ratios: 16 % — ABNORMAL LOW (ref 17.9–39.5)
TIBC: 220 ug/dL — ABNORMAL LOW (ref 250–450)
UIBC: 185 ug/dL

## 2015-09-13 LAB — VITAMIN B12: Vitamin B-12: 607 pg/mL (ref 180–914)

## 2015-09-13 LAB — RETICULOCYTES
RBC.: 3.92 MIL/uL — ABNORMAL LOW (ref 4.22–5.81)
Retic Count, Absolute: 62.7 10*3/uL (ref 19.0–186.0)
Retic Ct Pct: 1.6 % (ref 0.4–3.1)

## 2015-09-13 LAB — FOLATE: Folate: 29.1 ng/mL (ref >5.9)

## 2015-09-13 LAB — LIPASE, BLOOD: Lipase: 35 U/L (ref 22–51)

## 2015-09-13 LAB — URINE MICROSCOPIC-ADD ON

## 2015-09-13 LAB — BRAIN NATRIURETIC PEPTIDE: B Natriuretic Peptide: 41.8 pg/mL (ref 0.0–100.0)

## 2015-09-13 LAB — POC OCCULT BLOOD, ED: Fecal Occult Bld: NEGATIVE

## 2015-09-13 MED ORDER — ACETAMINOPHEN 650 MG RE SUPP: 650.0000 mg | Freq: Four times a day (QID) | RECTAL | Status: DC | PRN

## 2015-09-13 MED ORDER — IOHEXOL 300 MG/ML  SOLN
100.0000 mL | Freq: Once | INTRAMUSCULAR | Status: AC | PRN
  Administered 2015-09-13: 100 mL via INTRAVENOUS

## 2015-09-13 MED ORDER — PANTOPRAZOLE SODIUM 40 MG PO TBEC
40.0000 mg | DELAYED_RELEASE_TABLET | Freq: Every day | ORAL | Status: DC
  Administered 2015-09-14 – 2015-09-15 (×3): 40 mg via ORAL
  Filled 2015-09-13 (×2): qty 1

## 2015-09-13 MED ORDER — RIVAROXABAN 20 MG PO TABS: 20.0000 mg | ORAL_TABLET | Freq: Every day | ORAL | Status: DC

## 2015-09-13 MED ORDER — OXYCODONE-ACETAMINOPHEN 10-325 MG PO TABS: 1.0000 | ORAL_TABLET | Freq: Four times a day (QID) | ORAL | Status: DC | PRN

## 2015-09-13 MED ORDER — ALPRAZOLAM 0.5 MG PO TABS
1.0000 mg | ORAL_TABLET | Freq: Three times a day (TID) | ORAL | Status: DC | PRN
  Filled 2015-09-13: qty 2

## 2015-09-13 MED ORDER — METHOCARBAMOL 500 MG PO TABS: 500.0000 mg | ORAL_TABLET | Freq: Three times a day (TID) | ORAL | Status: DC | PRN

## 2015-09-13 MED ORDER — HEPARIN (PORCINE) IN NACL 100-0.45 UNIT/ML-% IJ SOLN
1800.0000 [IU]/h | INTRAMUSCULAR | Status: DC
  Administered 2015-09-14: 1800 [IU]/h via INTRAVENOUS
  Filled 2015-09-13: qty 250

## 2015-09-13 MED ORDER — HYDROMORPHONE HCL 1 MG/ML IJ SOLN: 1.0000 mg | INTRAMUSCULAR | Status: DC | PRN

## 2015-09-13 MED ORDER — OXYCODONE HCL 5 MG PO TABS: 5.0000 mg | ORAL_TABLET | Freq: Four times a day (QID) | ORAL | Status: DC | PRN

## 2015-09-13 MED ORDER — ONDANSETRON HCL 4 MG/2ML IJ SOLN: 4.0000 mg | Freq: Four times a day (QID) | INTRAMUSCULAR | Status: DC | PRN

## 2015-09-13 MED ORDER — SODIUM CHLORIDE 0.9 % IV BOLUS (SEPSIS)
1000.0000 mL | Freq: Once | INTRAVENOUS | Status: AC
  Administered 2015-09-13: 1000 mL via INTRAVENOUS

## 2015-09-13 MED ORDER — ADULT MULTIVITAMIN W/MINERALS CH
1.0000 | ORAL_TABLET | Freq: Every morning | ORAL | Status: DC
  Administered 2015-09-14 – 2015-09-15 (×2): 1 via ORAL
  Filled 2015-09-13 (×2): qty 1

## 2015-09-13 MED ORDER — DIPHENHYDRAMINE HCL 25 MG PO CAPS
50.0000 mg | ORAL_CAPSULE | Freq: Once | ORAL | Status: AC
  Administered 2015-09-13: 50 mg via ORAL
  Filled 2015-09-13: qty 2

## 2015-09-13 MED ORDER — TRAMADOL HCL 50 MG PO TABS: 100.0000 mg | ORAL_TABLET | Freq: Four times a day (QID) | ORAL | Status: DC | PRN

## 2015-09-13 MED ORDER — ALBUTEROL SULFATE (2.5 MG/3ML) 0.083% IN NEBU: 5.0000 mg | INHALATION_SOLUTION | RESPIRATORY_TRACT | Status: DC | PRN

## 2015-09-13 MED ORDER — SODIUM CHLORIDE 0.9 % IV SOLN
INTRAVENOUS | Status: DC
  Administered 2015-09-13: 20:00:00 via INTRAVENOUS

## 2015-09-13 MED ORDER — FOLIC ACID 1 MG PO TABS
1.0000 mg | ORAL_TABLET | Freq: Every day | ORAL | Status: DC
  Administered 2015-09-14 – 2015-09-15 (×2): 1 mg via ORAL
  Filled 2015-09-13 (×2): qty 1

## 2015-09-13 MED ORDER — IOHEXOL 300 MG/ML  SOLN: 25.0000 mL | Freq: Once | INTRAMUSCULAR | Status: DC | PRN

## 2015-09-13 MED ORDER — POLYETHYLENE GLYCOL 3350 17 G PO PACK: 17.0000 g | PACK | Freq: Every day | ORAL | Status: DC | PRN

## 2015-09-13 MED ORDER — ACETAMINOPHEN 325 MG PO TABS
650.0000 mg | ORAL_TABLET | Freq: Four times a day (QID) | ORAL | Status: DC | PRN
  Administered 2015-09-13 – 2015-09-14 (×3): 650 mg via ORAL
  Filled 2015-09-13 (×3): qty 2

## 2015-09-13 MED ORDER — DOCUSATE SODIUM 100 MG PO CAPS
100.0000 mg | ORAL_CAPSULE | Freq: Two times a day (BID) | ORAL | Status: DC
  Administered 2015-09-14: 100 mg via ORAL
  Filled 2015-09-13 (×3): qty 1

## 2015-09-13 MED ORDER — LORAZEPAM 2 MG/ML IJ SOLN
1.0000 mg | Freq: Once | INTRAMUSCULAR | Status: DC
  Filled 2015-09-13: qty 1

## 2015-09-13 MED ORDER — ONDANSETRON HCL 4 MG PO TABS: 4.0000 mg | ORAL_TABLET | Freq: Four times a day (QID) | ORAL | Status: DC | PRN

## 2015-09-13 MED ORDER — OXYCODONE-ACETAMINOPHEN 5-325 MG PO TABS: 1.0000 | ORAL_TABLET | Freq: Four times a day (QID) | ORAL | Status: DC | PRN

## 2015-09-13 MED ORDER — SODIUM CHLORIDE 0.9 % IJ SOLN
3.0000 mL | Freq: Two times a day (BID) | INTRAMUSCULAR | Status: DC
  Administered 2015-09-13 – 2015-09-14 (×3): 3 mL via INTRAVENOUS

## 2015-09-13 MED ORDER — ONDANSETRON HCL 4 MG/2ML IJ SOLN: 4.0000 mg | Freq: Three times a day (TID) | INTRAMUSCULAR | Status: DC | PRN

## 2015-09-13 NOTE — ED Notes (Signed)
Ambulated patient in hallway with pulse oximetry attached. Patient oxygen levels have been fluctuating in the high 80's to low 90's without any exertion. However while ambulating patient, noted increased shortness of breath and patient states he feels very lightheaded and dizzy. Oxygen levels at this time mid 90's and pulse rate is between 118-120.

## 2015-09-13 NOTE — ED Provider Notes (Signed)
CSN: [id number]     Arrival date & time 09/13/15  1152 History   First MD Initiated Contact with Patient 09/13/15 1239     No chief complaint on file.    (Consider location/radiation/quality/duration/timing/severity/associated sxs/prior Treatment) HPI   Pt with hx GERD, PE, morbid obesity on xarelto p/w 9 days fevers, chills/sweats, fatigue, SOB, abdominal pain, nausea, vomiting only once 5 days ago.  Has had soft stools. Abdominal pain is throughout abdomen, worse 10-15 minutes after eating and with lying on his abdomen.  Fever to 102.4 at home.  Course is worsening.  Has been to PCP with diagnosis of viral illness, has noted his Hgb dropping from 15 last week to 13 this morning at urgent care where he was directed to the Emergency Department.  Has been watching his stool and urine closely and notes no blood.   Denies cough, nasal congestion, CP, diarrhea, skin changes/rash, urinary symptoms.    Past Medical History  Diagnosis Date  . OSA (obstructive sleep apnea)     dr Shelle Iron  . Testosterone deficiency     dr Patsi Sears, shots every 2 weeks  . ED (erectile dysfunction)   . GERD (gastroesophageal reflux disease)     [facility]  . Low back pain     dr Channing Mutters, dr Farris Has, dr Ethelene Hal, herniated disc L4-5  . PE (pulmonary embolism)     bilateral sep 2011 and again bilateral May 2012  . 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 Primary hypercoagulable state     no etiology found per Dr. Shirline Frees   . Hx of colonoscopy   . Pulmonary embolism 02/22/2010    CT angio Dx  . Chest pain    Past Surgical History  Procedure Laterality Date  . Knee surgery      both  . Shoulder surgery      right and left  . Esi      L4-5 dr Channing Mutters 03/2010  . Rotator cuff repair  08-20-10    left dr Wyline Mood complicated by PE  . Esophagogastroduodenoscopy      x2 - normal except reflux  . Laparoscopic appendectomy  09/13/2012    Procedure: APPENDECTOMY LAPAROSCOPIC;  Surgeon: Almond Lint, MD;  Location: [facility];   Service: General;  Laterality: N/A;  . Cardiac stress test x 2      Family History  Problem Relation Age of Onset  . Hypertension Other   . Cancer Other     lung  . Coronary artery disease Neg Hx    Social History  Substance Use Topics  . Smoking status: Never Smoker   . Smokeless tobacco: Never Used     Comment: never used product  . Alcohol Use: No    Review of Systems  All other systems reviewed and are negative.     Allergies  Review of patient's allergies indicates no known allergies.  Home Medications   Prior to Admission medications   Medication Sig Start Date End Date Taking? Authorizing Provider  ALPRAZolam (XANAX) 1 MG tablet TAKE 1 TABLET BY MOUTH 3 TIMES A DAY AS NEEDED Patient taking differently: TAKE 1 TABLET BY MOUTH 3 TIMES A DAY AS NEEDED for anxiety 07/03/15   Nelwyn Salisbury, MD  diphenhydramine-acetaminophen (TYLENOL PM) 25-500 MG TABS Take 2 tablets by mouth at bedtime.     Historical Provider, MD  docusate sodium (COLACE) 100 MG capsule Take 1 capsule (100 mg total) by mouth 2 (two) times daily.  02/25/14   Nelwyn Salisbury, MD  folic acid (FOLVITE) 1 MG tablet TAKE 1 TABLET BY MOUTH EVERY DAY 08/23/15   Josph Macho, MD  furosemide (LASIX) 40 MG tablet Take 1 tablet (40 mg total) by mouth as needed. Patient taking differently: Take 40 mg by mouth as needed for fluid.  05/01/15   Nelwyn Salisbury, MD  methocarbamol (ROBAXIN) 500 MG tablet Take 500 mg by mouth every 8 (eight) hours as needed. for pain 06/05/15   Historical Provider, MD  Multiple Vitamin (MULTI-VITAMIN DAILY PO) Take by mouth every morning.    Historical Provider, MD  omeprazole (PRILOSEC) 40 MG capsule TAKE 1 CAPSULE (40 MG TOTAL) BY MOUTH DAILY. 05/25/15   Nelwyn Salisbury, MD  OVER THE COUNTER MEDICATION Take 1 tablet by mouth daily.    Historical Provider, MD  oxyCODONE-acetaminophen (PERCOCET) 10-325 MG per tablet Take 1 tablet by mouth 4 (four) times daily as needed for pain.  07/09/15   Historical  Provider, MD  polyethylene glycol (MIRALAX / GLYCOLAX) packet Take 17 g by mouth daily as needed. 09/18/12   Sherrie George, PA-C  predniSONE (DELTASONE) 10 MG tablet Take 1 tablet (10 mg total) by mouth daily with breakfast. Patient not taking: Reported on 09/07/2015 08/18/15   Nelwyn Salisbury, MD  traMADol (ULTRAM) 50 MG tablet Take 2 tablets (100 mg total) by mouth every 6 (six) hours as needed. Patient taking differently: Take 100 mg by mouth every 6 (six) hours as needed for moderate pain.  05/01/15   Nelwyn Salisbury, MD  XARELTO 20 MG TABS tablet TAKE 1 TABLET BY MOUTH EVERY DAY 12/18/14   Nelwyn Salisbury, MD   BP 134/81 mmHg  Pulse 102  Temp(Src) 98.4 F (36.9 C) (Oral)  Resp 16  Ht  (1.778 m)  Wt 371 lb 3 oz (168.37 kg)  BMI 53.26 kg/m2  SpO2 93% Physical Exam  Constitutional: He appears well-developed and well-nourished. No distress.  HENT:  Head: Normocephalic and atraumatic.  Neck: Neck supple.  Cardiovascular: Normal rate and regular rhythm.   Pulmonary/Chest: Effort normal and breath sounds normal. No respiratory distress. He has no wheezes. He has no rales.  Abdominal: Soft. He exhibits no distension. There is tenderness in the left lower quadrant. There is no rebound and no guarding.  Morbidly obese  Genitourinary: Rectal exam shows external hemorrhoid. Rectal exam shows no mass, no tenderness and anal tone normal.  Musculoskeletal: He exhibits no edema.  Neurological: He is alert. He exhibits normal muscle tone.  Skin: He is not diaphoretic.  Pinpoint erythematous rash vs venous stasis skin changes bilateral distal shins  Nursing note and vitals reviewed.   ED Course  Procedures (including critical care time) Labs Review Labs Reviewed  COMPREHENSIVE METABOLIC PANEL - Abnormal; Notable for the following:    Sodium 133 (*)    Glucose, Bld 157 (*)    Calcium 8.5 (*)    Albumin 2.8 (*)    All other components within normal limits  CBC WITH DIFFERENTIAL/PLATELET -  Abnormal; Notable for the following:    RBC 4.08 (*)    Hemoglobin 12.9 (*)    HCT 38.6 (*)    All other components within normal limits  LIPASE, BLOOD  TROPONIN I  BRAIN NATRIURETIC PEPTIDE  URINALYSIS, ROUTINE W REFLEX MICROSCOPIC (NOT AT [facility])  POC OCCULT BLOOD, ED    Imaging Review Dg Chest 2 View  09/13/2015   CLINICAL DATA:  Flu-like symptoms for 9  days. Nausea, vomiting, dizziness, fever.  EXAM: CHEST  2 VIEW  COMPARISON:  08/18/2015  FINDINGS: Cardiomegaly with vascular congestion. Diffuse interstitial prominence throughout the lungs without confluent opacity or effusion. No acute bony abnormality.  IMPRESSION: Cardiomegaly with diffuse interstitial prominence and peribronchial thickening. Findings could reflect bronchitis, less likely edema.   Electronically Signed   By: Charlett Nose M.D.   On: 09/13/2015 14:10   Ct Abdomen Pelvis W Contrast  09/13/2015   CLINICAL DATA:  Left lower quadrant abdominal pain. Fever, shortness of breath.  EXAM: CT ABDOMEN AND PELVIS WITH CONTRAST  TECHNIQUE: Multidetector CT imaging of the abdomen and pelvis was performed using the standard protocol following bolus administration of intravenous contrast.  CONTRAST:  OMNIPAQUE IOHEXOL 300 MG/ML  SOLN  COMPARISON:  09/29/2012  FINDINGS: Lung bases are clear.  No effusions.  Heart is normal size.  Liver, gallbladder, spleen, pancreas, adrenals and kidneys are normal. Patient is status post appendectomy. Stomach, large and small bowel unremarkable. No free fluid, free air or adenopathy. No diverticulosis or diverticulitis. Urinary bladder is unremarkable. Aorta is normal caliber.  No acute bony abnormality or focal bone lesion.  IMPRESSION: No acute findings in the abdomen or pelvis.   Electronically Signed   By: Charlett Nose M.D.   On: 09/13/2015 14:34   I have personally reviewed and evaluated these images and lab results as part of my medical decision-making.   EKG Interpretation   Date/Time:   Sunday September 13 2015 12:45:53 EDT Ventricular Rate:  105 PR Interval:  135 QRS Duration: 91 QT Interval:  318 QTC Calculation: 420 R Axis:   -61 Text Interpretation:  Sinus tachycardia Left anterior fascicular block  Abnormal R-wave progression, late transition no acute ST/T changes no  significant change since Aug 2016 Confirmed by Criss Alvine  MD, SCOTT ([id number])  on 09/13/2015 1:51:47 PM       3:55 PM Discussed results with patient and his parents (with his permission).  Sitting in chair his O2 is 88% with good wave form.  Blood pressure intermittently in the 90s systolic.  Discussed pt with Dr Littie Deeds who will also see the patient.  Recommends admission for new hypoxia.    MDM   Final diagnoses:  Hypoxia  Other fatigue  Left lower quadrant pain  Anemia, unspecified anemia type    Patient with 9 days of worsening fever, fatigue, SOB, abdominal pain, nausea.  Hypoxic to 88% sitting in a chair.  CXR is abnormal with edema vs bronchitis; however, pt is not coughing.  BNP is negative.  CT abd/pelvis shows no acute process.  Labs remarkable for drop in Hgb from 15 to 12.9.  Hemoccult negative.  He is on xarelto for PE.  Plan is for CT angio chest, suspect PNA vs PE.  UA also pending. Admitted to [facility] for overnight obs, continued evaluation and treatment for hypoxia.  Dr Catha Gosselin accepts admission.        Trixie Dredge, PA-C 09/13/15 1625  Mirian Mo, MD 09/19/15 [phone]

## 2015-09-13 NOTE — H&P (Addendum)
[facility] History and Physical  Everitt Wenner ZOX:[id number] DOB: 12-08-1956 DOA: 09/13/2015  Referring physician: Ms. Trixie Dredge, PA EDP PCP: Nelwyn Salisbury, MD  Specialists:   Chief Complaint: Fever, fatigue, Abdominal pain  HPI: Gregory Cortez is a 59 y.o. male  With a history of pulmonary embolism on Xarelto, who presented with complaints of fever chills, fatigue, abdominal pain for approximately 9 days. Patient had seen his primary care physician approximately one week ago and was given prednisone however reacted negatively. Patient went to see urgent care today and was struck to see emergency department. He did have a negative influenza. Patient was thought to have possible diverticulitis or upper respiratory infection, patient was directed to emergency department. CT of the abdomen was negative. Chest x-ray was obtained, check questionable bronchitis. Of note patient stated his hemoglobin was approximately 15 one week ago, currently 12.9. Patient denied any cough, congestion, headache, dizziness, chest pain, urinary symptoms change in bowel habits, recent travel or ill contacts. Patient does admit to being under stress at work. Upon ambulation in the emergency department, patient was found to be hypoxic, oxygen saturations of 87%.  [facility] called for admission.  Review of Systems:  Constitutional: Complained of fever, fatigue.  HEENT: Denies photophobia, eye pain, redness, hearing loss, ear pain, congestion, sore throat, rhinorrhea, sneezing, mouth sores, trouble swallowing, neck pain, neck stiffness and tinnitus.   Respiratory: Complained of some shortness of breath. Cardiovascular: Denies chest pain, palpitations. Has leg swelling occasionally. Gastrointestinal: Denies nausea, vomiting, abdominal pain, diarrhea, constipation, blood in stool and abdominal distention.  Genitourinary: Denies dysuria, urgency, frequency, hematuria, flank pain and difficulty urinating.  Musculoskeletal:  Complains of back and knee pain. Skin: Denies pallor, rash and wound.  Neurological: Denies dizziness, seizures, syncope, weakness, light-headedness, numbness and headaches.  Hematological: Denies adenopathy. Easy bruising, personal or family bleeding history  Psychiatric/Behavioral: Complains of increased stress at work.  Past Medical History  Diagnosis Date  . OSA (obstructive sleep apnea)     dr Shelle Iron  . Testosterone deficiency     dr Patsi Sears, shots every 2 weeks  . ED (erectile dysfunction)   . GERD (gastroesophageal reflux disease)     [facility]  . Low back pain     dr Channing Mutters, dr Farris Has, dr Ethelene Hal, herniated disc L4-5  . PE (pulmonary embolism)     bilateral sep 2011 and again bilateral May 2012  . 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 Primary hypercoagulable state     no etiology found per Dr. Shirline Frees   . Hx of colonoscopy   . Pulmonary embolism 02/22/2010    CT angio Dx  . Chest pain    Past Surgical History  Procedure Laterality Date  . Knee surgery      both  . Shoulder surgery      right and left  . Esi      L4-5 dr Channing Mutters 03/2010  . Rotator cuff repair  08-20-10    left dr Wyline Mood complicated by PE  . Esophagogastroduodenoscopy      x2 - normal except reflux  . Laparoscopic appendectomy  09/13/2012    Procedure: APPENDECTOMY LAPAROSCOPIC;  Surgeon: Almond Lint, MD;  Location: [facility];  Service: General;  Laterality: N/A;  . Cardiac stress test x 2     . Appendectomy     Social History:  reports that he has never smoked. He has never used smokeless tobacco. He reports that he does not drink alcohol or  use illicit drugs.   No Known Allergies  Family History  Problem Relation Age of Onset  . Hypertension Other   . Cancer Other     lung  . Coronary artery disease Neg Hx    Prior to Admission medications   Medication Sig Start Date End Date Taking? Authorizing Provider  ALPRAZolam (XANAX) 1 MG tablet TAKE 1 TABLET BY MOUTH 3 TIMES A DAY AS NEEDED Patient  taking differently: TAKE 1 TABLET BY MOUTH 3 TIMES A DAY AS NEEDED for anxiety 07/03/15   Nelwyn Salisbury, MD  diphenhydramine-acetaminophen (TYLENOL PM) 25-500 MG TABS Take 2 tablets by mouth at bedtime.     Historical Provider, MD  docusate sodium (COLACE) 100 MG capsule Take 1 capsule (100 mg total) by mouth 2 (two) times daily. 02/25/14   Nelwyn Salisbury, MD  folic acid (FOLVITE) 1 MG tablet TAKE 1 TABLET BY MOUTH EVERY DAY 08/23/15   Josph Macho, MD  furosemide (LASIX) 40 MG tablet Take 1 tablet (40 mg total) by mouth as needed. Patient taking differently: Take 40 mg by mouth as needed for fluid.  05/01/15   Nelwyn Salisbury, MD  methocarbamol (ROBAXIN) 500 MG tablet Take 500 mg by mouth every 8 (eight) hours as needed. for pain 06/05/15   Historical Provider, MD  Multiple Vitamin (MULTI-VITAMIN DAILY PO) Take by mouth every morning.    Historical Provider, MD  omeprazole (PRILOSEC) 40 MG capsule TAKE 1 CAPSULE (40 MG TOTAL) BY MOUTH DAILY. 05/25/15   Nelwyn Salisbury, MD  OVER THE COUNTER MEDICATION Take 1 tablet by mouth daily.    Historical Provider, MD  oxyCODONE-acetaminophen (PERCOCET) 10-325 MG per tablet Take 1 tablet by mouth 4 (four) times daily as needed for pain.  07/09/15   Historical Provider, MD  polyethylene glycol (MIRALAX / GLYCOLAX) packet Take 17 g by mouth daily as needed. 09/18/12   Sherrie George, PA-C  traMADol (ULTRAM) 50 MG tablet Take 2 tablets (100 mg total) by mouth every 6 (six) hours as needed. Patient taking differently: Take 100 mg by mouth every 6 (six) hours as needed for moderate pain.  05/01/15   Nelwyn Salisbury, MD  XARELTO 20 MG TABS tablet TAKE 1 TABLET BY MOUTH EVERY DAY 12/18/14   Nelwyn Salisbury, MD   Physical Exam: Filed Vitals:   09/13/15 1549  BP: 125/62  Pulse: 95  Temp:   Resp: 23     General: Well developed, well nourished, NAD, appears stated age  HEENT: NCAT, PERRLA, EOMI, Anicteic Sclera, mucous membranes moist.   Neck: Supple, no JVD, no  masses  Cardiovascular: S1 S2 auscultated, no rubs, murmurs or gallops. Regular rate and rhythm.  Respiratory: Clear to auscultation bilaterally with equal chest rise  Abdomen: Soft, obese, nontender, nondistended, + bowel sounds  Extremities: warm dry without cyanosis clubbing. Trace LE edema B/L  Neuro: AAOx3, cranial nerves grossly intact. Strength 5/5 in patient's upper and lower extremities bilaterally  Skin: Without rashes exudates or nodules  Psych: Normal affect and demeanor with intact judgement and insight  Labs on Admission:  Basic Metabolic Panel:  Recent Labs Lab 09/07/15 1024 09/13/15 1210  NA 140 133*  K 4.3 3.7  CL 104 102  CO2 30 24  GLUCOSE 126* 157*  BUN 9 8  CREATININE 0.76 0.94  CALCIUM 8.7 8.5*   Liver Function Tests:  Recent Labs Lab 09/07/15 1024 09/13/15 1210  AST 30 41  ALT 60* 46  ALKPHOS 77 65  BILITOT 0.8 0.9  PROT 6.6 6.6  ALBUMIN 3.7 2.8*    Recent Labs Lab 09/13/15 1210  LIPASE 35   No results for input(s): AMMONIA in the last 168 hours. CBC:  Recent Labs Lab 09/07/15 1024 09/13/15 1210  WBC 4.0 6.6  NEUTROABS 3.0 3.5  HGB 15.3 12.9*  HCT 45.3 38.6*  MCV 94.9 94.6  PLT 158.0 260   Cardiac Enzymes:  Recent Labs Lab 09/13/15 1451  TROPONINI <0.03    BNP (last 3 results)  Recent Labs  02/20/15 2151 09/13/15 1451  BNP 14.5 41.8    ProBNP (last 3 results) No results for input(s): PROBNP in the last 8760 hours.  CBG: No results for input(s): GLUCAP in the last 168 hours.  Radiological Exams on Admission: Dg Chest 2 View  09/13/2015   CLINICAL DATA:  Flu-like symptoms for 9 days. Nausea, vomiting, dizziness, fever.  EXAM: CHEST  2 VIEW  COMPARISON:  08/18/2015  FINDINGS: Cardiomegaly with vascular congestion. Diffuse interstitial prominence throughout the lungs without confluent opacity or effusion. No acute bony abnormality.  IMPRESSION: Cardiomegaly with diffuse interstitial prominence and  peribronchial thickening. Findings could reflect bronchitis, less likely edema.   Electronically Signed   By: Charlett Nose M.D.   On: 09/13/2015 14:10   Ct Abdomen Pelvis W Contrast  09/13/2015   CLINICAL DATA:  Left lower quadrant abdominal pain. Fever, shortness of breath.  EXAM: CT ABDOMEN AND PELVIS WITH CONTRAST  TECHNIQUE: Multidetector CT imaging of the abdomen and pelvis was performed using the standard protocol following bolus administration of intravenous contrast.  CONTRAST:  OMNIPAQUE IOHEXOL 300 MG/ML  SOLN  COMPARISON:  09/29/2012  FINDINGS: Lung bases are clear.  No effusions.  Heart is normal size.  Liver, gallbladder, spleen, pancreas, adrenals and kidneys are normal. Patient is status post appendectomy. Stomach, large and small bowel unremarkable. No free fluid, free air or adenopathy. No diverticulosis or diverticulitis. Urinary bladder is unremarkable. Aorta is normal caliber.  No acute bony abnormality or focal bone lesion.  IMPRESSION: No acute findings in the abdomen or pelvis.   Electronically Signed   By: Charlett Nose M.D.   On: 09/13/2015 14:34    EKG: Independently reviewed. Sinus tachycardia rate 105, left intratesticular block  Assessment/Plan Acute hypoxia -Patient will be admitted to tele for observation -Will place patient on continuous pulse ox -Possibly secondary to bronchitis, URI, ?pnuemonia, ?PE, obesity (?Pickwinian syndrome) -Chest x-ray showed cardiomegaly with diffuse interstitial prominence and peribronchial thickening, could reflect bronchitis -Pending CTA chest -Continue xarelto, supplemental O2 to maintain sats >92% -Currently not wheezing -Unlikely secondary to CHF, BNP 41, troponin 0.03 (echocardiogram 07/23/2015 showed an EF of 55-60%)  Obstructive Sleep Apnea -CPAP QHS  Abdominal pain -CT abdomen pelvis with contrast showed no acute findings -Continue conservative management, currently has no more abdominal pain. Denies any nausea or  vomiting. -Lipase 35, LFTs normal  Normocytic anemia -Supposed the patient's hemoglobin was probably 15 1 week ago. However after reviewing chart, baseline hemoglobin appears to be 14.  Currently 12.9 -Will obtain anemia panel -FOBT negative -Continue to monitor CBC  Fatigue -Will obtain TSH -has been ongoing for one month, doubt this is due to anemia  History of pulmonary embolism, thrombosis of the arm -Continue Xarelto  Low back pain/knee pain -Continue home medications, tramadol and oxycodone, Robaxin  Anxiety -Continue Xanax as needed  GERD -Continue PPI  Morbid obesity -Nutrition consulted. Patient did discuss lifestyle modifications including diet and exercise with his primary care  physician upon discharge.  DVT prophylaxis: Xarelto  Code Status: Full  Condition: Guarded  Family Communication: Parents at bedside. Admission, patients condition and plan of care including tests being ordered have been discussed with the patient and Parents who indicate understanding and agree with the plan and Code Status.  Disposition Plan: Admitted for observation   Time spent: 60 minutes  Willamina Grieshop D.O. [facility] Pager [phone]  If 7PM-7AM, please contact night-coverage [email or web address] Password [facility] 09/13/2015, 4:42 PM

## 2015-09-13 NOTE — ED Notes (Signed)
Pt sent here from [facility] u/c for onset 09-04-15 headache, light headedness, cramping LE, now having abd pain "all over" started, fever, shortness of breath.  No c/o chest pain.  Pt talking in complete sentences and seems winded when talking at lengths.

## 2015-09-13 NOTE — Progress Notes (Signed)
ANTICOAGULATION CONSULT NOTE - Initial Consult  Pharmacy Consult for heparin Indication: VTE treatment  Allergies  Allergen Reactions  . Prednisone Other (See Comments)    agitation    Patient Measurements: Height:  (177.8 cm) Weight: (!) 371 lb 4.8 oz (168.421 kg) IBW/kg (Calculated) : 73 Heparin Dosing Weight: 114kg  Vital Signs: Temp: 98.9 F (37.2 C) (09/25 2001) Temp Source: Oral (09/25 2001) BP: 105/56 mmHg (09/25 2001) Pulse Rate: 81 (09/25 2001)  Labs:  Recent Labs  09/13/15 1210 09/13/15 1451  HGB 12.9*  --   HCT 38.6*  --   PLT 260  --   CREATININE 0.94  --   TROPONINI  --  <0.03    Estimated Creatinine Clearance: 138 mL/min (by C-G formula based on Cr of 0.94).   Medical History: Past Medical History  Diagnosis Date  . OSA (obstructive sleep apnea)     dr Shelle Iron  . Testosterone deficiency     dr Patsi Sears, shots every 2 weeks  . ED (erectile dysfunction)   . GERD (gastroesophageal reflux disease)     [facility]  . Low back pain     dr Channing Mutters, dr Farris Has, dr Ethelene Hal, herniated disc L4-5  . PE (pulmonary embolism)     bilateral sep 2011 and again bilateral May 2012  . 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 Primary hypercoagulable state     no etiology found per Dr. Shirline Frees   . Hx of colonoscopy   . Pulmonary embolism 02/22/2010    CT angio Dx  . Chest pain      Assessment: 59 y.o. male with a history of pulmonary embolism on Xarelto, who presented with complaints of fever chills, fatigue, abdominal pain for approximately 9 days. Patient was on xarelto pta with last dose taken this am with breakfast. Plan is to transition to IV heparin while hypoxia workup is ongoing.  Note that patient states his hgb was 15 not too long ago and is currently 12.9, will continue to follow.  Goal of Therapy:  Heparin level 0.3-0.7 units/ml aPTT 66-102 seconds Monitor platelets by anticoagulation protocol: Yes   Plan:  Start heparin infusion at 1800  units/hr Check anti-Xa level in 6 hours and daily while on heparin Continue to monitor H&H and platelets  Sheppard Coil PharmD., BCPS Clinical Pharmacist Pager [phone] 09/13/2015 10:00 PM

## 2015-09-13 NOTE — ED Notes (Signed)
Pt reports that he is very anxious and CT and MRI make him claustophobic.  Requesting med for same.  PA advised.

## 2015-09-13 NOTE — ED Notes (Signed)
Pt tolerated CT without Ativan.  Ativan returned to pyxis.

## 2015-09-14 ENCOUNTER — Inpatient Hospital Stay (HOSPITAL_COMMUNITY): Payer: 59

## 2015-09-14 ENCOUNTER — Encounter: Payer: BLUE CROSS/BLUE SHIELD | Admitting: Family Medicine

## 2015-09-14 DIAGNOSIS — J189 Pneumonia, unspecified organism: Secondary | ICD-10-CM | POA: Diagnosis present

## 2015-09-14 DIAGNOSIS — R1032 Left lower quadrant pain: Secondary | ICD-10-CM | POA: Diagnosis present

## 2015-09-14 DIAGNOSIS — J9601 Acute respiratory failure with hypoxia: Secondary | ICD-10-CM | POA: Diagnosis present

## 2015-09-14 DIAGNOSIS — M5126 Other intervertebral disc displacement, lumbar region: Secondary | ICD-10-CM | POA: Diagnosis present

## 2015-09-14 DIAGNOSIS — G4730 Sleep apnea, unspecified: Secondary | ICD-10-CM | POA: Diagnosis not present

## 2015-09-14 DIAGNOSIS — F419 Anxiety disorder, unspecified: Secondary | ICD-10-CM | POA: Diagnosis present

## 2015-09-14 DIAGNOSIS — Z6841 Body Mass Index (BMI) 40.0 and over, adult: Secondary | ICD-10-CM | POA: Diagnosis not present

## 2015-09-14 DIAGNOSIS — Z79899 Other long term (current) drug therapy: Secondary | ICD-10-CM | POA: Diagnosis not present

## 2015-09-14 DIAGNOSIS — Z7901 Long term (current) use of anticoagulants: Secondary | ICD-10-CM | POA: Diagnosis not present

## 2015-09-14 DIAGNOSIS — D509 Iron deficiency anemia, unspecified: Secondary | ICD-10-CM | POA: Diagnosis present

## 2015-09-14 DIAGNOSIS — M25569 Pain in unspecified knee: Secondary | ICD-10-CM | POA: Diagnosis present

## 2015-09-14 DIAGNOSIS — R0902 Hypoxemia: Secondary | ICD-10-CM | POA: Diagnosis present

## 2015-09-14 DIAGNOSIS — Z86711 Personal history of pulmonary embolism: Secondary | ICD-10-CM | POA: Diagnosis not present

## 2015-09-14 DIAGNOSIS — Z888 Allergy status to other drugs, medicaments and biological substances status: Secondary | ICD-10-CM | POA: Diagnosis not present

## 2015-09-14 DIAGNOSIS — J40 Bronchitis, not specified as acute or chronic: Secondary | ICD-10-CM | POA: Diagnosis present

## 2015-09-14 DIAGNOSIS — J209 Acute bronchitis, unspecified: Secondary | ICD-10-CM | POA: Diagnosis not present

## 2015-09-14 DIAGNOSIS — K219 Gastro-esophageal reflux disease without esophagitis: Secondary | ICD-10-CM | POA: Diagnosis present

## 2015-09-14 DIAGNOSIS — Z86718 Personal history of other venous thrombosis and embolism: Secondary | ICD-10-CM | POA: Diagnosis not present

## 2015-09-14 DIAGNOSIS — E291 Testicular hypofunction: Secondary | ICD-10-CM | POA: Diagnosis present

## 2015-09-14 DIAGNOSIS — E662 Morbid (severe) obesity with alveolar hypoventilation: Secondary | ICD-10-CM | POA: Diagnosis present

## 2015-09-14 LAB — CBC
HCT: 37.5 % — ABNORMAL LOW (ref 39.0–52.0)
Hemoglobin: 12.3 g/dL — ABNORMAL LOW (ref 13.0–17.0)
MCH: 31.5 pg (ref 26.0–34.0)
MCHC: 32.8 g/dL (ref 30.0–36.0)
MCV: 95.9 fL (ref 78.0–100.0)
Platelets: 259 10*3/uL (ref 150–400)
RBC: 3.91 MIL/uL — ABNORMAL LOW (ref 4.22–5.81)
RDW: 13.5 % (ref 11.5–15.5)
WBC: 6.3 10*3/uL (ref 4.0–10.5)

## 2015-09-14 LAB — HEPARIN LEVEL (UNFRACTIONATED)
Heparin Unfractionated: 0.93 IU/mL — ABNORMAL HIGH (ref 0.30–0.70)
Heparin Unfractionated: 0.49 IU/mL (ref 0.30–0.70)
Heparin Unfractionated: 0.38 IU/mL (ref 0.30–0.70)

## 2015-09-14 LAB — BASIC METABOLIC PANEL
Anion gap: 5 (ref 5–15)
BUN: 6 mg/dL (ref 6–20)
CO2: 29 mmol/L (ref 22–32)
Calcium: 8.8 mg/dL — ABNORMAL LOW (ref 8.9–10.3)
Chloride: 105 mmol/L (ref 101–111)
Creatinine, Ser: 0.78 mg/dL (ref 0.61–1.24)
GFR calc Af Amer: >60 mL/min (ref >60)
GFR calc non Af Amer: >60 mL/min (ref >60)
Glucose, Bld: 132 mg/dL — ABNORMAL HIGH (ref 65–99)
Potassium: 4.2 mmol/L (ref 3.5–5.1)
Sodium: 139 mmol/L (ref 135–145)

## 2015-09-14 LAB — APTT
aPTT: 47 seconds — ABNORMAL HIGH (ref 24–37)
aPTT: 64 seconds — ABNORMAL HIGH (ref 24–37)

## 2015-09-14 MED ORDER — METOCLOPRAMIDE HCL 5 MG/ML IJ SOLN: 10.0000 mg | Freq: Once | INTRAMUSCULAR | Status: DC

## 2015-09-14 MED ORDER — TECHNETIUM TO 99M ALBUMIN AGGREGATED
6.5000 | Freq: Once | INTRAVENOUS | Status: AC | PRN
  Administered 2015-09-14: 7 via INTRAVENOUS

## 2015-09-14 MED ORDER — AMOXICILLIN-POT CLAVULANATE 875-125 MG PO TABS
1.0000 | ORAL_TABLET | Freq: Two times a day (BID) | ORAL | Status: DC
  Administered 2015-09-14 – 2015-09-15 (×3): 1 via ORAL
  Filled 2015-09-14 (×3): qty 1

## 2015-09-14 MED ORDER — TECHNETIUM TC 99M DIETHYLENETRIAME-PENTAACETIC ACID: 42.9000 | Freq: Once | INTRAVENOUS | Status: DC | PRN

## 2015-09-14 MED ORDER — KETOROLAC TROMETHAMINE 30 MG/ML IJ SOLN: 30.0000 mg | Freq: Once | INTRAMUSCULAR | Status: DC

## 2015-09-14 MED ORDER — RISAQUAD PO CAPS
1.0000 | ORAL_CAPSULE | Freq: Every day | ORAL | Status: DC
  Administered 2015-09-14 – 2015-09-15 (×2): 1 via ORAL
  Filled 2015-09-14 (×2): qty 1

## 2015-09-14 MED ORDER — RIVAROXABAN 20 MG PO TABS
20.0000 mg | ORAL_TABLET | Freq: Every day | ORAL | Status: DC
  Administered 2015-09-15: 20 mg via ORAL
  Filled 2015-09-14 (×2): qty 1

## 2015-09-14 MED ORDER — DIPHENHYDRAMINE HCL 25 MG PO CAPS
50.0000 mg | ORAL_CAPSULE | Freq: Once | ORAL | Status: AC
  Administered 2015-09-14: 50 mg via ORAL
  Filled 2015-09-14: qty 2

## 2015-09-14 NOTE — Progress Notes (Signed)
Pt uses his home cpap and states he will wear tonight once he is ready for bed. RN aware. Pt states that he does not need any assistance w/ home device. Inform pt if need assistance don't hesitate to inform me. Pt is stable at this time, no complications noted.

## 2015-09-14 NOTE — Progress Notes (Signed)
PROGRESS NOTE  Gregory Cortez ZOX:[id number] DOB: 1956/04/25 DOA: 09/13/2015 PCP: Nelwyn Salisbury, MD  Assessment/Plan: Acute hypoxia - ?pnuemonia, ?PE, obesity (?Pickwinian syndrome) -Chest x-ray showed cardiomegaly with diffuse interstitial prominence and peribronchial thickening, could reflect bronchitis- add abx - CTA chest non conclusive -heparin -Currently not wheezing -v/q scan  Obstructive Sleep Apnea -CPAP QHS  Abdominal pain -CT abdomen pelvis with contrast showed no acute findings-- suspect PNA? -Continue conservative management, currently has no more abdominal pain. Denies any nausea or vomiting. -Lipase 35, LFTs normal  Fe def anemia -Supposed the patient's hemoglobin was probably 15 1 week ago. However after reviewing chart, baseline hemoglobin appears to be 14. Currently 12.9 -start Fe Will need colonoscopy as outpatient -FOBT negative -Continue to monitor CBC POC negative for occult blood  Fatigue -Will obtain TSH -has been ongoing for one month, doubt this is due to anemia  History of pulmonary embolism, thrombosis of the arm -heparin-- resume xarelto if v/q negative  Low back pain/knee pain -Continue home medications, tramadol and oxycodone, Robaxin  Anxiety -Continue Xanax as needed  GERD -Continue PPI  Morbid obesity -Nutrition consulted. Patient did discuss lifestyle modifications including diet and exercise with his primary care physician upon discharge.  Code Status: full Family Communication: patient/wife Disposition Plan:   Consultants:    Procedures:      HPI/Subjective: C/o headache  Objective: Filed Vitals:   09/14/15 1114  BP: 117/64  Pulse: 73  Temp: 99 F (37.2 C)  Resp:     Intake/Output Summary (Last 24 hours) at 09/14/15 1117 Last data filed at 09/14/15 0900  Gross per 24 hour  Intake 971.25 ml  Output    500 ml  Net 471.25 ml   Filed Weights   09/13/15 1254 09/13/15 2100 09/14/15 0427  Weight:  168.421 kg (371 lb 4.8 oz) 168.4 kg (371 lb 4.1 oz) 167.922 kg (370 lb 3.2 oz)    Exam:   General:  Awake, NAD  Cardiovascular: rrr  Respiratory: clear  Abdomen: +BS, soft  Musculoskeletal: no edema   Data Reviewed: Basic Metabolic Panel:  Recent Labs Lab 09/13/15 1210 09/14/15 0235  NA 133* 139  K 3.7 4.2  CL 102 105  CO2 24 29  GLUCOSE 157* 132*  BUN 8 6  CREATININE 0.94 0.78  CALCIUM 8.5* 8.8*   Liver Function Tests:  Recent Labs Lab 09/13/15 1210  AST 41  ALT 46  ALKPHOS 65  BILITOT 0.9  PROT 6.6  ALBUMIN 2.8*    Recent Labs Lab 09/13/15 1210  LIPASE 35   No results for input(s): AMMONIA in the last 168 hours. CBC:  Recent Labs Lab 09/13/15 1210 09/14/15 0235  WBC 6.6 6.3  NEUTROABS 3.5  --   HGB 12.9* 12.3*  HCT 38.6* 37.5*  MCV 94.6 95.9  PLT 260 259   Cardiac Enzymes:  Recent Labs Lab 09/13/15 1451  TROPONINI <0.03   BNP (last 3 results)  Recent Labs  02/20/15 2151 09/13/15 1451  BNP 14.5 41.8    ProBNP (last 3 results) No results for input(s): PROBNP in the last 8760 hours.  CBG: No results for input(s): GLUCAP in the last 168 hours.  No results found for this or any previous visit (from the past 240 hour(s)).   Studies: Dg Chest 2 View  09/13/2015   CLINICAL DATA:  Flu-like symptoms for 9 days. Nausea, vomiting, dizziness, fever.  EXAM: CHEST  2 VIEW  COMPARISON:  08/18/2015  FINDINGS: Cardiomegaly with vascular congestion. Diffuse interstitial prominence  throughout the lungs without confluent opacity or effusion. No acute bony abnormality.  IMPRESSION: Cardiomegaly with diffuse interstitial prominence and peribronchial thickening. Findings could reflect bronchitis, less likely edema.   Electronically Signed   By: Charlett Nose M.D.   On: 09/13/2015 14:10   Ct Angio Chest Pe W/cm &/or Wo Cm  09/13/2015   CLINICAL DATA:  New patient with history of prior PEs. Shortness of breath.  EXAM: CT ANGIOGRAPHY CHEST WITH  CONTRAST  TECHNIQUE: Multidetector CT imaging of the chest was performed using the standard protocol during bolus administration of intravenous contrast. Multiplanar CT image reconstructions and MIPs were obtained to evaluate the vascular anatomy.  CONTRAST:  100 cc Omnipaque 350  COMPARISON:  Chest CT 11/15/2011  FINDINGS: Mediastinum/Nodes: No enlarged axillary, mediastinal or hilar lymphadenopathy. Normal heart size. No pericardial effusion. Aorta and main pulmonary artery are normal in caliber. There is a large hiatal hernia.  Poor opacification of the pulmonary arteries. Given the poor bolus opacification of the pulmonary arteries, study is essentially nondiagnostic for evaluation of pulmonary embolism. Suggestion of a few tiny filling defects within the distal pulmonary arteries, particularly within the right lower lobe are nonspecific and may be secondary to poor bolus technique and quantum mottle artifact versus small pulmonary emboli.  Lungs/Pleura: Central airways are patent. No large consolidative pulmonary opacities. No pleural effusion or pneumothorax.  Upper abdomen: Liver is diffusely low in attenuation compatible with steatosis.  Musculoskeletal: Thoracic spine degenerative changes. No aggressive or acute appearing osseous lesions.  Review of the MIP images confirms the above findings.  IMPRESSION: Poor opacification of the pulmonary arteries. Given the poor bolus opacification of the pulmonary arteries, study is essentially nondiagnostic for evaluation of pulmonary embolism. Suggestion of a few tiny filling defects within the distal pulmonary arteries, particularly within the right lower lobe are nonspecific and may be secondary to poor bolus technique and quantum mottle artifact versus small pulmonary emboli. As the patient is unable to receive more IV contrast today, recommend further evaluation with V/Q scan.  These results were called by telephone at the time of interpretation on 09/13/2015 at  5:47 pm to Dr. Catha Gosselin, who verbally acknowledged these results.   Electronically Signed   By: Annia Belt M.D.   On: 09/13/2015 17:49   Ct Abdomen Pelvis W Contrast  09/13/2015   CLINICAL DATA:  Left lower quadrant abdominal pain. Fever, shortness of breath.  EXAM: CT ABDOMEN AND PELVIS WITH CONTRAST  TECHNIQUE: Multidetector CT imaging of the abdomen and pelvis was performed using the standard protocol following bolus administration of intravenous contrast.  CONTRAST:  OMNIPAQUE IOHEXOL 300 MG/ML  SOLN  COMPARISON:  09/29/2012  FINDINGS: Lung bases are clear.  No effusions.  Heart is normal size.  Liver, gallbladder, spleen, pancreas, adrenals and kidneys are normal. Patient is status post appendectomy. Stomach, large and small bowel unremarkable. No free fluid, free air or adenopathy. No diverticulosis or diverticulitis. Urinary bladder is unremarkable. Aorta is normal caliber.  No acute bony abnormality or focal bone lesion.  IMPRESSION: No acute findings in the abdomen or pelvis.   Electronically Signed   By: Charlett Nose M.D.   On: 09/13/2015 14:34    Scheduled Meds: . acidophilus  1 capsule Oral Daily  . amoxicillin-clavulanate  1 tablet Oral Q12H  . docusate sodium  100 mg Oral BID  . folic acid  1 mg Oral Daily  . multivitamin with minerals  1 tablet Oral q morning - 10a  . pantoprazole  40 mg Oral Daily  . sodium chloride  3 mL Intravenous Q12H   Continuous Infusions: . heparin 1,800 Units/hr (09/14/15 0857)   Antibiotics Given (last 72 hours)    None      Principal Problem:   Hypoxia Active Problems:   Sleep apnea   GERD   LOW BACK PAIN   Chronic anticoagulation for h/o Pulmonary embolism   Obesity, Class III, BMI 40-49.9 (morbid obesity)    Time spent: 25 min    VANN, JESSICA  [facility] Pager [phone] If 7PM-7AM, please contact night-coverage at [email or web address], password [facility] 09/14/2015, 11:17 AM

## 2015-09-14 NOTE — Progress Notes (Signed)
ANTICOAGULATION CONSULT NOTE - Follow Up Consult  Pharmacy Consult for heparin Indication: r/o PE  Allergies  Allergen Reactions  . Prednisone Other (See Comments)    agitation    Patient Measurements: Height:  (177.8 cm) Weight: (!) 370 lb 3.2 oz (167.922 kg) IBW/kg (Calculated) : 73 Heparin Dosing Weight: 114 kg  Vital Signs: Temp: 99 F (37.2 C) (09/26 1114) Temp Source: Oral (09/26 1114) BP: 99/55 mmHg (09/26 1400) Pulse Rate: 82 (09/26 1400)  Labs:  Recent Labs  09/13/15 1210 09/13/15 1451 09/14/15 0235 09/14/15 1327 09/14/15 1930  HGB 12.9*  --  12.3*  --   --   HCT 38.6*  --  37.5*  --   --   PLT 260  --  259  --   --   APTT  --   --  47*  --  64*  HEPARINUNFRC  --   --  0.93* 0.49 0.38  CREATININE 0.94  --  0.78  --   --   TROPONINI  --  <0.03  --   --   --     Estimated Creatinine Clearance: 161.9 mL/min (by C-G formula based on Cr of 0.78).   Medications:  Scheduled:  . acidophilus  1 capsule Oral Daily  . amoxicillin-clavulanate  1 tablet Oral Q12H  . docusate sodium  100 mg Oral BID  . folic acid  1 mg Oral Daily  . multivitamin with minerals  1 tablet Oral q morning - 10a  . pantoprazole  40 mg Oral Daily  . rivaroxaban  20 mg Oral Daily  . sodium chloride  3 mL Intravenous Q12H   Infusions:    Assessment: 59 yo male with r/o PE is currently on therapeutic heparin.  Heparin level is 0.38 which correlates well (meaning that the effect from apixaban is mostly likely gone). Goal of Therapy:  Heparin level 0.3-0.7 units/ml Monitor platelets by anticoagulation protocol: Yes   Plan:  - Heparin was discontinued and changed to xarelto  So, Tsz-Yin 09/14/2015,8:01 PM

## 2015-09-14 NOTE — Progress Notes (Signed)
Nutrition Consult/Brief Note  RD consulted for obesity, weight loss education.    RD provided "Weight Loss Tips" handout from the [employer].  Pt opted to review information on his own time.  Body mass index is 53.12 kg/(m^2). Pt meets criteria for Obesity Class III based on current BMI.  Current diet order is Carbohydrate Modified.  Labs and medications reviewed. No further nutrition interventions warranted at this time. RD contact information provided. If additional nutrition issues arise, please re-consult RD.  Maureen Chatters, RD, LDN Pager #: [phone] After-Hours Pager #: [phone]

## 2015-09-14 NOTE — Progress Notes (Signed)
Pt reports headache that has been persistent for 9 days.  Currently headache on left side.  Neuro intact, PERRLA.  NP notified with new orders for pain medication.  Pt declines pain medication at present.  Will continue to monitor.

## 2015-09-14 NOTE — Progress Notes (Signed)
ANTICOAGULATION CONSULT NOTE - Follow Up Consult  Pharmacy Consult for heparin  Indication: r/o PE  Allergies  Allergen Reactions  . Prednisone Other (See Comments)    agitation    Patient Measurements: Height:  (177.8 cm) Weight: (!) 370 lb 3.2 oz (167.922 kg) IBW/kg (Calculated) : 73 Heparin Dosing Weight: 114kg  Vital Signs: Temp: 99 F (37.2 C) (09/26 1114) Temp Source: Oral (09/26 1114) BP: 99/55 mmHg (09/26 1400) Pulse Rate: 82 (09/26 1400)  Labs:  Recent Labs  09/13/15 1210 09/13/15 1451 09/14/15 0235 09/14/15 1327  HGB 12.9*  --  12.3*  --   HCT 38.6*  --  37.5*  --   PLT 260  --  259  --   APTT  --   --  47*  --   HEPARINUNFRC  --   --  0.93* 0.49  CREATININE 0.94  --  0.78  --   TROPONINI  --  <0.03  --   --     Estimated Creatinine Clearance: 161.9 mL/min (by C-G formula based on Cr of 0.78).   Medications:  Scheduled:  . acidophilus  1 capsule Oral Daily  . amoxicillin-clavulanate  1 tablet Oral Q12H  . docusate sodium  100 mg Oral BID  . folic acid  1 mg Oral Daily  . multivitamin with minerals  1 tablet Oral q morning - 10a  . pantoprazole  40 mg Oral Daily  . sodium chloride  3 mL Intravenous Q12H   Infusions:  . heparin 1,800 Units/hr (09/14/15 1428)    Assessment: 59 y.o. male with a history of pulmonary embolism on Xarelto PTA , who presented with complaints of fever chills, fatigue, abdominal pain. He on now on heparin for r/o PE and for VQ scan. -heparin level = 0.49 and at goal (last xarelto dose was 9/25 in am)  Goal of Therapy:  Heparin level 0.3-0.7 units/ml aPTT 66-102 seconds Monitor platelets by anticoagulation protocol: Yes   Plan:  -No heparin changes needed -heparin level with aPTT in 6 hours to confirm -Daily heparin level and CBC  Harland German, Pharm D 09/14/2015 2:33 PM

## 2015-09-15 DIAGNOSIS — J209 Acute bronchitis, unspecified: Secondary | ICD-10-CM

## 2015-09-15 LAB — CBC
HCT: 37.3 % — ABNORMAL LOW (ref 39.0–52.0)
Hemoglobin: 12.3 g/dL — ABNORMAL LOW (ref 13.0–17.0)
MCH: 32.2 pg (ref 26.0–34.0)
MCHC: 33 g/dL (ref 30.0–36.0)
MCV: 97.6 fL (ref 78.0–100.0)
Platelets: 287 10*3/uL (ref 150–400)
RBC: 3.82 MIL/uL — ABNORMAL LOW (ref 4.22–5.81)
RDW: 13.8 % (ref 11.5–15.5)
WBC: 6.1 10*3/uL (ref 4.0–10.5)

## 2015-09-15 LAB — BASIC METABOLIC PANEL
Anion gap: 9 (ref 5–15)
BUN: 8 mg/dL (ref 6–20)
CO2: 26 mmol/L (ref 22–32)
Calcium: 8.5 mg/dL — ABNORMAL LOW (ref 8.9–10.3)
Chloride: 105 mmol/L (ref 101–111)
Creatinine, Ser: 0.82 mg/dL (ref 0.61–1.24)
GFR calc Af Amer: >60 mL/min (ref >60)
GFR calc non Af Amer: >60 mL/min (ref >60)
Glucose, Bld: 142 mg/dL — ABNORMAL HIGH (ref 65–99)
Potassium: 3.6 mmol/L (ref 3.5–5.1)
Sodium: 140 mmol/L (ref 135–145)

## 2015-09-15 MED ORDER — AMOXICILLIN-POT CLAVULANATE 875-125 MG PO TABS: 1.0000 | ORAL_TABLET | Freq: Two times a day (BID) | ORAL | Status: DC

## 2015-09-15 MED ORDER — RISAQUAD PO CAPS: 1.0000 | ORAL_CAPSULE | Freq: Every day | ORAL | Status: DC

## 2015-09-15 NOTE — Discharge Summary (Signed)
Physician Discharge Summary  Gregory Cortez WUJ:[id number] DOB: 02-Oct-1956 DOA: 09/13/2015  PCP: Nelwyn Salisbury, MD  Admit date: 09/13/2015 Discharge date: 09/15/2015  Time spent: 35 minutes  Recommendations for Outpatient Follow-up:  1. Referral for medical weight loss clinic 2. Cbc 1 week  Discharge Diagnoses:  Principal Problem:   Hypoxia Active Problems:   Sleep apnea   GERD   LOW BACK PAIN   Chronic anticoagulation for h/o Pulmonary embolism   Obesity, Class III, BMI 40-49.9 (morbid obesity)   Acute respiratory failure with hypoxia   Discharge Condition: improved  Diet recommendation: cardiac  Filed Weights   09/13/15 2100 09/14/15 0427 09/15/15 0504  Weight: 168.4 kg (371 lb 4.1 oz) 167.922 kg (370 lb 3.2 oz) 169.373 kg (373 lb 6.4 oz)    History of present illness:  Gregory Cortez is a 59 y.o. male  With a history of pulmonary embolism on Xarelto, who presented with complaints of fever chills, fatigue, abdominal pain for approximately 9 days. Patient had seen his primary care physician approximately one week ago and was given prednisone however reacted negatively. Patient went to see urgent care today and was struck to see emergency department. He did have a negative influenza. Patient was thought to have possible diverticulitis or upper respiratory infection, patient was directed to emergency department. CT of the abdomen was negative. Chest x-ray was obtained, check questionable bronchitis. Of note patient stated his hemoglobin was approximately 15 one week ago, currently 12.9. Patient denied any cough, congestion, headache, dizziness, chest pain, urinary symptoms change in bowel habits, recent travel or ill contacts. Patient does admit to being under stress at work. Upon ambulation in the emergency department, patient was found to be hypoxic, oxygen saturations of 87%. [facility] called for admission  Hospital Course:  Acute hypoxia - with pnuemonia/bronchitis vs obesity  (?Pickwinian syndrome) -Chest x-ray showed cardiomegaly with diffuse interstitial prominence and peribronchial thickening, could reflect bronchitis- add abx - CTA chest non conclusive and V/Q negative -resume xarelto  Obstructive Sleep Apnea -CPAP QHS  Abdominal pain -CT abdomen pelvis with contrast showed no acute findings-- suspect PNA -Continue conservative management, currently has no more abdominal pain. Denies any nausea or vomiting. -Lipase 35, LFTs normal  Fe def anemia -Supposed the patient's hemoglobin was probably 15 1 week ago. However after reviewing chart, baseline hemoglobin appears to be 14. Currently 12.9 -Fe Will need colonoscopy as outpatient -FOBT negative  History of pulmonary embolism, thrombosis of the arm -xarelto  Low back pain/knee pain -Continue home medications, tramadol and oxycodone, Robaxin  Anxiety -Continue Xanax as needed  GERD -Continue PPI  Morbid obesity -recommend medically supervised weight loss  Procedures:    Consultations:    Discharge Exam: Filed Vitals:   09/15/15 0504  BP: 115/81  Pulse: 75  Temp: 97.8 F (36.6 C)  Resp: 20    General: awake, feeling better   Discharge Instructions   Discharge Instructions    Diet - low sodium heart healthy    Complete by:  As directed      Diet Carb Modified    Complete by:  As directed      Increase activity slowly    Complete by:  As directed           Current Discharge Medication List    START taking these medications   Details  acidophilus (RISAQUAD) CAPS capsule Take 1 capsule by mouth daily. Qty: 10 capsule, Refills: 0    amoxicillin-clavulanate (AUGMENTIN) 875-125 MG per tablet Take 1  tablet by mouth every 12 (twelve) hours. Qty: 8 tablet, Refills: 0      CONTINUE these medications which have NOT CHANGED   Details  acetaminophen (TYLENOL) 500 MG tablet Take 1,000 mg by mouth every 4 (four) hours as needed (pain).    ALPRAZolam (XANAX) 1 MG tablet  TAKE 1 TABLET BY MOUTH 3 TIMES A DAY AS NEEDED Qty: 90 tablet, Refills: 5    diphenhydramine-acetaminophen (TYLENOL PM) 25-500 MG TABS Take 2 tablets by mouth at bedtime.     docusate sodium (COLACE) 100 MG capsule Take 1 capsule (100 mg total) by mouth 2 (two) times daily. Qty: 2 capsule, Refills: 0    folic acid (FOLVITE) 1 MG tablet TAKE 1 TABLET BY MOUTH EVERY DAY Qty: 30 tablet, Refills: 9    furosemide (LASIX) 40 MG tablet Take 1 tablet (40 mg total) by mouth as needed. Qty: 90 tablet, Refills: 3    methocarbamol (ROBAXIN) 500 MG tablet Take 500 mg by mouth every 8 (eight) hours as needed for muscle spasms.  Refills: 0    Multiple Vitamin (MULTIVITAMIN WITH MINERALS) TABS tablet Take 1 tablet by mouth daily.    omeprazole (PRILOSEC) 40 MG capsule TAKE 1 CAPSULE (40 MG TOTAL) BY MOUTH DAILY. Qty: 30 capsule, Refills: 3    oxyCODONE-acetaminophen (PERCOCET) 10-325 MG per tablet Take 1 tablet by mouth at bedtime as needed for pain.  Refills: 0    polyethylene glycol (MIRALAX / GLYCOLAX) packet Take 17 g by mouth daily as needed. Qty: 1 each    XARELTO 20 MG TABS tablet TAKE 1 TABLET BY MOUTH EVERY DAY Qty: 30 tablet, Refills: 11    traMADol (ULTRAM) 50 MG tablet Take 2 tablets (100 mg total) by mouth every 6 (six) hours as needed. Qty: 240 tablet, Refills: 2      STOP taking these medications     sildenafil (VIAGRA) 100 MG tablet        Allergies  Allergen Reactions  . Prednisone Other (See Comments)    agitation      The results of significant diagnostics from this hospitalization (including imaging, microbiology, ancillary and laboratory) are listed below for reference.    Significant Diagnostic Studies: Dg Chest 2 View  09/13/2015   CLINICAL DATA:  Flu-like symptoms for 9 days. Nausea, vomiting, dizziness, fever.  EXAM: CHEST  2 VIEW  COMPARISON:  08/18/2015  FINDINGS: Cardiomegaly with vascular congestion. Diffuse interstitial prominence throughout the  lungs without confluent opacity or effusion. No acute bony abnormality.  IMPRESSION: Cardiomegaly with diffuse interstitial prominence and peribronchial thickening. Findings could reflect bronchitis, less likely edema.   Electronically Signed   By: Charlett Nose M.D.   On: 09/13/2015 14:10   Ct Angio Chest Pe W/cm &/or Wo Cm  09/13/2015   CLINICAL DATA:  New patient with history of prior PEs. Shortness of breath.  EXAM: CT ANGIOGRAPHY CHEST WITH CONTRAST  TECHNIQUE: Multidetector CT imaging of the chest was performed using the standard protocol during bolus administration of intravenous contrast. Multiplanar CT image reconstructions and MIPs were obtained to evaluate the vascular anatomy.  CONTRAST:  100 cc Omnipaque 350  COMPARISON:  Chest CT 11/15/2011  FINDINGS: Mediastinum/Nodes: No enlarged axillary, mediastinal or hilar lymphadenopathy. Normal heart size. No pericardial effusion. Aorta and main pulmonary artery are normal in caliber. There is a large hiatal hernia.  Poor opacification of the pulmonary arteries. Given the poor bolus opacification of the pulmonary arteries, study is essentially nondiagnostic for evaluation of pulmonary embolism.  Suggestion of a few tiny filling defects within the distal pulmonary arteries, particularly within the right lower lobe are nonspecific and may be secondary to poor bolus technique and quantum mottle artifact versus small pulmonary emboli.  Lungs/Pleura: Central airways are patent. No large consolidative pulmonary opacities. No pleural effusion or pneumothorax.  Upper abdomen: Liver is diffusely low in attenuation compatible with steatosis.  Musculoskeletal: Thoracic spine degenerative changes. No aggressive or acute appearing osseous lesions.  Review of the MIP images confirms the above findings.  IMPRESSION: Poor opacification of the pulmonary arteries. Given the poor bolus opacification of the pulmonary arteries, study is essentially nondiagnostic for evaluation of  pulmonary embolism. Suggestion of a few tiny filling defects within the distal pulmonary arteries, particularly within the right lower lobe are nonspecific and may be secondary to poor bolus technique and quantum mottle artifact versus small pulmonary emboli. As the patient is unable to receive more IV contrast today, recommend further evaluation with V/Q scan.  These results were called by telephone at the time of interpretation on 09/13/2015 at 5:47 pm to Dr. Catha Gosselin, who verbally acknowledged these results.   Electronically Signed   By: Annia Belt M.D.   On: 09/13/2015 17:49   Ct Abdomen Pelvis W Contrast  09/13/2015   CLINICAL DATA:  Left lower quadrant abdominal pain. Fever, shortness of breath.  EXAM: CT ABDOMEN AND PELVIS WITH CONTRAST  TECHNIQUE: Multidetector CT imaging of the abdomen and pelvis was performed using the standard protocol following bolus administration of intravenous contrast.  CONTRAST:  OMNIPAQUE IOHEXOL 300 MG/ML  SOLN  COMPARISON:  09/29/2012  FINDINGS: Lung bases are clear.  No effusions.  Heart is normal size.  Liver, gallbladder, spleen, pancreas, adrenals and kidneys are normal. Patient is status post appendectomy. Stomach, large and small bowel unremarkable. No free fluid, free air or adenopathy. No diverticulosis or diverticulitis. Urinary bladder is unremarkable. Aorta is normal caliber.  No acute bony abnormality or focal bone lesion.  IMPRESSION: No acute findings in the abdomen or pelvis.   Electronically Signed   By: Charlett Nose M.D.   On: 09/13/2015 14:34   Nm Pulmonary Perf And Vent  09/14/2015   CLINICAL DATA:  Hypoxemia. Poor pulmonary artery opacification on a recent chest CTA, resulting in an essentially nondiagnostic examination with a suggestion of a few tiny filling defects within the distal pulmonary arteries.  EXAM: NUCLEAR MEDICINE VENTILATION - PERFUSION LUNG SCAN  TECHNIQUE: Ventilation images were obtained in multiple projections using inhaled  aerosol Tc-15m DTPA. Perfusion images were obtained in multiple projections after intravenous injection of Tc-58m MAA.  RADIOPHARMACEUTICALS:  42.9 Technetium-83m DTPA aerosol inhalation and 6.5 Technetium-46m MAA IV  COMPARISON:  Chest radiographs and chest CTA dated 09/13/2015.  FINDINGS: Ventilation: No focal ventilation defect.  Perfusion: No wedge shaped peripheral perfusion defects to suggest acute pulmonary embolism.  IMPRESSION: Normal examination.  No evidence of pulmonary embolism.   Electronically Signed   By: Beckie Salts M.D.   On: 09/14/2015 16:49    Microbiology: No results found for this or any previous visit (from the past 240 hour(s)).   Labs: Basic Metabolic Panel:  Recent Labs Lab 09/13/15 1210 09/14/15 0235 09/15/15 0314  NA 133* 139 140  K 3.7 4.2 3.6  CL 102 105 105  CO2 GLUCOSE 157* 132* 142*  BUN CREATININE 0.94 0.78 0.82  CALCIUM 8.5* 8.8* 8.5*   Liver Function Tests:  Recent Labs Lab 09/13/15 1210  AST 41  ALT 46  ALKPHOS 65  BILITOT 0.9  PROT 6.6  ALBUMIN 2.8*    Recent Labs Lab 09/13/15 1210  LIPASE 35   No results for input(s): AMMONIA in the last 168 hours. CBC:  Recent Labs Lab 09/13/15 1210 09/14/15 0235 09/15/15 0314  WBC 6.6 6.3 6.1  NEUTROABS 3.5  --   --   HGB 12.9* 12.3* 12.3*  HCT 38.6* 37.5* 37.3*  MCV 94.6 95.9 97.6  PLT 260 259 287   Cardiac Enzymes:  Recent Labs Lab 09/13/15 1451  TROPONINI <0.03   BNP: BNP (last 3 results)  Recent Labs  02/20/15 2151 09/13/15 1451  BNP 14.5 41.8    ProBNP (last 3 results) No results for input(s): PROBNP in the last 8760 hours.  CBG: No results for input(s): GLUCAP in the last 168 hours.     SignedMarlin Canary  [facility] 09/15/2015, 11:18 AM

## 2015-09-21 ENCOUNTER — Other Ambulatory Visit: Payer: Self-pay | Admitting: Family Medicine

## 2015-09-22 ENCOUNTER — Ambulatory Visit: Payer: BLUE CROSS/BLUE SHIELD | Admitting: Family Medicine

## 2015-09-22 ENCOUNTER — Telehealth: Payer: Self-pay | Admitting: Family Medicine

## 2015-09-22 DIAGNOSIS — Z0289 Encounter for other administrative examinations: Secondary | ICD-10-CM

## 2015-09-22 NOTE — Telephone Encounter (Signed)
Pt was on schedule to see Dr. Clent Ridges today 09/22/2015. I called and left a voice message for pt to reschedule, Dr. Clent Ridges is aware.

## 2015-09-28 ENCOUNTER — Ambulatory Visit (INDEPENDENT_AMBULATORY_CARE_PROVIDER_SITE_OTHER): Payer: 59 | Admitting: Adult Health

## 2015-09-28 ENCOUNTER — Encounter: Payer: Self-pay | Admitting: Adult Health

## 2015-09-28 VITALS — BP 104/80 | Temp 98.3°F | Ht 70.0 in | Wt 373.1 lb

## 2015-09-28 DIAGNOSIS — L03116 Cellulitis of left lower limb: Secondary | ICD-10-CM | POA: Diagnosis not present

## 2015-09-28 MED ORDER — CLINDAMYCIN HCL 300 MG PO CAPS: 300.0000 mg | ORAL_CAPSULE | Freq: Three times a day (TID) | ORAL | Status: DC

## 2015-09-28 NOTE — Progress Notes (Signed)
Subjective:    Patient ID: Gregory Cortez, male    DOB: 15-Mar-1956, 59 y.o.   MRN: [id number]  HPI  59 year old gentleman who presents to the office today for redness,warmth andand pain to left lower leg. He first noticed " three red itchy circles yesterday. Today througout the day the redness, warmth , swelling and pain has spread. Throughout the lower leg. Unsure if he was bitten by anything.   No history of cellulitis or MRSA infection in the past.   Review of Systems  Constitutional: Negative.   Respiratory: Negative.   Cardiovascular: Negative.   Skin: Positive for color change and rash. Negative for wound.  All other systems reviewed and are negative.  Past Medical History  Diagnosis Date  . OSA (obstructive sleep apnea)     dr Shelle Iron  . Testosterone deficiency     dr Patsi Sears, shots every 2 weeks  . ED (erectile dysfunction)   . GERD (gastroesophageal reflux disease)     [facility]  . Low back pain     dr Channing Mutters, dr Farris Has, dr Ethelene Hal, herniated disc L4-5  . PE (pulmonary embolism)     bilateral sep 2011 and again bilateral May 2012  . 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 Primary hypercoagulable state (HCC)     no etiology found per Dr. Shirline Frees   . Hx of colonoscopy   . Pulmonary embolism (HCC) 02/22/2010    CT angio Dx  . Chest pain     Social History   Social History  . Marital Status: Married    Spouse Name: N/A  . Number of Children: 3  . Years of Education: N/A   Occupational History  . Desk job-petroleum dispatcher    Social History Main Topics  . Smoking status: Never Smoker   . Smokeless tobacco: Never Used     Comment: never used product  . Alcohol Use: No  . Drug Use: No  . Sexual Activity: Not on file   Other Topics Concern  . Not on file   Social History Narrative    Past Surgical History  Procedure Laterality Date  . Knee surgery      both  . Shoulder surgery      right and left  . Esi      L4-5 dr Channing Mutters 03/2010  . Rotator cuff  repair  08-20-10    left dr Wyline Mood complicated by PE  . Esophagogastroduodenoscopy      x2 - normal except reflux  . Laparoscopic appendectomy  09/13/2012    Procedure: APPENDECTOMY LAPAROSCOPIC;  Surgeon: Almond Lint, MD;  Location: [facility];  Service: General;  Laterality: N/A;  . Cardiac stress test x 2     . Appendectomy      Family History  Problem Relation Age of Onset  . Hypertension Other   . Cancer Other     lung  . Coronary artery disease Neg Hx     Allergies  Allergen Reactions  . Prednisone Other (See Comments)    agitation    Current Outpatient Prescriptions on File Prior to Visit  Medication Sig Dispense Refill  . acetaminophen (TYLENOL) 500 MG tablet Take 1,000 mg by mouth every 4 (four) hours as needed (pain).    [name] Kitchen acidophilus (RISAQUAD) CAPS capsule Take 1 capsule by mouth daily. 10 capsule 0  . ALPRAZolam (XANAX) 1 MG tablet TAKE 1 TABLET BY MOUTH 3 TIMES A DAY AS NEEDED (Patient taking  differently: TAKE 1 TABLET BY MOUTH 3 TIMES A DAY AS NEEDED FOR ANXIETY) 90 tablet 5  . amoxicillin-clavulanate (AUGMENTIN) 875-125 MG per tablet Take 1 tablet by mouth every 12 (twelve) hours. 8 tablet 0  . diphenhydramine-acetaminophen (TYLENOL PM) 25-500 MG TABS Take 2 tablets by mouth at bedtime.     . docusate sodium (COLACE) 100 MG capsule Take 1 capsule (100 mg total) by mouth 2 (two) times daily. (Patient taking differently: Take 100 mg by mouth 2 (two) times daily as needed (constipation). ) 2 capsule 0  . folic acid (FOLVITE) 1 MG tablet TAKE 1 TABLET BY MOUTH EVERY DAY 30 tablet 9  . furosemide (LASIX) 40 MG tablet Take 1 tablet (40 mg total) by mouth as needed. (Patient taking differently: Take 40 mg by mouth daily as needed for fluid or edema. ) 90 tablet 3  . methocarbamol (ROBAXIN) 500 MG tablet Take 500 mg by mouth every 8 (eight) hours as needed for muscle spasms.   0  . Multiple Vitamin (MULTIVITAMIN WITH MINERALS) TABS tablet Take 1 tablet by mouth daily.    [name] Kitchen  omeprazole (PRILOSEC) 40 MG capsule TAKE 1 CAPSULE (40 MG TOTAL) BY MOUTH DAILY. 30 capsule 11  . oxyCODONE-acetaminophen (PERCOCET) 10-325 MG per tablet Take 1 tablet by mouth at bedtime as needed for pain.   0  . polyethylene glycol (MIRALAX / GLYCOLAX) packet Take 17 g by mouth daily as needed. (Patient taking differently: Take 17 g by mouth daily as needed (constipation). ) 1 each   . traMADol (ULTRAM) 50 MG tablet Take 2 tablets (100 mg total) by mouth every 6 (six) hours as needed. 240 tablet 2  . XARELTO 20 MG TABS tablet TAKE 1 TABLET BY MOUTH EVERY DAY (Patient taking differently: TAKE 1 TABLET BY MOUTH EVERY DAY BEFORE BREAKFAST) 30 tablet 11   No current facility-administered medications on file prior to visit.    BP 104/80 mmHg  Temp(Src) 98.3 F (36.8 C) (Oral)  Ht  (1.778 m)  Wt 373 lb 1.6 oz (169.237 kg)  BMI 53.53 kg/m2       Objective:   Physical Exam  Constitutional: He is oriented to person, place, and time. He appears well-developed and well-nourished. No distress.  Musculoskeletal: Normal range of motion. He exhibits edema and tenderness.  Neurological: He is alert and oriented to person, place, and time. No cranial nerve deficit. Coordination normal.  Skin: Skin is warm. Rash noted. He is not diaphoretic. There is erythema.  Redness, warmth, swelling and pain with palpation to left lower leg. Area outlined with skin marker.   Psychiatric: He has a normal mood and affect. His behavior is normal. Judgment and thought content normal.  Nursing note and vitals reviewed.     Assessment & Plan:  1. Cellulitis of leg, left - clindamycin (CLEOCIN) 300 MG capsule; Take 1 capsule (300 mg total) by mouth 3 (three) times daily.  Dispense: 30 capsule; Refill: 0 - Follow up if no improvement in the next 2-3 days.  - Follow up for recheck in 8 days.  - Elevate leg above your heart at night.

## 2015-09-28 NOTE — Progress Notes (Signed)
Pre visit review using our clinic review tool, if applicable. No additional management support is needed unless otherwise documented below in the visit note. 

## 2015-09-28 NOTE — Patient Instructions (Addendum)
It was great meeting you today!  I have called in a prescription for Clindamycin, take this 3 times a day for 10 days.   Follow up with me next Monday or Tuesday for recheck.   If you need anything in the meantime, please let me know.   Cellulitis Cellulitis is an infection of the skin and the tissue beneath it. The infected area is usually red and tender. Cellulitis occurs most often in the arms and lower legs.  CAUSES  Cellulitis is caused by bacteria that enter the skin through cracks or cuts in the skin. The most common types of bacteria that cause cellulitis are staphylococci and streptococci. SIGNS AND SYMPTOMS   Redness and warmth.  Swelling.  Tenderness or pain.  Fever. DIAGNOSIS  Your health care provider can usually determine what is wrong based on a physical exam. Blood tests may also be done. TREATMENT  Treatment usually involves taking an antibiotic medicine. HOME CARE INSTRUCTIONS   Take your antibiotic medicine as directed by your health care provider. Finish the antibiotic even if you start to feel better.  Keep the infected arm or leg elevated to reduce swelling.  Apply a warm cloth to the affected area up to 4 times per day to relieve pain.  Take medicines only as directed by your health care provider.  Keep all follow-up visits as directed by your health care provider. SEEK MEDICAL CARE IF:   You notice red streaks coming from the infected area.  Your red area gets larger or turns dark in color.  Your bone or joint underneath the infected area becomes painful after the skin has healed.  Your infection returns in the same area or another area.  You notice a swollen bump in the infected area.  You develop new symptoms.  You have a fever. SEEK IMMEDIATE MEDICAL CARE IF:   You feel very sleepy.  You develop vomiting or diarrhea.  You have a general ill feeling (malaise) with muscle aches and pains.   This information is not intended to replace  advice given to you by your health care provider. Make sure you discuss any questions you have with your health care provider.   Document Released: 09/14/2005 Document Revised: 08/26/2015 Document Reviewed: 02/20/2012 Elsevier Interactive Patient Education [employer]

## 2015-10-07 ENCOUNTER — Ambulatory Visit: Payer: BLUE CROSS/BLUE SHIELD | Admitting: Pulmonary Disease

## 2015-10-08 ENCOUNTER — Encounter: Payer: Self-pay | Admitting: Nurse Practitioner

## 2015-10-08 ENCOUNTER — Ambulatory Visit: Payer: BLUE CROSS/BLUE SHIELD | Admitting: Internal Medicine

## 2015-10-08 NOTE — Progress Notes (Signed)
[facility] scheduled for patient. Medical clearance was needed from Dr Myna HidalgoEnnever. Dr Myna HidalgoEnnever gave medical clearance with the following conditions: Patient is to stop Xarelto 2 days prior to injection and resume the day after. [facility] and via fax to [phone]. Confirmation received.

## 2015-10-16 ENCOUNTER — Ambulatory Visit: Payer: BLUE CROSS/BLUE SHIELD | Admitting: Cardiovascular Disease

## 2016-01-25 ENCOUNTER — Other Ambulatory Visit: Payer: Self-pay | Admitting: Family Medicine

## 2016-02-22 ENCOUNTER — Ambulatory Visit (INDEPENDENT_AMBULATORY_CARE_PROVIDER_SITE_OTHER): Payer: BLUE CROSS/BLUE SHIELD | Admitting: Family Medicine

## 2016-02-22 ENCOUNTER — Encounter: Payer: Self-pay | Admitting: Family Medicine

## 2016-02-22 VITALS — BP 124/82 | HR 96 | Temp 98.7°F | Ht 70.0 in | Wt 390.3 lb

## 2016-02-22 DIAGNOSIS — J069 Acute upper respiratory infection, unspecified: Secondary | ICD-10-CM

## 2016-02-22 LAB — POCT INFLUENZA A/B
Influenza A, POC: NEGATIVE
Influenza B, POC: NEGATIVE

## 2016-02-22 MED ORDER — BENZONATATE 100 MG PO CAPS: 100.0000 mg | ORAL_CAPSULE | Freq: Two times a day (BID) | ORAL | Status: DC | PRN

## 2016-02-22 NOTE — Patient Instructions (Signed)

## 2016-02-22 NOTE — Progress Notes (Signed)
Pre visit review using our clinic review tool, if applicable. No additional management support is needed unless otherwise documented below in the visit note. 

## 2016-02-22 NOTE — Progress Notes (Signed)
HPI:  Gregory Cortez is a 60 yo here for an acute visit for upper respiratory symptoms: -started: yesterday -symptoms:nasal congestion, sore throat, PND, cough -denies:fever, SOB, NVD, tooth pain -has tried: nothing -sick contacts/travel/risks:  Per his report everyone at wife's work is sick with the flu and she thinks she has flu and thought he has flu and needs tamiflu -Hx of: allergies, anxiety, OSA  ROS: See pertinent positives and negatives per HPI.  Past Medical History  Diagnosis Date  . OSA (obstructive sleep apnea)     dr Shelle Ironclance  . Testosterone deficiency     dr Patsi Searstannenbaum, shots every 2 weeks  . ED (erectile dysfunction)   . GERD (gastroesophageal reflux disease)     [facility]  . Low back pain     dr Channing Muttersroy, dr Farris Haskramer, dr Ethelene Halramos, herniated disc L4-5  . PE (pulmonary embolism)     bilateral sep 2011 and again bilateral May 2012  . 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 Primary hypercoagulable state (HCC)     no etiology found per Dr. Shirline FreesMohammed   . Hx of colonoscopy   . Pulmonary embolism (HCC) 02/22/2010    CT angio Dx  . Chest pain     Past Surgical History  Procedure Laterality Date  . Knee surgery      both  . Shoulder surgery      right and left  . Esi      L4-5 dr Channing Muttersroy 03/2010  . Rotator cuff repair  08-20-10    left dr Wyline Moodweiner complicated by PE  . Esophagogastroduodenoscopy      x2 - normal except reflux  . Laparoscopic appendectomy  09/13/2012    Procedure: APPENDECTOMY LAPAROSCOPIC;  Surgeon: Almond LintFaera Byerly, MD;  Location: [facility];  Service: General;  Laterality: N/A;  . Cardiac stress test x 2     . Appendectomy      Family History  Problem Relation Age of Onset  . Hypertension Other   . Cancer Other     lung  . Coronary artery disease Neg Hx     Social History   Social History  . Marital Status: Married    Spouse Name: N/A  . Number of Children: 3  . Years of Education: N/A   Occupational History  . Desk job-petroleum dispatcher    Social  History Main Topics  . Smoking status: Never Smoker   . Smokeless tobacco: Never Used     Comment: never used product  . Alcohol Use: No  . Drug Use: No  . Sexual Activity: Not Asked   Other Topics Concern  . None   Social History Narrative     Current outpatient prescriptions:  .  acetaminophen (TYLENOL) 500 MG tablet, Take 1,000 mg by mouth every 4 (four) hours as needed (pain)., Disp: , Rfl:  .  acidophilus (RISAQUAD) CAPS capsule, Take 1 capsule by mouth daily., Disp: 10 capsule, Rfl: 0 .  ALPRAZolam (XANAX) 1 MG tablet, TAKE 1 TABLET BY MOUTH 3 TIMES A DAY AS NEEDED (Patient taking differently: TAKE 1 TABLET BY MOUTH 3 TIMES A DAY AS NEEDED FOR ANXIETY), Disp: 90 tablet, Rfl: 5 .  clindamycin (CLEOCIN) 300 MG capsule, Take 1 capsule (300 mg total) by mouth 3 (three) times daily., Disp: 30 capsule, Rfl: 0 .  diphenhydramine-acetaminophen (TYLENOL PM) 25-500 MG TABS, Take 2 tablets by mouth at bedtime. , Disp: , Rfl:  .  docusate sodium (COLACE) 100 MG  capsule, Take 1 capsule (100 mg total) by mouth 2 (two) times daily. (Patient taking differently: Take 100 mg by mouth 2 (two) times daily as needed (constipation). ), Disp: 2 capsule, Rfl: 0 .  folic acid (FOLVITE) 1 MG tablet, TAKE 1 TABLET BY MOUTH EVERY DAY, Disp: 30 tablet, Rfl: 9 .  furosemide (LASIX) 40 MG tablet, Take 1 tablet (40 mg total) by mouth as needed. (Patient taking differently: Take 40 mg by mouth daily as needed for fluid or edema. ), Disp: 90 tablet, Rfl: 3 .  methocarbamol (ROBAXIN) 500 MG tablet, Take 500 mg by mouth every 8 (eight) hours as needed for muscle spasms. , Disp: , Rfl: 0 .  Multiple Vitamin (MULTIVITAMIN WITH MINERALS) TABS tablet, Take 1 tablet by mouth daily., Disp: , Rfl:  .  omeprazole (PRILOSEC) 40 MG capsule, TAKE 1 CAPSULE (40 MG TOTAL) BY MOUTH DAILY., Disp: 30 capsule, Rfl: 11 .  oxyCODONE-acetaminophen (PERCOCET) 10-325 MG per tablet, Take 1 tablet by mouth at bedtime as needed for pain. ,  Disp: , Rfl: 0 .  polyethylene glycol (MIRALAX / GLYCOLAX) packet, Take 17 g by mouth daily as needed. (Patient taking differently: Take 17 g by mouth daily as needed (constipation). ), Disp: 1 each, Rfl:  .  traMADol (ULTRAM) 50 MG tablet, Take 2 tablets (100 mg total) by mouth every 6 (six) hours as needed., Disp: 240 tablet, Rfl: 2 .  XARELTO 20 MG TABS tablet, TAKE 1 TABLET BY MOUTH EVERY DAY, Disp: 30 tablet, Rfl: 11 .  benzonatate (TESSALON PERLES) 100 MG capsule, Take 1 capsule (100 mg total) by mouth 2 (two) times daily as needed for cough., Disp: 20 capsule, Rfl: 0  EXAM:  Filed Vitals:   02/22/16 1516  BP: 124/82  Pulse: 96  Temp: 98.7 F (37.1 C)    Body mass index is 56 kg/(m^2).  GENERAL: vitals reviewed and listed above, alert, oriented, appears well hydrated and in no acute distress  HEENT: atraumatic, conjunttiva clear, no obvious abnormalities on inspection of external nose and ears, normal appearance of ear canals and TMs, clear nasal congestion, mild post oropharyngeal erythema with PND, no tonsillar edema or exudate, no sinus TTP  NECK: no obvious masses on inspection  LUNGS: clear to auscultation bilaterally, no wheezes, rales or rhonchi, good air movement  CV: HRRR, no peripheral edema  MS: moves all extremities without noticeable abnormality  PSYCH: pleasant and cooperative, no obvious depression or anxiety  ASSESSMENT AND PLAN:  Discussed the following assessment and plan:  Viral upper respiratory illness - Plan: POC Influenza A/B  -given HPI and exam findings today, a serious infection or illness is unlikely. Rapid flu neg. Discussed limitations of the test and risks/limitation of Tamiflu. He opted against tamiflu even though this is what his wife sent him here for. We discussed potential etiologies, with VURI being most likely, and opted for supportive care and monitoring. We discussed treatment side effects, likely course, potential complications,  antibiotic misuse, transmission, and signs of developing a serious illness. -of course, we advised to return or notify a doctor immediately if symptoms worsen or persist or new concerns arise.    Patient Instructions  INSTRUCTIONS FOR UPPER RESPIRATORY INFECTION:  -plenty of rest and fluids  -nasal saline wash 2-3 times daily (use prepackaged nasal saline or bottled/distilled water if making your own)   -can use AFRIN nasal spray for drainage and nasal congestion - but do NOT use longer then 3-4 days  -can use tylenol (  in no history of liver disease) or ibuprofen (if no history of kidney disease, bowel bleeding or significant heart disease) as directed for aches and sorethroat  -in the winter time, using a humidifier at night is helpful (please follow cleaning instructions)  -if you are taking a cough medication - use only as directed, may also try a teaspoon of honey to coat the throat and throat lozenges. If given a cough medication with codeine or hydrocodone or other narcotic please be advised that this contains a strong and  potentially addicting medication. Please follow instructions carefully, take as little as possible and only use AS NEEDED for severe cough. Discuss potential side effects with your pharmacy. Please do not drive or operate machinery while taking these types of medications. Please do not take other sedating medications, drugs or alcohol while taking this medication without discussing with your doctor.  -for sore throat, salt water gargles can help  -follow up if you have fevers, facial pain, tooth pain, difficulty breathing or are worsening or symptoms persist longer then expected  Upper Respiratory Infection, Adult An upper respiratory infection (URI) is also known as the common cold. It is often caused by a type of germ (virus). Colds are easily spread (contagious). You can pass it to others by kissing, coughing, sneezing, or drinking out of the same glass. Usually,  you get better in 1 to 3  weeks.  However, the cough can last for even longer. HOME CARE   Only take medicine as told by your doctor. Follow instructions provided above.  Drink enough water and fluids to keep your pee (urine) clear or pale yellow.  Get plenty of rest.  Return to work when your temperature is < 100 for 24 hours or as told by your doctor. You may use a face mask and wash your hands to stop your cold from spreading. GET HELP RIGHT AWAY IF:   After the first few days, you feel you are getting worse.  You have questions about your medicine.  You have chills, shortness of breath, or red spit (mucus).  You have pain in the face for more then 1-2 days, especially when you bend forward.  You have a fever, puffy (swollen) neck, pain when you swallow, or white spots in the back of your throat.  You have a bad headache, ear pain, sinus pain, or chest pain.  You have a high-pitched whistling sound when you breathe in and out (wheezing).  You cough up blood.  You have sore muscles or a stiff neck. MAKE SURE YOU:   Understand these instructions.  Will watch your condition.  Will get help right away if you are not doing well or get worse. Document Released: 05/23/2008 Document Revised: 02/27/2012 Document Reviewed: 03/12/2014 [facility] Patient Information 2015 [place], [place]. This information is not intended to replace advice given to you by your health care provider. Make sure you discuss any questions you have with your health care provider.      Kriste Basque R.

## 2016-04-12 ENCOUNTER — Other Ambulatory Visit (INDEPENDENT_AMBULATORY_CARE_PROVIDER_SITE_OTHER): Payer: BLUE CROSS/BLUE SHIELD

## 2016-04-12 DIAGNOSIS — I10 Essential (primary) hypertension: Secondary | ICD-10-CM | POA: Diagnosis not present

## 2016-04-12 LAB — CBC WITH DIFFERENTIAL/PLATELET
Basophils Absolute: 0 10*3/uL (ref 0.0–0.1)
Basophils Relative: 0.8 % (ref 0.0–3.0)
Eosinophils Absolute: 0.3 10*3/uL (ref 0.0–0.7)
Eosinophils Relative: 4.1 % (ref 0.0–5.0)
HCT: 42.8 % (ref 39.0–52.0)
Hemoglobin: 14.3 g/dL (ref 13.0–17.0)
Lymphocytes Relative: 24.8 % (ref 12.0–46.0)
Lymphs Abs: 1.5 10*3/uL (ref 0.7–4.0)
MCHC: 33.4 g/dL (ref 30.0–36.0)
MCV: 92.9 fl (ref 78.0–100.0)
Monocytes Absolute: 0.6 10*3/uL (ref 0.1–1.0)
Monocytes Relative: 8.9 % (ref 3.0–12.0)
Neutro Abs: 3.8 10*3/uL (ref 1.4–7.7)
Neutrophils Relative %: 61.4 % (ref 43.0–77.0)
Platelets: 252 10*3/uL (ref 150.0–400.0)
RBC: 4.61 Mil/uL (ref 4.22–5.81)
RDW: 14.2 % (ref 11.5–15.5)
WBC: 6.2 10*3/uL (ref 4.0–10.5)

## 2016-04-12 LAB — BASIC METABOLIC PANEL
BUN: 14 mg/dL (ref 6–23)
CO2: 30 mEq/L (ref 19–32)
Calcium: 9.5 mg/dL (ref 8.4–10.5)
Chloride: 105 mEq/L (ref 96–112)
Creatinine, Ser: 0.88 mg/dL (ref 0.40–1.50)
GFR: 94.73 mL/min (ref >60.00)
Glucose, Bld: 123 mg/dL — ABNORMAL HIGH (ref 70–99)
Potassium: 4.2 mEq/L (ref 3.5–5.1)
Sodium: 141 mEq/L (ref 135–145)

## 2016-04-12 LAB — POC URINALSYSI DIPSTICK (AUTOMATED)
Bilirubin, UA: NEGATIVE
Glucose, UA: NEGATIVE
Ketones, UA: NEGATIVE
Leukocytes, UA: NEGATIVE
Nitrite, UA: NEGATIVE
Spec Grav, UA: 1.02
Urobilinogen, UA: 1
pH, UA: 7

## 2016-04-12 LAB — LIPID PANEL
Cholesterol: 175 mg/dL (ref 0–200)
HDL: 29.6 mg/dL — ABNORMAL LOW (ref >39.00)
LDL Cholesterol: 113 mg/dL — ABNORMAL HIGH (ref 0–99)
NonHDL: 145.88
Total CHOL/HDL Ratio: 6
Triglycerides: 165 mg/dL — ABNORMAL HIGH (ref 0.0–149.0)
VLDL: 33 mg/dL (ref 0.0–40.0)

## 2016-04-12 LAB — PSA: PSA: 0.48 ng/mL (ref 0.10–4.00)

## 2016-04-12 LAB — HEPATIC FUNCTION PANEL
ALT: 25 U/L (ref 0–53)
AST: 20 U/L (ref 0–37)
Albumin: 3.9 g/dL (ref 3.5–5.2)
Alkaline Phosphatase: 83 U/L (ref 39–117)
Bilirubin, Direct: 0.1 mg/dL (ref 0.0–0.3)
Total Bilirubin: 0.5 mg/dL (ref 0.2–1.2)
Total Protein: 6.9 g/dL (ref 6.0–8.3)

## 2016-04-12 LAB — TSH: TSH: 1.75 u[IU]/mL (ref 0.35–4.50)

## 2016-04-18 ENCOUNTER — Encounter: Payer: Self-pay | Admitting: Family Medicine

## 2016-04-18 ENCOUNTER — Ambulatory Visit (INDEPENDENT_AMBULATORY_CARE_PROVIDER_SITE_OTHER): Payer: BLUE CROSS/BLUE SHIELD | Admitting: Family Medicine

## 2016-04-18 VITALS — BP 128/74 | HR 95 | Temp 99.1°F | Ht 70.0 in | Wt 393.0 lb

## 2016-04-18 DIAGNOSIS — R739 Hyperglycemia, unspecified: Secondary | ICD-10-CM

## 2016-04-18 DIAGNOSIS — Z0000 Encounter for general adult medical examination without abnormal findings: Secondary | ICD-10-CM

## 2016-04-18 MED ORDER — ALPRAZOLAM 1 MG PO TABS: ORAL_TABLET | ORAL | Status: DC

## 2016-04-18 MED ORDER — SILDENAFIL CITRATE 100 MG PO TABS: 100.0000 mg | ORAL_TABLET | Freq: Every day | ORAL | Status: DC | PRN

## 2016-04-18 MED ORDER — FUROSEMIDE 40 MG PO TABS: 40.0000 mg | ORAL_TABLET | Freq: Every day | ORAL | Status: DC | PRN

## 2016-04-18 NOTE — Progress Notes (Signed)
Pre visit review using our clinic review tool, if applicable. No additional management support is needed unless otherwise documented below in the visit note. 

## 2016-04-19 ENCOUNTER — Encounter: Payer: Self-pay | Admitting: Family Medicine

## 2016-04-19 NOTE — Progress Notes (Signed)
   Subjective:    Patient ID: Gregory Cortez, male    DOB: 12/01/1956, 60 y.o.   MRN: [id number]  HPI 60 yr old male for a cpx. He feels well except for chronic bilateral knee pain. 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 Lequita HaltAluisio has recommended bilateral arthroscopies. However he has asked Gregory Cortez to lose 100 lbs of weight first. 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 Once things settle down after the opening he plans to get serious about weight loss.    Review of Systems  Constitutional: Negative.   HENT: Negative.   Eyes: Negative.   Respiratory: Negative.   Cardiovascular: Negative.   Gastrointestinal: Negative.   Genitourinary: Negative.   Musculoskeletal: Positive for back pain, arthralgias and gait problem. Negative for myalgias, joint swelling, neck pain and neck stiffness.  Skin: Negative.   Neurological: Negative.   Psychiatric/Behavioral: Negative.        Objective:   Physical Exam  Constitutional: He is oriented to person, place, and time.  Morbidly obese. He has great difficulty with walking and getting up out of a chair.  HENT:  Head: Normocephalic and atraumatic.  Right Ear: External ear normal.  Left Ear: External ear normal.  Nose: Nose normal.  Mouth/Throat: Oropharynx is clear and moist. No oropharyngeal exudate.  Eyes: Conjunctivae and EOM are normal. Pupils are equal, round, and reactive to light. Right eye exhibits no discharge. Left eye exhibits no discharge. No scleral icterus.  Neck: Neck supple. No JVD present. No tracheal deviation present. No thyromegaly present.  Cardiovascular: Normal rate, regular rhythm, normal heart sounds and intact distal pulses.  Exam reveals no gallop and no friction rub.   No murmur heard. Pulmonary/Chest: Effort normal and breath sounds normal. No respiratory distress. He has no wheezes. He has no rales. He exhibits no tenderness.    Abdominal: Soft. Bowel sounds are normal. He exhibits no distension and no mass. There is no tenderness. There is no rebound and no guarding.  Genitourinary: Rectum normal, prostate normal and penis normal. Guaiac negative stool. No penile tenderness.  Musculoskeletal: Normal range of motion. He exhibits no edema or tenderness.  Lymphadenopathy:    He has no cervical adenopathy.  Neurological: He is alert and oriented to person, place, and time. He has normal reflexes. No cranial nerve deficit. He exhibits normal muscle tone. Coordination normal.  Skin: Skin is warm and dry. No rash noted. He is not diaphoretic. No erythema. No pallor.  Psychiatric: He has a normal mood and affect. His behavior is normal. Judgment and thought content normal.          Assessment & Plan:  Well exam. We discussed diet and exercise, and he will be more focused on weight loss in the next 6 months or so.  Nelwyn SalisburyFRY,Lavon Horn A, MD

## 2016-05-02 ENCOUNTER — Other Ambulatory Visit: Payer: Self-pay | Admitting: *Deleted

## 2016-05-02 MED ORDER — FOLIC ACID 1 MG PO TABS: 1.0000 mg | ORAL_TABLET | Freq: Every day | ORAL | Status: DC

## 2016-05-03 ENCOUNTER — Other Ambulatory Visit: Payer: Self-pay | Admitting: *Deleted

## 2016-05-03 MED ORDER — FOLIC ACID 1 MG PO TABS: 1.0000 mg | ORAL_TABLET | Freq: Every day | ORAL | Status: DC

## 2016-05-08 DIAGNOSIS — R7309 Other abnormal glucose: Secondary | ICD-10-CM | POA: Diagnosis not present

## 2016-05-08 DIAGNOSIS — L02619 Cutaneous abscess of unspecified foot: Secondary | ICD-10-CM | POA: Diagnosis not present

## 2016-05-08 DIAGNOSIS — B353 Tinea pedis: Secondary | ICD-10-CM | POA: Diagnosis not present

## 2016-05-08 DIAGNOSIS — B351 Tinea unguium: Secondary | ICD-10-CM | POA: Diagnosis not present

## 2016-06-23 ENCOUNTER — Other Ambulatory Visit: Payer: Self-pay | Admitting: *Deleted

## 2016-06-24 MED ORDER — TRAMADOL HCL 50 MG PO TABS: 100.0000 mg | ORAL_TABLET | Freq: Four times a day (QID) | ORAL | Status: DC | PRN

## 2016-06-24 NOTE — Telephone Encounter (Signed)
Call in #[phone] with 2 rf 

## 2016-07-24 ENCOUNTER — Emergency Department (HOSPITAL_COMMUNITY): Payer: BLUE CROSS/BLUE SHIELD

## 2016-07-24 ENCOUNTER — Encounter (HOSPITAL_COMMUNITY): Payer: Self-pay

## 2016-07-24 ENCOUNTER — Emergency Department (HOSPITAL_COMMUNITY)
Admission: EM | Admit: 2016-07-24 | Discharge: 2016-07-25 | Disposition: A | Discharge status: Home / Self Care | Payer: BLUE CROSS/BLUE SHIELD | Attending: Emergency Medicine | Admitting: Emergency Medicine

## 2016-07-24 DIAGNOSIS — R079 Chest pain, unspecified: Secondary | ICD-10-CM

## 2016-07-24 DIAGNOSIS — R0789 Other chest pain: Secondary | ICD-10-CM | POA: Diagnosis not present

## 2016-07-24 DIAGNOSIS — R0602 Shortness of breath: Secondary | ICD-10-CM | POA: Diagnosis not present

## 2016-07-24 DIAGNOSIS — Z7901 Long term (current) use of anticoagulants: Secondary | ICD-10-CM | POA: Insufficient documentation

## 2016-07-24 LAB — CBC
HCT: 43.3 % (ref 39.0–52.0)
Hemoglobin: 14.2 g/dL (ref 13.0–17.0)
MCH: 31.3 pg (ref 26.0–34.0)
MCHC: 32.8 g/dL (ref 30.0–36.0)
MCV: 95.6 fL (ref 78.0–100.0)
Platelets: 238 10*3/uL (ref 150–400)
RBC: 4.53 MIL/uL (ref 4.22–5.81)
RDW: 12.6 % (ref 11.5–15.5)
WBC: 6.5 10*3/uL (ref 4.0–10.5)

## 2016-07-24 LAB — PROTIME-INR
INR: 1.43
Prothrombin Time: 17.5 seconds — ABNORMAL HIGH (ref 11.4–15.2)

## 2016-07-24 LAB — BASIC METABOLIC PANEL
Anion gap: 9 (ref 5–15)
BUN: 8 mg/dL (ref 6–20)
CO2: 23 mmol/L (ref 22–32)
Calcium: 9.2 mg/dL (ref 8.9–10.3)
Chloride: 108 mmol/L (ref 101–111)
Creatinine, Ser: 1 mg/dL (ref 0.61–1.24)
GFR calc Af Amer: >60 mL/min (ref >60)
GFR calc non Af Amer: >60 mL/min (ref >60)
Glucose, Bld: 225 mg/dL — ABNORMAL HIGH (ref 65–99)
Potassium: 4.1 mmol/L (ref 3.5–5.1)
Sodium: 140 mmol/L (ref 135–145)

## 2016-07-24 LAB — I-STAT TROPONIN, ED: Troponin i, poc: 0 ng/mL (ref 0.00–0.08)

## 2016-07-24 LAB — BRAIN NATRIURETIC PEPTIDE: B Natriuretic Peptide: 15.2 pg/mL (ref 0.0–100.0)

## 2016-07-24 LAB — D-DIMER, QUANTITATIVE: D-Dimer, Quant: <0.27 ug/mL-FEU (ref 0.00–0.50)

## 2016-07-24 NOTE — ED Provider Notes (Signed)
[facility]-EMER[facility]ENCY DEPT Provider Note   CSN: [id number] Arrival date & time: 07/24/16  2004  First Provider Contact:  First MD Initiated Contact with Patient 07/24/16 2105        History   Chief Complaint Chief Complaint  Patient presents with  . Chest Pain    HPI Gregory Cortez is a 60 y.o. male.  Patient complains of a chest pressure, which started today, and is felt bilaterally, and persistent. No known trauma. The pressure sensation is worse with ambulation and is associated with shortness of breath, also worse with exertion. 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 He does not have cough, fever, chills, nausea or vomiting. The chest pressure reminds him of how he felt previously when he had a pulmonary embolism. He is currently compliant with his xarelto, for treatment of prior PEs. He has not used Coumadin since he was initiated on xarelto, 4 years ago. He denies abdominal pain or lower back pain. He is able to walk, without dizziness. Is been no syncope. There are no other known modifying factors.  HPI  Past Medical History:  Diagnosis Date  . Chest pain   . ED (erectile dysfunction)   . GERD (gastroesophageal reflux disease)    [facility]  . Hx of colonoscopy   . Low back pain    dr Channing Muttersroy, dr Farris Haskramer, dr Ethelene Halramos, herniated disc L4-5  . OSA (obstructive sleep apnea)    dr Shelle Ironclance  . PE (pulmonary embolism)    bilateral sep 2011 and again bilateral May 2012  . Primary hypercoagulable state (HCC)    no etiology found per Dr. Shirline FreesMohammed   . Pulmonary embolism (HCC) 02/22/2010   CT angio Dx  . Testosterone deficiency    dr Patsi Searstannenbaum, shots every 2 weeks  . Thrombosis of arm    left arm 08/2010    Patient Active Problem List   Diagnosis Date Noted  . Acute respiratory failure with hypoxia (HCC) 09/14/2015  . Hypoxia 09/13/2015  . Absolute anemia   . Other fatigue   . Chest pain 07/22/2015  . Hemorrhoids 02/25/2014  . Abscess, retroperitoneal (HCC) 09/21/2012  .  Chronic anticoagulation for h/o Pulmonary embolism 09/11/2012  . Obesity, Class III, BMI 40-49.9 (morbid obesity) (HCC) 09/11/2012  . Appendicitis with peritonitis s/p lap appy 09/13/2012 09/11/2012  . Primary hypercoagulable state (HCC)   . Anxiety state 08/31/2010  . LOW BACK PAIN 05/10/2010  . EDEMA LEG 05/10/2010  . HYPOGONADISM 02/23/2010  . Sleep apnea 02/23/2010  . GERD 02/23/2010  . ERECTILE DYSFUNCTION, ORGANIC 02/23/2010    Past Surgical History:  Procedure Laterality Date  . APPENDECTOMY    . cardiac stress test x 2     . COLONOSCOPY  08-19-09   per Dr. Evette CristalGanem, clear, repeat in 10 yrs   . esi     L4-5 dr Channing Muttersroy 03/2010  . ESOPHAGOGASTRODUODENOSCOPY     x2 - normal except reflux  . KNEE SURGERY     both  . LAPAROSCOPIC APPENDECTOMY  09/13/2012   Procedure: APPENDECTOMY LAPAROSCOPIC;  Surgeon: Almond LintFaera Byerly, MD;  Location: [facility];  Service: General;  Laterality: N/A;  . ROTATOR CUFF REPAIR  08-20-10   left dr Wyline Moodweiner complicated by PE  . SHOULDER SURGERY     right and left       Home Medications    Prior to Admission medications   Medication Sig Start Date End Date Taking? Authorizing Provider  acetaminophen (TYLENOL) 500 MG tablet Take 1,000 mg  by mouth every 4 (four) hours as needed (pain).   Yes Historical Provider, MD  ALPRAZolam Prudy Feeler) 1 MG tablet TAKE 1 TABLET BY MOUTH 3 TIMES A DAY AS NEEDED FOR ANXIETY 04/18/16  Yes Nelwyn Salisbury, MD  ALPRAZolam Prudy Feeler) 1 MG tablet Take 0.5 mg by mouth at bedtime as needed for sleep. HS   Yes Historical Provider, MD  diphenhydramine-acetaminophen (TYLENOL PM) 25-500 MG TABS Take 2 tablets by mouth at bedtime as needed (for sleep).    Yes Historical Provider, MD  docusate sodium (COLACE) 100 MG capsule Take 1 capsule (100 mg total) by mouth 2 (two) times daily. Patient taking differently: Take 100 mg by mouth 2 (two) times daily as needed (constipation).  02/25/14  Yes Nelwyn Salisbury, MD  folic acid (FOLVITE) 1 MG tablet Take 1 tablet (1  mg total) by mouth daily. 05/03/16  Yes Josph Macho, MD  furosemide (LASIX) 40 MG tablet Take 1 tablet (40 mg total) by mouth daily as needed for fluid or edema. 04/18/16  Yes Nelwyn Salisbury, MD  methocarbamol (ROBAXIN) 500 MG tablet Take 500 mg by mouth every 8 (eight) hours as needed for muscle spasms.  06/05/15  Yes Historical Provider, MD  Multiple Vitamin (MULTIVITAMIN WITH MINERALS) TABS tablet Take 1 tablet by mouth daily.   Yes Historical Provider, MD  omeprazole (PRILOSEC) 40 MG capsule TAKE 1 CAPSULE (40 MG TOTAL) BY MOUTH DAILY. 09/21/15  Yes Nelwyn Salisbury, MD  polyethylene glycol [facility] / GLYCOLAX) packet Take 17 g by mouth daily as needed. Patient taking differently: Take 17 g by mouth daily as needed (constipation).  09/18/12  Yes Sherrie George, PA-C  sildenafil (VIAGRA) 100 MG tablet Take 1 tablet (100 mg total) by mouth daily as needed for erectile dysfunction. 04/18/16  Yes Nelwyn Salisbury, MD  traMADol (ULTRAM) 50 MG tablet Take 2 tablets (100 mg total) by mouth every 6 (six) hours as needed. 06/24/16  Yes Nelwyn Salisbury, MD  XARELTO 20 MG TABS tablet TAKE 1 TABLET BY MOUTH EVERY DAY 01/26/16  Yes Nelwyn Salisbury, MD  acidophilus (RISAQUAD) CAPS capsule Take 1 capsule by mouth daily. Patient not taking: Reported on 07/24/2016 09/15/15   Joseph Art, DO  benzonatate (TESSALON PERLES) 100 MG capsule Take 1 capsule (100 mg total) by mouth 2 (two) times daily as needed for cough. Patient not taking: Reported on 04/18/2016 02/22/16   Terressa Koyanagi, DO  clindamycin (CLEOCIN) 300 MG capsule Take 1 capsule (300 mg total) by mouth 3 (three) times daily. Patient not taking: Reported on 04/18/2016 09/28/15   Shirline Frees, NP    Family History Family History  Problem Relation Age of Onset  . Hypertension Other   . Cancer Other     lung  . Coronary artery disease Neg Hx     Social History Social History  Substance Use Topics  . Smoking status: Never Smoker  . Smokeless tobacco: Never Used      Comment: never used product  . Alcohol use No     Allergies   Prednisone   Review of Systems Review of Systems  All other systems reviewed and are negative.    Physical Exam Updated Vital Signs BP 122/72   Pulse 83   Temp 98.5 F (36.9 C) (Oral)   Resp 25   Ht 5\' 10"  (1.778 m)   Wt (!) 395 lb (179.2 kg)   SpO2 94%   BMI 56.68 kg/m   Physical  Exam  Constitutional: He is oriented to person, place, and time. He appears well-developed. No distress.  Morbidly obese  HENT:  Head: Normocephalic and atraumatic.  Right Ear: External ear normal.  Left Ear: External ear normal.  Eyes: Conjunctivae and EOM are normal. Pupils are equal, round, and reactive to light.  Neck: Normal range of motion and phonation normal. Neck supple.  Cardiovascular: Normal rate, regular rhythm and normal heart sounds.   Pulmonary/Chest: Effort normal and breath sounds normal. He exhibits no bony tenderness.  Abdominal: Soft. There is no tenderness.  Musculoskeletal: Normal range of motion. He exhibits no edema or tenderness.  Neurological: He is alert and oriented to person, place, and time. No cranial nerve deficit or sensory deficit. He exhibits normal muscle tone. Coordination normal.  Skin: Skin is warm, dry and intact.  Psychiatric: He has a normal mood and affect. His behavior is normal. Judgment and thought content normal.  Nursing note and vitals reviewed.    ED Treatments / Results  Labs (all labs ordered are listed, but only abnormal results are displayed) Labs Reviewed  BASIC METABOLIC PANEL - Abnormal; Notable for the following:       Result Value   Glucose, Bld 225 (*)    All other components within normal limits  PROTIME-INR - Abnormal; Notable for the following:    Prothrombin Time 17.5 (*)    All other components within normal limits  CBC  BRAIN NATRIURETIC PEPTIDE  D-DIMER, QUANTITATIVE (NOT AT [facility])  Rosezena Sensor, ED    EKG  EKG  Interpretation  Date/Time:  Sunday July 24 2016 22:49:53 EDT Ventricular Rate:  78 PR Interval:    QRS Duration: 88 QT Interval:  375 QTC Calculation: 428 R Axis:   -24 Text Interpretation:  Sinus rhythm Borderline left axis deviation Low voltage, precordial leads Abnormal R-wave progression, late transition Since last tracing No significant change was found Confirmed by Effie Shy  MD, Yittel Emrich [phone]) on 07/24/2016 11:10:01 PM       Radiology Dg Chest 2 View  Result Date: 07/24/2016 CLINICAL DATA:  Patient with chest pressure and shortness of breath for 1 week. EXAM: CHEST  2 VIEW COMPARISON:  Chest radiograph 09/13/2015 FINDINGS: Stable cardiomegaly. Pulmonary vascular redistribution. Mild interstitial opacities bilaterally. No pleural effusion. Thoracic spine degenerative changes. IMPRESSION: Cardiomegaly and mild interstitial edema. Electronically Signed   By: Annia Belt M.D.   On: 07/24/2016 21:30    Procedures Procedures (including critical care time)  Medications Ordered in ED Medications - No data to display   Initial Impression / Assessment and Plan / ED Course  I have reviewed the triage vital signs and the nursing notes.  Pertinent labs & imaging results that were available during my care of the patient were reviewed by me and considered in my medical decision making (see chart for details).  Clinical Course  Value Comment By Time  ED EKG within 10 minutes (Reviewed) Mancel Bale, MD 08/06 2201  DG Chest 2 View (Reviewed) Mancel Bale, MD 08/06 2202  DG Chest 2 View (Reviewed) Mancel Bale, MD 08/06 2203    Medications - No data to display  Patient Vitals for the past 24 hrs:  BP Temp Temp src Pulse Resp SpO2 Height Weight  07/24/16 2300 122/72 - - 83 25 94 % - -  07/24/16 2100 143/81 - - 88 25 94 % - -  07/24/16 2009 150/86 98.5 F (36.9 C) Oral 89 20 94 % 5\' 10"  (1.778 m) (!) 395 lb (  179.2 kg)    11:35 PM Reevaluation with update and discussion. After  initial assessment and treatment, an updated evaluation reveals No further complaints. Findings discussed with patient and all questions answered. Zanobia Griebel L    Final Clinical Impressions(s) / ED Diagnoses   Final diagnoses:  Nonspecific chest pain  \   Nonspecific chest pain, with dyspnea. No evidence for CHF, PE, pneumonia, or metabolic instability. No clear cause for discomfort.  Nursing Notes Reviewed/ Care Coordinated Applicable Imaging Reviewed Interpretation of Laboratory Data incorporated into ED treatment  The patient appears reasonably screened and/or stabilized for discharge and I doubt any other medical condition or other [facility] requiring further screening, evaluation, or treatment in the ED at this time prior to discharge.  Plan: Home Medications- continue; Home Treatments- rest; return here if the recommended treatment, does not improve the symptoms; Recommended follow up- PCP prn       New Prescriptions New Prescriptions   No medications on file     Mancel Bale, MD 07/24/16 2337

## 2016-07-24 NOTE — ED Notes (Signed)
Approached by Effie ShyWentz MD, stating he was unable to view pt's ECG. Pt reports ECG done earlier during current visit, but initial ECG has not crossed over as yet. Repeated ECG and [facility] to [facility].

## 2016-07-24 NOTE — ED Notes (Signed)
Pt returned from xray

## 2016-07-24 NOTE — ED Triage Notes (Signed)
Pt states he started having chest pain on yesterday which feel like pressure when he walks; Pt states he has pain in rib cage and back; Pt states he has hx of DVT last year of which he takes blood thinners for; pt states feel the same as when he had DVT; Pt states pain at 10/10 on arrival with some SOB; Pt a&ox 4 on arrival.

## 2016-07-24 NOTE — ED Notes (Signed)
PT to xray at this time.

## 2016-08-15 ENCOUNTER — Other Ambulatory Visit: Payer: Self-pay | Admitting: Family Medicine

## 2016-11-26 DIAGNOSIS — R631 Polydipsia: Secondary | ICD-10-CM | POA: Diagnosis not present

## 2016-11-26 DIAGNOSIS — R079 Chest pain, unspecified: Secondary | ICD-10-CM | POA: Diagnosis not present

## 2016-11-26 DIAGNOSIS — R739 Hyperglycemia, unspecified: Secondary | ICD-10-CM | POA: Diagnosis not present

## 2016-11-26 DIAGNOSIS — E119 Type 2 diabetes mellitus without complications: Secondary | ICD-10-CM | POA: Diagnosis not present

## 2016-11-28 ENCOUNTER — Telehealth: Payer: Self-pay | Admitting: Family Medicine

## 2016-11-28 ENCOUNTER — Ambulatory Visit: Payer: Self-pay | Admitting: Family Medicine

## 2016-11-28 NOTE — Telephone Encounter (Signed)
FYI. Pt coming in today to see Dr. Selena BattenKim

## 2016-11-28 NOTE — Progress Notes (Deleted)
HPI:  Gregory Cortez is a pleasant 60 yo with a PMH significant for morbid obesity, mildly elevated BS in the past and an unhealthy lifestyle here for an acute visit for elevated blood sugar. [facility] and had elevated blood sugars in the 400s and was started on metformin. BS has improved a little daily - but still high. Reports. Denies.    ROS: See pertinent positives and negatives per HPI.  Past Medical History:  Diagnosis Date  . Chest pain   . ED (erectile dysfunction)   . GERD (gastroesophageal reflux disease)    [facility]  . Hx of colonoscopy   . Low back pain    dr Gregory Cortez, dr Gregory Cortez, dr Gregory Cortez, herniated disc L4-5  . OSA (obstructive sleep apnea)    dr Gregory Cortez  . PE (pulmonary embolism)    bilateral sep 2011 and again bilateral May 2012  . Primary hypercoagulable state (HCC)    no etiology found per Dr. Shirline Cortez   . Pulmonary embolism (HCC) 02/22/2010   CT angio Dx  . Testosterone deficiency    dr Gregory Cortez, shots every 2 weeks  . Thrombosis of arm    left arm 08/2010    Past Surgical History:  Procedure Laterality Date  . APPENDECTOMY    . cardiac stress test x 2     . COLONOSCOPY  08-19-09   per Dr. Evette Cortez, clear, repeat in 10 yrs   . esi     L4-5 dr Gregory Cortez 03/2010  . ESOPHAGOGASTRODUODENOSCOPY     x2 - normal except reflux  . KNEE SURGERY     both  . LAPAROSCOPIC APPENDECTOMY  09/13/2012   Procedure: APPENDECTOMY LAPAROSCOPIC;  Surgeon: Gregory LintFaera Byerly, MD;  Location: [facility];  Service: General;  Laterality: N/A;  . ROTATOR CUFF REPAIR  08-20-10   left dr Gregory Cortez complicated by PE  . SHOULDER SURGERY     right and left    Family History  Problem Relation Age of Onset  . Hypertension Other   . Cancer Other     lung  . Coronary artery disease Neg Hx     Social History   Social History  . Marital status: Married    Spouse name: N/A  . Number of children: 3  . Years of education: N/A   Occupational History  . Desk  job-petroleum dispatcher Gregory Cortez   Social History Main Topics  . Smoking status: Never Smoker  . Smokeless tobacco: Never Used     Comment: never used product  . Alcohol use No  . Drug use: No  . Sexual activity: Not on file   Other Topics Concern  . Not on file   Social History Narrative  . No narrative on file     Current Outpatient Prescriptions:  .  acetaminophen (TYLENOL) 500 MG tablet, Take 1,000 mg by mouth every 4 (four) hours as needed (pain)., Disp: , Rfl:  .  acidophilus (RISAQUAD) CAPS capsule, Take 1 capsule by mouth daily. (Patient not taking: Reported on 07/24/2016), Disp: 10 capsule, Rfl: 0 .  ALPRAZolam (XANAX) 1 MG tablet, TAKE 1 TABLET BY MOUTH 3 TIMES A DAY AS NEEDED FOR ANXIETY, Disp: 90 tablet, Rfl: 5 .  ALPRAZolam (XANAX) 1 MG tablet, Take 0.5 mg by mouth at bedtime as needed for sleep. HS, Disp: , Rfl:  .  benzonatate (TESSALON PERLES) 100 MG capsule, Take 1 capsule (100 mg total) by mouth 2 (two) times  daily as needed for cough. (Patient not taking: Reported on 04/18/2016), Disp: 20 capsule, Rfl: 0 .  clindamycin (CLEOCIN) 300 MG capsule, Take 1 capsule (300 mg total) by mouth 3 (three) times daily. (Patient not taking: Reported on 04/18/2016), Disp: 30 capsule, Rfl: 0 .  diphenhydramine-acetaminophen (TYLENOL PM) 25-500 MG TABS, Take 2 tablets by mouth at bedtime as needed (for sleep). , Disp: , Rfl:  .  docusate sodium (COLACE) 100 MG capsule, Take 1 capsule (100 mg total) by mouth 2 (two) times daily. (Patient taking differently: Take 100 mg by mouth 2 (two) times daily as needed (constipation). ), Disp: 2 capsule, Rfl: 0 .  folic acid (FOLVITE) 1 MG tablet, Take 1 tablet (1 mg total) by mouth daily., Disp: 30 tablet, Rfl: 11 .  furosemide (LASIX) 40 MG tablet, Take 1 tablet (40 mg total) by mouth daily as needed for fluid or edema., Disp: 90 tablet, Rfl: 3 .  methocarbamol (ROBAXIN) 500 MG tablet, Take 500 mg by mouth every 8 (eight) hours as needed for  muscle spasms. , Disp: , Rfl: 0 .  Multiple Vitamin (MULTIVITAMIN WITH MINERALS) TABS tablet, Take 1 tablet by mouth daily., Disp: , Rfl:  .  omeprazole (PRILOSEC) 40 MG capsule, TAKE 1 TABLET BY MOUTH DAILY, Disp: 30 capsule, Rfl: 10 .  polyethylene glycol (MIRALAX / GLYCOLAX) packet, Take 17 g by mouth daily as needed. (Patient taking differently: Take 17 g by mouth daily as needed (constipation). ), Disp: 1 each, Rfl:  .  sildenafil (VIAGRA) 100 MG tablet, Take 1 tablet (100 mg total) by mouth daily as needed for erectile dysfunction., Disp: 30 tablet, Rfl: 3 .  traMADol (ULTRAM) 50 MG tablet, Take 2 tablets (100 mg total) by mouth every 6 (six) hours as needed., Disp: 240 tablet, Rfl: 2 .  XARELTO 20 MG TABS tablet, TAKE 1 TABLET BY MOUTH EVERY DAY, Disp: 30 tablet, Rfl: 11  EXAM:  There were no vitals filed for this visit.  There is no height or weight on file to calculate BMI.  GENERAL: vitals reviewed and listed above, alert, oriented, appears well hydrated and in no acute distress  HEENT: atraumatic, conjunttiva clear, no obvious abnormalities on inspection of external nose and ears  NECK: no obvious masses on inspection  LUNGS: clear to auscultation bilaterally, no wheezes, rales or rhonchi, good air movement  CV: HRRR, no peripheral edema  MS: moves all extremities without noticeable abnormality  PSYCH: pleasant and cooperative, no obvious depression or anxiety  ASSESSMENT AND PLAN:  Discussed the following assessment and plan:  No diagnosis found.  -Patient advised to return or notify a doctor immediately if symptoms worsen or persist or new concerns arise.  There are no Patient Instructions on file for this visit.  Gregory Cortez, Gregory R., DO

## 2016-11-28 NOTE — Telephone Encounter (Signed)
Patient Name: Gregory Cortez  DOB: 01-10-1956    Initial Comment Caller says, went to UC on Sat, for dry mouth, blood glucose was 450, placed on Metformin. Sunday it was 344, and today is 327. He would like an appt asap.    Nurse Assessment  Nurse: Deatra JamesNoe, RN, [name] Date/Time [name](Eastern Time): 11/28/2016 10:40:35 AM  Confirm and document reason for call. If symptomatic, describe symptoms. ---Patient says, went to UC on Sat, for dry mouth, blood glucose was 450, placed on Metformin. Sunday it was 344, and today is 327. He would like an appt asap.  Does the patient have any new or worsening symptoms? ---Yes  Will a triage be completed? ---Yes  Related visit to physician within the last 2 weeks? ---No  Does the PT have any chronic conditions? (i.e. diabetes, asthma, etc.) ---Yes  List chronic conditions. ---"HTN, hx PE"  Is this a behavioral health or substance abuse call? ---No     Guidelines    Guideline Title Affirmed Question Affirmed Notes  Diabetes - High Blood Sugar [1] Blood glucose > 300 mg/dl (16.116.5 mmol/l) AND [0][2] two or more times in a row    Final Disposition User   Call PCP Now Deatra JamesNoe, RN, Mary    Referrals  REFERRED TO PCP OFFICE   Disagree/Comply: Comply   Office notified he has had BS over 300 two times in a row

## 2016-11-29 ENCOUNTER — Ambulatory Visit (INDEPENDENT_AMBULATORY_CARE_PROVIDER_SITE_OTHER): Payer: BLUE CROSS/BLUE SHIELD | Admitting: Family Medicine

## 2016-11-29 ENCOUNTER — Encounter: Payer: Self-pay | Admitting: Family Medicine

## 2016-11-29 VITALS — BP 120/70 | Temp 98.6°F | Ht 70.0 in | Wt 386.0 lb

## 2016-11-29 DIAGNOSIS — E669 Obesity, unspecified: Secondary | ICD-10-CM

## 2016-11-29 DIAGNOSIS — E1169 Type 2 diabetes mellitus with other specified complication: Secondary | ICD-10-CM | POA: Diagnosis not present

## 2016-11-29 HISTORY — DX: Type 2 diabetes mellitus with other specified complication: E11.69

## 2016-11-29 HISTORY — DX: Type 2 diabetes mellitus with other specified complication: E66.9

## 2016-11-29 MED ORDER — METFORMIN HCL 500 MG PO TABS: 500.0000 mg | ORAL_TABLET | Freq: Two times a day (BID) | ORAL | 5 refills | Status: DC

## 2016-11-29 NOTE — Progress Notes (Signed)
Pre visit review using our clinic review tool, if applicable. No additional management support is needed unless otherwise documented below in the visit note. 

## 2016-11-29 NOTE — Progress Notes (Signed)
   Subjective:    Patient ID: Gregory Cortez, male    DOB: November 04, 1956, 60 y.o.   MRN: [id number]  HPI Here to follow up on recently diagnosed type 2 diabetes. He had been feeling very thirsty for a few weeks and he checked his blood glucose last week on his mother's glucometer and it was 450. He went to a [facility] over the weekend and was started on Metformin 500 mg once a day. His A1c there was 11.0. He had a normal renal function in August. His BP has been stable. Since then he has made major changes in his diet and he as eliminated almost all sugars and carbs. He feel fine today. His glcuoses at home in the past 2 days have ranged from 280 to 320.    Review of Systems  Constitutional: Negative.   Respiratory: Negative.   Cardiovascular: Negative.   Endocrine: Positive for polydipsia and polyuria.  Neurological: Negative.        Objective:   Physical Exam  Constitutional: He is oriented to person, place, and time. No distress.  Obese   Cardiovascular: Normal rate, regular rhythm, normal heart sounds and intact distal pulses.   Pulmonary/Chest: Effort normal and breath sounds normal.  Musculoskeletal: He exhibits no edema.  Neurological: He is alert and oriented to person, place, and time.          Assessment & Plan:  He has newly diagnosed type 2 diabetes. We will increase the metformin to 500 mg bid. He will follow the glucoses at home. Recheck here in 2 weeks. Gregory Cortez,Gregory Cortez A, MD

## 2017-01-16 ENCOUNTER — Other Ambulatory Visit: Payer: Self-pay | Admitting: Family Medicine

## 2017-01-27 DIAGNOSIS — M1712 Unilateral primary osteoarthritis, left knee: Secondary | ICD-10-CM | POA: Diagnosis not present

## 2017-01-27 DIAGNOSIS — M1711 Unilateral primary osteoarthritis, right knee: Secondary | ICD-10-CM | POA: Diagnosis not present

## 2017-01-27 DIAGNOSIS — M170 Bilateral primary osteoarthritis of knee: Secondary | ICD-10-CM | POA: Diagnosis not present

## 2017-02-10 DIAGNOSIS — M1712 Unilateral primary osteoarthritis, left knee: Secondary | ICD-10-CM | POA: Diagnosis not present

## 2017-03-03 ENCOUNTER — Other Ambulatory Visit: Payer: Self-pay | Admitting: Family Medicine

## 2017-03-06 NOTE — Telephone Encounter (Signed)
Call in #[phone] with one rf 

## 2017-03-23 DIAGNOSIS — M170 Bilateral primary osteoarthritis of knee: Secondary | ICD-10-CM | POA: Diagnosis not present

## 2017-04-10 ENCOUNTER — Ambulatory Visit (INDEPENDENT_AMBULATORY_CARE_PROVIDER_SITE_OTHER): Payer: BLUE CROSS/BLUE SHIELD | Admitting: Family Medicine

## 2017-04-10 ENCOUNTER — Encounter: Payer: Self-pay | Admitting: Family Medicine

## 2017-04-10 VITALS — BP 120/70 | HR 76 | Temp 98.2°F | Wt 382.4 lb

## 2017-04-10 DIAGNOSIS — H00015 Hordeolum externum left lower eyelid: Secondary | ICD-10-CM

## 2017-04-10 MED ORDER — DOXYCYCLINE HYCLATE 100 MG PO CAPS: 100.0000 mg | ORAL_CAPSULE | Freq: Two times a day (BID) | ORAL | 0 refills | Status: AC

## 2017-04-10 NOTE — Progress Notes (Signed)
   Subjective:    Patient ID: Gregory Cortez, male    DOB: December 03, 1956, 61 y.o.   MRN: [id number]  HPI Here for one week of a painful stye on the left lower eyelid. Hot compresses have not helped. He also asks about bariatric surgery. He has been unable to lose any weight. 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    Review of Systems  Constitutional: Negative.   Eyes: Negative for pain, discharge, redness, itching and visual disturbance.  Respiratory: Negative.   Cardiovascular: Negative.   Musculoskeletal: Positive for arthralgias and gait problem.       Objective:   Physical Exam  Constitutional: He is oriented to person, place, and time.  limping  Eyes: Conjunctivae and EOM are normal. Pupils are equal, round, and reactive to light.  Left lower eyelid has a large tender stye  Neck: No thyromegaly present.  Cardiovascular: Normal rate, regular rhythm, normal heart sounds and intact distal pulses.   Pulmonary/Chest: Effort normal and breath sounds normal.  Lymphadenopathy:    He has no cervical adenopathy.  Neurological: He is alert and oriented to person, place, and time.          Assessment & Plan:  For the stye we will start him on Doxycycline for 10 days. He will contract [place] if this is not any better in 2-3 days. If not he may need referral to Ophthalmology to lance it. As for the obesity, we refer him for possible bariatric  surgery. [name], MD

## 2017-04-10 NOTE — Progress Notes (Signed)
Pre visit review using our clinic review tool, if applicable. No additional management support is needed unless otherwise documented below in the visit note. 

## 2017-04-10 NOTE — Patient Instructions (Signed)
WE NOW OFFER   [facility] FAST TRACK!!!  SAME DAY Appointments for ACUTE CARE  Such as: Sprains, Injuries, cuts, abrasions, rashes, muscle pain, joint pain, back pain Colds, flu, sore throats, headache, allergies, cough, fever  Ear pain, sinus and eye infections Abdominal pain, nausea, vomiting, diarrhea, upset stomach Animal/insect bites  3 Easy Ways to Schedule: Walk-In Scheduling Call in scheduling Mychart Sign-up: [email or web address]         

## 2017-04-19 ENCOUNTER — Other Ambulatory Visit: Payer: Self-pay | Admitting: Family Medicine

## 2017-05-01 ENCOUNTER — Other Ambulatory Visit: Payer: Self-pay | Admitting: Family Medicine

## 2017-05-18 ENCOUNTER — Other Ambulatory Visit: Payer: Self-pay | Admitting: Hematology & Oncology

## 2017-06-08 ENCOUNTER — Encounter (INDEPENDENT_AMBULATORY_CARE_PROVIDER_SITE_OTHER): Payer: Self-pay | Admitting: Family Medicine

## 2017-07-12 ENCOUNTER — Other Ambulatory Visit: Payer: Self-pay | Admitting: Family Medicine

## 2017-07-12 NOTE — Telephone Encounter (Signed)
Call in #30 with 5 rf 

## 2017-07-13 NOTE — Telephone Encounter (Signed)
Per Dr. Clent RidgesFry okay to change to # 90 with 5 refills.

## 2017-08-05 ENCOUNTER — Other Ambulatory Visit: Payer: Self-pay | Admitting: Family Medicine

## 2017-09-07 ENCOUNTER — Encounter: Payer: Self-pay | Admitting: Family Medicine

## 2017-09-22 ENCOUNTER — Other Ambulatory Visit: Payer: Self-pay | Admitting: Family Medicine

## 2017-09-26 NOTE — Telephone Encounter (Signed)
Call in #[phone] with one rf 

## 2018-02-22 ENCOUNTER — Other Ambulatory Visit: Payer: Self-pay | Admitting: Family Medicine

## 2018-05-19 ENCOUNTER — Other Ambulatory Visit: Payer: Self-pay | Admitting: Family Medicine

## 2018-05-21 NOTE — Telephone Encounter (Signed)
Last refill pt will need an office visit for more refills. Thanks

## 2018-05-21 NOTE — Telephone Encounter (Signed)
Call in #30 only. He needs an OV soon  

## 2018-05-21 NOTE — Telephone Encounter (Signed)
Last refilled 05/02/2017 disp 90 with 3 refills   Last OV 04/10/2017   Sent to PCP to advise

## 2018-05-25 ENCOUNTER — Other Ambulatory Visit: Payer: Self-pay | Admitting: Family Medicine

## 2018-05-25 NOTE — Telephone Encounter (Signed)
Will refill 1 month supply pt is due for an office visit for more refills

## 2018-05-29 ENCOUNTER — Telehealth: Payer: Self-pay

## 2018-05-29 MED ORDER — RIVAROXABAN 20 MG PO TABS: 20.0000 mg | ORAL_TABLET | Freq: Every day | ORAL | 0 refills | Status: DC

## 2018-05-29 NOTE — Telephone Encounter (Signed)
90 day Rx has been sent into pt's pharmacy. Called pt to advise. Pt advised and voiced understanding.

## 2018-05-29 NOTE — Telephone Encounter (Signed)
Call in #90 with no refills. He needs an OV soon  

## 2018-05-29 NOTE — Telephone Encounter (Signed)
90 day refill request   [facility]   RF xarelto 20 MG   Last refilled 05/21/2018 disp 30 with no refills   Sent to PCP to advise   Last OV 04/10/2017

## 2018-05-29 NOTE — Telephone Encounter (Signed)
Pt stated that he will call back to schedule for an appt.

## 2018-06-27 ENCOUNTER — Other Ambulatory Visit: Payer: Self-pay | Admitting: Family Medicine

## 2018-07-01 ENCOUNTER — Other Ambulatory Visit: Payer: Self-pay | Admitting: Hematology & Oncology

## 2018-07-02 ENCOUNTER — Ambulatory Visit (INDEPENDENT_AMBULATORY_CARE_PROVIDER_SITE_OTHER): Payer: 59 | Admitting: Family Medicine

## 2018-07-02 ENCOUNTER — Encounter: Payer: Self-pay | Admitting: Family Medicine

## 2018-07-02 VITALS — BP 126/90 | HR 99 | Temp 97.9°F | Ht 70.0 in | Wt 390.6 lb

## 2018-07-02 DIAGNOSIS — G4730 Sleep apnea, unspecified: Secondary | ICD-10-CM | POA: Diagnosis not present

## 2018-07-02 DIAGNOSIS — S93401A Sprain of unspecified ligament of right ankle, initial encounter: Secondary | ICD-10-CM | POA: Diagnosis not present

## 2018-07-02 DIAGNOSIS — E66813 Obesity, class 3: Secondary | ICD-10-CM

## 2018-07-02 MED ORDER — OMEPRAZOLE 40 MG PO CPDR: 40.0000 mg | DELAYED_RELEASE_CAPSULE | Freq: Every day | ORAL | 3 refills | Status: DC

## 2018-07-02 NOTE — Addendum Note (Signed)
Addended by: Gershon CraneFRY, Srah Ake A on: 07/02/2018 09:37 AM   Modules accepted: Orders

## 2018-07-02 NOTE — Progress Notes (Signed)
   Subjective:    Patient ID: Gregory Cortez, male    DOB: April 13, 1956, 62 y.o.   MRN: [id number]  HPI Here for several issues. First he has had pain in the right ankle for the past week. No injury that he knows of. Also he asks to see someone about his CPAP machine. He used to see Dr. Shelle Ironlance for this. Lastly he asks to speak to someone about a possible lap band procedure. He has tried for years to lose weight and he cannot do so. He knows his weight is a health risk and all his joints hurt every day from carrying it around.    Review of Systems  Constitutional: Negative.   Respiratory: Negative.   Cardiovascular: Negative.   Musculoskeletal: Positive for arthralgias.       Objective:   Physical Exam  Constitutional:  Morbidly obese, limping   Cardiovascular: Normal rate, regular rhythm, normal heart sounds and intact distal pulses.  Pulmonary/Chest: Effort normal and breath sounds normal.  Musculoskeletal:  The right lateral ankle is tender just inferior to the lateral malleolus . Full ROM. No erythema or warmth           Assessment & Plan:  He has ankle sprain, probably a roll over injury. Use ice and wear a brace. We will refer him to Pulmonary to manage the sleep apnea. Refer to Bariatric Surgery.  Gershon CraneStephen Fry, MD

## 2018-07-03 ENCOUNTER — Encounter: Payer: BLUE CROSS/BLUE SHIELD | Admitting: Family Medicine

## 2018-07-04 ENCOUNTER — Ambulatory Visit (INDEPENDENT_AMBULATORY_CARE_PROVIDER_SITE_OTHER): Payer: 59 | Admitting: Family Medicine

## 2018-07-04 ENCOUNTER — Encounter: Payer: Self-pay | Admitting: Family Medicine

## 2018-07-04 VITALS — BP 128/80 | HR 99 | Temp 98.2°F | Ht 69.0 in | Wt 385.4 lb

## 2018-07-04 DIAGNOSIS — Z5181 Encounter for therapeutic drug level monitoring: Secondary | ICD-10-CM | POA: Diagnosis not present

## 2018-07-04 DIAGNOSIS — E669 Obesity, unspecified: Secondary | ICD-10-CM

## 2018-07-04 DIAGNOSIS — E1169 Type 2 diabetes mellitus with other specified complication: Secondary | ICD-10-CM | POA: Diagnosis not present

## 2018-07-04 DIAGNOSIS — Z0000 Encounter for general adult medical examination without abnormal findings: Secondary | ICD-10-CM

## 2018-07-04 LAB — POCT GLYCOSYLATED HEMOGLOBIN (HGB A1C): Hemoglobin A1C: 6 % — AB (ref 4.0–5.6)

## 2018-07-04 LAB — POC URINALSYSI DIPSTICK (AUTOMATED)
Bilirubin, UA: NEGATIVE
Blood, UA: NEGATIVE
Glucose, UA: NEGATIVE
Ketones, UA: NEGATIVE
Leukocytes, UA: NEGATIVE
Nitrite, UA: NEGATIVE
Protein, UA: NEGATIVE
Spec Grav, UA: 1.02 (ref 1.010–1.025)
Urobilinogen, UA: 1 E.U./dL
pH, UA: 6 (ref 5.0–8.0)

## 2018-07-04 MED ORDER — HYDROCODONE-ACETAMINOPHEN 10-325 MG PO TABS: 1.0000 | ORAL_TABLET | Freq: Four times a day (QID) | ORAL | 0 refills | Status: AC | PRN

## 2018-07-04 NOTE — Progress Notes (Signed)
   Subjective:    Patient ID: Gregory Cortez, male    DOB: 1956/09/20, 62 y.o.   MRN: [id number]  HPI Here for a well exam. He has a few issues to discuss. We saw him a few days ago about his sleep apnea, and we put in a referral to Pulmonary to evaluate his CPAP machine. We had also discussed his obesity and at her request we put in a referral to Bariatric Surgery to discuss his options. His main complaints today are about joint and back pain, and he knows his weight is the main contributor to these. He has found it impossible to lose weight and to keep it off. The main pain that bothers him is the right knee. He has seen Dr. Lequita HaltAluisio for this and he has tried injections of both steroids and of gels, but nothing has worked. He needs a TKR surgery, but Dr. Lequita HaltAluisio has told him that he cannot do this surgery until he loses a significant amount of weight first. He cannot take NSAIDs for pain because he is on Xarelto. Tylenol and even Tramadol do not help at all.    Review of Systems  Constitutional: Negative.   HENT: Negative.   Eyes: Negative.   Respiratory: Negative.   Cardiovascular: Negative.   Gastrointestinal: Negative.   Genitourinary: Negative.   Musculoskeletal: Positive for arthralgias and back pain.  Skin: Negative.   Neurological: Negative.   Psychiatric/Behavioral: Negative.        Objective:   Physical Exam  Constitutional: He is oriented to person, place, and time. No distress.  Morbidly obese , walks with a limp   HENT:  Head: Normocephalic and atraumatic.  Right Ear: External ear normal.  Left Ear: External ear normal.  Nose: Nose normal.  Mouth/Throat: Oropharynx is clear and moist. No oropharyngeal exudate.  Eyes: Pupils are equal, round, and reactive to light. Conjunctivae and EOM are normal. Right eye exhibits no discharge. Left eye exhibits no discharge. No scleral icterus.  Neck: Neck supple. No JVD present. No tracheal deviation present. No thyromegaly present.    Cardiovascular: Normal rate, regular rhythm, normal heart sounds and intact distal pulses. Exam reveals no gallop and no friction rub.  No murmur heard. Pulmonary/Chest: Effort normal and breath sounds normal. No respiratory distress. He has no wheezes. He has no rales. He exhibits no tenderness.  Abdominal: Soft. Bowel sounds are normal. He exhibits no distension and no mass. There is no tenderness. There is no rebound and no guarding.  Genitourinary: Rectum normal, prostate normal and penis normal. Rectal exam shows guaiac negative stool. No penile tenderness.  Musculoskeletal: Normal range of motion. He exhibits no edema or tenderness.  Lymphadenopathy:    He has no cervical adenopathy.  Neurological: He is alert and oriented to person, place, and time. He has normal reflexes. He displays normal reflexes. No cranial nerve deficit. He exhibits normal muscle tone. Coordination normal.  Skin: Skin is warm and dry. No rash noted. He is not diaphoretic. No erythema. No pallor.  Psychiatric: He has a normal mood and affect. His behavior is normal. Judgment and thought content normal.          Assessment & Plan:  Well exam. We discussed diet and exercise. Get fasting labs. He will try Norco for the pain. We will start him on the pain management protocol.  Gershon CraneStephen Shaniqwa Horsman, MD

## 2018-07-05 LAB — CBC WITH DIFFERENTIAL/PLATELET
Basophils Absolute: 0.1 10*3/uL (ref 0.0–0.1)
Basophils Relative: 1.2 % (ref 0.0–3.0)
Eosinophils Absolute: 0.2 10*3/uL (ref 0.0–0.7)
Eosinophils Relative: 3 % (ref 0.0–5.0)
HCT: 43.7 % (ref 39.0–52.0)
Hemoglobin: 14.5 g/dL (ref 13.0–17.0)
Lymphocytes Relative: 18.7 % (ref 12.0–46.0)
Lymphs Abs: 1.3 10*3/uL (ref 0.7–4.0)
MCHC: 33.3 g/dL (ref 30.0–36.0)
MCV: 94.7 fl (ref 78.0–100.0)
Monocytes Absolute: 0.6 10*3/uL (ref 0.1–1.0)
Monocytes Relative: 8.3 % (ref 3.0–12.0)
Neutro Abs: 4.9 10*3/uL (ref 1.4–7.7)
Neutrophils Relative %: 68.8 % (ref 43.0–77.0)
Platelets: 253 10*3/uL (ref 150.0–400.0)
RBC: 4.61 Mil/uL (ref 4.22–5.81)
RDW: 13 % (ref 11.5–15.5)
WBC: 7.1 10*3/uL (ref 4.0–10.5)

## 2018-07-05 LAB — LIPID PANEL
Cholesterol: 159 mg/dL (ref 0–200)
HDL: 28.2 mg/dL — ABNORMAL LOW (ref >39.00)
LDL Cholesterol: 105 mg/dL — ABNORMAL HIGH (ref 0–99)
NonHDL: 130.76
Total CHOL/HDL Ratio: 6
Triglycerides: 131 mg/dL (ref 0.0–149.0)
VLDL: 26.2 mg/dL (ref 0.0–40.0)

## 2018-07-05 LAB — PAIN MGMT, PROFILE 8 W/CONF, U
6 Acetylmorphine: NEGATIVE ng/mL (ref <10)
Alcohol Metabolites: NEGATIVE ng/mL (ref <500)
Amphetamines: NEGATIVE ng/mL (ref <500)
Benzodiazepines: NEGATIVE ng/mL (ref <100)
Buprenorphine, Urine: NEGATIVE ng/mL (ref <5)
Cocaine Metabolite: NEGATIVE ng/mL (ref <150)
Creatinine: 169.1 mg/dL
MDMA: NEGATIVE ng/mL (ref <500)
Marijuana Metabolite: NEGATIVE ng/mL (ref <20)
Opiates: NEGATIVE ng/mL (ref <100)
Oxidant: NEGATIVE ug/mL (ref <200)
Oxycodone: NEGATIVE ng/mL (ref <100)
pH: 6.36 (ref 4.5–9.0)

## 2018-07-05 LAB — HEPATIC FUNCTION PANEL
ALT: 27 U/L (ref 0–53)
AST: 21 U/L (ref 0–37)
Albumin: 4.1 g/dL (ref 3.5–5.2)
Alkaline Phosphatase: 80 U/L (ref 39–117)
Bilirubin, Direct: 0.1 mg/dL (ref 0.0–0.3)
Total Bilirubin: 0.7 mg/dL (ref 0.2–1.2)
Total Protein: 7.3 g/dL (ref 6.0–8.3)

## 2018-07-05 LAB — PSA: PSA: 0.74 ng/mL (ref 0.10–4.00)

## 2018-07-05 LAB — BASIC METABOLIC PANEL
BUN: 12 mg/dL (ref 6–23)
CO2: 31 mEq/L (ref 19–32)
Calcium: 9.4 mg/dL (ref 8.4–10.5)
Chloride: 104 mEq/L (ref 96–112)
Creatinine, Ser: 0.85 mg/dL (ref 0.40–1.50)
GFR: 97.84 mL/min (ref >60.00)
Glucose, Bld: 107 mg/dL — ABNORMAL HIGH (ref 70–99)
Potassium: 4.2 mEq/L (ref 3.5–5.1)
Sodium: 142 mEq/L (ref 135–145)

## 2018-07-05 LAB — TSH: TSH: 1.31 u[IU]/mL (ref 0.35–4.50)

## 2018-07-06 ENCOUNTER — Telehealth: Payer: Self-pay

## 2018-07-06 NOTE — Telephone Encounter (Signed)
Noted  

## 2018-07-06 NOTE — Telephone Encounter (Signed)
FYI   Fax from [facility]   Notes from pharmacist stated the following:    Insurance would only fill a 7 day supply for the Nocro pt might request a refill earlier than expected just an [place]

## 2018-07-26 ENCOUNTER — Ambulatory Visit (INDEPENDENT_AMBULATORY_CARE_PROVIDER_SITE_OTHER): Payer: 59 | Admitting: Pulmonary Disease

## 2018-07-26 ENCOUNTER — Encounter: Payer: Self-pay | Admitting: Pulmonary Disease

## 2018-07-26 VITALS — BP 118/78 | HR 78 | Ht 70.5 in | Wt 394.0 lb

## 2018-07-26 DIAGNOSIS — G4730 Sleep apnea, unspecified: Secondary | ICD-10-CM | POA: Diagnosis not present

## 2018-07-26 DIAGNOSIS — Z7901 Long term (current) use of anticoagulants: Secondary | ICD-10-CM

## 2018-07-26 NOTE — Progress Notes (Signed)
Gregory Cortez    [id number]    [birth year]  Primary Care Physician:Fry, Tera Mater, MD  Referring Physician: Nelwyn Salisbury, MD [address] [place], [place] [address]  Chief complaint:   History of obstructive sleep apnea, on CPAP therapy Dysfunctional machine  HPI:  He is to see Dr. 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 acetaminophen (TYLENOL) 500 MG tablet Take 1,000 mg by mouth every 4 (four) hours as needed (pain).  [name] Kitchen acidophilus (RISAQUAD) CAPS capsule Take 1 capsule by mouth daily.  [name] Kitchen ALPRAZolam (XANAX) 1 MG tablet TAKE 1 TABLET BY MOUTH THREE TIMES DAILY AS NEEDED FOR ANXIETY  . diphenhydramine-acetaminophen (TYLENOL PM) 25-500 MG TABS Take 2 tablets by mouth at bedtime as needed (for sleep).   . docusate sodium (COLACE) 100 MG capsule Take 1 capsule (100 mg total) by mouth 2 (two) times daily. (Patient taking differently: Take 100 mg by mouth 2 (two) times daily as needed (constipation). )  . folic acid (FOLVITE) 1 MG tablet Take 1 tablet (1 mg total) by mouth daily.  . furosemide (LASIX) 40 MG  tablet TAKE 1 TABLET (40 MG TOTAL) BY MOUTH DAILY AS NEEDED FOR FLUID OR EDEMA.  [name] Kitchen HYDROcodone-acetaminophen (NORCO) 10-325 MG tablet Take 1 tablet by mouth every 6 (six) hours as needed for moderate pain.  . metFORMIN (GLUCOPHAGE) 500 MG tablet TAKE ONE TABLET BY MOUTH TWICE A DAY WITH FOOD  . methocarbamol (ROBAXIN) 500 MG tablet Take 500 mg by mouth every 8 (eight) hours as needed for muscle spasms.   . Multiple Vitamin (MULTIVITAMIN WITH MINERALS) TABS tablet Take 1 tablet by mouth daily.  [name] Kitchen omeprazole (PRILOSEC) 40 MG capsule Take 1 capsule (40 mg total) by mouth daily.  . polyethylene glycol (MIRALAX / GLYCOLAX) packet Take 17 g by mouth daily as needed. (Patient taking differently: Take 17 g by mouth daily as needed (constipation). )  . rivaroxaban (XARELTO) 20 MG TABS tablet Take 1 tablet (20 mg total) by mouth daily.  . sildenafil (VIAGRA) 100 MG tablet Take 1 tablet (100 mg total) by mouth daily as needed for erectile dysfunction.   No facility-administered encounter medications on file as of 07/26/2018.     Allergies as of 07/26/2018  . (No Known Allergies)    Past Medical History:  Diagnosis Date  . Chest pain   . Diabetes mellitus type 2 in obese (HCC) 11/29/2016  . ED (erectile dysfunction)   . GERD (gastroesophageal reflux disease)    [facility]  . Hx of colonoscopy   . Low back pain    dr Channing Mutters, dr Farris Has, dr Ethelene Hal, herniated disc  L4-5  . OSA (obstructive sleep apnea)    dr Shelle Iron  . PE (pulmonary embolism)    bilateral sep 2011 and again bilateral May 2012  . Primary hypercoagulable state (HCC)    no etiology found per Dr. Shirline Frees   . Pulmonary embolism (HCC) 02/22/2010   CT angio Dx  . Testosterone deficiency    dr Patsi Sears, shots every 2 weeks  . Thrombosis of arm    left arm 08/2010    Past Surgical History:  Procedure Laterality Date  . APPENDECTOMY    . cardiac stress test x 2     . COLONOSCOPY  08-19-09   per Dr. Evette Cristal, clear, repeat in 10 yrs   . esi       L4-5 dr Channing Mutters 03/2010  . ESOPHAGOGASTRODUODENOSCOPY     x2 - normal except reflux  . KNEE SURGERY     both  . LAPAROSCOPIC APPENDECTOMY  09/13/2012   Procedure: APPENDECTOMY LAPAROSCOPIC;  Surgeon: Almond Lint, MD;  Location: [facility];  Service: General;  Laterality: N/A;  . ROTATOR CUFF REPAIR  08-20-10   left dr Wyline Mood complicated by PE  . SHOULDER SURGERY     right and left    Family History  Problem Relation Age of Onset  . Hypertension Other   . Cancer Other        lung  . Coronary artery disease Neg Hx     Social History   Socioeconomic History  . Marital status: Married    Spouse name: Not on file  . Number of children: 3  . Years of education: Not on file  . Highest education level: Not on file  Occupational History  . Occupation: Garment/textile technologist: [employer]  Social Needs  . Financial resource strain: Not on file  . Food insecurity:    Worry: Not on file    Inability: Not on file  . Transportation needs:    Medical: Not on file    Non-medical: Not on file  Tobacco Use  . Smoking status: Never Smoker  . Smokeless tobacco: Never Used  . Tobacco comment: never used product  Substance and Sexual Activity  . Alcohol use: No    Alcohol/week: 0.0 standard drinks  . Drug use: No  . Sexual activity: Not on file  Lifestyle  . Physical activity:    Days per week: Not on file    Minutes per session: Not on file  . Stress: Not on file  Relationships  . Social connections:    Talks on phone: Not on file    Gets together: Not on file    Attends religious service: Not on file    Active member of club or organization: Not on file    Attends meetings of clubs or organizations: Not on file    Relationship status: Not on file  . Intimate partner violence:    Fear of current or ex partner: Not on file    Emotionally abused: Not on file    Physically abused: Not on file    Forced sexual activity: Not on file  Other Topics Concern  . Not  on file  Social History Narrative  . Not on file    Review of systems: Review of Systems  Constitutional: Negative for fever and chills.  HENT: Negative.   Eyes: Negative for blurred vision.  Respiratory: as per HPI  Cardiovascular: History of palpitations  gastrointestinal: Negative for vomiting, diarrhea, blood per rectum. Genitourinary:  Negative for dysuria, urgency, frequency and hematuria.  Musculoskeletal: Bilateral knee pain Skin: Negative for itching and rash.  Neurological: Negative for dizziness, tremors, focal weakness, seizures and loss of consciousness.  Endo/Heme/Allergies: Negative for environmental allergies.  Psychiatric/Behavioral: Negative for depression, suicidal ideas and hallucinations.  History of pulmonary embolism All other systems reviewed and are negative.  Physical Exam: Blood pressure 118/78, pulse 78, height 5' 10.5" (1.791 m), weight (!) 394 lb (178.7 kg), SpO2 95 %. Gen:      Morbid obesity HEENT:  EOMI, sclera anicteric Neck:     No masses; no thyromegaly Lungs:    Clear to auscultation bilaterally; normal respiratory effort CV:         Regular rate and rhythm; no murmurs Abd:      + bowel sounds; obese Ext:    Mild edema Skin:      Warm and dry; no rash Neuro: alert and oriented x 3 Psych: normal mood and affect  Data Reviewed: Compliance data shows a CPAP of 13 Residual AHI 1.1  Assessment:  Obstructive sleep apnea-treated with CPAP therapy of 13  Morbid obesity  History of thromboembolic disease    Plan/Recommendations:  CPAP compliance did reveal a residual AHI of 1.1 however patient did note that the pressure may not be optimal-may be increased if needed  He is very compliant with CPAP use  We did discuss repeating his study to ensure he is on optimal pressures but he would rather just have a prescription sent in for the same pressure setting  We will provide a prescription for CPAP at 13 to be sent to his DME company Has  been over 5 years since his study, he may require a repeat titration study-this was discussed with him  Encouraged efforts to continue to lose weight  Will tentatively see him back in the office in 6 months   Virl DiamondAdewale Olalere MD [facility] 07/26/2018, 12:14 PM  CC: Nelwyn SalisburyFry, Stephen A, MD

## 2018-07-26 NOTE — Patient Instructions (Signed)
Patient with obstructive sleep apnea  Compliant with CPAP therapy, treatment appears to be optimal -based on compliance data   Requires a new machine   We will set you up for titration study  Following the study, will provide a prescription for a new machine and settings  I will follow you up in 6 months

## 2018-07-27 ENCOUNTER — Telehealth: Payer: Self-pay | Admitting: Pulmonary Disease

## 2018-07-27 DIAGNOSIS — G4733 Obstructive sleep apnea (adult) (pediatric): Secondary | ICD-10-CM

## 2018-07-27 DIAGNOSIS — Z9989 Dependence on other enabling machines and devices: Secondary | ICD-10-CM

## 2018-07-27 NOTE — Telephone Encounter (Signed)
Office note says 13cm. [facility] - informed him of the above. Order will need to be redone.  This has been done. Nothing further needed at this time; will sign off.

## 2018-08-22 ENCOUNTER — Encounter (HOSPITAL_BASED_OUTPATIENT_CLINIC_OR_DEPARTMENT_OTHER): Payer: 59

## 2018-09-06 ENCOUNTER — Telehealth: Payer: Self-pay | Admitting: Family Medicine

## 2018-09-06 NOTE — Telephone Encounter (Signed)
Copied from CRM [phone]. Topic: General - Other >> Sep 06, 2018 11:39 AM Leafy Roobinson, Norma J wrote: Reason for CRM: pt needs a refill on oxycodone 10-325 mg  . [facility] [address]

## 2018-09-07 NOTE — Telephone Encounter (Signed)
Dr. Clent RidgesFry please advise.  The last OV and refill was July.  No pending appts.

## 2018-09-10 NOTE — Telephone Encounter (Signed)
He will need a PMV for this  

## 2018-09-11 NOTE — Telephone Encounter (Signed)
Called and spoke with pt and he is aware of Dr. Claris CheFry's recs.  appt scheduled for 9/25 at 2

## 2018-09-12 ENCOUNTER — Encounter: Payer: Self-pay | Admitting: Family Medicine

## 2018-09-12 ENCOUNTER — Ambulatory Visit (INDEPENDENT_AMBULATORY_CARE_PROVIDER_SITE_OTHER): Payer: 59 | Admitting: Family Medicine

## 2018-09-12 VITALS — BP 130/74 | HR 77 | Temp 98.6°F | Wt 398.5 lb

## 2018-09-12 DIAGNOSIS — M5440 Lumbago with sciatica, unspecified side: Secondary | ICD-10-CM | POA: Diagnosis not present

## 2018-09-12 DIAGNOSIS — G8929 Other chronic pain: Secondary | ICD-10-CM | POA: Diagnosis not present

## 2018-09-12 DIAGNOSIS — F1190 Opioid use, unspecified, uncomplicated: Secondary | ICD-10-CM | POA: Diagnosis not present

## 2018-09-12 MED ORDER — HYDROCODONE-ACETAMINOPHEN 10-325 MG PO TABS: 1.0000 | ORAL_TABLET | Freq: Four times a day (QID) | ORAL | 0 refills | Status: DC | PRN

## 2018-09-12 MED ORDER — HYDROCODONE-ACETAMINOPHEN 10-325 MG PO TABS: 1.0000 | ORAL_TABLET | Freq: Four times a day (QID) | ORAL | 0 refills | Status: AC | PRN

## 2018-09-12 NOTE — Progress Notes (Signed)
   Subjective:    Patient ID: Takahiro Godinho, male    DOB: 1956-02-08, 62 y.o.   MRN: [id number]  HPI Here for pain management. He is doing well.  Indication for chronic opioid: low back pain Medication and dose: Norco 10-325  # pills per month: 120 Last UDS date: 07-04-18 Opioid Treatment Agreement signed (Y/N): 07-04-18 Opioid Treatment Agreement last reviewed with patient:  09-12-18 [facility] reviewed this encounter (include red flags):  09-12-18    Review of Systems  Constitutional: Negative.   Respiratory: Negative.   Cardiovascular: Negative.   Musculoskeletal: Positive for back pain.  Neurological: Negative.        Objective:   Physical Exam  Constitutional: He is oriented to person, place, and time. He appears well-developed and well-nourished.  Cardiovascular: Normal rate, regular rhythm, normal heart sounds and intact distal pulses.  Pulmonary/Chest: Effort normal and breath sounds normal.  Neurological: He is alert and oriented to person, place, and time.          Assessment & Plan:  Pain management, meds were refilled.  [name], MD

## 2018-09-17 ENCOUNTER — Other Ambulatory Visit: Payer: Self-pay | Admitting: Family Medicine

## 2018-09-21 ENCOUNTER — Telehealth: Payer: Self-pay | Admitting: *Deleted

## 2018-09-21 NOTE — Telephone Encounter (Signed)
Prior auth for Hydrocodone-acteaminophen 10-325 sent to [email or web address] [id number].  Note in [employer] stated this received a favorable outcome and effective from 09/21/2018 through 10/20/2018.  I called [facility] and left a detailed message on the voicemail with this info.

## 2018-10-15 ENCOUNTER — Telehealth: Payer: Self-pay | Admitting: Family Medicine

## 2018-10-26 NOTE — Telephone Encounter (Signed)
Refill has been sent to the pharmacy.  

## 2018-10-26 NOTE — Telephone Encounter (Signed)
Please advise 

## 2018-10-26 NOTE — Telephone Encounter (Signed)
Pt following up on the request for the  XARELTO 20 MG TABS tablet  Pt states he must stay on this med as he is prone to blood clots in his lungs. Please send asap please.  [facility] [place], [place] - [address] [phone] (Phone) [phone] (Fax)

## 2018-12-10 ENCOUNTER — Telehealth: Payer: Self-pay | Admitting: Family Medicine

## 2018-12-10 NOTE — Telephone Encounter (Signed)
Copied from CRM [phone]. Topic: Quick Communication - Rx Refill/Question >> Dec 10, 2018  3:50 PM Maia PettiesOrtiz, Kristie S wrote: Medication: HYDROcodone-acetaminophen (NORCO) 10-325 MG tablet  - pt is out and states his knees are killing him - pt states he takes 1-3/day  Has the patient contacted their pharmacy? No - states he has to call MD - advised him to contact pharmacy in the future to send request [facility]erred [facility] (with phone number or street name[facility] [place] [place] [place] - [address] [phone] (Phone) [phone] (Fax)

## 2018-12-13 NOTE — Telephone Encounter (Signed)
He needs a PMV for this  

## 2018-12-15 ENCOUNTER — Other Ambulatory Visit: Payer: Self-pay | Admitting: Family Medicine

## 2018-12-17 NOTE — Telephone Encounter (Signed)
Attempted to call patient but had a bad connection.  Will try again at another time. CRM

## 2019-01-19 ENCOUNTER — Other Ambulatory Visit: Payer: Self-pay | Admitting: Family Medicine

## 2019-01-22 NOTE — Telephone Encounter (Signed)
Refills have been sent to the pharmacy 

## 2019-01-28 ENCOUNTER — Ambulatory Visit (INDEPENDENT_AMBULATORY_CARE_PROVIDER_SITE_OTHER): Payer: 59 | Admitting: Pulmonary Disease

## 2019-01-28 ENCOUNTER — Encounter: Payer: Self-pay | Admitting: Pulmonary Disease

## 2019-01-28 VITALS — BP 128/84 | HR 74 | Ht 70.5 in | Wt 395.0 lb

## 2019-01-28 DIAGNOSIS — G4733 Obstructive sleep apnea (adult) (pediatric): Secondary | ICD-10-CM | POA: Diagnosis not present

## 2019-01-28 DIAGNOSIS — Z9989 Dependence on other enabling machines and devices: Secondary | ICD-10-CM | POA: Diagnosis not present

## 2019-01-28 NOTE — Patient Instructions (Signed)
Obstructive sleep apnea Tolerating CPAP well  We will give you a call once we review a download if anything is untoward  Continue CPAP regularly Continue with your weight loss efforts  I will see you back in about 6 months

## 2019-01-28 NOTE — Progress Notes (Signed)
Gregory MayhewSteven Cortez    [id number]    [birth year]  Primary Care Physician:Fry, Tera MaterStephen A, MD  Referring Physician: Nelwyn SalisburyFry, Stephen A, MD [name], [place] [id number]  Chief complaint:   History of obstructive sleep apnea, on CPAP therapy Just received a new machine last couple of months Working well  HPI:   Patient seen today Doing well with CPAP Gained some weight recently Working on [employer] loss-exercise, diet Has 2 bad knees for which he required surgery but cannot get surgery until he loses over 100 pounds  He is to see Dr. 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 acetaminophen (TYLENOL) 500 MG tablet Take 1,000 mg by mouth every 4 (four) hours as needed (pain).  [name] Kitchen. ALPRAZolam (XANAX) 1 MG tablet TAKE ONE TABLET BY MOUTH THREE TIMES A DAY AS NEEDED  . diphenhydramine-acetaminophen (TYLENOL PM) 25-500 MG TABS Take 2 tablets by mouth at bedtime as needed (for sleep).   . docusate sodium (COLACE) 100 MG capsule Take 1 capsule (100 mg total) by mouth 2 (two) times daily. (Patient taking differently:  Take 100 mg by mouth 2 (two) times daily as needed (constipation). )  . folic acid (FOLVITE) 1 MG tablet Take 1 tablet (1 mg total) by mouth daily.  . furosemide (LASIX) 40 MG tablet TAKE 1 TABLET (40 MG TOTAL) BY MOUTH DAILY AS NEEDED FOR FLUID OR EDEMA.  . metFORMIN (GLUCOPHAGE) 500 MG tablet TAKE ONE TABLET BY MOUTH TWICE A DAY WITH FOOD  . methocarbamol (ROBAXIN) 500 MG tablet Take 500 mg by mouth every 8 (eight) hours as needed for muscle spasms.   . Multiple Vitamin (MULTIVITAMIN WITH MINERALS) TABS tablet Take 1 tablet by mouth daily.  [name] Kitchen. omeprazole (PRILOSEC) 40 MG capsule Take 1 capsule (40 mg total) by mouth daily.  . polyethylene glycol (MIRALAX / GLYCOLAX) packet Take 17 g by mouth daily as needed. (Patient taking differently: Take 17 g by mouth daily as needed (constipation). )  . sildenafil (VIAGRA) 100 MG tablet Take 1 tablet (100 mg total) by mouth daily as needed for erectile dysfunction.  Carlena Hurl. XARELTO 20 MG TABS tablet TAKE ONE TABLET BY MOUTH DAILY  . [DISCONTINUED] acidophilus (RISAQUAD) CAPS capsule Take 1 capsule by mouth daily.   No facility-administered encounter medications on file as of 01/28/2019.     Allergies as of 01/28/2019  . (No Known Allergies)    Past Medical History:  Diagnosis Date  . Chest pain   . Diabetes mellitus type 2 in obese (HCC) 11/29/2016  . ED (erectile dysfunction)   . GERD (gastroesophageal reflux disease)    [facility]  .  Hx of colonoscopy   . Low back pain    dr Channing Mutters, dr Farris Has, dr Ethelene Hal, herniated disc L4-5  . OSA (obstructive sleep apnea)    dr Shelle Iron  . PE (pulmonary embolism)    bilateral sep 2011 and again bilateral May 2012  . Primary hypercoagulable state (HCC)    no etiology found per Dr. Shirline Frees   . Pulmonary embolism (HCC) 02/22/2010   CT angio Dx  . Testosterone deficiency    dr Patsi Sears, shots every 2 weeks  . Thrombosis of arm    left arm 08/2010    Past Surgical History:  Procedure Laterality Date  . APPENDECTOMY     . cardiac stress test x 2     . COLONOSCOPY  08-19-09   per Dr. Evette Cristal, clear, repeat in 10 yrs   . esi     L4-5 dr Channing Mutters 03/2010  . ESOPHAGOGASTRODUODENOSCOPY     x2 - normal except reflux  . KNEE SURGERY     both  . LAPAROSCOPIC APPENDECTOMY  09/13/2012   Procedure: APPENDECTOMY LAPAROSCOPIC;  Surgeon: Almond Lint, MD;  Location: [facility];  Service: General;  Laterality: N/A;  . ROTATOR CUFF REPAIR  08-20-10   left dr Wyline Mood complicated by PE  . SHOULDER SURGERY     right and left    Family History  Problem Relation Age of Onset  . Hypertension Other   . Cancer Other        lung  . Coronary artery disease Neg Hx     Social History   Socioeconomic History  . Marital status: Married    Spouse name: Not on file  . Number of children: 3  . Years of education: Not on file  . Highest education level: Not on file  Occupational History  . Occupation: Garment/textile technologist: [employer]  Social Needs  . Financial resource strain: Not on file  . Food insecurity:    Worry: Not on file    Inability: Not on file  . Transportation needs:    Medical: Not on file    Non-medical: Not on file  Tobacco Use  . Smoking status: Never Smoker  . Smokeless tobacco: Never Used  . Tobacco comment: never used product  Substance and Sexual Activity  . Alcohol use: No    Alcohol/week: 0.0 standard drinks  . Drug use: No  . Sexual activity: Not on file  Lifestyle  . Physical activity:    Days per week: Not on file    Minutes per session: Not on file  . Stress: Not on file  Relationships  . Social connections:    Talks on phone: Not on file    Gets together: Not on file    Attends religious service: Not on file    Active member of club or organization: Not on file    Attends meetings of clubs or organizations: Not on file    Relationship status: Not on file  . Intimate partner violence:    Fear of current or ex partner: Not on file    Emotionally abused: Not on  file    Physically abused: Not on file    Forced sexual activity: Not on file  Other Topics Concern  . Not on file  Social History Narrative  . Not on file    Review of systems: Review of Systems  Constitutional: Negative for fever and chills.  Respiratory: as per HPI  Cardiovascular: History of palpitations  Genitourinary: Negative for dysuria, urgency, frequency and hematuria.  Musculoskeletal: Bilateral knee pain History of pulmonary embolism All other systems reviewed and are negative.  Physical Exam: Blood pressure 118/78, pulse 78, height 5' 10.5" (1.791 m), weight (!) 394 lb (178.7 kg), SpO2 95 %. Gen:      Morbid obesity HEENT:  EOMI, sclera anicteric Neck:     No masses; no thyromegaly Lungs:    Clear to auscultation bilaterally; normal respiratory effort CV:         Regular rate and rhythm; no murmurs  Data Reviewed: We will review compliance once available  Assessment:  Obstructive sleep apnea-treated with CPAP therapy of 13 -Recently received a new machine -Working well with no significant concerns  Morbid obesity  History of thromboembolic disease    Plan/Recommendations:  He is very compliant with CPAP use  Continue with CPAP use  Continue with weight loss efforts  He is on a CPAP of 13  Will tentatively see him back in the office in 6 months   Virl DiamondAdewale Tehya Leath MD [facility] 01/28/2019, 2:43 PM  CC: Nelwyn SalisburyFry, Stephen A, MD

## 2019-03-14 DIAGNOSIS — G4733 Obstructive sleep apnea (adult) (pediatric): Secondary | ICD-10-CM | POA: Diagnosis not present

## 2019-03-14 DIAGNOSIS — E661 Drug-induced obesity: Secondary | ICD-10-CM | POA: Diagnosis not present

## 2019-04-14 DIAGNOSIS — G4733 Obstructive sleep apnea (adult) (pediatric): Secondary | ICD-10-CM | POA: Diagnosis not present

## 2019-04-14 DIAGNOSIS — E661 Drug-induced obesity: Secondary | ICD-10-CM | POA: Diagnosis not present

## 2019-04-26 ENCOUNTER — Telehealth: Payer: Self-pay | Admitting: Family Medicine

## 2019-04-26 NOTE — Telephone Encounter (Signed)
Copied from CRM [id number]. Topic: General - Other >> Apr 26, 2019  2:10 PM Gregory Cortez wrote: Reason for CRM: pt need letter stating that he is disable and why. Pt is trying to get disability. Please call pt to pick up letter when ready.

## 2019-04-26 NOTE — Telephone Encounter (Signed)
Dr Fry please advise. thanks 

## 2019-04-29 ENCOUNTER — Ambulatory Visit (INDEPENDENT_AMBULATORY_CARE_PROVIDER_SITE_OTHER): Payer: BLUE CROSS/BLUE SHIELD | Admitting: Family Medicine

## 2019-04-29 ENCOUNTER — Encounter: Payer: Self-pay | Admitting: *Deleted

## 2019-04-29 ENCOUNTER — Encounter: Payer: Self-pay | Admitting: Family Medicine

## 2019-04-29 ENCOUNTER — Other Ambulatory Visit: Payer: Self-pay

## 2019-04-29 DIAGNOSIS — M5440 Lumbago with sciatica, unspecified side: Secondary | ICD-10-CM | POA: Diagnosis not present

## 2019-04-29 DIAGNOSIS — G8929 Other chronic pain: Secondary | ICD-10-CM

## 2019-04-29 DIAGNOSIS — G4730 Sleep apnea, unspecified: Secondary | ICD-10-CM | POA: Diagnosis not present

## 2019-04-29 DIAGNOSIS — M25562 Pain in left knee: Secondary | ICD-10-CM

## 2019-04-29 DIAGNOSIS — E669 Obesity, unspecified: Secondary | ICD-10-CM

## 2019-04-29 DIAGNOSIS — E1169 Type 2 diabetes mellitus with other specified complication: Secondary | ICD-10-CM

## 2019-04-29 DIAGNOSIS — M25561 Pain in right knee: Secondary | ICD-10-CM

## 2019-04-29 DIAGNOSIS — Z7901 Long term (current) use of anticoagulants: Secondary | ICD-10-CM

## 2019-04-29 NOTE — Progress Notes (Signed)
Subjective:    Patient ID: Gregory Cortez, male    DOB: Jul 04, 1956, 63 y.o.   MRN: [id number]  HPI Virtual Visit via Video Note  I connected with the patient on 04/29/19 at  1:45 PM EDT by a video enabled telemedicine application and verified that I am speaking with the correct person using two identifiers.  Location patient: home Location provider:work or home office Persons participating in the virtual visit: patient, provider  I discussed the limitations of evaluation and management by telemedicine and the availability of in person appointments. The patient expressed understanding and agreed to proceed.   HPI: Here to discuss applying for disability. He has severe degenerative arthritis in both knees and he is in constant pain from this. He can barely walk at times. He sees Dr. Ollen Gross and he knows both knees need to be replaced. However he is morbidly obese and his diabetes is not well controlled, and Dr. Lequita Halt refuses to consider any surgery until Brett Canales loses at least 150 lbs. Brett Canales has consulted with [facility] about getting a sleeve gastrectomy and they want to pursue this. He has some paperwork for me to fill out along those lines. He was laid off from his job due to the Covid-19 pandemic, and he has been unable to find any work. In fact he feels he is unable to work at all due to his obesity and the chronic knee pain. He has spoken to a local disability attorney, and he plans to apply for disability. He needs a letter from me stating that I support this position.    ROS: See pertinent positives and negatives per HPI.  Past Medical History:  Diagnosis Date  . Chest pain   . Diabetes mellitus type 2 in obese (HCC) 11/29/2016  . ED (erectile dysfunction)   . GERD (gastroesophageal reflux disease)    [facility]  . Hx of colonoscopy   . Low back pain    dr Channing Mutters, dr Farris Has, dr Ethelene Hal, herniated disc L4-5  . OSA (obstructive sleep apnea)    dr Shelle Iron  . PE  (pulmonary embolism)    bilateral sep 2011 and again bilateral May 2012  . Primary hypercoagulable state (HCC)    no etiology found per Dr. Shirline Frees   . Pulmonary embolism (HCC) 02/22/2010   CT angio Dx  . Testosterone deficiency    dr Patsi Sears, shots every 2 weeks  . Thrombosis of arm    left arm 08/2010    Past Surgical History:  Procedure Laterality Date  . APPENDECTOMY    . cardiac stress test x 2     . COLONOSCOPY  08-19-09   per Dr. Evette Cristal, clear, repeat in 10 yrs   . esi     L4-5 dr Channing Mutters 03/2010  . ESOPHAGOGASTRODUODENOSCOPY     x2 - normal except reflux  . KNEE SURGERY     both  . LAPAROSCOPIC APPENDECTOMY  09/13/2012   Procedure: APPENDECTOMY LAPAROSCOPIC;  Surgeon: Almond Lint, MD;  Location: [facility];  Service: General;  Laterality: N/A;  . ROTATOR CUFF REPAIR  08-20-10   left dr Wyline Mood complicated by PE  . SHOULDER SURGERY     right and left    Family History  Problem Relation Age of Onset  . Hypertension Other   . Cancer Other        lung  . Coronary artery disease Neg Hx      Current Outpatient Medications:  .  acetaminophen (TYLENOL) 500 MG  tablet, Take 1,000 mg by mouth every 4 (four) hours as needed (pain)., Disp: , Rfl:  .  ALPRAZolam (XANAX) 1 MG tablet, TAKE ONE TABLET BY MOUTH THREE TIMES A DAY AS NEEDED, Disp: 90 tablet, Rfl: 4 .  diphenhydramine-acetaminophen (TYLENOL PM) 25-500 MG TABS, Take 2 tablets by mouth at bedtime as needed (for sleep). , Disp: , Rfl:  .  docusate sodium (COLACE) 100 MG capsule, Take 1 capsule (100 mg total) by mouth 2 (two) times daily. (Patient taking differently: Take 100 mg by mouth 2 (two) times daily as needed (constipation). ), Disp: 2 capsule, Rfl: 0 .  folic acid (FOLVITE) 1 MG tablet, Take 1 tablet (1 mg total) by mouth daily., Disp: 30 tablet, Rfl: 11 .  furosemide (LASIX) 40 MG tablet, TAKE 1 TABLET (40 MG TOTAL) BY MOUTH DAILY AS NEEDED FOR FLUID OR EDEMA., Disp: 90 tablet, Rfl: 1 .  metFORMIN (GLUCOPHAGE) 500 MG  tablet, TAKE ONE TABLET BY MOUTH TWICE A DAY WITH FOOD, Disp: 180 tablet, Rfl: 3 .  methocarbamol (ROBAXIN) 500 MG tablet, Take 500 mg by mouth every 8 (eight) hours as needed for muscle spasms. , Disp: , Rfl: 0 .  Multiple Vitamin (MULTIVITAMIN WITH MINERALS) TABS tablet, Take 1 tablet by mouth daily., Disp: , Rfl:  .  omeprazole (PRILOSEC) 40 MG capsule, Take 1 capsule (40 mg total) by mouth daily., Disp: 90 capsule, Rfl: 3 .  polyethylene glycol (MIRALAX / GLYCOLAX) packet, Take 17 g by mouth daily as needed. (Patient taking differently: Take 17 g by mouth daily as needed (constipation). ), Disp: 1 each, Rfl:  .  sildenafil (VIAGRA) 100 MG tablet, Take 1 tablet (100 mg total) by mouth daily as needed for erectile dysfunction., Disp: 30 tablet, Rfl: 3 .  XARELTO 20 MG TABS tablet, TAKE ONE TABLET BY MOUTH DAILY, Disp: 30 tablet, Rfl: 3  EXAM:  VITALS per patient if applicable:  GENERAL: alert, oriented, appears well and in no acute distress  HEENT: atraumatic, conjunttiva clear, no obvious abnormalities on inspection of external nose and ears  NECK: normal movements of the head and neck  LUNGS: on inspection no signs of respiratory distress, breathing rate appears normal, no obvious gross SOB, gasping or wheezing  CV: no obvious cyanosis  MS: moves all visible extremities without noticeable abnormality  PSYCH/NEURO: pleasant and cooperative, no obvious depression or anxiety, speech and thought processing grossly intact  ASSESSMENT AND PLAN: He has morbid obesity and chronic severe bilateral knee pain, and I agree that he is totally unable to work at this point. We will draft a letter stating this for him to give to his attorney. We will also fill out the paperwork from Surgery to prepare for a possible bariatric procedure.  Gershon CraneStephen Fry, MD  Discussed the following assessment and plan:  No diagnosis found.     I discussed the assessment and treatment plan with the patient. The  patient was provided an opportunity to ask questions and all were answered. The patient agreed with the plan and demonstrated an understanding of the instructions.   The patient was advised to call back or seek an in-person evaluation if the symptoms worsen or if the condition fails to improve as anticipated.     Review of Systems     Objective:   Physical Exam        Assessment & Plan:

## 2019-04-29 NOTE — Telephone Encounter (Signed)
He needs to have an OV with me to discuss this

## 2019-04-29 NOTE — Telephone Encounter (Signed)
Message sent to the patient. 

## 2019-04-30 ENCOUNTER — Telehealth: Payer: Self-pay | Admitting: Family Medicine

## 2019-04-30 NOTE — Telephone Encounter (Signed)
Source Subject Topic  [name], Gregory Cortez (Patient) Hey, Gregory Cortez (Patient) General - Other  Summary: letter faxed  Reason for CRM: pt would like to have copy of todays letter to be faxed to his lawyer. The fax number is with the ppw that is in the office   Cb is [phone]   If paperwork did not give authorization to send information to attorney will need to have signed release first.

## 2019-04-30 NOTE — Telephone Encounter (Signed)
Patient dropped off Disability forms and picked up the letter that Dr. Clent Ridges completed.  Fax form to : [phone]  Disposition: Dr's Folder

## 2019-05-01 DIAGNOSIS — Z0279 Encounter for issue of other medical certificate: Secondary | ICD-10-CM

## 2019-05-01 DIAGNOSIS — G4733 Obstructive sleep apnea (adult) (pediatric): Secondary | ICD-10-CM | POA: Diagnosis not present

## 2019-05-14 ENCOUNTER — Telehealth: Payer: Self-pay | Admitting: Family Medicine

## 2019-05-14 DIAGNOSIS — E661 Drug-induced obesity: Secondary | ICD-10-CM | POA: Diagnosis not present

## 2019-05-14 DIAGNOSIS — G4733 Obstructive sleep apnea (adult) (pediatric): Secondary | ICD-10-CM | POA: Diagnosis not present

## 2019-05-14 NOTE — Telephone Encounter (Signed)
Copied from CRM [id number]. Topic: Quick Communication - Rx Refill/Question >> May 14, 2019  1:14 PM Jolayne Haines L wrote: Medication: HYDROcodone-acetaminophen (NORCO) 10-325 MG tablet [[id number]]  ENDED   Has the patient contacted their pharmacy? No (Agent: If no, request that the patient contact the pharmacy for the refill.) (Agent: If yes, when and what did the pharmacy advise?)  Preferred Pharmacy (with phone number or street name): [facility] [place], [place] - [address] [place] [address] Phone: [phone] Fax: [phone]    Agent: Please be advised that RX refills may take up to 3 business days. We ask that you follow-up with your pharmacy.

## 2019-05-14 NOTE — Telephone Encounter (Signed)
Patient said the letter was never received by his attorney. Can this be re-faxed?

## 2019-05-16 NOTE — Telephone Encounter (Signed)
Letter has been faxed to the lawyer the pt listed on the release form.

## 2019-05-17 NOTE — Telephone Encounter (Signed)
Dr. Fry please advise on refill of medication.   

## 2019-05-18 ENCOUNTER — Other Ambulatory Visit: Payer: Self-pay | Admitting: Family Medicine

## 2019-05-20 ENCOUNTER — Encounter: Payer: Self-pay | Admitting: *Deleted

## 2019-05-20 NOTE — Telephone Encounter (Signed)
He needs a PMV for this  

## 2019-05-21 ENCOUNTER — Ambulatory Visit (INDEPENDENT_AMBULATORY_CARE_PROVIDER_SITE_OTHER): Payer: BC Managed Care – PPO | Admitting: Family Medicine

## 2019-05-21 ENCOUNTER — Encounter: Payer: Self-pay | Admitting: Family Medicine

## 2019-05-21 ENCOUNTER — Other Ambulatory Visit: Payer: Self-pay

## 2019-05-21 DIAGNOSIS — M5440 Lumbago with sciatica, unspecified side: Secondary | ICD-10-CM | POA: Diagnosis not present

## 2019-05-21 DIAGNOSIS — G8929 Other chronic pain: Secondary | ICD-10-CM

## 2019-05-21 DIAGNOSIS — F1190 Opioid use, unspecified, uncomplicated: Secondary | ICD-10-CM

## 2019-05-21 MED ORDER — HYDROCODONE-ACETAMINOPHEN 10-325 MG PO TABS: 1.0000 | ORAL_TABLET | Freq: Four times a day (QID) | ORAL | 0 refills | Status: AC | PRN

## 2019-05-21 MED ORDER — HYDROCODONE-ACETAMINOPHEN 10-325 MG PO TABS: 1.0000 | ORAL_TABLET | Freq: Four times a day (QID) | ORAL | 0 refills | Status: DC | PRN

## 2019-05-21 NOTE — Progress Notes (Signed)
Subjective:    Patient ID: Gregory Cortez, male    DOB: May 07, 1956, 63 y.o.   MRN: [id number]  HPI Virtual Visit via Video Note  I connected with the patient on 05/21/19 at  3:00 PM EDT by a video enabled telemedicine application and verified that I am speaking with the correct person using two identifiers.  Location patient: home Location provider:work or home office Persons participating in the virtual visit: patient, provider  I discussed the limitations of evaluation and management by telemedicine and the availability of in person appointments. The patient expressed understanding and agreed to proceed.   HPI: Here for pain management, he is doing fairly well.  Indication for chronic opioid: low back pain Medication and dose: Norco 10-325 # pills per month: 120 Last UDS date: 07-04-18 Opioid Treatment Agreement signed (Y/N): 07-04-18 Opioid Treatment Agreement last reviewed with patient:  05-21-19 N[facility]SRS reviewed this encounter (include red flags):  05-21-19    ROS: See pertinent positives and negatives per HPI.  Past Medical History:  Diagnosis Date  . Chest pain   . Diabetes mellitus type 2 in obese (HCC) 11/29/2016  . ED (erectile dysfunction)   . GERD (gastroesophageal reflux disease)    [facility]  . Hx of colonoscopy   . Low back pain    dr Channing Muttersroy, dr Farris Haskramer, dr Ethelene Halramos, herniated disc L4-5  . OSA (obstructive sleep apnea)    dr Shelle Ironclance  . PE (pulmonary embolism)    bilateral sep 2011 and again bilateral May 2012  . Primary hypercoagulable state (HCC)    no etiology found per Dr. Shirline FreesMohammed   . Pulmonary embolism (HCC) 02/22/2010   CT angio Dx  . Testosterone deficiency    dr Patsi Searstannenbaum, shots every 2 weeks  . Thrombosis of arm    left arm 08/2010    Past Surgical History:  Procedure Laterality Date  . APPENDECTOMY    . cardiac stress test x 2     . COLONOSCOPY  08-19-09   per Dr. Evette CristalGanem, clear, repeat in 10 yrs   . esi     L4-5 dr Channing Muttersroy 03/2010  .  ESOPHAGOGASTRODUODENOSCOPY     x2 - normal except reflux  . KNEE SURGERY     both  . LAPAROSCOPIC APPENDECTOMY  09/13/2012   Procedure: APPENDECTOMY LAPAROSCOPIC;  Surgeon: Almond LintFaera Byerly, MD;  Location: [facility];  Service: General;  Laterality: N/A;  . ROTATOR CUFF REPAIR  08-20-10   left dr Wyline Moodweiner complicated by PE  . SHOULDER SURGERY     right and left    Family History  Problem Relation Age of Onset  . Hypertension Other   . Cancer Other        lung  . Coronary artery disease Neg Hx      Current Outpatient Medications:  .  acetaminophen (TYLENOL) 500 MG tablet, Take 1,000 mg by mouth every 4 (four) hours as needed (pain)., Disp: , Rfl:  .  ALPRAZolam (XANAX) 1 MG tablet, TAKE ONE TABLET BY MOUTH THREE TIMES A DAY AS NEEDED, Disp: 90 tablet, Rfl: 4 .  diphenhydramine-acetaminophen (TYLENOL PM) 25-500 MG TABS, Take 2 tablets by mouth at bedtime as needed (for sleep). , Disp: , Rfl:  .  docusate sodium (COLACE) 100 MG capsule, Take 1 capsule (100 mg total) by mouth 2 (two) times daily. (Patient taking differently: Take 100 mg by mouth 2 (two) times daily as needed (constipation). ), Disp: 2 capsule, Rfl: 0 .  folic acid (FOLVITE) 1 MG  tablet, Take 1 tablet (1 mg total) by mouth daily., Disp: 30 tablet, Rfl: 11 .  furosemide (LASIX) 40 MG tablet, TAKE 1 TABLET (40 MG TOTAL) BY MOUTH DAILY AS NEEDED FOR FLUID OR EDEMA., Disp: 90 tablet, Rfl: 1 .  metFORMIN (GLUCOPHAGE) 500 MG tablet, TAKE ONE TABLET BY MOUTH TWICE A DAY WITH FOOD, Disp: 180 tablet, Rfl: 3 .  methocarbamol (ROBAXIN) 500 MG tablet, Take 500 mg by mouth every 8 (eight) hours as needed for muscle spasms. , Disp: , Rfl: 0 .  Multiple Vitamin (MULTIVITAMIN WITH MINERALS) TABS tablet, Take 1 tablet by mouth daily., Disp: , Rfl:  .  omeprazole (PRILOSEC) 40 MG capsule, Take 1 capsule (40 mg total) by mouth daily., Disp: 90 capsule, Rfl: 3 .  polyethylene glycol (MIRALAX / GLYCOLAX) packet, Take 17 g by mouth daily as needed. (Patient  taking differently: Take 17 g by mouth daily as needed (constipation). ), Disp: 1 each, Rfl:  .  sildenafil (VIAGRA) 100 MG tablet, Take 1 tablet (100 mg total) by mouth daily as needed for erectile dysfunction., Disp: 30 tablet, Rfl: 3 .  XARELTO 20 MG TABS tablet, TAKE ONE TABLET BY MOUTH DAILY, Disp: 30 tablet, Rfl: 2  EXAM:  VITALS per patient if applicable:  GENERAL: alert, oriented, appears well and in no acute distress  HEENT: atraumatic, conjunttiva clear, no obvious abnormalities on inspection of external nose and ears  NECK: normal movements of the head and neck  LUNGS: on inspection no signs of respiratory distress, breathing rate appears normal, no obvious gross SOB, gasping or wheezing  CV: no obvious cyanosis  MS: moves all visible extremities without noticeable abnormality  PSYCH/NEURO: pleasant and cooperative, no obvious depression or anxiety, speech and thought processing grossly intact  ASSESSMENT AND PLAN: Pain management, meds were refilled.  [name], MD  Discussed the following assessment and plan:  No diagnosis found.     I discussed the assessment and treatment plan with the patient. The patient was provided an opportunity to ask questions and all were answered. The patient agreed with the plan and demonstrated an understanding of the instructions.   The patient was advised to call back or seek an in-person evaluation if the symptoms worsen or if the condition fails to improve as anticipated.     Review of Systems     Objective:   Physical Exam        Assessment & Plan:

## 2019-06-03 ENCOUNTER — Telehealth: Payer: Self-pay

## 2019-06-03 NOTE — Telephone Encounter (Signed)
PA for HYDROcodone-acetaminophen (NORCO) 10-325 MG tablet has been sent to cover my meds.   Key: [id number] - Rx #: [id number]

## 2019-06-06 DIAGNOSIS — G4733 Obstructive sleep apnea (adult) (pediatric): Secondary | ICD-10-CM | POA: Diagnosis not present

## 2019-06-06 DIAGNOSIS — Z9989 Dependence on other enabling machines and devices: Secondary | ICD-10-CM | POA: Diagnosis not present

## 2019-06-06 DIAGNOSIS — Z7901 Long term (current) use of anticoagulants: Secondary | ICD-10-CM | POA: Diagnosis not present

## 2019-06-11 ENCOUNTER — Telehealth: Payer: Self-pay | Admitting: Family Medicine

## 2019-06-11 NOTE — Telephone Encounter (Signed)
Medication Refill - Medication: HYDROcodone-acetaminophen (NORCO) 10-325 MG tablet [[id number]]  XARELTO 20 MG TABS tablet [[id number]]     Has the patient contacted their pharmacy? No. (Agent: If no, request that the patient contact the pharmacy for the refill.) They were only able to give him 7 days so Dr. Sarajane Jews needs to send the pharmacy permission. Also is insurance will approve a 90 day supply of the Xarelto they just need a letter from the doctor giving permission for this also.   Preferred Pharmacy (with phone number or street name):  [facility] [place] [phone] (Phone) [phone] (Fax)     Agent: Please be advised that RX refills may take up to 3 business days. We ask that you follow-up with your pharmacy.

## 2019-06-12 ENCOUNTER — Encounter: Payer: Self-pay | Admitting: *Deleted

## 2019-06-12 NOTE — Telephone Encounter (Signed)
Refills are at the pharmacy.  Pt will need to contact them to have these filled.

## 2019-06-13 ENCOUNTER — Other Ambulatory Visit: Payer: Self-pay | Admitting: Surgery

## 2019-06-13 ENCOUNTER — Other Ambulatory Visit (HOSPITAL_COMMUNITY): Payer: Self-pay | Admitting: Surgery

## 2019-06-13 DIAGNOSIS — Z6841 Body Mass Index (BMI) 40.0 and over, adult: Secondary | ICD-10-CM

## 2019-06-14 DIAGNOSIS — E661 Drug-induced obesity: Secondary | ICD-10-CM | POA: Diagnosis not present

## 2019-06-14 DIAGNOSIS — G4733 Obstructive sleep apnea (adult) (pediatric): Secondary | ICD-10-CM | POA: Diagnosis not present

## 2019-06-24 ENCOUNTER — Ambulatory Visit (HOSPITAL_COMMUNITY)
Admission: RE | Admit: 2019-06-24 | Discharge: 2019-06-24 | Disposition: A | Discharge status: Home / Self Care | Payer: BC Managed Care – PPO | Source: Ambulatory Visit | Attending: Surgery | Admitting: Surgery

## 2019-06-24 ENCOUNTER — Other Ambulatory Visit: Payer: Self-pay

## 2019-06-24 ENCOUNTER — Other Ambulatory Visit (HOSPITAL_COMMUNITY): Payer: Self-pay | Admitting: Surgery

## 2019-06-24 DIAGNOSIS — Z6841 Body Mass Index (BMI) 40.0 and over, adult: Secondary | ICD-10-CM | POA: Insufficient documentation

## 2019-06-24 DIAGNOSIS — K449 Diaphragmatic hernia without obstruction or gangrene: Secondary | ICD-10-CM | POA: Diagnosis not present

## 2019-06-24 DIAGNOSIS — Z01818 Encounter for other preprocedural examination: Secondary | ICD-10-CM | POA: Diagnosis not present

## 2019-06-25 ENCOUNTER — Encounter: Payer: BC Managed Care – PPO | Attending: Surgery | Admitting: Skilled Nursing Facility1

## 2019-06-25 ENCOUNTER — Encounter: Payer: Self-pay | Admitting: Skilled Nursing Facility1

## 2019-06-25 DIAGNOSIS — E669 Obesity, unspecified: Secondary | ICD-10-CM | POA: Insufficient documentation

## 2019-06-25 DIAGNOSIS — E119 Type 2 diabetes mellitus without complications: Secondary | ICD-10-CM

## 2019-06-25 NOTE — Progress Notes (Signed)
Pre-Op Assessment Visit:  Pre-Operative Sleeve Surgery  Medical Nutrition Therapy:  Appt start time: 2:40  End time:  3:50  Patient was seen on 06/25/2019 for Pre-Operative Nutrition Assessment. Assessment and letter of approval faxed to [facility] Bariatric Surgery Program coordinator on 06/25/2019.    Referral Stated SWL Appointments Required: 0 According to referral surgeon wants pt t lose as much weight as possible.   Proposed Surgery Type: Sleeve Gastrectomy  Pt expectation of surgery: bilateral knee replacement   Pt expectation of dietitian: for help   NUTRITION ASSESSMENT   Anthropometrics  Start weight at [facility]: 399.3 lbs Today's weight: 399.3 lbs BMI: 57.29 kg/m2     Psychosocial/Lifestyle Dx: diabetes  Pt states he takes his blood sugar a few times a day: numbers about 200. Pt arrives with low mobility due to his knee. Pt states he has gained a lot of weight due to corona. Pt states he was laid off in April.   Medications: Metformin  Labs: A1C 6  Notable Signs/Symptoms Knee     24-Hr Dietary Recall: Red meat and eating out 75% of meals First Meal: protein powder fruit smoothie Snack: protein bar Second Meal: sandwich or chinese Snack: popcorn Third Meal: sandwich or chinese Snack: ice cream Beverages: diet pepsi, water, green tea, water with branched chain amino acids with green powder     Physical Activity  ADL's    Estimated Energy Needs Calories: 1800 Carbohydrate: 200 Protein: 235 Fat: 50   NUTRITION DIAGNOSIS  Overweight/obesity ([place]-3.3) related to past poor dietary habits and physical inactivity as evidenced by patient w/ planned Sleeve surgery following dietary guidelines for continued weight loss.    NUTRITION INTERVENTION  Nutrition counseling (C-1) and education (E-2) to facilitate bariatric surgery goals.   Handouts given during visit include:  . Pre-Op Goals . Bariatric Surgery Protein Shakes . Vitamin and Mineral  Options    During the appointment today the following Pre-Op Goals were reviewed with the patient: . Log your food and beverage via an app or pen and paper  . Make healthy food choices . Begin to limit portion sizes . Limited concentrated sugars and fried foods . Keep fat/sugar in the single digits per serving on              food labels . Practice CHEWING your food  (aim for 30 chews per bite or until applesauce consistency) . Practice not drinking 15 minutes before, during, and 30 minutes after each meal/snack . Avoid all carbonated beverages  . Avoid/limit caffeinated beverages  . Avoid all sugar-sweetened beverages . Consume 3 meals per day; eat every 3-5 hours . Make a list of non-food related activities . Aim for 64-100 ounces of FLUID daily  . Aim for at least 60-80 grams of PROTEIN daily . Look for a liquid protein source that contain ?15 g protein and ?5 g carbohydrate  (ex: shakes, drinks, shots)   Change readiness: Contemplative   Demonstrated degree of understanding via: Teach Back      MONITORING & EVALUATION Dietary intake, weekly physical activity, body weight, and pre-op goals reached.    Next Steps  Patient is to call [facility] (once surgery date is scheduled) to be scheduled for Pre-Op Class, which must be at least 2 weeks prior to surgery date or back follow up visit in 1 month

## 2019-07-12 NOTE — Telephone Encounter (Signed)
PA has been approved. Patient has been notified

## 2019-07-14 DIAGNOSIS — G4733 Obstructive sleep apnea (adult) (pediatric): Secondary | ICD-10-CM | POA: Diagnosis not present

## 2019-07-14 DIAGNOSIS — E661 Drug-induced obesity: Secondary | ICD-10-CM | POA: Diagnosis not present

## 2019-07-21 ENCOUNTER — Other Ambulatory Visit: Payer: Self-pay | Admitting: Hematology & Oncology

## 2019-08-19 ENCOUNTER — Other Ambulatory Visit: Payer: Self-pay | Admitting: Family Medicine

## 2019-08-20 ENCOUNTER — Other Ambulatory Visit: Payer: Self-pay

## 2019-08-20 ENCOUNTER — Ambulatory Visit (INDEPENDENT_AMBULATORY_CARE_PROVIDER_SITE_OTHER): Payer: BC Managed Care – PPO | Admitting: Family Medicine

## 2019-08-20 ENCOUNTER — Encounter: Payer: Self-pay | Admitting: Family Medicine

## 2019-08-20 VITALS — BP 130/80 | HR 82 | Temp 98.0°F | Wt 394.0 lb

## 2019-08-20 DIAGNOSIS — E1169 Type 2 diabetes mellitus with other specified complication: Secondary | ICD-10-CM | POA: Diagnosis not present

## 2019-08-20 DIAGNOSIS — E669 Obesity, unspecified: Secondary | ICD-10-CM | POA: Diagnosis not present

## 2019-08-20 DIAGNOSIS — Z125 Encounter for screening for malignant neoplasm of prostate: Secondary | ICD-10-CM

## 2019-08-20 DIAGNOSIS — Z0000 Encounter for general adult medical examination without abnormal findings: Secondary | ICD-10-CM

## 2019-08-20 LAB — CBC WITH DIFFERENTIAL/PLATELET
Basophils Absolute: 0.1 10*3/uL (ref 0.0–0.1)
Basophils Relative: 1 % (ref 0.0–3.0)
Eosinophils Absolute: 0.2 10*3/uL (ref 0.0–0.7)
Eosinophils Relative: 4.3 % (ref 0.0–5.0)
HCT: 42.5 % (ref 39.0–52.0)
Hemoglobin: 14 g/dL (ref 13.0–17.0)
Lymphocytes Relative: 19.4 % (ref 12.0–46.0)
Lymphs Abs: 1 10*3/uL (ref 0.7–4.0)
MCHC: 33.1 g/dL (ref 30.0–36.0)
MCV: 93.8 fl (ref 78.0–100.0)
Monocytes Absolute: 0.4 10*3/uL (ref 0.1–1.0)
Monocytes Relative: 7 % (ref 3.0–12.0)
Neutro Abs: 3.7 10*3/uL (ref 1.4–7.7)
Neutrophils Relative %: 68.3 % (ref 43.0–77.0)
Platelets: 229 10*3/uL (ref 150.0–400.0)
RBC: 4.53 Mil/uL (ref 4.22–5.81)
RDW: 13.1 % (ref 11.5–15.5)
WBC: 5.4 10*3/uL (ref 4.0–10.5)

## 2019-08-20 LAB — TSH: TSH: 1.21 u[IU]/mL (ref 0.35–4.50)

## 2019-08-20 LAB — HEPATIC FUNCTION PANEL
ALT: 40 U/L (ref 0–53)
AST: 27 U/L (ref 0–37)
Albumin: 4.1 g/dL (ref 3.5–5.2)
Alkaline Phosphatase: 88 U/L (ref 39–117)
Bilirubin, Direct: 0.1 mg/dL (ref 0.0–0.3)
Total Bilirubin: 0.6 mg/dL (ref 0.2–1.2)
Total Protein: 7 g/dL (ref 6.0–8.3)

## 2019-08-20 LAB — BASIC METABOLIC PANEL
BUN: 10 mg/dL (ref 6–23)
CO2: 27 mEq/L (ref 19–32)
Calcium: 9.2 mg/dL (ref 8.4–10.5)
Chloride: 103 mEq/L (ref 96–112)
Creatinine, Ser: 0.76 mg/dL (ref 0.40–1.50)
GFR: 104.35 mL/min (ref >60.00)
Glucose, Bld: 245 mg/dL — ABNORMAL HIGH (ref 70–99)
Potassium: 4.3 mEq/L (ref 3.5–5.1)
Sodium: 141 mEq/L (ref 135–145)

## 2019-08-20 LAB — LIPID PANEL
Cholesterol: 175 mg/dL (ref 0–200)
HDL: 28.6 mg/dL — ABNORMAL LOW (ref >39.00)
LDL Cholesterol: 115 mg/dL — ABNORMAL HIGH (ref 0–99)
NonHDL: 145.99
Total CHOL/HDL Ratio: 6
Triglycerides: 155 mg/dL — ABNORMAL HIGH (ref 0.0–149.0)
VLDL: 31 mg/dL (ref 0.0–40.0)

## 2019-08-20 LAB — POC URINALSYSI DIPSTICK (AUTOMATED)
Bilirubin, UA: NEGATIVE
Blood, UA: NEGATIVE
Glucose, UA: NEGATIVE
Ketones, UA: NEGATIVE
Leukocytes, UA: NEGATIVE
Nitrite, UA: NEGATIVE
Protein, UA: POSITIVE — AB
Spec Grav, UA: >=1.03 — AB (ref 1.010–1.025)
Urobilinogen, UA: 0.2 E.U./dL
pH, UA: 6 (ref 5.0–8.0)

## 2019-08-20 LAB — PSA: PSA: 0.58 ng/mL (ref 0.10–4.00)

## 2019-08-20 LAB — HEMOGLOBIN A1C: Hgb A1c MFr Bld: 9.2 % — ABNORMAL HIGH (ref 4.6–6.5)

## 2019-08-20 MED ORDER — METFORMIN HCL 500 MG PO TABS: 500.0000 mg | ORAL_TABLET | Freq: Two times a day (BID) | ORAL | 3 refills | Status: DC

## 2019-08-20 MED ORDER — ALPRAZOLAM 1 MG PO TABS: 1.0000 mg | ORAL_TABLET | Freq: Three times a day (TID) | ORAL | 5 refills | Status: DC | PRN

## 2019-08-20 NOTE — Progress Notes (Signed)
Subjective:    Patient ID: Gregory Cortez, male    DOB: 1956/06/30, 63 y.o.   MRN: [id number]  HPI Here for a well exam. He feels well other than his chronic knee pain. He sees Dr. Lequita HaltAluisio for this and he hopes to have surgery on the knees after he gets his weight under control. He has been working with Dr. Ovidio Kinavid Newman at [facility] to prepare for a sleeve gastrectomy, and he hopes to have this done in October. His glucoses have been quite high lately, often in the high 200s fasting. He is due for a colonoscopy. He has not had an eye exam in years.    Review of Systems  Constitutional: Negative.   HENT: Negative.   Eyes: Negative.   Respiratory: Negative.   Cardiovascular: Negative.   Gastrointestinal: Negative.   Genitourinary: Negative.   Musculoskeletal: Positive for arthralgias.  Skin: Negative.   Neurological: Negative.   Psychiatric/Behavioral: Negative.        Objective:   Physical Exam Constitutional:      General: He is not in acute distress.    Appearance: He is well-developed. He is not diaphoretic.     Comments: Morbidly obese   HENT:     Head: Normocephalic and atraumatic.     Right Ear: External ear normal.     Left Ear: External ear normal.     Nose: Nose normal.     Mouth/Throat:     Pharynx: No oropharyngeal exudate.  Eyes:     General: No scleral icterus.       Right eye: No discharge.        Left eye: No discharge.     Conjunctiva/sclera: Conjunctivae normal.     Pupils: Pupils are equal, round, and reactive to light.  Neck:     Musculoskeletal: Neck supple.     Thyroid: No thyromegaly.     Vascular: No JVD.     Trachea: No tracheal deviation.  Cardiovascular:     Rate and Rhythm: Normal rate and regular rhythm.     Heart sounds: Normal heart sounds. No murmur. No friction rub. No gallop.   Pulmonary:     Effort: Pulmonary effort is normal. No respiratory distress.     Breath sounds: Normal breath sounds. No wheezing or rales.   Chest:     Chest wall: No tenderness.  Abdominal:     General: Bowel sounds are normal. There is no distension.     Palpations: Abdomen is soft. There is no mass.     Tenderness: There is no abdominal tenderness. There is no guarding or rebound.  Genitourinary:    Penis: No tenderness.   Musculoskeletal: Normal range of motion.        General: No tenderness.  Lymphadenopathy:     Cervical: No cervical adenopathy.  Skin:    General: Skin is warm and dry.     Coloration: Skin is not pale.     Findings: No erythema or rash.  Neurological:     Mental Status: He is alert and oriented to person, place, and time.     Cranial Nerves: No cranial nerve deficit.     Motor: No abnormal muscle tone.     Coordination: Coordination normal.     Deep Tendon Reflexes: Reflexes are normal and symmetric. Reflexes normal.  Psychiatric:        Behavior: Behavior normal.        Thought Content: Thought content normal.  Judgment: Judgment normal.           Assessment & Plan:  Well exam. We discussed diet and exercise. Get fasting labs including an A1c. We will refer him back to Dr. Penelope Coop for a colonoscopy. Set up an eye exam.  Alysia Penna, MD

## 2019-08-22 ENCOUNTER — Other Ambulatory Visit: Payer: Self-pay | Admitting: Family Medicine

## 2019-08-22 MED ORDER — GLIPIZIDE 10 MG PO TABS: 10.0000 mg | ORAL_TABLET | Freq: Two times a day (BID) | ORAL | 11 refills | Status: DC

## 2019-08-22 MED ORDER — METFORMIN HCL 1000 MG PO TABS: 1000.0000 mg | ORAL_TABLET | Freq: Two times a day (BID) | ORAL | 11 refills | Status: DC

## 2019-08-27 ENCOUNTER — Telehealth: Payer: Self-pay

## 2019-08-27 NOTE — Telephone Encounter (Signed)
Copied from [place] [id number]. Topic: Quick Communication - Lab Results (Clinic Use ONLY) >> Aug 22, 2019 11:51 AM Scherrie Gerlach wrote: Pt would like results of labs done 08/20/19

## 2019-08-28 NOTE — Telephone Encounter (Signed)
Spoke with patient he is aware of result.

## 2019-08-28 NOTE — Telephone Encounter (Signed)
See my Result Note  

## 2019-08-30 DIAGNOSIS — F5089 Other specified eating disorder: Secondary | ICD-10-CM | POA: Diagnosis not present

## 2019-08-30 NOTE — Telephone Encounter (Signed)
Notes were uploaded on [employer] sent . Pt is already scheduled for an appt      Copied from [place] [id number]. Topic: Quick Communication - See Telephone Encounter >> Aug 28, 2019  3:53 PM Loma Boston wrote: CRM for notification. See Telephone encounter for: 08/28/19. Amy from [facility] called re pt and sttates got the referral but there are no notes or anything. Please fax over additional supporting notes to [phone]

## 2019-09-04 DIAGNOSIS — H04123 Dry eye syndrome of bilateral lacrimal glands: Secondary | ICD-10-CM | POA: Diagnosis not present

## 2019-09-04 DIAGNOSIS — H40033 Anatomical narrow angle, bilateral: Secondary | ICD-10-CM | POA: Diagnosis not present

## 2019-09-04 DIAGNOSIS — H2513 Age-related nuclear cataract, bilateral: Secondary | ICD-10-CM | POA: Diagnosis not present

## 2019-09-04 DIAGNOSIS — E119 Type 2 diabetes mellitus without complications: Secondary | ICD-10-CM | POA: Diagnosis not present

## 2019-09-05 DIAGNOSIS — K625 Hemorrhage of anus and rectum: Secondary | ICD-10-CM | POA: Diagnosis not present

## 2019-09-19 ENCOUNTER — Other Ambulatory Visit: Payer: Self-pay | Admitting: Gastroenterology

## 2019-10-11 ENCOUNTER — Encounter (HOSPITAL_COMMUNITY): Payer: Self-pay | Admitting: *Deleted

## 2019-10-11 ENCOUNTER — Other Ambulatory Visit: Payer: Self-pay

## 2019-10-11 ENCOUNTER — Other Ambulatory Visit (HOSPITAL_COMMUNITY)
Admission: RE | Admit: 2019-10-11 | Discharge: 2019-10-11 | Disposition: A | Discharge status: Home / Self Care | Payer: BC Managed Care – PPO | Source: Ambulatory Visit | Attending: Gastroenterology | Admitting: Gastroenterology

## 2019-10-11 DIAGNOSIS — Z01812 Encounter for preprocedural laboratory examination: Secondary | ICD-10-CM | POA: Insufficient documentation

## 2019-10-11 DIAGNOSIS — Z20828 Contact with and (suspected) exposure to other viral communicable diseases: Secondary | ICD-10-CM | POA: Insufficient documentation

## 2019-10-11 NOTE — Progress Notes (Signed)
Pt aware to bring CPAP mask and tubing day of procedure

## 2019-10-14 LAB — NOVEL CORONAVIRUS, NAA (HOSP ORDER, SEND-OUT TO REF LAB; TAT 18-24 HRS): SARS-CoV-2, NAA: NOT DETECTED

## 2019-10-14 NOTE — Progress Notes (Signed)
Spoke with patient, has quarantined without symptoms, answered questions, arrival time is 0 tomorrow.

## 2019-10-15 ENCOUNTER — Encounter (HOSPITAL_COMMUNITY)
Admission: RE | Disposition: A | Discharge status: Home / Self Care | Payer: Self-pay | Source: Home / Self Care | Attending: Gastroenterology

## 2019-10-15 ENCOUNTER — Other Ambulatory Visit: Payer: Self-pay

## 2019-10-15 ENCOUNTER — Ambulatory Visit (HOSPITAL_COMMUNITY): Payer: BC Managed Care – PPO | Admitting: Certified Registered Nurse Anesthetist

## 2019-10-15 ENCOUNTER — Encounter (HOSPITAL_COMMUNITY): Payer: Self-pay | Admitting: *Deleted

## 2019-10-15 ENCOUNTER — Ambulatory Visit (HOSPITAL_COMMUNITY)
Admission: RE | Admit: 2019-10-15 | Discharge: 2019-10-15 | Disposition: A | Discharge status: Home / Self Care | Payer: BC Managed Care – PPO | Attending: Gastroenterology | Admitting: Gastroenterology

## 2019-10-15 DIAGNOSIS — K625 Hemorrhage of anus and rectum: Secondary | ICD-10-CM | POA: Insufficient documentation

## 2019-10-15 DIAGNOSIS — K648 Other hemorrhoids: Secondary | ICD-10-CM | POA: Diagnosis not present

## 2019-10-15 DIAGNOSIS — G4730 Sleep apnea, unspecified: Secondary | ICD-10-CM | POA: Diagnosis not present

## 2019-10-15 DIAGNOSIS — E119 Type 2 diabetes mellitus without complications: Secondary | ICD-10-CM | POA: Diagnosis not present

## 2019-10-15 DIAGNOSIS — E118 Type 2 diabetes mellitus with unspecified complications: Secondary | ICD-10-CM | POA: Diagnosis not present

## 2019-10-15 DIAGNOSIS — Z6841 Body Mass Index (BMI) 40.0 and over, adult: Secondary | ICD-10-CM | POA: Insufficient documentation

## 2019-10-15 DIAGNOSIS — Z7984 Long term (current) use of oral hypoglycemic drugs: Secondary | ICD-10-CM | POA: Diagnosis not present

## 2019-10-15 DIAGNOSIS — F419 Anxiety disorder, unspecified: Secondary | ICD-10-CM | POA: Diagnosis not present

## 2019-10-15 DIAGNOSIS — K219 Gastro-esophageal reflux disease without esophagitis: Secondary | ICD-10-CM | POA: Diagnosis not present

## 2019-10-15 HISTORY — PX: COLONOSCOPY WITH PROPOFOL: SHX5780

## 2019-10-15 LAB — GLUCOSE, CAPILLARY: Glucose-Capillary: 113 mg/dL — ABNORMAL HIGH (ref 70–99)

## 2019-10-15 SURGERY — COLONOSCOPY WITH PROPOFOL
Anesthesia: Monitor Anesthesia Care

## 2019-10-15 MED ORDER — LIDOCAINE HCL (CARDIAC) PF 100 MG/5ML IV SOSY
PREFILLED_SYRINGE | INTRAVENOUS | Status: DC | PRN
  Administered 2019-10-15: 100 mg via INTRAVENOUS

## 2019-10-15 MED ORDER — PROPOFOL 10 MG/ML IV BOLUS
INTRAVENOUS | Status: AC
  Filled 2019-10-15: qty 20

## 2019-10-15 MED ORDER — PROPOFOL 10 MG/ML IV BOLUS
INTRAVENOUS | Status: DC | PRN
  Administered 2019-10-15 (×3): 20 mg via INTRAVENOUS
  Administered 2019-10-15: 40 mg via INTRAVENOUS

## 2019-10-15 MED ORDER — PROPOFOL 500 MG/50ML IV EMUL
INTRAVENOUS | Status: DC | PRN
  Administered 2019-10-15: 100 ug/kg/min via INTRAVENOUS

## 2019-10-15 MED ORDER — PROPOFOL 500 MG/50ML IV EMUL
INTRAVENOUS | Status: AC
  Filled 2019-10-15: qty 50

## 2019-10-15 MED ORDER — SODIUM CHLORIDE 0.9 % IV SOLN: INTRAVENOUS | Status: DC

## 2019-10-15 MED ORDER — LACTATED RINGERS IV SOLN
INTRAVENOUS | Status: DC | PRN
  Administered 2019-10-15: 13:00:00 via INTRAVENOUS

## 2019-10-15 SURGICAL SUPPLY — 22 items

## 2019-10-15 NOTE — Anesthesia Postprocedure Evaluation (Signed)
Anesthesia Post Note  Patient: Gregory Cortez  Procedure(s) Performed: COLONOSCOPY WITH PROPOFOL (N/A )     Patient location during evaluation: PACU Anesthesia Type: MAC Level of consciousness: awake and alert Pain management: pain level controlled Vital Signs Assessment: post-procedure vital signs reviewed and stable Respiratory status: spontaneous breathing, nonlabored ventilation, respiratory function stable and patient connected to nasal cannula oxygen Cardiovascular status: stable and blood pressure returned to baseline Postop Assessment: no apparent nausea or vomiting Anesthetic complications: no    Last Vitals:  Vitals:   10/15/19 1331 10/15/19 1341  BP: 117/61 (!) 129/55  Pulse: 78 68  Resp: (!) 21 16  Temp: 37.1 C   SpO2: 96% 94%    Last Pain:  Vitals:   10/15/19 1341  TempSrc:   PainSc: 0-No pain                 Anarie Kalish DAVID

## 2019-10-15 NOTE — Anesthesia Preprocedure Evaluation (Signed)
Anesthesia Evaluation  Patient identified by MRN, date of birth, ID band Patient awake    Reviewed: Allergy & Precautions, NPO status , Patient's Chart, lab work & pertinent test results  Airway Mallampati: II  TM Distance: >3 FB Neck ROM: Full    Dental   Pulmonary sleep apnea ,    Pulmonary exam normal        Cardiovascular Normal cardiovascular exam     Neuro/Psych Anxiety    GI/Hepatic GERD  Medicated and Controlled,  Endo/Other  diabetes, Type 2, Oral Hypoglycemic AgentsMorbid obesity  Renal/GU      Musculoskeletal   Abdominal   Peds  Hematology   Anesthesia Other Findings   Reproductive/Obstetrics                             Anesthesia Physical Anesthesia Plan  ASA: III  Anesthesia Plan: MAC   Post-op Pain Management:    Induction: Intravenous  PONV Risk Score and Plan: 1 and Ondansetron  Airway Management Planned: Simple Face Mask  Additional Equipment:   Intra-op Plan:   Post-operative Plan:   Informed Consent: I have reviewed the patients History and Physical, chart, labs and discussed the procedure including the risks, benefits and alternatives for the proposed anesthesia with the patient or authorized representative who has indicated his/her understanding and acceptance.       Plan Discussed with: CRNA and Surgeon  Anesthesia Plan Comments:         Anesthesia Quick Evaluation

## 2019-10-15 NOTE — Transfer of Care (Signed)
Immediate Anesthesia Transfer of Care Note  Patient: Gregory Cortez  Procedure(s) Performed: COLONOSCOPY WITH PROPOFOL (N/A )  Patient Location: Endoscopy Unit  Anesthesia Type:MAC  Level of Consciousness: awake, alert , oriented and patient cooperative  Airway & Oxygen Therapy: Patient Spontanous Breathing and Patient connected to nasal cannula oxygen  Post-op Assessment: Report given to RN and Post -op Vital signs reviewed and stable  Post vital signs: Reviewed and stable  Last Vitals:  Vitals Value Taken Time  BP    Temp    Pulse    Resp    SpO2      Last Pain:  Vitals:   10/15/19 1227  TempSrc: Oral  PainSc: 0-No pain         Complications: No apparent anesthesia complications

## 2019-10-15 NOTE — H&P (Signed)
The patient is a 63 year old male here today for colonoscopy to evaluate rectal bleeding.  No change in history.  Physical: No distress heart regular, no respiratory problems, abdomen soft nontender  Impression rectal bleeding  Plan colonoscopy

## 2019-10-15 NOTE — Discharge Instructions (Signed)

## 2019-10-15 NOTE — Op Note (Signed)
[facility] Patient Name: Gregory Cortez Procedure Date: 10/15/2019 MRN: [id number] Attending MD: Wonda Horner , MD Date of Birth: [birth year] CSN: [id number] Age: 63 Admit Type: Outpatient Procedure:                Colonoscopy Indications:              Rectal bleeding Providers:                Wonda Horner, MD, Grace Isaac, RN, Marguerita Merles, Technician Referring MD:              Medicines:                Propofol per Anesthesia Complications:            No immediate complications. Estimated Blood Loss:     Estimated blood loss: none. Procedure:                Pre-Anesthesia Assessment:                           - Prior to the procedure, a History and Physical                            was performed, and patient medications and                            allergies were reviewed. The patient's tolerance of                            previous anesthesia was also reviewed. The risks                            and benefits of the procedure and the sedation                            options and risks were discussed with the patient.                            All questions were answered, and informed consent                            was obtained. Prior Anticoagulants: The patient was                            on Xeralto, last dose 3 days ago. ASA Grade                            Assessment: III - A patient with severe systemic                            disease. After reviewing the risks and benefits,  the patient was deemed in satisfactory condition to                            undergo the procedure.                           After obtaining informed consent, the colonoscope                            was passed under direct vision. Throughout the                            procedure, the patient's blood pressure, pulse, and                            oxygen saturations were monitored continuously. The                          CF-HQ190L ([id number]) Olympus colonoscope was                            introduced through the anus and advanced to the the                            cecum, identified by appendiceal orifice and                            ileocecal valve. The ileocecal valve, appendiceal                            orifice, and rectum were photographed. The                            colonoscopy was performed without difficulty. The                            patient tolerated the procedure well. The quality                            of the bowel preparation was good. Scope In: 1:12:56 PM Scope Out: 1:24:18 PM Scope Withdrawal Time: 0 hours 7 minutes 15 seconds  Total Procedure Duration: 0 hours 11 minutes 22 seconds  Findings:      The perianal and digital rectal examinations were normal.      Internal hemorrhoids were found during retroflexion. The hemorrhoids       were small.      The exam was otherwise without abnormality. Impression:               - Internal hemorrhoids.                           - The examination was otherwise normal.                           - No specimens collected. Moderate Sedation:      . Recommendation:           -  Resume regular diet.                           - Continue present medications.                           - Repeat colonoscopy in 10 years for screening                            purposes. Procedure Code(s):        --- Professional ---                           [phone] [id number], Colonoscopy, flexible; diagnostic, including                            collection of specimen(s) by brushing or washing,                            when performed (separate procedure) Diagnosis Code(s):        --- Professional ---                           K64.8, Other hemorrhoids                           K62.5, Hemorrhage of anus and rectum CPT copyright 2019 American Medical Association. All rights reserved. The codes documented in this report are preliminary and upon  coder review may  be revised to meet current compliance requirements. Graylin ShiverSalem F Jakiyah Stepney, MD 10/15/2019 1:28:58 PM This report has been signed electronically. Number of Addenda: 0

## 2019-10-16 ENCOUNTER — Encounter (HOSPITAL_COMMUNITY): Payer: Self-pay | Admitting: Gastroenterology

## 2019-10-22 NOTE — H&P (Signed)
Patient for colonoscopy to evaluate rectal bleeding.   No distress  Heart regular rhythm  Lungs clear.  Abdomen nontender  Impression rectal bleeding  Plan colonoscopy

## 2019-11-04 ENCOUNTER — Encounter: Payer: BC Managed Care – PPO | Attending: Surgery | Admitting: Skilled Nursing Facility1

## 2019-11-04 ENCOUNTER — Other Ambulatory Visit: Payer: Self-pay

## 2019-11-04 DIAGNOSIS — E669 Obesity, unspecified: Secondary | ICD-10-CM | POA: Diagnosis not present

## 2019-11-04 NOTE — Progress Notes (Signed)
Pre-Operative Nutrition Class:  Appt start time: 2574   End time:  1830.  Patient was seen on 11/04/2019 for Pre-Operative Bariatric Surgery [facility].   Surgery date:  Surgery type: sleeve Start weight at [facility]: 399.3 Weight today: 401  Samples given per MNT protocol. Patient educated on appropriate usage: Celebrate calcium Lot: [id number] Exp 11/21 The following the learning objectives were met by the patient during this course:  Identify Pre-Op Dietary Goals and will begin 2 weeks pre-operatively  Identify appropriate sources of fluids and proteins   State protein recommendations and appropriate sources pre and post-operatively  Identify Post-Operative Dietary Goals and will follow for 2 weeks post-operatively  Identify appropriate multivitamin and calcium sources  Describe the need for physical activity post-operatively and will follow MD recommendations  State when to call healthcare provider regarding medication questions or post-operative complications  Handouts given during class include:  Pre-Op Bariatric Surgery Diet Handout  Protein Shake Handout  Post-Op Bariatric Surgery Nutrition Handout  [facility] Program Information Flyer  Support Group Information Flyer  [facility] Bariatric Supplements Price List  Follow-Up Plan: Patient will follow-up at [facility] 2 weeks post operatively for diet advancement per MD.

## 2019-11-08 DIAGNOSIS — J3489 Other specified disorders of nose and nasal sinuses: Secondary | ICD-10-CM | POA: Diagnosis not present

## 2019-11-08 DIAGNOSIS — Z20828 Contact with and (suspected) exposure to other viral communicable diseases: Secondary | ICD-10-CM | POA: Diagnosis not present

## 2019-11-12 ENCOUNTER — Encounter: Payer: Self-pay | Admitting: Family Medicine

## 2019-11-12 ENCOUNTER — Other Ambulatory Visit: Payer: Self-pay

## 2019-11-12 ENCOUNTER — Telehealth (INDEPENDENT_AMBULATORY_CARE_PROVIDER_SITE_OTHER): Payer: BC Managed Care – PPO | Admitting: Family Medicine

## 2019-11-12 DIAGNOSIS — J019 Acute sinusitis, unspecified: Secondary | ICD-10-CM

## 2019-11-12 DIAGNOSIS — Z20822 Contact with and (suspected) exposure to covid-19: Secondary | ICD-10-CM

## 2019-11-12 MED ORDER — AZITHROMYCIN 250 MG PO TABS: ORAL_TABLET | ORAL | 0 refills | Status: DC

## 2019-11-12 NOTE — Progress Notes (Signed)
Virtual Visit via Video Note  I connected with the patient on 11/12/19 at 10:45 AM EST by a video enabled telemedicine application and verified that I am speaking with the correct person using two identifiers.  Location patient: home Location provider:work or home office Persons participating in the virtual visit: patient, provider  I discussed the limitations of evaluation and management by telemedicine and the availability of in person appointments. The patient expressed understanding and agreed to proceed.   HPI: Here for 4 days of sinus congestion, blowing yellow mucus from the nose, a dry cough, wheezing, and mild SOB. No chest pain or fever. No body aches or NVD. He is beginning to lose some taste and smell. He is drinking fluids.  In the past week his son, daughter, and granddaughter have all tested positive for the Covid-19 virus.    ROS: See pertinent positives and negatives per HPI.  Past Medical History:  Diagnosis Date  . Chest pain   . Diabetes mellitus type 2 in obese ([place]) 11/29/2016  . ED (erectile dysfunction)   . GERD (gastroesophageal reflux disease)    [facility]  . Hx of colonoscopy   . Low back pain    dr Carloyn Manner, dr Alfonso Ramus, dr Nelva Bush, herniated disc L4-5  . OSA (obstructive sleep apnea)    dr Gwenette Greet  . PE (pulmonary embolism)    bilateral sep 2011 and again bilateral May 2012  . Primary hypercoagulable state ([place])    no etiology found per Dr. Earlie Server   . Pulmonary embolism ([place]) 02/22/2010   CT angio Dx  . Testosterone deficiency    dr Gaynelle Arabian, shots every 2 weeks  . Thrombosis of arm    left arm 08/2010    Past Surgical History:  Procedure Laterality Date  . APPENDECTOMY    . cardiac stress test x 2     . COLONOSCOPY  08-19-09   per Dr. Penelope Coop, clear, repeat in 10 yrs   . COLONOSCOPY WITH PROPOFOL N/A 10/15/2019   Procedure: COLONOSCOPY WITH PROPOFOL;  Surgeon: Wonda Horner, MD;  Location: [facility];  Service: Endoscopy;  Laterality: N/A;  . esi      L4-5 dr Carloyn Manner 03/2010  . ESOPHAGOGASTRODUODENOSCOPY     x2 - normal except reflux  . KNEE SURGERY     both  . LAPAROSCOPIC APPENDECTOMY  09/13/2012   Procedure: APPENDECTOMY LAPAROSCOPIC;  Surgeon: Stark Klein, MD;  Location: [facility] ORS;  Service: General;  Laterality: N/A;  . ROTATOR CUFF REPAIR  08-20-10   left dr Para March complicated by PE  . SHOULDER SURGERY     right and left    Family History  Problem Relation Age of Onset  . Hypertension Other   . Cancer Other        lung  . Coronary artery disease Neg Hx      Current Outpatient Medications:  .  ALPRAZolam (XANAX) 1 MG tablet, Take 1 tablet (1 mg total) by mouth 3 (three) times daily as needed. (Patient taking differently: Take 1 mg by mouth 3 (three) times daily as needed for anxiety. ), Disp: 90 tablet, Rfl: 5 .  diphenhydramine-acetaminophen (TYLENOL PM) 25-500 MG TABS, Take 2 tablets by mouth at bedtime. , Disp: , Rfl:  .  folic acid (FOLVITE) 1 MG tablet, TAKE 1 TABLET BY MOUTH EVERY DAY (Patient taking differently: Take 1 mg by mouth daily. ), Disp: 90 tablet, Rfl: 8 .  glipiZIDE (GLUCOTROL) 10 MG tablet, Take 1 tablet (10 mg total) by  mouth 2 (two) times daily before a meal., Disp: 60 tablet, Rfl: 11 .  HYDROcodone-acetaminophen (NORCO) 10-325 MG tablet, Take 1 tablet by mouth 2 (two) times daily as needed (pain)., Disp: , Rfl:  .  metFORMIN (GLUCOPHAGE) 1000 MG tablet, Take 1 tablet (1,000 mg total) by mouth 2 (two) times daily with a meal., Disp: 60 tablet, Rfl: 11 .  methocarbamol (ROBAXIN) 500 MG tablet, Take 500 mg by mouth every 8 (eight) hours as needed for muscle spasms. , Disp: , Rfl: 0 .  Multiple Vitamin (MULTIVITAMIN WITH MINERALS) TABS tablet, Take 1 tablet by mouth once a week. , Disp: , Rfl:  .  omeprazole (PRILOSEC) 40 MG capsule, Take 1 capsule (40 mg total) by mouth daily., Disp: 90 capsule, Rfl: 3 .  XARELTO 20 MG TABS tablet, TAKE ONE TABLET BY MOUTH DAILY (Patient taking differently: Take 20 mg by mouth  daily. ), Disp: 60 tablet, Rfl: 1 .  azithromycin (ZITHROMAX Z-PAK) 250 MG tablet, As directed, Disp: 6 each, Rfl: 0 .  metFORMIN (GLUCOPHAGE) 500 MG tablet, Take 500 mg by mouth 2 (two) times daily., Disp: , Rfl:   EXAM:  VITALS per patient if applicable:  GENERAL: alert, oriented, appears well and in no acute distress  HEENT: atraumatic, conjunttiva clear, no obvious abnormalities on inspection of external nose and ears  NECK: normal movements of the head and neck  LUNGS: on inspection no signs of respiratory distress, breathing rate appears normal, no obvious gross SOB, gasping or wheezing  CV: no obvious cyanosis  MS: moves all visible extremities without noticeable abnormality  PSYCH/NEURO: pleasant and cooperative, no obvious depression or anxiety, speech and thought processing grossly intact  ASSESSMENT AND PLAN: He seems to have a sinusitis, but he likely has a Covid infection as well. We will treat him with a Zpack and he can add Mucinex prn. He will go to the [place] Covid testing site today.  [name], MD  Discussed the following assessment and plan:  No diagnosis found.     I discussed the assessment and treatment plan with the patient. The patient was provided an opportunity to ask questions and all were answered. The patient agreed with the plan and demonstrated an understanding of the instructions.   The patient was advised to call back or seek an in-person evaluation if the symptoms worsen or if the condition fails to improve as anticipated.

## 2019-11-13 LAB — NOVEL CORONAVIRUS, NAA: SARS-CoV-2, NAA: DETECTED — AB

## 2019-11-20 ENCOUNTER — Telehealth: Payer: Self-pay | Admitting: Family Medicine

## 2019-11-20 NOTE — Telephone Encounter (Signed)
Please advise 

## 2019-11-20 NOTE — Telephone Encounter (Signed)
See note

## 2019-11-20 NOTE — Telephone Encounter (Signed)
Dr Sarajane Jews or nurse   to call back COVID Positive last 12 days pt finished antibiotics Sarajane Jews gave on Saturday and now burning lungs every time takes a deep breath and a little SOB wants a cb for maybe steroids as to not get pneumonia, seems to be getting worse. Please FU by the end of day pt worried  [phone].

## 2019-11-21 MED ORDER — METHYLPREDNISOLONE 4 MG PO TBPK: ORAL_TABLET | ORAL | 0 refills | Status: DC

## 2019-11-21 NOTE — Telephone Encounter (Signed)
Pt notified that it was sent in

## 2019-11-21 NOTE — Telephone Encounter (Signed)
Call in a Medrol dose pack 4 mg  

## 2019-12-21 ENCOUNTER — Other Ambulatory Visit: Payer: Self-pay | Admitting: Family Medicine

## 2020-01-08 ENCOUNTER — Other Ambulatory Visit: Payer: Self-pay | Admitting: Family Medicine

## 2020-01-08 NOTE — Telephone Encounter (Signed)
Medication Refill - Medication: HYDROcodone-acetaminophen (NORCO) 10-325 MG tablet    Has the patient contacted their pharmacy? No. (Agent: If no, request that the patient contact the pharmacy for the refill.) (Agent: If yes, when and what did the pharmacy advise?)  Preferred Pharmacy (with phone number or street name):  [facility] [place], [place] - [address] Phone:  [phone]  Fax:  [phone]       Agent: Please be advised that RX refills may take up to 3 business days. We ask that you follow-up with your pharmacy.

## 2020-01-08 NOTE — Telephone Encounter (Signed)
Requested medication (s) are due for refill today:  Yes  Requested medication (s) are on the active medication list:  Yes  Future visit scheduled:  No  Last Refill: Historical Provider  Requested Prescriptions  Pending Prescriptions Disp Refills   HYDROcodone-acetaminophen (NORCO) 10-325 MG tablet 30 tablet     Sig: Take 1 tablet by mouth 2 (two) times daily as needed (pain).      Not Delegated - Analgesics:  Opioid Agonist Combinations Failed - 01/08/2020 12:07 PM      Failed - This refill cannot be delegated      Failed - Urine Drug Screen completed in last 360 days.      Passed - Valid encounter within last 6 months    Recent Outpatient Visits           1 month ago Acute sinusitis, recurrence not specified, unspecified location   [facility], Tera Mater, MD   4 months ago Preventative health care   [facility], Tera Mater, MD   7 months ago Chronic narcotic use   [facility], Tera Mater, MD   8 months ago Chronic bilateral low back pain with sciatica, sciatica laterality unspecified   [facility], Tera Mater, MD   1 year ago Chronic narcotic use   [facility], Tera Mater, MD

## 2020-01-08 NOTE — Telephone Encounter (Signed)
Last filled 07/21/2019 Last OV 11/12/2019  Ok to fill?

## 2020-01-11 ENCOUNTER — Other Ambulatory Visit: Payer: Self-pay | Admitting: Family Medicine

## 2020-01-21 ENCOUNTER — Other Ambulatory Visit: Payer: Self-pay

## 2020-01-22 ENCOUNTER — Ambulatory Visit: Payer: BC Managed Care – PPO | Admitting: Family Medicine

## 2020-01-27 ENCOUNTER — Other Ambulatory Visit: Payer: Self-pay

## 2020-01-28 ENCOUNTER — Ambulatory Visit (INDEPENDENT_AMBULATORY_CARE_PROVIDER_SITE_OTHER): Payer: BC Managed Care – PPO | Admitting: Family Medicine

## 2020-01-28 ENCOUNTER — Encounter: Payer: Self-pay | Admitting: Family Medicine

## 2020-01-28 VITALS — BP 160/80 | HR 99 | Temp 98.0°F | Wt >= 6400 oz

## 2020-01-28 DIAGNOSIS — F1190 Opioid use, unspecified, uncomplicated: Secondary | ICD-10-CM | POA: Diagnosis not present

## 2020-01-28 DIAGNOSIS — M25561 Pain in right knee: Secondary | ICD-10-CM

## 2020-01-28 DIAGNOSIS — M5440 Lumbago with sciatica, unspecified side: Secondary | ICD-10-CM | POA: Diagnosis not present

## 2020-01-28 DIAGNOSIS — M25562 Pain in left knee: Secondary | ICD-10-CM

## 2020-01-28 DIAGNOSIS — G8929 Other chronic pain: Secondary | ICD-10-CM | POA: Diagnosis not present

## 2020-01-28 MED ORDER — OXYCODONE-ACETAMINOPHEN 10-325 MG PO TABS: 1.0000 | ORAL_TABLET | Freq: Four times a day (QID) | ORAL | 0 refills | Status: AC | PRN

## 2020-01-28 MED ORDER — OXYCODONE-ACETAMINOPHEN 10-325 MG PO TABS: 1.0000 | ORAL_TABLET | Freq: Four times a day (QID) | ORAL | 0 refills | Status: DC | PRN

## 2020-01-28 NOTE — Progress Notes (Signed)
   Subjective:    Patient ID: Gregory Cortez, male    DOB: 02/22/1956, 64 y.o.   MRN: [id number]  HPI Here for pain management. He is struggling with pain issues. He has been taking Norco 10-325 but this no longer gives him much pain relief. His low back pain remains about the same, but his bilateral knee pains are getting worse. He has gained more weight and he knows this puts more stress on his knees. He is waiting to have bariatric surgery, this has been postponed until April or May due to the virus pandemic.  Indication for chronic opioid: low back and knee pain  Medication and dose: Percocet 10-325 # pills per month: 120 Last UDS date: 01-28-20 Opioid Treatment Agreement signed (Y/N): 01-28-20 Opioid Treatment Agreement last reviewed with patient:  01-28-20 NCCSRS reviewed this encounter (include red flags): Yes    Review of Systems     Objective:   Physical Exam        Assessment & Plan:  Pain management. We agreed to change from Norco to Percocet 10-325 every 6 hours prn. He knows to try to lose as much weight as he can. [name], MD

## 2020-01-29 LAB — PAIN MGMT, PROFILE 8 W/CONF, U
6 Acetylmorphine: NEGATIVE ng/mL
Alcohol Metabolites: NEGATIVE ng/mL (ref <500)
Amphetamines: NEGATIVE ng/mL
Benzodiazepines: NEGATIVE ng/mL
Buprenorphine, Urine: NEGATIVE ng/mL
Cocaine Metabolite: NEGATIVE ng/mL
Creatinine: 143.5 mg/dL
MDMA: NEGATIVE ng/mL
Marijuana Metabolite: NEGATIVE ng/mL
Opiates: NEGATIVE ng/mL
Oxidant: NEGATIVE ug/mL
Oxycodone: NEGATIVE ng/mL
pH: 5.3 (ref 4.5–9.0)

## 2020-02-26 ENCOUNTER — Other Ambulatory Visit: Payer: Self-pay | Admitting: Family Medicine

## 2020-03-12 ENCOUNTER — Telehealth: Payer: Self-pay | Admitting: Family Medicine

## 2020-03-12 NOTE — Telephone Encounter (Signed)
Pt call and want a refill call in for his Viagra at  [facility] [place], [place] - [address] Phone:  [phone]  Fax:  [phone]

## 2020-03-12 NOTE — Telephone Encounter (Signed)
Okay for refill? Please advise 

## 2020-03-13 MED ORDER — SILDENAFIL CITRATE 100 MG PO TABS: 100.0000 mg | ORAL_TABLET | Freq: Every day | ORAL | 3 refills | Status: DC | PRN

## 2020-03-13 NOTE — Telephone Encounter (Signed)
Done

## 2020-04-23 ENCOUNTER — Other Ambulatory Visit: Payer: Self-pay | Admitting: Family Medicine

## 2020-04-29 ENCOUNTER — Other Ambulatory Visit: Payer: Self-pay | Admitting: Family Medicine

## 2020-06-24 ENCOUNTER — Telehealth (INDEPENDENT_AMBULATORY_CARE_PROVIDER_SITE_OTHER): Payer: BC Managed Care – PPO | Admitting: Family Medicine

## 2020-06-24 ENCOUNTER — Encounter: Payer: Self-pay | Admitting: Family Medicine

## 2020-06-24 ENCOUNTER — Telehealth: Payer: Self-pay | Admitting: Family Medicine

## 2020-06-24 ENCOUNTER — Other Ambulatory Visit: Payer: Self-pay | Admitting: Family Medicine

## 2020-06-24 VITALS — Ht 70.0 in | Wt >= 6400 oz

## 2020-06-24 DIAGNOSIS — F1190 Opioid use, unspecified, uncomplicated: Secondary | ICD-10-CM | POA: Diagnosis not present

## 2020-06-24 DIAGNOSIS — M5442 Lumbago with sciatica, left side: Secondary | ICD-10-CM

## 2020-06-24 DIAGNOSIS — M5440 Lumbago with sciatica, unspecified side: Secondary | ICD-10-CM

## 2020-06-24 DIAGNOSIS — G8929 Other chronic pain: Secondary | ICD-10-CM | POA: Diagnosis not present

## 2020-06-24 DIAGNOSIS — M25562 Pain in left knee: Secondary | ICD-10-CM

## 2020-06-24 DIAGNOSIS — M25561 Pain in right knee: Secondary | ICD-10-CM

## 2020-06-24 MED ORDER — OXYCODONE-ACETAMINOPHEN 10-325 MG PO TABS: 1.0000 | ORAL_TABLET | Freq: Four times a day (QID) | ORAL | 0 refills | Status: AC | PRN

## 2020-06-24 NOTE — Telephone Encounter (Signed)
Patient needs a Rx refill on Oxycodone  I did not see this listed in his medication list  [facility] [address] [place], [place] - [address] Phone:  [phone]  Fax:  [phone]

## 2020-06-24 NOTE — Telephone Encounter (Signed)
Pt has been scheduled for visit.  

## 2020-06-24 NOTE — Progress Notes (Signed)
Subjective:    Patient ID: Gregory Cortez, male    DOB: [birth year], 64 y.o.   MRN: [id number]  HPI Virtual Visit via Video Note  I connected with the patient on 06/24/20 at  4:15 PM EDT by a video enabled telemedicine application and verified that I am speaking with the correct person using two identifiers.  Location patient: home Location provider:work or home office Persons participating in the virtual visit: patient, provider  I discussed the limitations of evaluation and management by telemedicine and the availability of in person appointments. The patient expressed understanding and agreed to proceed.   HPI: Here for pain  Management, he is doing well. He stays in touch with [facility] about a possible bariatric surgery, but they have been putting this off for awhile. He still plans to have this done when possible.  Indication for chronic opioid: low back and knee pain Medication and dose: Percocet 10-325 # pills per month: 120 Last UDS date: 01-28-20 Opioid Treatment Agreement signed (Y/N): 01-28-20 Opioid Treatment Agreement last reviewed with patient:  7-721 NCCSRS reviewed this encounter (include red flags): Yes    ROS: See pertinent positives and negatives per HPI.  Past Medical History:  Diagnosis Date  . Chest pain   . Diabetes mellitus type 2 in obese (HCC) 11/29/2016  . ED (erectile dysfunction)   . GERD (gastroesophageal reflux disease)    [facility]  . Hx of colonoscopy   . Low back pain    dr Channing Mutters, dr Farris Has, dr Ethelene Hal, herniated disc L4-5  . OSA (obstructive sleep apnea)    dr Shelle Iron  . PE (pulmonary embolism)    bilateral sep 2011 and again bilateral May 2012  . Primary hypercoagulable state (HCC)    no etiology found per Dr. Shirline Frees   . Pulmonary embolism (HCC) 02/22/2010   CT angio Dx  . Testosterone deficiency    dr Patsi Sears, shots every 2 weeks  . Thrombosis of arm    left arm 08/2010    Past Surgical History:  Procedure Laterality  Date  . APPENDECTOMY    . cardiac stress test x 2     . COLONOSCOPY  08-19-09   per Dr. Evette Cristal, clear, repeat in 10 yrs   . COLONOSCOPY WITH PROPOFOL N/A 10/15/2019   Procedure: COLONOSCOPY WITH PROPOFOL;  Surgeon: Graylin Shiver, MD;  Location: [facility];  Service: Endoscopy;  Laterality: N/A;  . esi     L4-5 dr Channing Mutters 03/2010  . ESOPHAGOGASTRODUODENOSCOPY     x2 - normal except reflux  . KNEE SURGERY     both  . LAPAROSCOPIC APPENDECTOMY  09/13/2012   Procedure: APPENDECTOMY LAPAROSCOPIC;  Surgeon: Almond Lint, MD;  Location: [facility];  Service: General;  Laterality: N/A;  . ROTATOR CUFF REPAIR  08-20-10   left dr Wyline Mood complicated by PE  . SHOULDER SURGERY     right and left    Family History  Problem Relation Age of Onset  . Hypertension Other   . Cancer Other        lung  . Coronary artery disease Neg Hx      Current Outpatient Medications:  .  ALPRAZolam (XANAX) 1 MG tablet, Take 1 tablet (1 mg total) by mouth 3 (three) times daily as needed. (Patient taking differently: Take 1 mg by mouth 3 (three) times daily as needed for anxiety. ), Disp: 90 tablet, Rfl: 5 .  azithromycin (ZITHROMAX Z-PAK) 250 MG tablet, As directed, Disp: 6 each, Rfl:  0 .  diphenhydramine-acetaminophen (TYLENOL PM) 25-500 MG TABS, Take 2 tablets by mouth at bedtime. , Disp: , Rfl:  .  folic acid (FOLVITE) 1 MG tablet, TAKE 1 TABLET BY MOUTH EVERY DAY (Patient taking differently: Take 1 mg by mouth daily. ), Disp: 90 tablet, Rfl: 8 .  glipiZIDE (GLUCOTROL) 10 MG tablet, Take 1 tablet (10 mg total) by mouth 2 (two) times daily before a meal., Disp: 60 tablet, Rfl: 11 .  metFORMIN (GLUCOPHAGE) 1000 MG tablet, Take 1 tablet (1,000 mg total) by mouth 2 (two) times daily with a meal., Disp: 60 tablet, Rfl: 11 .  metFORMIN (GLUCOPHAGE) 500 MG tablet, Take 500 mg by mouth 2 (two) times daily., Disp: , Rfl:  .  methocarbamol (ROBAXIN) 500 MG tablet, Take 500 mg by mouth every 8 (eight) hours as needed for muscle  spasms. , Disp: , Rfl: 0 .  Multiple Vitamin (MULTIVITAMIN WITH MINERALS) TABS tablet, Take 1 tablet by mouth once a week. , Disp: , Rfl:  .  omeprazole (PRILOSEC) 40 MG capsule, Take 1 capsule (40 mg total) by mouth daily., Disp: 90 capsule, Rfl: 3 .  sildenafil (VIAGRA) 100 MG tablet, Take 1 tablet (100 mg total) by mouth daily as needed for erectile dysfunction., Disp: 30 tablet, Rfl: 3 .  XARELTO 20 MG TABS tablet, Take 1 tablet by mouth once daily, Disp: 30 tablet, Rfl: 0 .  oxyCODONE-acetaminophen (PERCOCET) 10-325 MG tablet, Take 1 tablet by mouth every 6 (six) hours as needed for pain., Disp: 120 tablet, Rfl: 0  EXAM:  VITALS per patient if applicable:  GENERAL: alert, oriented, appears well and in no acute distress  HEENT: atraumatic, conjunttiva clear, no obvious abnormalities on inspection of external nose and ears  NECK: normal movements of the head and neck  LUNGS: on inspection no signs of respiratory distress, breathing rate appears normal, no obvious gross SOB, gasping or wheezing  CV: no obvious cyanosis  MS: moves all visible extremities without noticeable abnormality  PSYCH/NEURO: pleasant and cooperative, no obvious depression or anxiety, speech and thought processing grossly intact  ASSESSMENT AND PLAN: Pain management, meds were refilled.  [name], MD  Discussed the following assessment and plan:  No diagnosis found.     I discussed the assessment and treatment plan with the patient. The patient was provided an opportunity to ask questions and all were answered. The patient agreed with the plan and demonstrated an understanding of the instructions.   The patient was advised to call back or seek an in-person evaluation if the symptoms worsen or if the condition fails to improve as anticipated.     Review of Systems     Objective:   Physical Exam        Assessment & Plan:

## 2020-06-29 ENCOUNTER — Telehealth: Payer: BC Managed Care – PPO | Admitting: Family Medicine

## 2020-07-02 ENCOUNTER — Encounter: Payer: Self-pay | Admitting: Family Medicine

## 2020-07-02 ENCOUNTER — Telehealth (INDEPENDENT_AMBULATORY_CARE_PROVIDER_SITE_OTHER): Payer: BC Managed Care – PPO | Admitting: Family Medicine

## 2020-07-02 DIAGNOSIS — E1169 Type 2 diabetes mellitus with other specified complication: Secondary | ICD-10-CM | POA: Diagnosis not present

## 2020-07-02 DIAGNOSIS — E669 Obesity, unspecified: Secondary | ICD-10-CM

## 2020-07-02 DIAGNOSIS — E291 Testicular hypofunction: Secondary | ICD-10-CM | POA: Diagnosis not present

## 2020-07-02 DIAGNOSIS — E039 Hypothyroidism, unspecified: Secondary | ICD-10-CM | POA: Insufficient documentation

## 2020-07-02 MED ORDER — TESTOSTERONE CYPIONATE 200 MG/ML IM SOLN: 100.0000 mg | INTRAMUSCULAR | 5 refills | Status: DC

## 2020-07-02 MED ORDER — RIVAROXABAN 20 MG PO TABS: 20.0000 mg | ORAL_TABLET | Freq: Every day | ORAL | 3 refills | Status: DC

## 2020-07-02 MED ORDER — LEVOTHYROXINE SODIUM 75 MCG PO TABS: 75.0000 ug | ORAL_TABLET | Freq: Every day | ORAL | 3 refills | Status: DC

## 2020-07-02 NOTE — Progress Notes (Signed)
Subjective:    Patient ID: Gregory Cortez, male    DOB: Mar 21, 1956, 64 y.o.   MRN: [id number]  HPI Virtual Visit via Video Note  I connected with the patient on 07/02/20 at  8:45 AM EDT by a video enabled telemedicine application and verified that I am speaking with the correct person using two identifiers.  Location patient: home Location provider:work or home office Persons participating in the virtual visit: patient, provider  I discussed the limitations of evaluation and management by telemedicine and the availability of in person appointments. The patient expressed understanding and agreed to proceed.   HPI: Here to discuss lab results from [facility] that he had drawn on 03-24-20. These are notable for an A1c of 6.2, a low free T4 of 0.79, and a low testosterone at 107. We have already discussed his desire to get back on testosterone replacement. Even though he has a hx of pulmonary emboli, he is anticoagulated now with Xarelto and I think this will negate any risks of further blood clots. He also agrees to start on thyroid replacement.    ROS: See pertinent positives and negatives per HPI.  Past Medical History:  Diagnosis Date  . Chest pain   . Diabetes mellitus type 2 in obese (HCC) 11/29/2016  . ED (erectile dysfunction)   . GERD (gastroesophageal reflux disease)    [facility]  . Hx of colonoscopy   . Low back pain    dr Channing Mutters, dr Farris Has, dr Ethelene Hal, herniated disc L4-5  . OSA (obstructive sleep apnea)    dr Shelle Iron  . PE (pulmonary embolism)    bilateral sep 2011 and again bilateral May 2012  . Primary hypercoagulable state (HCC)    no etiology found per Dr. Shirline Frees   . Pulmonary embolism (HCC) 02/22/2010   CT angio Dx  . Testosterone deficiency    dr Patsi Sears, shots every 2 weeks  . Thrombosis of arm    left arm 08/2010    Past Surgical History:  Procedure Laterality Date  . APPENDECTOMY    . cardiac stress test x 2     . COLONOSCOPY  08-19-09   per Dr. Evette Cristal, clear,  repeat in 10 yrs   . COLONOSCOPY WITH PROPOFOL N/A 10/15/2019   Procedure: COLONOSCOPY WITH PROPOFOL;  Surgeon: Graylin Shiver, MD;  Location: [facility];  Service: Endoscopy;  Laterality: N/A;  . esi     L4-5 dr Channing Mutters 03/2010  . ESOPHAGOGASTRODUODENOSCOPY     x2 - normal except reflux  . KNEE SURGERY     both  . LAPAROSCOPIC APPENDECTOMY  09/13/2012   Procedure: APPENDECTOMY LAPAROSCOPIC;  Surgeon: Almond Lint, MD;  Location: [facility];  Service: General;  Laterality: N/A;  . ROTATOR CUFF REPAIR  08-20-10   left dr Wyline Mood complicated by PE  . SHOULDER SURGERY     right and left    Family History  Problem Relation Age of Onset  . Hypertension Other   . Cancer Other        lung  . Coronary artery disease Neg Hx      Current Outpatient Medications:  .  ALPRAZolam (XANAX) 1 MG tablet, Take 1 tablet (1 mg total) by mouth 3 (three) times daily as needed. (Patient taking differently: Take 1 mg by mouth 3 (three) times daily as needed for anxiety. ), Disp: 90 tablet, Rfl: 5 .  azithromycin (ZITHROMAX Z-PAK) 250 MG tablet, As directed, Disp: 6 each, Rfl: 0 .  diphenhydramine-acetaminophen (TYLENOL PM) 25-500  MG TABS, Take 2 tablets by mouth at bedtime. , Disp: , Rfl:  .  folic acid (FOLVITE) 1 MG tablet, TAKE 1 TABLET BY MOUTH EVERY DAY (Patient taking differently: Take 1 mg by mouth daily. ), Disp: 90 tablet, Rfl: 8 .  glipiZIDE (GLUCOTROL) 10 MG tablet, Take 1 tablet (10 mg total) by mouth 2 (two) times daily before a meal., Disp: 60 tablet, Rfl: 11 .  levothyroxine (SYNTHROID) 75 MCG tablet, Take 1 tablet (75 mcg total) by mouth daily., Disp: 90 tablet, Rfl: 3 .  metFORMIN (GLUCOPHAGE) 1000 MG tablet, Take 1 tablet (1,000 mg total) by mouth 2 (two) times daily with a meal., Disp: 60 tablet, Rfl: 11 .  metFORMIN (GLUCOPHAGE) 500 MG tablet, Take 500 mg by mouth 2 (two) times daily., Disp: , Rfl:  .  methocarbamol (ROBAXIN) 500 MG tablet, Take 500 mg by mouth every 8 (eight) hours as needed for  muscle spasms. , Disp: , Rfl: 0 .  Multiple Vitamin (MULTIVITAMIN WITH MINERALS) TABS tablet, Take 1 tablet by mouth once a week. , Disp: , Rfl:  .  omeprazole (PRILOSEC) 40 MG capsule, Take 1 capsule (40 mg total) by mouth daily., Disp: 90 capsule, Rfl: 3 .  oxyCODONE-acetaminophen (PERCOCET) 10-325 MG tablet, Take 1 tablet by mouth every 6 (six) hours as needed for pain., Disp: 120 tablet, Rfl: 0 .  rivaroxaban (XARELTO) 20 MG TABS tablet, Take 1 tablet (20 mg total) by mouth daily., Disp: 90 tablet, Rfl: 3 .  sildenafil (VIAGRA) 100 MG tablet, Take 1 tablet (100 mg total) by mouth daily as needed for erectile dysfunction., Disp: 30 tablet, Rfl: 3 .  testosterone cypionate (DEPOTESTOSTERONE CYPIONATE) 200 MG/ML injection, Inject 0.5 mLs (100 mg total) into the muscle 2 (two) times a week., Disp: 10 mL, Rfl: 5  EXAM:  VITALS per patient if applicable:  GENERAL: alert, oriented, appears well and in no acute distress  HEENT: atraumatic, conjunttiva clear, no obvious abnormalities on inspection of external nose and ears  NECK: normal movements of the head and neck  LUNGS: on inspection no signs of respiratory distress, breathing rate appears normal, no obvious gross SOB, gasping or wheezing  CV: no obvious cyanosis  MS: moves all visible extremities without noticeable abnormality  PSYCH/NEURO: pleasant and cooperative, no obvious depression or anxiety, speech and thought processing grossly intact  ASSESSMENT AND PLAN: New onset hypothyroidism, he will start taking Synthroid 75 mcg daily. He will also begin taking testosterone cypionate at 0.5 ml twice weekly. We will recheck a thyroid panel and a testosterone level in 90 days.  [name], MD  Discussed the following assessment and plan:  No diagnosis found.     I discussed the assessment and treatment plan with the patient. The patient was provided an opportunity to ask questions and all were answered. The patient agreed with the  plan and demonstrated an understanding of the instructions.   The patient was advised to call back or seek an in-person evaluation if the symptoms worsen or if the condition fails to improve as anticipated.      Review of Systems     Objective:   Physical Exam        Assessment & Plan:

## 2020-07-13 ENCOUNTER — Telehealth: Payer: Self-pay | Admitting: Family Medicine

## 2020-07-13 NOTE — Telephone Encounter (Signed)
Pt call and stated he need  A prescription some needle sent to  [facility] [address] [place], [place] - [address] Phone:  [phone]  Fax:  [phone]

## 2020-07-14 MED ORDER — "BD SYRINGE/NEEDLE 22G X 1-1/2"" 3 ML MISC": 3 refills | Status: AC

## 2020-07-14 NOTE — Telephone Encounter (Signed)
Spoke with the patient. The needles he needs are for testosterone. Rx sent in.

## 2020-07-23 DIAGNOSIS — G4733 Obstructive sleep apnea (adult) (pediatric): Secondary | ICD-10-CM | POA: Diagnosis not present

## 2020-07-23 DIAGNOSIS — Z7901 Long term (current) use of anticoagulants: Secondary | ICD-10-CM | POA: Diagnosis not present

## 2020-07-23 DIAGNOSIS — Z9989 Dependence on other enabling machines and devices: Secondary | ICD-10-CM | POA: Diagnosis not present

## 2020-07-28 ENCOUNTER — Other Ambulatory Visit: Payer: Self-pay | Admitting: Family Medicine

## 2020-07-28 NOTE — Telephone Encounter (Signed)
[facility] will be sending over a script and it need to be a quick turnaround due to the pt only had 2 day worth of medication (XARELTO 20 MG) and the pharmacy will overnight him the medication pt does not want to run out of the medication so that he does not get any blood clots.

## 2020-07-29 NOTE — Telephone Encounter (Signed)
Pt called back with phone number to [facility] mail order pharmacy. [phone].

## 2020-07-29 NOTE — Telephone Encounter (Signed)
Spoke with the patient. This was supposed to go to [facility] order pharmacy. There are multiple [facility] in our system. The patient will call back with the information to make sure we get the correct pharmacy put in.

## 2020-07-29 NOTE — Telephone Encounter (Signed)
I am not sure what the problem is, since I gave him a year's worth of Xarelto just a few weeks ago. Please straighten this out with the pharmacy

## 2020-07-30 MED ORDER — RIVAROXABAN 20 MG PO TABS: 20.0000 mg | ORAL_TABLET | Freq: Every day | ORAL | 3 refills | Status: DC

## 2020-07-30 NOTE — Telephone Encounter (Signed)
Rx has been sent in. Patient is aware. Nothing further needed.

## 2020-07-30 NOTE — Telephone Encounter (Signed)
I can not find this pharmacy in our system. Will call the pharmacy.

## 2020-08-25 ENCOUNTER — Telehealth: Payer: Self-pay | Admitting: Family Medicine

## 2020-08-25 NOTE — Telephone Encounter (Signed)
Please advise. omeprazole last filled 07/02/2018 Robaxin last filled 06/05/2015  Should the patient continue these?

## 2020-08-25 NOTE — Telephone Encounter (Signed)
pt requesting a refill request omeprazole (PRILOSEC) 40 MG capsule, methocarbamol (ROBAXIN) 500 MG tablet [facility] [address] [place], [place] - [address]  Phone:  [phone] Fax:  [phone]

## 2020-08-26 MED ORDER — METHOCARBAMOL 500 MG PO TABS: 500.0000 mg | ORAL_TABLET | Freq: Three times a day (TID) | ORAL | 3 refills | Status: DC | PRN

## 2020-08-26 NOTE — Telephone Encounter (Signed)
Please refill both for one year  

## 2020-09-01 ENCOUNTER — Ambulatory Visit: Payer: BC Managed Care – PPO | Admitting: Skilled Nursing Facility1

## 2020-09-03 ENCOUNTER — Other Ambulatory Visit: Payer: Self-pay | Admitting: Family Medicine

## 2020-09-03 MED ORDER — OMEPRAZOLE 40 MG PO CPDR: 40.0000 mg | DELAYED_RELEASE_CAPSULE | Freq: Every day | ORAL | 3 refills | Status: DC

## 2020-09-03 NOTE — Telephone Encounter (Signed)
Refill sent.  Last OV:07/02/20 Last refill:07/02/18

## 2020-09-03 NOTE — Telephone Encounter (Signed)
Pt is calling to say he needs his medication   omeprazole (PRILOSEC) 40 MG capsule   Refilled and sent to   [facility] [address], [place] - [address]  [address], [place] [address]  Phone:  [phone] Fax:  [phone]

## 2020-10-23 ENCOUNTER — Telehealth: Payer: Self-pay | Admitting: *Deleted

## 2020-10-23 NOTE — Telephone Encounter (Signed)
Patient called requesting his xanax to be filled as soon as possible. Patient states he doesn't like to fly and he is flying tonight for his father n law's funeral, patietn would like to have this before he leaves    [employer]

## 2020-10-26 MED ORDER — ALPRAZOLAM 1 MG PO TABS: 1.0000 mg | ORAL_TABLET | Freq: Three times a day (TID) | ORAL | 2 refills | Status: DC | PRN

## 2020-10-26 NOTE — Addendum Note (Signed)
Addended by: Kern Reap B on: 10/26/2020 12:26 PM   Modules accepted: Orders

## 2020-10-26 NOTE — Addendum Note (Signed)
Addended by: [name] A on: 10/26/2020 12:49 PM   Modules accepted: Orders

## 2020-10-26 NOTE — Telephone Encounter (Signed)
Done

## 2020-11-06 ENCOUNTER — Other Ambulatory Visit: Payer: Self-pay | Admitting: Family Medicine

## 2020-11-29 ENCOUNTER — Other Ambulatory Visit: Payer: Self-pay | Admitting: Hematology & Oncology

## 2020-12-17 ENCOUNTER — Telehealth: Payer: Self-pay | Admitting: Family Medicine

## 2020-12-17 NOTE — Telephone Encounter (Signed)
Patient is calling and is requesting to change glipiZIDE (GLUCOTROL) 10 MG tablet to Ozempic, please advise. CB is [phone]

## 2020-12-24 NOTE — Telephone Encounter (Signed)
Spoke with the pt and informed him of the message below.  Appt scheduled for 12/28/2020 to arrive at 8:15am.

## 2020-12-24 NOTE — Telephone Encounter (Signed)
He needs an in person OV to discuss this, get labs, etc

## 2020-12-28 ENCOUNTER — Encounter: Payer: Self-pay | Admitting: Family Medicine

## 2020-12-28 ENCOUNTER — Other Ambulatory Visit: Payer: Self-pay

## 2020-12-28 ENCOUNTER — Ambulatory Visit (INDEPENDENT_AMBULATORY_CARE_PROVIDER_SITE_OTHER): Payer: BC Managed Care – PPO | Admitting: Family Medicine

## 2020-12-28 VITALS — BP 148/84 | HR 72 | Temp 98.4°F | Ht 70.0 in | Wt 399.6 lb

## 2020-12-28 DIAGNOSIS — E291 Testicular hypofunction: Secondary | ICD-10-CM

## 2020-12-28 DIAGNOSIS — F1190 Opioid use, unspecified, uncomplicated: Secondary | ICD-10-CM | POA: Diagnosis not present

## 2020-12-28 DIAGNOSIS — E039 Hypothyroidism, unspecified: Secondary | ICD-10-CM | POA: Diagnosis not present

## 2020-12-28 DIAGNOSIS — E1169 Type 2 diabetes mellitus with other specified complication: Secondary | ICD-10-CM

## 2020-12-28 DIAGNOSIS — E669 Obesity, unspecified: Secondary | ICD-10-CM | POA: Diagnosis not present

## 2020-12-28 LAB — CBC WITH DIFFERENTIAL/PLATELET
Basophils Absolute: 0.1 10*3/uL (ref 0.0–0.1)
Basophils Relative: 1.1 % (ref 0.0–3.0)
Eosinophils Absolute: 0.2 10*3/uL (ref 0.0–0.7)
Eosinophils Relative: 2.7 % (ref 0.0–5.0)
HCT: 43.2 % (ref 39.0–52.0)
Hemoglobin: 14.4 g/dL (ref 13.0–17.0)
Lymphocytes Relative: 26.6 % (ref 12.0–46.0)
Lymphs Abs: 1.5 10*3/uL (ref 0.7–4.0)
MCHC: 33.4 g/dL (ref 30.0–36.0)
MCV: 93 fl (ref 78.0–100.0)
Monocytes Absolute: 0.5 10*3/uL (ref 0.1–1.0)
Monocytes Relative: 8.8 % (ref 3.0–12.0)
Neutro Abs: 3.4 10*3/uL (ref 1.4–7.7)
Neutrophils Relative %: 60.8 % (ref 43.0–77.0)
Platelets: 216 10*3/uL (ref 150.0–400.0)
RBC: 4.64 Mil/uL (ref 4.22–5.81)
RDW: 13.8 % (ref 11.5–15.5)
WBC: 5.6 10*3/uL (ref 4.0–10.5)

## 2020-12-28 LAB — BASIC METABOLIC PANEL
BUN: 11 mg/dL (ref 6–23)
CO2: 27 mEq/L (ref 19–32)
Calcium: 9.2 mg/dL (ref 8.4–10.5)
Chloride: 106 mEq/L (ref 96–112)
Creatinine, Ser: 0.8 mg/dL (ref 0.40–1.50)
GFR: 95.1 mL/min (ref >60.00)
Glucose, Bld: 163 mg/dL — ABNORMAL HIGH (ref 70–99)
Potassium: 4.5 mEq/L (ref 3.5–5.1)
Sodium: 142 mEq/L (ref 135–145)

## 2020-12-28 LAB — LIPID PANEL
Cholesterol: 155 mg/dL (ref 0–200)
HDL: 21.8 mg/dL — ABNORMAL LOW (ref >39.00)
LDL Cholesterol: 94 mg/dL (ref 0–99)
NonHDL: 133.48
Total CHOL/HDL Ratio: 7
Triglycerides: 195 mg/dL — ABNORMAL HIGH (ref 0.0–149.0)
VLDL: 39 mg/dL (ref 0.0–40.0)

## 2020-12-28 LAB — PSA: PSA: 0.8 ng/mL (ref 0.10–4.00)

## 2020-12-28 LAB — TSH: TSH: 2.5 u[IU]/mL (ref 0.35–4.50)

## 2020-12-28 LAB — HEPATIC FUNCTION PANEL
ALT: 23 U/L (ref 0–53)
AST: 18 U/L (ref 0–37)
Albumin: 4.2 g/dL (ref 3.5–5.2)
Alkaline Phosphatase: 67 U/L (ref 39–117)
Bilirubin, Direct: 0.1 mg/dL (ref 0.0–0.3)
Total Bilirubin: 0.6 mg/dL (ref 0.2–1.2)
Total Protein: 6.9 g/dL (ref 6.0–8.3)

## 2020-12-28 LAB — T4, FREE: Free T4: 0.62 ng/dL (ref 0.60–1.60)

## 2020-12-28 LAB — T3, FREE: T3, Free: 3.3 pg/mL (ref 2.3–4.2)

## 2020-12-28 LAB — TESTOSTERONE: Testosterone: 304.96 ng/dL (ref 300.00–890.00)

## 2020-12-28 LAB — HEMOGLOBIN A1C: Hgb A1c MFr Bld: 6.4 % (ref 4.6–6.5)

## 2020-12-28 MED ORDER — OZEMPIC (0.25 OR 0.5 MG/DOSE) 2 MG/1.5ML ~~LOC~~ SOPN: 0.2500 mg | PEN_INJECTOR | SUBCUTANEOUS | 11 refills | Status: DC

## 2020-12-28 NOTE — Progress Notes (Signed)
   Subjective:    Patient ID: Gregory Cortez, male    DOB: 12-19-1956, 65 y.o.   MRN: [id number]  HPI Here to follow up on issues and to ask about medications. He wants to stop Glipizide because it often drops his glucoses abruptly, sometimes into the 40s. He wants to try Ozempic instead to see if it helps him lose weight. His BP has been stable. He stopped taking Levothyroxine about 6 months ago because he says it made him too sleepy.    Review of Systems  Constitutional: Negative.   Respiratory: Negative.   Cardiovascular: Negative.   Neurological: Negative.        Objective:   Physical Exam Constitutional:      Appearance: He is obese.  Cardiovascular:     Rate and Rhythm: Normal rate and regular rhythm.     Pulses: Normal pulses.     Heart sounds: Normal heart sounds.  Pulmonary:     Effort: Pulmonary effort is normal.     Breath sounds: Normal breath sounds.  Neurological:     Mental Status: He is alert.           Assessment & Plan:  His HTN is stable. Get fasting labs to check thyroid levels, an A1c, etc. For the diabetes we will stop Glipizide and start Ozempic 0.25mg  weekly. Check a testosterone level for the hypogonadism.  [name], MD  .

## 2020-12-28 NOTE — Addendum Note (Signed)
Addended by: Lerry Liner on: 12/28/2020 09:52 AM   Modules accepted: Orders

## 2020-12-29 LAB — DRUG MONITOR, PANEL 1, W/CONF, URINE
Amphetamines: NEGATIVE ng/mL (ref <500)
Barbiturates: NEGATIVE ng/mL (ref <300)
Benzodiazepines: NEGATIVE ng/mL (ref <100)
Cocaine Metabolite: NEGATIVE ng/mL (ref <150)
Creatinine: 207.7 mg/dL
Marijuana Metabolite: NEGATIVE ng/mL (ref <20)
Methadone Metabolite: NEGATIVE ng/mL (ref <100)
Opiates: NEGATIVE ng/mL (ref <100)
Oxidant: NEGATIVE ug/mL
Oxycodone: NEGATIVE ng/mL (ref <100)
Phencyclidine: NEGATIVE ng/mL (ref <25)
pH: 5.8 (ref 4.5–9.0)

## 2020-12-29 LAB — DM TEMPLATE

## 2021-01-26 ENCOUNTER — Telehealth: Payer: Self-pay | Admitting: Family Medicine

## 2021-01-26 NOTE — Telephone Encounter (Signed)
When we spoke on 12-28-20 it was about diabetes, testosterone replacement and other issues, but we have to talk about pain management in a separate visit devoted only to this topic. Set up a PMV so I can do the Percocet refills. (virtual would be fine)

## 2021-01-26 NOTE — Telephone Encounter (Signed)
The patient had an appointmetn with Dr Clent Ridges on 12/28/2020 and discusses medications and still haven't received the refill for oxyCODONE-acetaminophen (PERCOCET) 10-325 MG tablet     [facility] [address] [place], [place] - [address] Phone:  [phone]  Fax:  [phone]

## 2021-01-29 NOTE — Telephone Encounter (Signed)
Spoke with patient about below message. Verbalize understanding

## 2021-02-02 ENCOUNTER — Telehealth (INDEPENDENT_AMBULATORY_CARE_PROVIDER_SITE_OTHER): Payer: BC Managed Care – PPO | Admitting: Family Medicine

## 2021-02-02 ENCOUNTER — Encounter: Payer: Self-pay | Admitting: Family Medicine

## 2021-02-02 VITALS — Ht 70.0 in | Wt 387.0 lb

## 2021-02-02 DIAGNOSIS — K6819 Other retroperitoneal abscess: Secondary | ICD-10-CM

## 2021-02-02 DIAGNOSIS — M25561 Pain in right knee: Secondary | ICD-10-CM | POA: Diagnosis not present

## 2021-02-02 DIAGNOSIS — M25562 Pain in left knee: Secondary | ICD-10-CM

## 2021-02-02 DIAGNOSIS — M5440 Lumbago with sciatica, unspecified side: Secondary | ICD-10-CM

## 2021-02-02 DIAGNOSIS — F1190 Opioid use, unspecified, uncomplicated: Secondary | ICD-10-CM | POA: Diagnosis not present

## 2021-02-02 DIAGNOSIS — G8929 Other chronic pain: Secondary | ICD-10-CM

## 2021-02-02 MED ORDER — OXYCODONE-ACETAMINOPHEN 10-325 MG PO TABS: 1.0000 | ORAL_TABLET | Freq: Four times a day (QID) | ORAL | 0 refills | Status: DC | PRN

## 2021-02-02 MED ORDER — OMEPRAZOLE 40 MG PO CPDR: 40.0000 mg | DELAYED_RELEASE_CAPSULE | Freq: Every day | ORAL | 3 refills | Status: DC

## 2021-02-02 MED ORDER — OXYCODONE-ACETAMINOPHEN 10-325 MG PO TABS: 1.0000 | ORAL_TABLET | Freq: Four times a day (QID) | ORAL | 0 refills | Status: AC | PRN

## 2021-02-02 NOTE — Progress Notes (Signed)
Subjective:    Patient ID: Gregory Cortez, male    DOB: [birth year], 65 y.o.   MRN: [id number]  HPI Virtual Visit via Video Note  I connected with the patient on 02/02/21 at  1:30 PM EST by a video enabled telemedicine application and verified that I am speaking with the correct person using two identifiers.  Location patient: home Location provider:work or home office Persons participating in the virtual visit: patient, provider  I discussed the limitations of evaluation and management by telemedicine and the availability of in person appointments. The patient expressed understanding and agreed to proceed.   HPI: Here for pain management. He is slowly losing weight with the goal of getting knee surgeries per Dr. Lequita Halt. Indication for chronic opioid: low back and knee pain  Medication and dose: Percocet 10-325 # pills per month: 120 Last UDS date: 12-28-20 Opioid Treatment Agreement signed (Y/N): 01-28-20 Opioid Treatment Agreement last reviewed with patient:  02-02-21 NCCSRS reviewed this encounter (include red flags): Yes    ROS: See pertinent positives and negatives per HPI.  Past Medical History:  Diagnosis Date  . Chest pain   . Diabetes mellitus type 2 in obese (HCC) 11/29/2016  . ED (erectile dysfunction)   . GERD (gastroesophageal reflux disease)    [facility]  . Hx of colonoscopy   . Low back pain    dr Channing Mutters, dr Farris Has, dr Ethelene Hal, herniated disc L4-5  . OSA (obstructive sleep apnea)    dr Shelle Iron  . PE (pulmonary embolism)    bilateral sep 2011 and again bilateral May 2012  . Primary hypercoagulable state (HCC)    no etiology found per Dr. Shirline Frees   . Pulmonary embolism (HCC) 02/22/2010   CT angio Dx  . Testosterone deficiency    dr Patsi Sears, shots every 2 weeks  . Thrombosis of arm    left arm 08/2010    Past Surgical History:  Procedure Laterality Date  . APPENDECTOMY    . cardiac stress test x 2     . COLONOSCOPY  08-19-09   per Dr. Evette Cristal, clear, repeat  in 10 yrs   . COLONOSCOPY WITH PROPOFOL N/A 10/15/2019   Procedure: COLONOSCOPY WITH PROPOFOL;  Surgeon: Graylin Shiver, MD;  Location: [facility];  Service: Endoscopy;  Laterality: N/A;  . esi     L4-5 dr Channing Mutters 03/2010  . ESOPHAGOGASTRODUODENOSCOPY     x2 - normal except reflux  . KNEE SURGERY     both  . LAPAROSCOPIC APPENDECTOMY  09/13/2012   Procedure: APPENDECTOMY LAPAROSCOPIC;  Surgeon: Almond Lint, MD;  Location: [facility];  Service: General;  Laterality: N/A;  . ROTATOR CUFF REPAIR  08-20-10   left dr Wyline Mood complicated by PE  . SHOULDER SURGERY     right and left    Family History  Problem Relation Age of Onset  . Hypertension Other   . Cancer Other        lung  . Coronary artery disease Neg Hx      Current Outpatient Medications:  .  ALPRAZolam (XANAX) 1 MG tablet, Take 1 tablet (1 mg total) by mouth 3 (three) times daily as needed for anxiety., Disp: 90 tablet, Rfl: 2 .  diphenhydramine-acetaminophen (TYLENOL PM) 25-500 MG TABS, Take 2 tablets by mouth at bedtime., Disp: , Rfl:  .  folic acid (FOLVITE) 1 MG tablet, Take 1 tablet by mouth once daily, Disp: 90 tablet, Rfl: 0 .  levothyroxine (SYNTHROID) 75 MCG tablet, Take 1 tablet (75  mcg total) by mouth daily., Disp: 90 tablet, Rfl: 3 .  metFORMIN (GLUCOPHAGE) 1000 MG tablet, TAKE 1 TABLET BY MOUTH TWICE DAILY WITH MEALS, Disp: 58 tablet, Rfl: 0 .  methocarbamol (ROBAXIN) 500 MG tablet, Take 1 tablet (500 mg total) by mouth every 8 (eight) hours as needed for muscle spasms., Disp: 270 tablet, Rfl: 3 .  Multiple Vitamin (MULTIVITAMIN WITH MINERALS) TABS tablet, Take 1 tablet by mouth once a week. , Disp: , Rfl:  .  oxyCODONE-acetaminophen (PERCOCET) 10-325 MG tablet, Take 1 tablet by mouth every 6 (six) hours as needed for pain., Disp: 120 tablet, Rfl: 0 .  rivaroxaban (XARELTO) 20 MG TABS tablet, Take 1 tablet (20 mg total) by mouth daily., Disp: 90 tablet, Rfl: 3 .  Semaglutide,0.25 or 0.5MG /DOS, (OZEMPIC, 0.25 OR 0.5  MG/DOSE,) 2 MG/1.5ML SOPN, Inject 0.25 mg into the skin once a week., Disp: 1.5 mL, Rfl: 11 .  sildenafil (VIAGRA) 100 MG tablet, Take 1 tablet (100 mg total) by mouth daily as needed for erectile dysfunction., Disp: 30 tablet, Rfl: 3 .  SYRINGE-NEEDLE, DISP, 3 ML (B-D SYRINGE/NEEDLE 3CC/22GX1.5) 22G X 1-1/2" 3 ML MISC, Inject 0.5 mL into the muscle each week., Disp: 8 each, Rfl: 3 .  testosterone cypionate (DEPOTESTOSTERONE CYPIONATE) 200 MG/ML injection, Inject 0.5 mLs (100 mg total) into the muscle 2 (two) times a week., Disp: 10 mL, Rfl: 5 .  azithromycin (ZITHROMAX Z-PAK) 250 MG tablet, As directed (Patient not taking: Reported on 02/02/2021), Disp: 6 each, Rfl: 0 .  omeprazole (PRILOSEC) 40 MG capsule, Take 1 capsule (40 mg total) by mouth daily., Disp: 90 capsule, Rfl: 3  EXAM:  VITALS per patient if applicable:  GENERAL: alert, oriented, appears well and in no acute distress  HEENT: atraumatic, conjunttiva clear, no obvious abnormalities on inspection of external nose and ears  NECK: normal movements of the head and neck  LUNGS: on inspection no signs of respiratory distress, breathing rate appears normal, no obvious gross SOB, gasping or wheezing  CV: no obvious cyanosis  MS: moves all visible extremities without noticeable abnormality  PSYCH/NEURO: pleasant and cooperative, no obvious depression or anxiety, speech and thought processing grossly intact  ASSESSMENT AND PLAN: Pain management, meds were refilled.  [name], MD  Discussed the following assessment and plan:  Abscess, retroperitoneal (HCC)  Bilateral chronic knee pain  Chronic bilateral low back pain with sciatica, sciatica laterality unspecified     I discussed the assessment and treatment plan with the patient. The patient was provided an opportunity to ask questions and all were answered. The patient agreed with the plan and demonstrated an understanding of the instructions.   The patient was advised  to call back or seek an in-person evaluation if the symptoms worsen or if the condition fails to improve as anticipated.     Review of Systems     Objective:   Physical Exam        Assessment & Plan:

## 2021-02-04 ENCOUNTER — Telehealth: Payer: Self-pay | Admitting: Family Medicine

## 2021-02-04 NOTE — Telephone Encounter (Signed)
Patient states he needs a prior authorization for his Percocets.  Please advise.

## 2021-02-04 NOTE — Telephone Encounter (Signed)
Prior authorization submitted for Percocet.   See message.    Your information has been submitted to [facility]. [employer] will review the request and fax you a determination directly, typically within 3 business days of your submission once all necessary information is received.  If [employer] has not responded in 3 business days or if you have any questions about your submission, contact [employer] [place] at [phone].

## 2021-02-09 NOTE — Telephone Encounter (Signed)
Per [employer], no PA needed.  The pharmacy will need to process the request for 7 days at a time meaning since you are requesting 4 per day, run a claim of 28 tabs per 7 days to the request to pay.  The medication is covered on [employer]'s plan as a tier 1 with no restrictions.   Please contact the pharmacy to process the request.  Spoke to the pharmacist @ [facility], they will fill a 5 day supply only due to hasn't had that medication since 06/2020.  They will call us if he picks up that Rx so the remaining pills can be sent into the pharmacy. Dm/cma

## 2021-02-12 ENCOUNTER — Telehealth (INDEPENDENT_AMBULATORY_CARE_PROVIDER_SITE_OTHER): Payer: BC Managed Care – PPO | Admitting: Family Medicine

## 2021-02-12 ENCOUNTER — Encounter: Payer: Self-pay | Admitting: Family Medicine

## 2021-02-12 DIAGNOSIS — R0789 Other chest pain: Secondary | ICD-10-CM | POA: Diagnosis not present

## 2021-02-12 NOTE — Progress Notes (Signed)
Subjective:    Patient ID: Gregory Cortez, male    DOB: 1956/11/20, 65 y.o.   MRN: [id number]  HPI Virtual Visit via Video Note  I connected with the patient on 02/12/21 at 10:00 AM EST by a video enabled telemedicine application and verified that I am speaking with the correct person using two identifiers.  Location patient: home Location provider:work or home office Persons participating in the virtual visit: patient, provider  I discussed the limitations of evaluation and management by telemedicine and the availability of in person appointments. The patient expressed understanding and agreed to proceed.   HPI: Here with concerns about various symptoms. He has been on a strict low carb diet the past 2 weeks and he recently started taking Ozempic. He has already lost about 14 lbs. However he says he feels frequent tightness in the chest and he can sometimes feel his heart beating. No SOB or cough. He has a hx of PE and he asks if he should be checked for this again. His am fasting glucoses have been 100-110 lately. He does not check his BP. He also says he has been under a lot of stress at home. His wife deals with severe depression , and he had to have her committed involuntarily yesterday.    ROS: See pertinent positives and negatives per HPI.  Past Medical History:  Diagnosis Date  . Chest pain   . Diabetes mellitus type 2 in obese (HCC) 11/29/2016  . ED (erectile dysfunction)   . GERD (gastroesophageal reflux disease)    [facility]  . Hx of colonoscopy   . Low back pain    dr Channing Mutters, dr Farris Has, dr Ethelene Hal, herniated disc L4-5  . OSA (obstructive sleep apnea)    dr Shelle Iron  . PE (pulmonary embolism)    bilateral sep 2011 and again bilateral May 2012  . Primary hypercoagulable state (HCC)    no etiology found per Dr. Shirline Frees   . Pulmonary embolism (HCC) 02/22/2010   CT angio Dx  . Testosterone deficiency    dr Patsi Sears, shots every 2 weeks  . Thrombosis of arm    left arm  08/2010    Past Surgical History:  Procedure Laterality Date  . APPENDECTOMY    . cardiac stress test x 2     . COLONOSCOPY  08-19-09   per Dr. Evette Cristal, clear, repeat in 10 yrs   . COLONOSCOPY WITH PROPOFOL N/A 10/15/2019   Procedure: COLONOSCOPY WITH PROPOFOL;  Surgeon: Graylin Shiver, MD;  Location: [facility];  Service: Endoscopy;  Laterality: N/A;  . esi     L4-5 dr Channing Mutters 03/2010  . ESOPHAGOGASTRODUODENOSCOPY     x2 - normal except reflux  . KNEE SURGERY     both  . LAPAROSCOPIC APPENDECTOMY  09/13/2012   Procedure: APPENDECTOMY LAPAROSCOPIC;  Surgeon: Almond Lint, MD;  Location: [facility];  Service: General;  Laterality: N/A;  . ROTATOR CUFF REPAIR  08-20-10   left dr Wyline Mood complicated by PE  . SHOULDER SURGERY     right and left    Family History  Problem Relation Age of Onset  . Hypertension Other   . Cancer Other        lung  . Coronary artery disease Neg Hx      Current Outpatient Medications:  .  ALPRAZolam (XANAX) 1 MG tablet, Take 1 tablet (1 mg total) by mouth 3 (three) times daily as needed for anxiety., Disp: 90 tablet, Rfl: 2 .  diphenhydramine-acetaminophen (  TYLENOL PM) 25-500 MG TABS, Take 2 tablets by mouth at bedtime., Disp: , Rfl:  .  folic acid (FOLVITE) 1 MG tablet, Take 1 tablet by mouth once daily, Disp: 90 tablet, Rfl: 0 .  levothyroxine (SYNTHROID) 75 MCG tablet, Take 1 tablet (75 mcg total) by mouth daily., Disp: 90 tablet, Rfl: 3 .  metFORMIN (GLUCOPHAGE) 1000 MG tablet, TAKE 1 TABLET BY MOUTH TWICE DAILY WITH MEALS, Disp: 58 tablet, Rfl: 0 .  methocarbamol (ROBAXIN) 500 MG tablet, Take 1 tablet (500 mg total) by mouth every 8 (eight) hours as needed for muscle spasms., Disp: 270 tablet, Rfl: 3 .  Multiple Vitamin (MULTIVITAMIN WITH MINERALS) TABS tablet, Take 1 tablet by mouth once a week. , Disp: , Rfl:  .  omeprazole (PRILOSEC) 40 MG capsule, Take 1 capsule (40 mg total) by mouth daily., Disp: 90 capsule, Rfl: 3 .  [START ON 04/02/2021]  oxyCODONE-acetaminophen (PERCOCET) 10-325 MG tablet, Take 1 tablet by mouth every 6 (six) hours as needed for pain., Disp: 120 tablet, Rfl: 0 .  rivaroxaban (XARELTO) 20 MG TABS tablet, Take 1 tablet (20 mg total) by mouth daily., Disp: 90 tablet, Rfl: 3 .  Semaglutide,0.25 or 0.5MG /DOS, (OZEMPIC, 0.25 OR 0.5 MG/DOSE,) 2 MG/1.5ML SOPN, Inject 0.25 mg into the skin once a week., Disp: 1.5 mL, Rfl: 11 .  sildenafil (VIAGRA) 100 MG tablet, Take 1 tablet (100 mg total) by mouth daily as needed for erectile dysfunction., Disp: 30 tablet, Rfl: 3 .  SYRINGE-NEEDLE, DISP, 3 ML (B-D SYRINGE/NEEDLE 3CC/22GX1.5) 22G X 1-1/2" 3 ML MISC, Inject 0.5 mL into the muscle each week., Disp: 8 each, Rfl: 3 .  testosterone cypionate (DEPOTESTOSTERONE CYPIONATE) 200 MG/ML injection, Inject 0.5 mLs (100 mg total) into the muscle 2 (two) times a week., Disp: 10 mL, Rfl: 5  EXAM:  VITALS per patient if applicable:  GENERAL: alert, oriented, appears well and in no acute distress  HEENT: atraumatic, conjunttiva clear, no obvious abnormalities on inspection of external nose and ears  NECK: normal movements of the head and neck  LUNGS: on inspection no signs of respiratory distress, breathing rate appears normal, no obvious gross SOB, gasping or wheezing  CV: no obvious cyanosis  MS: moves all visible extremities without noticeable abnormality  PSYCH/NEURO: pleasant and cooperative, no obvious depression or anxiety, speech and thought processing grossly intact  ASSESSMENT AND PLAN: Chest tightness. I told him that I think it is unlikely that he has another PE now, but it would be good to investigate this. I think his body is undergoing a lot of stress with rapid weight loss, and obviously he is dealing with a lot of emotional stress now also. We agreed that he will come in next week for an exam, an EKG, and labs (including a d dimer).  [name], MD  Discussed the following assessment and plan:  No diagnosis  found.     I discussed the assessment and treatment plan with the patient. The patient was provided an opportunity to ask questions and all were answered. The patient agreed with the plan and demonstrated an understanding of the instructions.   The patient was advised to call back or seek an in-person evaluation if the symptoms worsen or if the condition fails to improve as anticipated.     Review of Systems  Constitutional: Negative.   Respiratory: Positive for chest tightness. Negative for cough and shortness of breath.   Cardiovascular: Negative.        Objective:  Physical Exam        Assessment & Plan:

## 2021-02-15 ENCOUNTER — Other Ambulatory Visit: Payer: Self-pay

## 2021-02-15 ENCOUNTER — Encounter: Payer: Self-pay | Admitting: Family Medicine

## 2021-02-15 ENCOUNTER — Ambulatory Visit (INDEPENDENT_AMBULATORY_CARE_PROVIDER_SITE_OTHER): Payer: BC Managed Care – PPO | Admitting: Family Medicine

## 2021-02-15 VITALS — BP 160/84 | HR 88 | Temp 98.8°F | Ht 70.0 in | Wt 388.0 lb

## 2021-02-15 DIAGNOSIS — I10 Essential (primary) hypertension: Secondary | ICD-10-CM | POA: Diagnosis not present

## 2021-02-15 DIAGNOSIS — R0602 Shortness of breath: Secondary | ICD-10-CM

## 2021-02-15 MED ORDER — LISINOPRIL-HYDROCHLOROTHIAZIDE 20-12.5 MG PO TABS: 1.0000 | ORAL_TABLET | Freq: Every day | ORAL | 3 refills | Status: DC

## 2021-02-15 NOTE — Progress Notes (Signed)
   Subjective:    Patient ID: Gregory Cortez, male    DOB: [birth year], 65 y.o.   MRN: [id number]  HPI Here for chest tightness, SOB, headaches, and elevated BP readings for the past week or two. He has been under a lot of stress with family issues. Even though he is obese, he has never had high BP before. He denies any chest pain. He had recent labs showing normal renal function. His BP at home this morning was 192/91. He is concerned because he has a hx of PE. No pain or swelling in the legs.    Review of Systems  Constitutional: Negative.   Respiratory: Positive for chest tightness and shortness of breath. Negative for cough and wheezing.   Cardiovascular: Negative.   Gastrointestinal: Negative.   Genitourinary: Negative.        Objective:   Physical Exam Constitutional:      General: He is not in acute distress.    Appearance: He is obese.  Cardiovascular:     Rate and Rhythm: Normal rate and regular rhythm.     Pulses: Normal pulses.     Heart sounds: Normal heart sounds.     Comments: EKG is normal except for frequent PAC's and an isolated PVC  Pulmonary:     Effort: Pulmonary effort is normal.     Breath sounds: Normal breath sounds.  Musculoskeletal:     Right lower leg: No edema.     Left lower leg: No edema.  Neurological:     Mental Status: He is alert.           Assessment & Plan:  He has new onset HTN, and we will start him on Lisinopril HCT 20-12.5 daily. I think it is unlikely he has another PE but we will check a d dimer today. He will continue to work on losing weight. Recheck in 3 weeks.  [name], MD

## 2021-02-16 ENCOUNTER — Other Ambulatory Visit: Payer: Self-pay

## 2021-02-16 ENCOUNTER — Ambulatory Visit: Payer: BC Managed Care – PPO | Admitting: Family Medicine

## 2021-02-16 DIAGNOSIS — E559 Vitamin D deficiency, unspecified: Secondary | ICD-10-CM

## 2021-02-16 LAB — D-DIMER, QUANTITATIVE: D-Dimer, Quant: 0.38 mcg/mL FEU (ref <0.50)

## 2021-02-16 LAB — VITAMIN D 25 HYDROXY (VIT D DEFICIENCY, FRACTURES): VITD: 18.83 ng/mL — ABNORMAL LOW (ref 30.00–100.00)

## 2021-02-16 MED ORDER — VITAMIN D (ERGOCALCIFEROL) 1.25 MG (50000 UNIT) PO CAPS: 50000.0000 [IU] | ORAL_CAPSULE | ORAL | 3 refills | Status: DC

## 2021-02-18 ENCOUNTER — Other Ambulatory Visit: Payer: Self-pay | Admitting: Family Medicine

## 2021-02-18 NOTE — Telephone Encounter (Signed)
Last refill- 07/02/2020--5 refills Last office visit- 02/15/2021  No future appointment scheduled

## 2021-03-02 ENCOUNTER — Other Ambulatory Visit: Payer: Self-pay | Admitting: Family Medicine

## 2021-03-14 ENCOUNTER — Other Ambulatory Visit: Payer: Self-pay | Admitting: Hematology & Oncology

## 2021-04-14 ENCOUNTER — Telehealth: Payer: Self-pay | Admitting: Family Medicine

## 2021-04-14 NOTE — Telephone Encounter (Signed)
Patient dropped off paperwork for a disability placard.   Patient would like a call at [phone] when paperwork is complete and ready for pickup.  Paperwork will be placed in folder.  Please advise.

## 2021-04-16 NOTE — Telephone Encounter (Signed)
Spoke with pt aware that his disability Placard is completed and ready for pick up at the front office. Pt state that he will pick up form on Monday. Copy sent to scanning

## 2021-04-16 NOTE — Telephone Encounter (Signed)
The form is ready  

## 2021-04-30 ENCOUNTER — Ambulatory Visit (INDEPENDENT_AMBULATORY_CARE_PROVIDER_SITE_OTHER): Payer: BC Managed Care – PPO | Admitting: Family Medicine

## 2021-04-30 ENCOUNTER — Ambulatory Visit (INDEPENDENT_AMBULATORY_CARE_PROVIDER_SITE_OTHER): Payer: BC Managed Care – PPO

## 2021-04-30 ENCOUNTER — Other Ambulatory Visit: Payer: Self-pay

## 2021-04-30 ENCOUNTER — Encounter: Payer: Self-pay | Admitting: Family Medicine

## 2021-04-30 VITALS — BP 142/72 | HR 90 | Temp 98.5°F | Wt 372.0 lb

## 2021-04-30 DIAGNOSIS — M549 Dorsalgia, unspecified: Secondary | ICD-10-CM

## 2021-04-30 DIAGNOSIS — M546 Pain in thoracic spine: Secondary | ICD-10-CM | POA: Diagnosis not present

## 2021-04-30 DIAGNOSIS — E291 Testicular hypofunction: Secondary | ICD-10-CM | POA: Diagnosis not present

## 2021-04-30 LAB — POC URINALSYSI DIPSTICK (AUTOMATED)
Bilirubin, UA: NEGATIVE
Blood, UA: NEGATIVE
Glucose, UA: NEGATIVE
Ketones, UA: NEGATIVE
Leukocytes, UA: NEGATIVE
Nitrite, UA: NEGATIVE
Protein, UA: NEGATIVE
Spec Grav, UA: 1.02 (ref 1.010–1.025)
Urobilinogen, UA: 1 E.U./dL
pH, UA: 6 (ref 5.0–8.0)

## 2021-04-30 NOTE — Addendum Note (Signed)
Addended by: Miguel Aschoff on: 04/30/2021 04:09 PM   Modules accepted: Orders

## 2021-04-30 NOTE — Progress Notes (Signed)
   Subjective:    Patient ID: Gregory Cortez, male    DOB: [birth year], 65 y.o.   MRN: [id number]  HPI Here for 2 weeks of a sharp pain in the middle of the back. 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 He is not SOB but taking a deep breath causes pain. He is trying to avoid taking narcotic pain meds unless he has to.    Review of Systems  Constitutional: Negative.   Respiratory: Negative.   Cardiovascular: Negative.   Musculoskeletal: Positive for back pain.       Objective:   Physical Exam Constitutional:      Appearance: Normal appearance. He is not ill-appearing.  Cardiovascular:     Rate and Rhythm: Normal rate and regular rhythm.     Pulses: Normal pulses.     Heart sounds: Normal heart sounds.  Pulmonary:     Effort: Pulmonary effort is normal.     Breath sounds: Normal breath sounds.  Musculoskeletal:     Comments: He is tender over the upper thoracic area of the back. ROM is full, but extension of the spine causes the pain to be worse   Neurological:     Mental Status: He is alert.           Assessment & Plan:  Thoracic back pain. We will get Xrays today . He will take ES Tylenol 1000 mg BID as needed for pain. He should adjust his workspace so that he can sit more erect on his job.  [name], MD

## 2021-04-30 NOTE — Addendum Note (Signed)
Addended by: Carola Rhine on: 04/30/2021 04:26 PM   Modules accepted: Orders

## 2021-05-01 LAB — TESTOSTERONE: Testosterone: 1133 ng/dL — ABNORMAL HIGH (ref 250–827)

## 2021-06-26 ENCOUNTER — Other Ambulatory Visit: Payer: Self-pay | Admitting: Family Medicine

## 2021-06-29 ENCOUNTER — Telehealth: Payer: Self-pay | Admitting: Family Medicine

## 2021-06-29 ENCOUNTER — Other Ambulatory Visit: Payer: Self-pay | Admitting: Hematology & Oncology

## 2021-06-29 NOTE — Telephone Encounter (Signed)
Pt call and stated he need refills on omeprazole (PRILOSEC) 40 MG capsule ,folic acid (FOLVITE) 1 MG tablet  and oxyCODONE-acetaminophen (PERCOCET) 10-325 MG tablet sent to  [facility] [address] [place], [place] - [address] Phone:  [phone]  Fax:  [phone]

## 2021-06-30 NOTE — Telephone Encounter (Signed)
Last office visit- 04/30/21 

## 2021-06-30 NOTE — Telephone Encounter (Signed)
Please refill the Folvite and Prilosec. He will need a PMV for the pain medication

## 2021-07-01 ENCOUNTER — Other Ambulatory Visit: Payer: Self-pay

## 2021-07-01 MED ORDER — FOLIC ACID 1 MG PO TABS: 1.0000 mg | ORAL_TABLET | Freq: Every day | ORAL | 1 refills | Status: DC

## 2021-07-01 NOTE — Telephone Encounter (Signed)
Lvm for pt to call and schedule PMV for pain medicine.  Refill for [facility] sent to [place], Prilosec had refills on file.

## 2021-07-07 ENCOUNTER — Encounter: Payer: Self-pay | Admitting: Family Medicine

## 2021-07-07 ENCOUNTER — Ambulatory Visit (INDEPENDENT_AMBULATORY_CARE_PROVIDER_SITE_OTHER): Payer: BC Managed Care – PPO | Admitting: Family Medicine

## 2021-07-07 ENCOUNTER — Other Ambulatory Visit: Payer: Self-pay

## 2021-07-07 VITALS — BP 132/82 | HR 74 | Temp 98.1°F | Ht 70.0 in | Wt 353.4 lb

## 2021-07-07 DIAGNOSIS — E039 Hypothyroidism, unspecified: Secondary | ICD-10-CM

## 2021-07-07 DIAGNOSIS — E291 Testicular hypofunction: Secondary | ICD-10-CM

## 2021-07-07 DIAGNOSIS — F1190 Opioid use, unspecified, uncomplicated: Secondary | ICD-10-CM

## 2021-07-07 DIAGNOSIS — E669 Obesity, unspecified: Secondary | ICD-10-CM | POA: Diagnosis not present

## 2021-07-07 DIAGNOSIS — E559 Vitamin D deficiency, unspecified: Secondary | ICD-10-CM | POA: Diagnosis not present

## 2021-07-07 DIAGNOSIS — E1169 Type 2 diabetes mellitus with other specified complication: Secondary | ICD-10-CM

## 2021-07-07 DIAGNOSIS — M25561 Pain in right knee: Secondary | ICD-10-CM

## 2021-07-07 DIAGNOSIS — M25562 Pain in left knee: Secondary | ICD-10-CM

## 2021-07-07 DIAGNOSIS — G8929 Other chronic pain: Secondary | ICD-10-CM

## 2021-07-07 LAB — VITAMIN D 25 HYDROXY (VIT D DEFICIENCY, FRACTURES): VITD: 53.16 ng/mL (ref 30.00–100.00)

## 2021-07-07 LAB — CBC WITH DIFFERENTIAL/PLATELET
Basophils Absolute: 0.1 10*3/uL (ref 0.0–0.1)
Basophils Relative: 1.4 % (ref 0.0–3.0)
Eosinophils Absolute: 0.2 10*3/uL (ref 0.0–0.7)
Eosinophils Relative: 3.1 % (ref 0.0–5.0)
HCT: 49.9 % (ref 39.0–52.0)
Hemoglobin: 16.3 g/dL (ref 13.0–17.0)
Lymphocytes Relative: 22.2 % (ref 12.0–46.0)
Lymphs Abs: 1.2 10*3/uL (ref 0.7–4.0)
MCHC: 32.6 g/dL (ref 30.0–36.0)
MCV: 88.4 fl (ref 78.0–100.0)
Monocytes Absolute: 0.5 10*3/uL (ref 0.1–1.0)
Monocytes Relative: 10.5 % (ref 3.0–12.0)
Neutro Abs: 3.3 10*3/uL (ref 1.4–7.7)
Neutrophils Relative %: 62.8 % (ref 43.0–77.0)
Platelets: 222 10*3/uL (ref 150.0–400.0)
RBC: 5.65 Mil/uL (ref 4.22–5.81)
RDW: 17.3 % — ABNORMAL HIGH (ref 11.5–15.5)
WBC: 5.2 10*3/uL (ref 4.0–10.5)

## 2021-07-07 LAB — HEPATIC FUNCTION PANEL
ALT: 66 U/L — ABNORMAL HIGH (ref 0–53)
AST: 34 U/L (ref 0–37)
Albumin: 4.2 g/dL (ref 3.5–5.2)
Alkaline Phosphatase: 55 U/L (ref 39–117)
Bilirubin, Direct: 0.1 mg/dL (ref 0.0–0.3)
Total Bilirubin: 0.7 mg/dL (ref 0.2–1.2)
Total Protein: 6.9 g/dL (ref 6.0–8.3)

## 2021-07-07 LAB — LIPID PANEL
Cholesterol: 171 mg/dL (ref 0–200)
HDL: 25.6 mg/dL — ABNORMAL LOW (ref >39.00)
LDL Cholesterol: 124 mg/dL — ABNORMAL HIGH (ref 0–99)
NonHDL: 145.84
Total CHOL/HDL Ratio: 7
Triglycerides: 109 mg/dL (ref 0.0–149.0)
VLDL: 21.8 mg/dL (ref 0.0–40.0)

## 2021-07-07 LAB — T4, FREE: Free T4: 0.7 ng/dL (ref 0.60–1.60)

## 2021-07-07 LAB — TESTOSTERONE: Testosterone: 451.22 ng/dL (ref 300.00–890.00)

## 2021-07-07 LAB — BASIC METABOLIC PANEL
BUN: 15 mg/dL (ref 6–23)
CO2: 28 mEq/L (ref 19–32)
Calcium: 9.8 mg/dL (ref 8.4–10.5)
Chloride: 102 mEq/L (ref 96–112)
Creatinine, Ser: 0.8 mg/dL (ref 0.40–1.50)
GFR: 94.75 mL/min (ref >60.00)
Glucose, Bld: 97 mg/dL (ref 70–99)
Potassium: 4.3 mEq/L (ref 3.5–5.1)
Sodium: 139 mEq/L (ref 135–145)

## 2021-07-07 LAB — TSH: TSH: 1.85 u[IU]/mL (ref 0.35–5.50)

## 2021-07-07 LAB — PSA: PSA: 0.99 ng/mL (ref 0.10–4.00)

## 2021-07-07 LAB — HEMOGLOBIN A1C: Hgb A1c MFr Bld: 4.9 % (ref 4.6–6.5)

## 2021-07-07 LAB — T3, FREE: T3, Free: 3.4 pg/mL (ref 2.3–4.2)

## 2021-07-07 MED ORDER — OXYCODONE-ACETAMINOPHEN 10-325 MG PO TABS: 1.0000 | ORAL_TABLET | Freq: Four times a day (QID) | ORAL | 0 refills | Status: DC | PRN

## 2021-07-07 MED ORDER — OXYCODONE-ACETAMINOPHEN 10-325 MG PO TABS
1.0000 | ORAL_TABLET | Freq: Four times a day (QID) | ORAL | 0 refills | Status: AC | PRN
Start: 2021-09-07 — End: 2021-10-07

## 2021-07-07 NOTE — Progress Notes (Signed)
   Subjective:    Patient ID: Gregory Cortez, male    DOB: 14-Feb-1956, 65 y.o.   MRN: [id number]  HPI Here for pain management, he is doing well. He has lost about 55 pounds in the past year, and his knees feel better.     Review of Systems  Constitutional: Negative.   Respiratory: Negative.    Cardiovascular: Negative.   Musculoskeletal:  Positive for arthralgias.      Objective:   Physical Exam Constitutional:      Appearance: He is obese.  Cardiovascular:     Rate and Rhythm: Normal rate and regular rhythm.     Pulses: Normal pulses.     Heart sounds: Normal heart sounds.  Pulmonary:     Effort: Pulmonary effort is normal.     Breath sounds: Normal breath sounds.  Neurological:     Mental Status: He is alert.          Assessment & Plan:  Pain management. Indication for chronic opioid: chronic knee pain Medication and dose: Percocet 10-325 # pills per month: 120 Last UDS date: 12-28-20 Opioid Treatment Agreement signed (Y/N): 01-28-20 Opioid Treatment Agreement last reviewed with patient:  07-07-21 NCCSRS reviewed this encounter (include red flags): Yes Meds were refilled.  [name], MD

## 2021-07-20 DIAGNOSIS — M461 Sacroiliitis, not elsewhere classified: Secondary | ICD-10-CM | POA: Diagnosis not present

## 2021-07-20 DIAGNOSIS — M545 Low back pain, unspecified: Secondary | ICD-10-CM | POA: Diagnosis not present

## 2021-08-11 ENCOUNTER — Other Ambulatory Visit: Payer: Self-pay | Admitting: Family Medicine

## 2021-08-11 DIAGNOSIS — M533 Sacrococcygeal disorders, not elsewhere classified: Secondary | ICD-10-CM | POA: Diagnosis not present

## 2021-08-17 ENCOUNTER — Telehealth: Payer: Self-pay

## 2021-08-17 MED ORDER — ALPRAZOLAM 1 MG PO TABS: 1.0000 mg | ORAL_TABLET | Freq: Three times a day (TID) | ORAL | 5 refills | Status: DC | PRN

## 2021-08-17 NOTE — Telephone Encounter (Signed)
Called patient to inform prescription has been sent to [facility] on [address]

## 2021-08-17 NOTE — Addendum Note (Signed)
Addended by: [name] A on: 08/17/2021 04:00 PM   Modules accepted: Orders

## 2021-08-17 NOTE — Telephone Encounter (Signed)
Done

## 2021-08-17 NOTE — Telephone Encounter (Signed)
Pt LOV was 07/07/21 Last refill done 10/26/2020 Please advise

## 2021-08-17 NOTE — Telephone Encounter (Signed)
Patient called requesting Rx refill ALPRAZolam (XANAX) 1 MG tablet 

## 2021-08-19 ENCOUNTER — Other Ambulatory Visit: Payer: Self-pay | Admitting: Family Medicine

## 2021-08-19 ENCOUNTER — Other Ambulatory Visit: Payer: BC Managed Care – PPO

## 2021-08-19 ENCOUNTER — Other Ambulatory Visit: Payer: Self-pay

## 2021-08-19 ENCOUNTER — Telehealth: Payer: Self-pay | Admitting: Family Medicine

## 2021-08-19 MED ORDER — LISINOPRIL-HYDROCHLOROTHIAZIDE 20-12.5 MG PO TABS: 1.0000 | ORAL_TABLET | Freq: Every day | ORAL | 11 refills | Status: DC

## 2021-08-19 MED ORDER — TESTOSTERONE CYPIONATE 200 MG/ML IM SOLN: INTRAMUSCULAR | 2 refills | Status: DC

## 2021-08-19 NOTE — Telephone Encounter (Signed)
Patient requested a refill for his blood pressure medication.   Patient pharmacy is still [facility] [address] [place], [place] - [address]  [address], [place] [address]   Please advise.

## 2021-08-19 NOTE — Telephone Encounter (Signed)
Left detailed message for pt advising to pick up Rx for Lisinopril / Testosterone at his pharmacy.

## 2021-08-19 NOTE — Telephone Encounter (Signed)
Call in Lisinopril HCT 20-12.5 to take daily, #30 with 11 rf

## 2021-08-20 ENCOUNTER — Ambulatory Visit: Payer: BC Managed Care – PPO | Admitting: Family Medicine

## 2021-08-20 ENCOUNTER — Telehealth: Payer: Self-pay

## 2021-08-20 ENCOUNTER — Other Ambulatory Visit: Payer: Self-pay | Admitting: Family Medicine

## 2021-08-20 MED ORDER — OZEMPIC (0.25 OR 0.5 MG/DOSE) 2 MG/1.5ML ~~LOC~~ SOPN: 0.5000 mg | PEN_INJECTOR | SUBCUTANEOUS | 5 refills | Status: DC

## 2021-08-20 MED ORDER — TESTOSTERONE CYPIONATE 200 MG/ML IM SOLN: INTRAMUSCULAR | 2 refills | Status: DC

## 2021-08-20 NOTE — Progress Notes (Signed)
Done

## 2021-08-20 NOTE — Telephone Encounter (Signed)
Pt Rx for Testosterone was faxed to his pharmacy

## 2021-09-07 DIAGNOSIS — L600 Ingrowing nail: Secondary | ICD-10-CM | POA: Diagnosis not present

## 2021-09-29 DIAGNOSIS — M533 Sacrococcygeal disorders, not elsewhere classified: Secondary | ICD-10-CM | POA: Diagnosis not present

## 2021-10-03 ENCOUNTER — Emergency Department (HOSPITAL_BASED_OUTPATIENT_CLINIC_OR_DEPARTMENT_OTHER): Payer: BC Managed Care – PPO

## 2021-10-03 ENCOUNTER — Other Ambulatory Visit: Payer: Self-pay

## 2021-10-03 ENCOUNTER — Emergency Department (HOSPITAL_BASED_OUTPATIENT_CLINIC_OR_DEPARTMENT_OTHER)
Admission: EM | Admit: 2021-10-03 | Discharge: 2021-10-04 | Disposition: A | Discharge status: Home / Self Care | Payer: BC Managed Care – PPO | Attending: Emergency Medicine | Admitting: Emergency Medicine

## 2021-10-03 DIAGNOSIS — E119 Type 2 diabetes mellitus without complications: Secondary | ICD-10-CM | POA: Insufficient documentation

## 2021-10-03 DIAGNOSIS — Z7984 Long term (current) use of oral hypoglycemic drugs: Secondary | ICD-10-CM | POA: Diagnosis not present

## 2021-10-03 DIAGNOSIS — R079 Chest pain, unspecified: Secondary | ICD-10-CM | POA: Diagnosis not present

## 2021-10-03 DIAGNOSIS — Z20822 Contact with and (suspected) exposure to covid-19: Secondary | ICD-10-CM | POA: Diagnosis not present

## 2021-10-03 DIAGNOSIS — R0602 Shortness of breath: Secondary | ICD-10-CM | POA: Diagnosis not present

## 2021-10-03 DIAGNOSIS — Z79899 Other long term (current) drug therapy: Secondary | ICD-10-CM | POA: Diagnosis not present

## 2021-10-03 DIAGNOSIS — R600 Localized edema: Secondary | ICD-10-CM | POA: Diagnosis not present

## 2021-10-03 DIAGNOSIS — R0789 Other chest pain: Secondary | ICD-10-CM | POA: Diagnosis not present

## 2021-10-03 DIAGNOSIS — R0781 Pleurodynia: Secondary | ICD-10-CM | POA: Diagnosis not present

## 2021-10-03 DIAGNOSIS — E039 Hypothyroidism, unspecified: Secondary | ICD-10-CM | POA: Diagnosis not present

## 2021-10-03 DIAGNOSIS — R0902 Hypoxemia: Secondary | ICD-10-CM | POA: Insufficient documentation

## 2021-10-03 DIAGNOSIS — J9601 Acute respiratory failure with hypoxia: Secondary | ICD-10-CM | POA: Diagnosis present

## 2021-10-03 LAB — CBC
HCT: 52.9 % — ABNORMAL HIGH (ref 39.0–52.0)
Hemoglobin: 17.2 g/dL — ABNORMAL HIGH (ref 13.0–17.0)
MCH: 29.5 pg (ref 26.0–34.0)
MCHC: 32.5 g/dL (ref 30.0–36.0)
MCV: 90.7 fL (ref 80.0–100.0)
Platelets: 262 10*3/uL (ref 150–400)
RBC: 5.83 MIL/uL — ABNORMAL HIGH (ref 4.22–5.81)
RDW: 14.5 % (ref 11.5–15.5)
WBC: 8.5 10*3/uL (ref 4.0–10.5)
nRBC: 0 % (ref 0.0–0.2)

## 2021-10-03 LAB — BASIC METABOLIC PANEL
Anion gap: 10 (ref 5–15)
BUN: 15 mg/dL (ref 8–23)
CO2: 27 mmol/L (ref 22–32)
Calcium: 9.6 mg/dL (ref 8.9–10.3)
Chloride: 101 mmol/L (ref 98–111)
Creatinine, Ser: 0.64 mg/dL (ref 0.61–1.24)
GFR, Estimated: >60 mL/min (ref >60)
Glucose, Bld: 109 mg/dL — ABNORMAL HIGH (ref 70–99)
Potassium: 3.6 mmol/L (ref 3.5–5.1)
Sodium: 138 mmol/L (ref 135–145)

## 2021-10-03 LAB — TROPONIN I (HIGH SENSITIVITY)
Troponin I (High Sensitivity): 3 ng/L (ref <18)
Troponin I (High Sensitivity): 4 ng/L (ref <18)

## 2021-10-03 LAB — HEPATIC FUNCTION PANEL
ALT: 74 U/L — ABNORMAL HIGH (ref 0–44)
AST: 28 U/L (ref 15–41)
Albumin: 3.8 g/dL (ref 3.5–5.0)
Alkaline Phosphatase: 55 U/L (ref 38–126)
Bilirubin, Direct: 0.1 mg/dL (ref 0.0–0.2)
Indirect Bilirubin: 0.5 mg/dL (ref 0.3–0.9)
Total Bilirubin: 0.6 mg/dL (ref 0.3–1.2)
Total Protein: 6.4 g/dL — ABNORMAL LOW (ref 6.5–8.1)

## 2021-10-03 LAB — RESP PANEL BY RT-PCR (FLU A&B, COVID) ARPGX2
Influenza A by PCR: NEGATIVE
Influenza B by PCR: NEGATIVE
SARS Coronavirus 2 by RT PCR: NEGATIVE

## 2021-10-03 LAB — LIPASE, BLOOD: Lipase: 61 U/L — ABNORMAL HIGH (ref 11–51)

## 2021-10-03 LAB — BRAIN NATRIURETIC PEPTIDE: B Natriuretic Peptide: 12.4 pg/mL (ref 0.0–100.0)

## 2021-10-03 MED ORDER — IOHEXOL 350 MG/ML SOLN
100.0000 mL | Freq: Once | INTRAVENOUS | Status: AC | PRN
  Administered 2021-10-03: 100 mL via INTRAVENOUS

## 2021-10-03 NOTE — ED Notes (Signed)
Patient transported to CT 

## 2021-10-03 NOTE — ED Triage Notes (Signed)
Patient reports chest pain x3 days worsening in pain. Endorses [facility]. Talking in complete sentences but winded with exertion. Denies N/V/D.

## 2021-10-03 NOTE — Plan of Care (Signed)
[facility] will assume care on arrival to accepting facility. Until arrival, care as per EDP. However, [facility] available 24/7 for questions and assistance.  Nursing staff please page [facility] Admits and Consults ([phone]) as soon as the patient arrives the hospital.   

## 2021-10-03 NOTE — ED Notes (Signed)
Patient placed on 2LNC at this time d/t desaturations while talking. SpO2 94% at this time.

## 2021-10-03 NOTE — ED Provider Notes (Signed)
[facility] EMERGENCY DEPT Provider Note   CSN: [id number] Arrival date & time: 10/03/21  1847     History Chief Complaint  Patient presents with   Chest Pain   Shortness of Breath    Gregory Cortez is a 65 y.o. male.  The history is provided by the patient and medical records. No language interpreter was used.  Chest Pain Pain location:  Substernal area Pain quality: aching and tightness   Pain radiates to:  Mid back Pain severity:  Moderate Onset quality:  Gradual Duration:  3 days Timing:  Intermittent Progression:  Waxing and waning Chronicity:  New Relieved by:  Nothing Exacerbated by: sometimes exertion. Associated symptoms: shortness of breath   Associated symptoms: no abdominal pain, no altered mental status, no back pain, no cough (no new cough over chronic cough), no diaphoresis, no dizziness, no fatigue, no fever, no headache, no heartburn, no lower extremity edema, no nausea, no near-syncope, no numbness, no palpitations, no syncope, no vomiting and no weakness   Risk factors: prior DVT/PE   Shortness of Breath Severity:  Moderate Associated symptoms: chest pain   Associated symptoms: no abdominal pain, no cough (no new cough over chronic cough), no diaphoresis, no fever, no headaches, no neck pain, no syncope, no vomiting and no wheezing       Past Medical History:  Diagnosis Date   Chest pain    Diabetes mellitus type 2 in obese (HCC) 11/29/2016   ED (erectile dysfunction)    GERD (gastroesophageal reflux disease)    [facility]   Hx of colonoscopy    Low back pain    dr Channing Mutters, dr Farris Has, dr Ethelene Hal, herniated disc L4-5   OSA (obstructive sleep apnea)    dr Shelle Iron   PE (pulmonary embolism)    bilateral sep 2011 and again bilateral May 2012   Primary hypercoagulable state (HCC)    no etiology found per Dr. Shirline Frees    Pulmonary embolism (HCC) 02/22/2010   CT angio Dx   Testosterone deficiency    dr Patsi Sears, shots every 2 weeks    Thrombosis of arm    left arm 08/2010    Patient Active Problem List   Diagnosis Date Noted   Hypothyroidism 07/02/2020   Bilateral chronic knee pain 04/29/2019   Diabetes mellitus type 2 in obese (HCC) 11/29/2016   Acute respiratory failure with hypoxia (HCC) 09/14/2015   Hemorrhoids 02/25/2014   Chronic anticoagulation for h/o Pulmonary embolism 09/11/2012   Obesity, Class III, BMI 40-49.9 (morbid obesity) (HCC) 09/11/2012   Primary hypercoagulable state (HCC)    Anxiety state 08/31/2010   LOW BACK PAIN 05/10/2010   EDEMA LEG 05/10/2010   Hypogonadism in male 02/23/2010   Sleep apnea 02/23/2010   GERD 02/23/2010   ERECTILE DYSFUNCTION, 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 Evette Cristal, clear, repeat in 10 yrs    COLONOSCOPY WITH PROPOFOL N/A 10/15/2019   Procedure: COLONOSCOPY WITH PROPOFOL;  Surgeon: Graylin Shiver, MD;  Location: [facility];  Service: Endoscopy;  Laterality: N/A;   esi     L4-5 dr Channing Mutters 03/2010   ESOPHAGOGASTRODUODENOSCOPY     x2 - normal except reflux   KNEE SURGERY     both   LAPAROSCOPIC APPENDECTOMY  09/13/2012   Procedure: APPENDECTOMY LAPAROSCOPIC;  Surgeon: Almond Lint, MD;  Location: [facility];  Service: General;  Laterality: N/A;   ROTATOR CUFF REPAIR  08-20-10   left dr Wyline Mood complicated by PE   SHOULDER SURGERY     right and left       Family History  Problem Relation Age of Onset   Hypertension Other    Cancer Other        lung   Coronary artery disease Neg Hx     Social History   Tobacco Use   Smoking status: Never   Smokeless tobacco: Never   Tobacco comments:    never used product  Vaping Use   Vaping Use: Never used  Substance Use Topics   Alcohol use: No    Alcohol/week: 0.0 standard drinks   Drug use: No    Home Medications Prior to Admission medications   Medication Sig Start Date End Date Taking?  Authorizing Provider  ALPRAZolam Prudy Feeler) 1 MG tablet Take 1 tablet (1 mg total) by mouth 3 (three) times daily as needed for anxiety. 08/17/21   Nelwyn Salisbury, MD  diphenhydramine-acetaminophen (TYLENOL PM) 25-500 MG TABS Take 2 tablets by mouth at bedtime.    [provider]  folic acid (FOLVITE) 1 MG tablet Take 1 tablet (1 mg total) by mouth daily. 07/01/21   Nelwyn Salisbury, MD  levothyroxine (SYNTHROID) 75 MCG tablet Take 1 tablet (75 mcg total) by mouth daily. 07/02/20   Nelwyn Salisbury, MD  lisinopril-hydrochlorothiazide (ZESTORETIC) 20-12.5 MG tablet Take 1 tablet by mouth daily. 08/19/21   Nelwyn Salisbury, MD  metFORMIN (GLUCOPHAGE) 1000 MG tablet TAKE 1 TABLET BY MOUTH TWICE DAILY WITH MEALS 08/11/21   Nelwyn Salisbury, MD  methocarbamol (ROBAXIN) 500 MG tablet Take 1 tablet (500 mg total) by mouth every 8 (eight) hours as needed for muscle spasms. 08/26/20   Nelwyn Salisbury, MD  Multiple Vitamin (MULTIVITAMIN WITH MINERALS) TABS tablet Take 1 tablet by mouth once a week.     [provider]  omeprazole (PRILOSEC) 40 MG capsule Take 1 capsule (40 mg total) by mouth daily. Patient taking differently: No sig reported 02/02/21   Nelwyn Salisbury, MD  oxyCODONE-acetaminophen (PERCOCET) 10-325 MG tablet Take 1 tablet by mouth every 6 (six) hours as needed for pain. 09/07/21 10/07/21  Nelwyn Salisbury, MD  rivaroxaban (XARELTO) 20 MG TABS tablet Take 1 tablet (20 mg total) by mouth daily. 07/30/20   Nelwyn Salisbury, MD  Semaglutide,0.25 or 0.5MG /DOS, (OZEMPIC, 0.25 OR 0.5 MG/DOSE,) 2 MG/1.5ML SOPN Inject 0.5 mg into the skin once a week. 08/20/21   Nelwyn Salisbury, MD  sildenafil (VIAGRA) 100 MG tablet Take 1 tablet (100 mg total) by mouth daily as needed for erectile dysfunction. 03/13/20   Nelwyn Salisbury, MD  SYRINGE-NEEDLE, DISP, 3 ML (B-D SYRINGE/NEEDLE 3CC/22GX1.5) 22G X 1-1/2" 3 ML MISC Inject 0.5 mL into the muscle each week. 07/14/20   Nelwyn Salisbury, MD  testosterone cypionate (DEPOTESTOSTERONE  CYPIONATE) 200 MG/ML injection INJECT 1/2 (ONE-HALF) ML (CC) INTRAMUSCULARLY TWICE A WEEK 08/20/21   Nelwyn Salisbury, MD  Vitamin D, Ergocalciferol, (DRISDOL) 1.25 MG (50000 UNIT) CAPS capsule Take 1 capsule (50,000 Units total) by mouth every 7 (seven) days. 02/16/21   Nelwyn Salisbury, MD    Allergies    Lisinopril  Review of Systems   Review of Systems  Constitutional:  Negative for chills, diaphoresis, fatigue and fever.  HENT:  Negative for congestion.   Eyes:  Negative for visual disturbance.  Respiratory:  Positive for  chest tightness and shortness of breath. Negative for cough (no new cough over chronic cough) and wheezing.   Cardiovascular:  Positive for chest pain. Negative for palpitations, leg swelling, syncope and near-syncope.  Gastrointestinal:  Negative for abdominal pain, constipation, diarrhea, heartburn, nausea and vomiting.  Genitourinary:  Negative for dysuria and flank pain.  Musculoskeletal:  Negative for back pain, neck pain and neck stiffness.  Neurological:  Negative for dizziness, weakness, light-headedness, numbness and headaches.  Psychiatric/Behavioral:  Negative for agitation and confusion.   All other systems reviewed and are negative.  Physical Exam Updated Vital Signs BP 101/63   Pulse 87   Temp 98.4 F (36.9 C)   Resp 18   SpO2 94%   Physical Exam Vitals and nursing note reviewed.  Constitutional:      General: He is not in acute distress.    Appearance: He is well-developed. He is obese. He is not ill-appearing, toxic-appearing or diaphoretic.  HENT:     Head: Normocephalic and atraumatic.  Eyes:     Conjunctiva/sclera: Conjunctivae normal.  Cardiovascular:     Rate and Rhythm: Normal rate and regular rhythm.     Heart sounds: No murmur heard. Pulmonary:     Effort: Pulmonary effort is normal. Tachypnea present. No respiratory distress.     Breath sounds: Normal breath sounds.  Chest:     Chest wall: Tenderness present.  Abdominal:      Palpations: Abdomen is soft.     Tenderness: There is no abdominal tenderness.  Musculoskeletal:     Cervical back: Neck supple.     Right lower leg: Edema (reports at baseline) present.     Left lower leg: Edema (reports at baseline) present.  Skin:    General: Skin is warm and dry.     Capillary Refill: Capillary refill takes less than 2 seconds.  Neurological:     General: No focal deficit present.     Mental Status: He is alert.    ED Results / Procedures / Treatments   Labs (all labs ordered are listed, but only abnormal results are displayed) Labs Reviewed  BASIC METABOLIC PANEL - Abnormal; Notable for the following components:      Result Value   Glucose, Bld 109 (*)    All other components within normal limits  CBC - Abnormal; Notable for the following components:   RBC 5.83 (*)    Hemoglobin 17.2 (*)    HCT 52.9 (*)    All other components within normal limits  LIPASE, BLOOD - Abnormal; Notable for the following components:   Lipase 61 (*)    All other components within normal limits  HEPATIC FUNCTION PANEL - Abnormal; Notable for the following components:   Total Protein 6.4 (*)    ALT 74 (*)    All other components within normal limits  RESP PANEL BY RT-PCR (FLU A&B, COVID) ARPGX2  BRAIN NATRIURETIC PEPTIDE  TROPONIN I (HIGH SENSITIVITY)  TROPONIN I (HIGH SENSITIVITY)    EKG EKG Interpretation  Date/Time:  Sunday October 03 2021 18:55:17 EDT Ventricular Rate:  92 PR Interval:  136 QRS Duration: 90 QT Interval:  338 QTC Calculation: 417 R Axis:   -73 Text Interpretation: Normal sinus rhythm Pulmonary disease pattern Left anterior fascicular block Abnormal ECG When compared to prior, faster rate. no STEMI Confirmed by Theda Belfast ([id number]) on 10/03/2021 7:34:54 PM  Radiology CT Angio Chest PE W and/or Wo Contrast  Result Date: 10/03/2021 CLINICAL DATA:  Shortness of breath, chest discomfort,  evaluate for PE EXAM: CT ANGIOGRAPHY CHEST WITH CONTRAST  TECHNIQUE: Multidetector CT imaging of the chest was performed using the standard protocol during bolus administration of intravenous contrast. Multiplanar CT image reconstructions and MIPs were obtained to evaluate the vascular anatomy. CONTRAST:  OMNIPAQUE IOHEXOL 350 MG/ML SOLN COMPARISON:  Chest radiograph dated 10/03/2021. CTA chest dated 09/13/2015. FINDINGS: Cardiovascular: Suboptimal opacification due to bolus timing. Satisfactory opacification of the bilateral pulmonary arteries to the lobar level. No evidence of pulmonary embolism. Although not tailored for evaluation of the thoracic aorta, there is no evidence thoracic aortic aneurysm or dissection. The heart is top-normal in size.  No pericardial effusion. Mediastinum/Nodes: No suspicious mediastinal lymphadenopathy. Visualized thyroid is unremarkable. Lungs/Pleura: Evaluation lung parenchyma is constrained by respiratory motion. Within that constraint, there are no suspicious pulmonary nodules. No focal consolidation. No pleural effusion or pneumothorax. Upper Abdomen: Visualized upper abdomen is notable for moderate hepatic steatosis. Musculoskeletal: Degenerative changes of the visualized thoracolumbar spine. Review of the MIP images confirms the above findings. IMPRESSION: No evidence of pulmonary embolism. No evidence of acute cardiopulmonary disease. Electronically Signed   By: Charline Bills M.D.   On: 10/03/2021 21:22   DG Chest Port 1 View  Result Date: 10/03/2021 CLINICAL DATA:  Chest pain EXAM: PORTABLE CHEST 1 VIEW COMPARISON:  07/24/2016 FINDINGS: Heart and mediastinal contours are within normal limits. No focal opacities or effusions. No acute bony abnormality. IMPRESSION: No active disease. Electronically Signed   By: Charlett Nose M.D.   On: 10/03/2021 19:47    Procedures Procedures   Medications Ordered in ED Medications  iohexol (OMNIPAQUE) 350 MG/ML injection 100 mL (100 mLs Intravenous Contrast Given 10/03/21 2056)     ED Course  I have reviewed the triage vital signs and the nursing notes.  Pertinent labs & imaging results that were available during my care of the patient were reviewed by me and considered in my medical decision making (see chart for details).    MDM Rules/Calculators/A&P                           Gregory Cortez is a 65 y.o. male with a past medical history significant for diabetes, hypothyroidism, previous pulmonary emboli on chronic Xarelto use, anxiety, hypogonadism on testosterone injections, sleep apnea, and GERD who presents with chest pain and shortness of breath.  According to patient, for the last 3 days he has had pain in his central chest with significant shortness of breath.  He also reports he is getting more winded and fatigued with ambulation at baseline.  He reports his legs do not seem more swollen than baseline and he reports no new cough but he does report a chronic cough.  He reports that he has not had any syncopal episodes.  He says that this does not feel quite like when he had previous pulmonary embolism however during my initial conversation his oxygen saturations did drop to 86% with conversation with a good waveform.  Patient denies any fevers, chills, congestion, or any production of cough.  He reports the discomfort is more of a aching and tightness sensation.  He reports it was in his back but it is more in his chest.  Blood pressure is not elevated.  No leg pain, no numbness, tingling, weakness in extremities.  No history of aortic troubles.  No other complaints reported.  On exam, lungs are difficult auscultate due to body habitus however I did not hear any overt  rhonchi rales, or wheezing.  Chest was slightly tender to palpation in the central sternal chest and I did not appreciate a murmur.  Back was nontender.  No rashes seen.  Legs have some mild edema which he reports is at baseline.  He also has a small bump on his left great toe that he was taking a  antifungal cream for.  It is not significantly swollen edematous, and is not having any crepitance or significant tenderness.  Normal sensation and strength.  Good pulses in extremities.  Clinically I am concerned about the patient's new chest discomfort, shortness of breath, and hypoxia.  With his history of DVT and PE, I do feel need to get a CT PE study.  We will also add on a BNP given the edema I see on exam and the hypoxia.  Initial troponin was negative and EKG did not show STEMI.  Given his new hypoxia, I do anticipate he will likely need admission for further management.  Anticipate reassessment after work-up.  CT scan does not show evidence of pulmonary embolism however on my reassessment he is still having intermittent hypoxia.  Oxygen increased to 3 L to maintain oxygen saturations in the 90s.  Given his chest discomfort, shortness of breath, and hypoxia, do not feel he is safe for discharge home as he does not have oxygen at home.  Troponin was negative x2 and other work-up overall reassuring.  Patient will be admitted for new hypoxia of unknown etiology in the setting of chest pain and shortness of breath.  12:04 AM Patient was awaiting his bed for admission and decided he did not want to be admitted now.  He took his oxygen off and ambulate around the room without hypoxia and although he did have transient episodes of hypoxia here he would like to be discharged.  He understands the risks of worsening symptoms and will return immediately if symptoms were to worsen.  He will follow-up with his PCP.  Patient discharged with stable vital signs without current hypoxia for PCP follow-up for chest pain or shortness of breath.   Final Clinical Impression(s) / ED Diagnoses Final diagnoses:  Hypoxia  Chest pain, unspecified type  Shortness of breath    Clinical Impression: 1. Hypoxia   2. Chest pain   3. Chest pain, unspecified type   4. Shortness of breath     Disposition:  Admit  This note was prepared with assistance of Dragon voice recognition software. Occasional wrong-word or sound-a-like substitutions may have occurred due to the inherent limitations of voice recognition software.     Minetta Krisher, Canary Brim, MD 10/04/21 [phone]

## 2021-10-04 NOTE — ED Notes (Signed)
Pt did not want to get admitted. States that he has been watching his oxygen levels without oxygen on. Ambulated patient around the room and O2 dropped to 93%. Notified MD. Provider went to the bedside and addressed risks. Pt did not want to stay and wait for a bed, his back started to hurt and would like to be in his own bed with his CPAP machine.

## 2021-10-04 NOTE — ED Notes (Signed)
O2 sat on RA was 95% upon discharge. Chest pain improved.

## 2021-10-04 NOTE — ED Notes (Signed)
Pt verbalizes understanding of discharge instructions. Opportunity for questioning and answers were provided. Armand removed by staff, pt discharged from ED to home. Educated to f/u with PCP and to return with any worsening SOB or chest pain.

## 2021-10-04 NOTE — Discharge Instructions (Signed)
Your history, exam, work-up today did not identify a clear cause of the transient hypoxia you experienced in the setting of chest pain and shortness of breath.  Due to this, we initially planned on admitting you however now that you have demonstrated some stability without further hypoxia off of oxygen and wanted to go home, we felt it is reasonable to attempt home management however please have a low threshold to return and please follow-up with your primary doctor in the next several days.  If any symptoms change or worsen, please return to the nearest emergency department.

## 2021-10-07 ENCOUNTER — Other Ambulatory Visit: Payer: Self-pay | Admitting: Family Medicine

## 2021-11-01 ENCOUNTER — Other Ambulatory Visit: Payer: Self-pay | Admitting: Family Medicine

## 2021-11-01 NOTE — Telephone Encounter (Signed)
Pt LOV was on 07/07/2021 Last refill was done on 07/30/2020 for 90 tablets with 3 refills , please advise if ok to send Rx

## 2021-12-17 ENCOUNTER — Telehealth: Payer: Self-pay | Admitting: Family Medicine

## 2021-12-17 NOTE — Telephone Encounter (Signed)
Spoke with patient. Message given.

## 2021-12-17 NOTE — Telephone Encounter (Signed)
Patient called in to see if his Ozempic dosage could be increased.

## 2021-12-17 NOTE — Telephone Encounter (Signed)
Spoke with patient to obtain more information.    He stated that he is currently at a stand still with his weight, which he has currently lost 75 pounds, but no current weight loss within the past month.  Blood sugars has been around 90 to 100, he stated he has been doing well while on the medication.     Current dose of Ozempic is .25.    Please advise

## 2021-12-17 NOTE — Telephone Encounter (Signed)
We need to keep the dose as it is. I would be afraid of dropping his sugar too low

## 2021-12-21 ENCOUNTER — Other Ambulatory Visit: Payer: Self-pay

## 2021-12-21 ENCOUNTER — Other Ambulatory Visit: Payer: Self-pay | Admitting: Family Medicine

## 2021-12-21 MED ORDER — METFORMIN HCL 1000 MG PO TABS: 1000.0000 mg | ORAL_TABLET | Freq: Two times a day (BID) | ORAL | 1 refills | Status: DC

## 2021-12-24 ENCOUNTER — Ambulatory Visit (INDEPENDENT_AMBULATORY_CARE_PROVIDER_SITE_OTHER): Payer: BC Managed Care – PPO | Admitting: Family Medicine

## 2021-12-24 ENCOUNTER — Encounter: Payer: Self-pay | Admitting: Family Medicine

## 2021-12-24 VITALS — BP 120/70 | HR 72 | Temp 98.5°F | Wt 334.0 lb

## 2021-12-24 DIAGNOSIS — E1169 Type 2 diabetes mellitus with other specified complication: Secondary | ICD-10-CM | POA: Diagnosis not present

## 2021-12-24 DIAGNOSIS — E291 Testicular hypofunction: Secondary | ICD-10-CM | POA: Diagnosis not present

## 2021-12-24 DIAGNOSIS — E669 Obesity, unspecified: Secondary | ICD-10-CM

## 2021-12-24 DIAGNOSIS — K219 Gastro-esophageal reflux disease without esophagitis: Secondary | ICD-10-CM

## 2021-12-24 DIAGNOSIS — E039 Hypothyroidism, unspecified: Secondary | ICD-10-CM | POA: Diagnosis not present

## 2021-12-24 DIAGNOSIS — E66813 Obesity, class 3: Secondary | ICD-10-CM

## 2021-12-24 LAB — T4, FREE: Free T4: 0.65 ng/dL (ref 0.60–1.60)

## 2021-12-24 LAB — T3, FREE: T3, Free: 3.5 pg/mL (ref 2.3–4.2)

## 2021-12-24 LAB — TSH: TSH: 0.85 u[IU]/mL (ref 0.35–5.50)

## 2021-12-24 MED ORDER — OMEPRAZOLE 40 MG PO CPDR: 40.0000 mg | DELAYED_RELEASE_CAPSULE | Freq: Every day | ORAL | 3 refills | Status: DC

## 2021-12-24 MED ORDER — OZEMPIC (0.25 OR 0.5 MG/DOSE) 2 MG/1.5ML ~~LOC~~ SOPN: 0.5000 mg | PEN_INJECTOR | SUBCUTANEOUS | 11 refills | Status: DC

## 2021-12-24 NOTE — Progress Notes (Signed)
° °  Subjective:    Patient ID: Cristiano Capri, male    DOB: 28-Feb-1956, 66 y.o.   MRN: [id number]  HPI Here to follow up on issues. He feels great except for his knee and back pain. He has lost 73 lbs from last February. His BP is stable. He had labs drawn yesterday at [facility] showing HDL 27, LDL, 160, TG 121, total testosterone 675, free testosterone 186, and A1c 5.1.    Review of Systems  Constitutional: Negative.   Respiratory: Negative.    Cardiovascular: Negative.   Musculoskeletal:  Positive for arthralgias and back pain.      Objective:   Physical Exam Constitutional:      Appearance: He is obese.  Cardiovascular:     Rate and Rhythm: Normal rate and regular rhythm.     Pulses: Normal pulses.     Heart sounds: Normal heart sounds.  Pulmonary:     Effort: Pulmonary effort is normal.     Breath sounds: Normal breath sounds.  Neurological:     Mental Status: He is alert.          Assessment & Plan:  He is doing well as far as HTN, diabetes, hypogonadism and GERD are concerned. We will check a thyroid panel today.  [name], MD

## 2022-01-03 ENCOUNTER — Other Ambulatory Visit: Payer: Self-pay | Admitting: Hematology & Oncology

## 2022-02-09 DIAGNOSIS — M533 Sacrococcygeal disorders, not elsewhere classified: Secondary | ICD-10-CM | POA: Diagnosis not present

## 2022-02-17 ENCOUNTER — Other Ambulatory Visit: Payer: Self-pay | Admitting: Family Medicine

## 2022-02-18 NOTE — Telephone Encounter (Signed)
Last refill- 08/20/2021 ?No future OV scheduled ?

## 2022-02-21 ENCOUNTER — Other Ambulatory Visit: Payer: Self-pay | Admitting: Family Medicine

## 2022-02-22 NOTE — Telephone Encounter (Signed)
Last refill- 08/17/21--90 tabs, 5 refills ?Last OV- 12/24/2021 ? ?No future OV scheduled ?

## 2022-04-14 ENCOUNTER — Other Ambulatory Visit: Payer: Self-pay | Admitting: Hematology & Oncology

## 2022-04-14 ENCOUNTER — Other Ambulatory Visit: Payer: Self-pay | Admitting: Family Medicine

## 2022-04-14 NOTE — Telephone Encounter (Signed)
Pt requesting a refill of hydrocodone, not showing in his listing of medication.    ?[place], [place] Phone:  [phone]  ?Fax:  [phone]  ?  ? ?

## 2022-04-15 ENCOUNTER — Other Ambulatory Visit: Payer: Self-pay

## 2022-04-15 MED ORDER — XARELTO 20 MG PO TABS: 20.0000 mg | ORAL_TABLET | Freq: Every day | ORAL | 3 refills | Status: DC

## 2022-04-15 NOTE — Telephone Encounter (Signed)
Please refill Xarelto for one year. He will need a PMV for more pain medication  ?

## 2022-04-15 NOTE — Telephone Encounter (Signed)
Rx for Xarelto  was sent to pt pharmacy, pt has a MyChart appointment  scheduled for 04/19/2022 for PMV ?

## 2022-04-19 ENCOUNTER — Encounter: Payer: Self-pay | Admitting: Family Medicine

## 2022-04-19 ENCOUNTER — Telehealth (INDEPENDENT_AMBULATORY_CARE_PROVIDER_SITE_OTHER): Payer: BC Managed Care – PPO | Admitting: Family Medicine

## 2022-04-19 ENCOUNTER — Telehealth: Payer: Self-pay | Admitting: Family Medicine

## 2022-04-19 DIAGNOSIS — M25562 Pain in left knee: Secondary | ICD-10-CM

## 2022-04-19 DIAGNOSIS — F1190 Opioid use, unspecified, uncomplicated: Secondary | ICD-10-CM

## 2022-04-19 DIAGNOSIS — G8929 Other chronic pain: Secondary | ICD-10-CM

## 2022-04-19 DIAGNOSIS — M25561 Pain in right knee: Secondary | ICD-10-CM

## 2022-04-19 MED ORDER — OXYCODONE-ACETAMINOPHEN 10-325 MG PO TABS: 1.0000 | ORAL_TABLET | Freq: Four times a day (QID) | ORAL | 0 refills | Status: AC | PRN

## 2022-04-19 MED ORDER — OZEMPIC (1 MG/DOSE) 4 MG/3ML ~~LOC~~ SOPN: 1.0000 mg | PEN_INJECTOR | SUBCUTANEOUS | 3 refills | Status: DC

## 2022-04-19 MED ORDER — METFORMIN HCL 1000 MG PO TABS: 1000.0000 mg | ORAL_TABLET | Freq: Two times a day (BID) | ORAL | 3 refills | Status: DC

## 2022-04-19 NOTE — Telephone Encounter (Signed)
Pt called stating he had a VV today and forgot to tell the doc that he wanted to increase his Ozempic from 0.5 to 1mg .  ? ?Please advise.  ?

## 2022-04-19 NOTE — Telephone Encounter (Signed)
Called patient message given. 

## 2022-04-19 NOTE — Telephone Encounter (Signed)
Done

## 2022-04-19 NOTE — Telephone Encounter (Signed)
Ok to increase Ozempic per pt request ?

## 2022-04-19 NOTE — Progress Notes (Signed)
? ?Subjective:  ? ? Patient ID: Gregory Cortez, male    DOB: [birth year], 65 y.o.   MRN: [id number] ? ?HPI ?Virtual Visit via Video Note ? ?I connected with the patient on 04/19/22 at 10:45 AM EDT by a video enabled telemedicine application and verified that I am speaking with the correct person using two identifiers. ? Location patient: home ?Location provider:work or home office ?Persons participating in the virtual visit: patient, provider ? ?I discussed the limitations of evaluation and management by telemedicine and the availability of in person appointments. The patient expressed understanding and agreed to proceed. ? ? ?HPI: ?Here for pain management. His knee pains are about the same, but he continues to slowly lose weight. This has enabled him to cut back on the pain medication a little.  ? ? ?ROS: See pertinent positives and negatives per HPI. ? ?Past Medical History:  ?Diagnosis Date  ? Chest pain   ? Diabetes mellitus type 2 in obese (HCC) 11/29/2016  ? ED (erectile dysfunction)   ? GERD (gastroesophageal reflux disease)   ? [facility]  ? Hx of colonoscopy   ? Low back pain   ? dr Channing Mutters, dr Farris Has, dr Ethelene Hal, herniated disc L4-5  ? OSA (obstructive sleep apnea)   ? dr clance  ? PE (pulmonary embolism)   ? bilateral sep 2011 and again bilateral May 2012  ? Primary hypercoagulable state (HCC)   ? no etiology found per Dr. Shirline Frees   ? Pulmonary embolism (HCC) 02/22/2010  ? CT angio Dx  ? Testosterone deficiency   ? dr Patsi Sears, shots every 2 weeks  ? Thrombosis of arm   ? left arm 08/2010  ? ? ?Past Surgical History:  ?Procedure Laterality Date  ? APPENDECTOMY    ? cardiac stress test x 2     ? COLONOSCOPY  08-19-09  ? per Dr. Evette Cristal, clear, repeat in 10 yrs   ? COLONOSCOPY WITH PROPOFOL N/A 10/15/2019  ? Procedure: COLONOSCOPY WITH PROPOFOL;  Surgeon: Graylin Shiver, MD;  Location: [facility];  Service: Endoscopy;  Laterality: N/A;  ? esi    ? L4-5 dr Channing Mutters 03/2010  ? ESOPHAGOGASTRODUODENOSCOPY    ? x2 - normal  except reflux  ? KNEE SURGERY    ? both  ? LAPAROSCOPIC APPENDECTOMY  09/13/2012  ? Procedure: APPENDECTOMY LAPAROSCOPIC;  Surgeon: Almond Lint, MD;  Location: [facility];  Service: General;  Laterality: N/A;  ? ROTATOR CUFF REPAIR  08-20-10  ? left dr Wyline Mood complicated by PE  ? SHOULDER SURGERY    ? right and left  ? ? ?Family History  ?Problem Relation Age of Onset  ? Hypertension Other   ? Cancer Other   ?     lung  ? Coronary artery disease Neg Hx   ? ? ? ?Current Outpatient Medications:  ?  ALPRAZolam (XANAX) 1 MG tablet, TAKE 1 TABLET BY MOUTH THREE TIMES DAILY AS NEEDED FOR ANXIETY, Disp: 90 tablet, Rfl: 5 ?  diphenhydramine-acetaminophen (TYLENOL PM) 25-500 MG TABS, Take 2 tablets by mouth at bedtime., Disp: , Rfl:  ?  folic acid (FOLVITE) 1 MG tablet, Take 1 tablet by mouth once daily, Disp: 90 tablet, Rfl: 0 ?  lisinopril-hydrochlorothiazide (ZESTORETIC) 20-12.5 MG tablet, Take 1 tablet by mouth daily., Disp: 30 tablet, Rfl: 11 ?  methocarbamol (ROBAXIN) 500 MG tablet, Take 1 tablet (500 mg total) by mouth every 8 (eight) hours as needed for muscle spasms., Disp: 270 tablet, Rfl: 3 ?  Multiple Vitamin (  MULTIVITAMIN WITH MINERALS) TABS tablet, Take 1 tablet by mouth once a week. , Disp: , Rfl:  ?  omeprazole (PRILOSEC) 40 MG capsule, Take 1 capsule (40 mg total) by mouth daily., Disp: 90 capsule, Rfl: 3 ?  oxyCODONE-acetaminophen (PERCOCET) 10-325 MG tablet, Take 1 tablet by mouth every 6 (six) hours as needed for pain., Disp: 120 tablet, Rfl: 0 ?  Semaglutide,0.25 or 0.5MG /DOS, (OZEMPIC, 0.25 OR 0.5 MG/DOSE,) 2 MG/1.5ML SOPN, Inject 0.5 mg into the skin once a week., Disp: 3 mL, Rfl: 11 ?  sildenafil (VIAGRA) 100 MG tablet, Take 1 tablet (100 mg total) by mouth daily as needed for erectile dysfunction., Disp: 30 tablet, Rfl: 3 ?  SYRINGE-NEEDLE, DISP, 3 ML (B-D SYRINGE/NEEDLE 3CC/22GX1.5) 22G X 1-1/2" 3 ML MISC, Inject 0.5 mL into the muscle each week., Disp: 8 each, Rfl: 3 ?  testosterone cypionate  (DEPO-TESTOSTERONE) 200 MG/ML injection, INJECT  0.5 ML (CC) INTRAMUSCULARLY TWICE A WEEK, Disp: 10 mL, Rfl: 5 ?  Vitamin D, Ergocalciferol, (DRISDOL) 1.25 MG (50000 UNIT) CAPS capsule, Take 1 capsule (50,000 Units total) by mouth every 7 (seven) days., Disp: 12 capsule, Rfl: 3 ?  XARELTO 20 MG TABS tablet, Take 1 tablet (20 mg total) by mouth daily., Disp: 90 tablet, Rfl: 3 ?  metFORMIN (GLUCOPHAGE) 1000 MG tablet, Take 1 tablet (1,000 mg total) by mouth 2 (two) times daily with a meal., Disp: 180 tablet, Rfl: 3 ? ?EXAM: ? ?VITALS per patient if applicable: ? ?GENERAL: alert, oriented, appears well and in no acute distress ? ?HEENT: atraumatic, conjunttiva clear, no obvious abnormalities on inspection of external nose and ears ? ?NECK: normal movements of the head and neck ? ?LUNGS: on inspection no signs of respiratory distress, breathing rate appears normal, no obvious gross SOB, gasping or wheezing ? ?CV: no obvious cyanosis ? ?MS: moves all visible extremities without noticeable abnormality ? ?PSYCH/NEURO: pleasant and cooperative, no obvious depression or anxiety, speech and thought processing grossly intact ? ?ASSESSMENT AND PLAN: ?Pain management. ?Indication for chronic opioid: knee pain ?Medication and dose: Percocet 10-325 ?# pills per month: 120 ?Last UDS date: 12-28-20 ?Opioid Treatment Agreement signed (Y/N): 01-28-20 ?Opioid Treatment Agreement last reviewed with patient:  04-19-22 ?NCCSRS reviewed this encounter (include red flags): Yes ?We refilled the Percocet for one month only. He is due for another urine drug screen, so I asked him to stop by the lab for this. We will not refill the medication any further unless this is done. He understands.  ?[name], MD ? ?Discussed the following assessment and plan: ? ?No diagnosis found. ? ? ?  ?I discussed the assessment and treatment plan with the patient. The patient was provided an opportunity to ask questions and all were answered. The patient agreed  with the plan and demonstrated an understanding of the instructions. ?  ?The patient was advised to call back or seek an in-person evaluation if the symptoms worsen or if the condition fails to improve as anticipated. ? ?  ? ? ?Review of Systems ? ?   ?Objective:  ? Physical Exam ? ? ? ? ?   ?Assessment & Plan:  ? ? ?

## 2022-04-28 ENCOUNTER — Telehealth: Payer: Self-pay

## 2022-04-28 NOTE — Telephone Encounter (Signed)
Pt PA for Ozempic was submitted to plan, received the following message.Your information has been submitted and will be reviewed by [place]. You may close this dialog, return to your dashboard, and perform other tasks. An electronic determination will be received in CoverMyMeds within 72-120 hours. You can see the latest determination by locating this request on your dashboard or by reopening this request. You will receive a fax copy of the determination. If Rosann Auerbach has not responded in 120 hours, contact [employer] at [phone]. ?

## 2022-06-27 ENCOUNTER — Telehealth: Payer: Self-pay | Admitting: Family Medicine

## 2022-06-27 MED ORDER — OXYCODONE-ACETAMINOPHEN 5-325 MG PO TABS: 1.0000 | ORAL_TABLET | Freq: Four times a day (QID) | ORAL | 0 refills | Status: AC | PRN

## 2022-06-27 NOTE — Telephone Encounter (Signed)
[facility] with [facility]  is calling and the have a rx for percocet 10 mg that was date for may 01-2022 and they can not fill that high dose however they can fill  percocet 5 mg one pill every six hours #20 for 5 days. Please advise

## 2022-06-27 NOTE — Telephone Encounter (Signed)
Please advise 

## 2022-06-27 NOTE — Telephone Encounter (Signed)
I sent in for #20 of the 5 mg dose

## 2022-06-28 DIAGNOSIS — M25511 Pain in right shoulder: Secondary | ICD-10-CM | POA: Diagnosis not present

## 2022-06-28 NOTE — Telephone Encounter (Signed)
Spoke with Edie the pharmacist, informed her of the message below and she stated the Rx was received.

## 2022-06-29 DIAGNOSIS — S46911D Strain of unspecified muscle, fascia and tendon at shoulder and upper arm level, right arm, subsequent encounter: Secondary | ICD-10-CM | POA: Diagnosis not present

## 2022-07-01 ENCOUNTER — Telehealth: Payer: Self-pay | Admitting: Family Medicine

## 2022-07-01 NOTE — Telephone Encounter (Signed)
Pt has a PMV scheduled for 07/07/2022

## 2022-07-01 NOTE — Telephone Encounter (Signed)
He was informed on 04-19-22 that he was past due for a urine drug screen.  I cannot prescribe him ANY more narcotics until he has a PMV and has a urine drug screen performed.

## 2022-07-01 NOTE — Telephone Encounter (Signed)
Pt asking why he only received 20 tablets of oxyCODONE-acetaminophen (PERCOCET/ROXICET) 5-325 MG tablet

## 2022-07-01 NOTE — Telephone Encounter (Signed)
Please advise 

## 2022-07-06 ENCOUNTER — Encounter: Payer: Self-pay | Admitting: Family Medicine

## 2022-07-06 ENCOUNTER — Telehealth: Payer: Self-pay

## 2022-07-06 ENCOUNTER — Ambulatory Visit (INDEPENDENT_AMBULATORY_CARE_PROVIDER_SITE_OTHER): Payer: BC Managed Care – PPO | Admitting: Family Medicine

## 2022-07-06 VITALS — BP 126/74 | HR 75 | Temp 98.5°F | Wt 332.0 lb

## 2022-07-06 DIAGNOSIS — G8929 Other chronic pain: Secondary | ICD-10-CM | POA: Diagnosis not present

## 2022-07-06 DIAGNOSIS — M25562 Pain in left knee: Secondary | ICD-10-CM | POA: Diagnosis not present

## 2022-07-06 DIAGNOSIS — F1190 Opioid use, unspecified, uncomplicated: Secondary | ICD-10-CM

## 2022-07-06 DIAGNOSIS — M25561 Pain in right knee: Secondary | ICD-10-CM | POA: Diagnosis not present

## 2022-07-06 MED ORDER — OXYCODONE-ACETAMINOPHEN 10-325 MG PO TABS: 1.0000 | ORAL_TABLET | Freq: Four times a day (QID) | ORAL | 0 refills | Status: DC | PRN

## 2022-07-06 MED ORDER — OXYCODONE-ACETAMINOPHEN 10-325 MG PO TABS: 1.0000 | ORAL_TABLET | Freq: Four times a day (QID) | ORAL | 0 refills | Status: AC | PRN

## 2022-07-06 NOTE — Telephone Encounter (Signed)
Pt PA for oxycodone was approved Approvedtoday PA Case: [id number], Status: Approved, Coverage Starts on: 07/06/2022 12:00:00 AM, Coverage Ends on: 01/02/2023 12:00:00 AM.

## 2022-07-06 NOTE — Progress Notes (Signed)
   Subjective:    Patient ID: Gregory Cortez, male    DOB: 01-02-1956, 66 y.o.   MRN: [id number]  HPI Here for pain management. His knee pain is about the same. He is still slowly but steadily losing weight. Unfortunately he recently tore the biceps muscle in the right arm, and he is seeing Orthopedics for this.    Review of Systems  Constitutional: Negative.   Musculoskeletal:  Positive for arthralgias.       Objective:   Physical Exam Constitutional:      Appearance: Normal appearance.  Neurological:     Mental Status: He is alert.           Assessment & Plan:  Pain management.  Indication for chronic opioid: knee pain  Medication and dose: Percocet  10-325 # pills per month: 120 Last UDS date: 12-28-20 Opioid Treatment Agreement signed (Y/N): 01-28-20 Opioid Treatment Agreement last reviewed with patient:  07-06-22 NCCSRS reviewed this encounter (include red flags): Yes Meds were refilled.  [name], MD

## 2022-07-07 ENCOUNTER — Ambulatory Visit: Payer: BC Managed Care – PPO | Admitting: Family Medicine

## 2022-07-07 LAB — DRUG MONITOR, PANEL 1, W/CONF, URINE
Amphetamines: NEGATIVE ng/mL (ref <500)
Barbiturates: NEGATIVE ng/mL (ref <300)
Benzodiazepines: NEGATIVE ng/mL (ref <100)
Cocaine Metabolite: NEGATIVE ng/mL (ref <150)
Creatinine: 53.5 mg/dL (ref >=20.0)
Marijuana Metabolite: NEGATIVE ng/mL (ref <20)
Methadone Metabolite: NEGATIVE ng/mL (ref <100)
Opiates: NEGATIVE ng/mL (ref <100)
Oxidant: NEGATIVE ug/mL (ref <200)
Oxycodone: NEGATIVE ng/mL (ref <100)
Phencyclidine: NEGATIVE ng/mL (ref <25)
pH: 6.3 (ref 4.5–9.0)

## 2022-07-07 LAB — DM TEMPLATE

## 2022-07-15 ENCOUNTER — Telehealth: Payer: Self-pay

## 2022-07-15 NOTE — Telephone Encounter (Signed)
Spoke with pt pharmacy state that Rx was filled and picked up by pt

## 2022-08-09 ENCOUNTER — Other Ambulatory Visit: Payer: Self-pay | Admitting: Orthopaedic Surgery

## 2022-08-09 DIAGNOSIS — Z01818 Encounter for other preprocedural examination: Secondary | ICD-10-CM

## 2022-08-09 DIAGNOSIS — S46911D Strain of unspecified muscle, fascia and tendon at shoulder and upper arm level, right arm, subsequent encounter: Secondary | ICD-10-CM | POA: Diagnosis not present

## 2022-08-15 LAB — HEPATIC FUNCTION PANEL
ALT: 36 U/L (ref 10–40)
AST: 26 (ref 14–40)
Alkaline Phosphatase: 60 (ref 25–125)
Bilirubin, Total: 0.8

## 2022-08-15 LAB — CBC AND DIFFERENTIAL
HCT: 49 (ref 41–53)
Hemoglobin: 15.7 (ref 13.5–17.5)
Platelets: 286 10*3/uL (ref 150–400)
WBC: 5.5

## 2022-08-15 LAB — CBC: RBC: 5.45 — AB (ref 3.87–5.11)

## 2022-08-15 LAB — LIPID PANEL
Cholesterol: 201 — AB (ref 0–200)
HDL: 33 — AB (ref 35–70)
Triglycerides: 83 (ref 40–160)

## 2022-08-15 LAB — TESTOSTERONE: Testosterone: 725

## 2022-08-15 LAB — BASIC METABOLIC PANEL
BUN: 15 (ref 4–21)
CO2: 20 (ref 13–22)
Chloride: 102 (ref 99–108)
Creatinine: 0.8 (ref 0.6–1.3)
Glucose: 91
Potassium: 4.2 mEq/L (ref 3.5–5.1)
Sodium: 138 (ref 137–147)

## 2022-08-15 LAB — TSH: TSH: 0.98 (ref 0.41–5.90)

## 2022-08-15 LAB — PSA: PSA: 1.5

## 2022-08-15 LAB — VITAMIN D 25 HYDROXY (VIT D DEFICIENCY, FRACTURES): Vit D, 25-Hydroxy: 57.7

## 2022-08-15 LAB — COMPREHENSIVE METABOLIC PANEL
Albumin: 4.5 (ref 3.5–5.0)
Calcium: 9.5 (ref 8.7–10.7)
Globulin: 2.4
eGFR: 98

## 2022-08-19 ENCOUNTER — Encounter: Payer: Self-pay | Admitting: Family Medicine

## 2022-08-19 ENCOUNTER — Other Ambulatory Visit: Payer: Self-pay | Admitting: Family Medicine

## 2022-08-19 ENCOUNTER — Telehealth: Payer: Self-pay

## 2022-08-19 NOTE — Telephone Encounter (Signed)
Caller reports patient has low blood pressure 83/50. Reports dizziness.  08/17/2022 1:24:07 PM Call EMS 911 Now [place], RN, [place] Client  Referrals GO TO FACILITY REFUSED  91/23 at 1120 - Pt states he's been feeling dizziness for "a couple of weeks" before he realized it could be his hypertensive medication. Pt has held med since then & BP 114/70, 120/68 (today) after holding BP medication. Pt will continue to hold medication.  Of note: no CPE noted in the last year. Appt scheduled for CPE 08/24/22; pt will send mychart message with [facility] labs collected last week.

## 2022-08-24 ENCOUNTER — Ambulatory Visit (INDEPENDENT_AMBULATORY_CARE_PROVIDER_SITE_OTHER): Payer: BC Managed Care – PPO | Admitting: Family Medicine

## 2022-08-24 ENCOUNTER — Encounter: Payer: Self-pay | Admitting: Family Medicine

## 2022-08-24 VITALS — BP 120/80 | HR 75 | Temp 98.4°F | Ht 70.0 in | Wt 330.0 lb

## 2022-08-24 DIAGNOSIS — K921 Melena: Secondary | ICD-10-CM | POA: Diagnosis not present

## 2022-08-24 DIAGNOSIS — Z0000 Encounter for general adult medical examination without abnormal findings: Secondary | ICD-10-CM | POA: Diagnosis not present

## 2022-08-24 MED ORDER — FOLIC ACID 1 MG PO TABS: 1.0000 mg | ORAL_TABLET | Freq: Every day | ORAL | 3 refills | Status: DC

## 2022-08-24 NOTE — Progress Notes (Signed)
Subjective:    Patient ID: Gregory Cortez, male    DOB: 1956/03/30, 66 y.o.   MRN: [id number]  HPI Here for a well exam. He feels well in general, and he continues to slowly lose weight on his keto diet. His main concern is passing blood when he has a stool. This has been going on for years, and it has always been presumed to come from internal hemorrhoids. His last colonoscopy was in 2020, and this was clear except for internal hemorrhoids. He had been seeing Dr. Wandalee Ferdinand, but since he retired Gregory Cortez asks to see someone in the [facility] group. He plans to have a total right shoulder arthroplasty sometime later this year. He had complete labs drawn on 08-15-22, and we reviewed these together today. These were remarkable for an A1c of 5.2 and an LDL of 152.    Review of Systems  Constitutional: Negative.   HENT: Negative.    Eyes: Negative.   Respiratory: Negative.    Cardiovascular: Negative.   Gastrointestinal:  Positive for blood in stool.  Genitourinary: Negative.   Musculoskeletal:  Positive for arthralgias.  Skin: Negative.   Neurological: Negative.   Psychiatric/Behavioral: Negative.         Objective:   Physical Exam Constitutional:      General: He is not in acute distress.    Appearance: He is well-developed. He is obese. He is not diaphoretic.  HENT:     Head: Normocephalic and atraumatic.     Right Ear: External ear normal.     Left Ear: External ear normal.     Nose: Nose normal.     Mouth/Throat:     Pharynx: No oropharyngeal exudate.  Eyes:     General: No scleral icterus.       Right eye: No discharge.        Left eye: No discharge.     Conjunctiva/sclera: Conjunctivae normal.     Pupils: Pupils are equal, round, and reactive to light.  Neck:     Thyroid: No thyromegaly.     Vascular: No JVD.     Trachea: No tracheal deviation.  Cardiovascular:     Rate and Rhythm: Normal rate and regular rhythm.     Heart sounds: Normal heart sounds. No murmur heard.     No friction rub. No gallop.  Pulmonary:     Effort: Pulmonary effort is normal. No respiratory distress.     Breath sounds: Normal breath sounds. No wheezing or rales.  Chest:     Chest wall: No tenderness.  Abdominal:     General: Bowel sounds are normal. There is no distension.     Palpations: Abdomen is soft. There is no mass.     Tenderness: There is no abdominal tenderness. There is no guarding or rebound.  Genitourinary:    Penis: Normal. No tenderness.      Testes: Normal.  Musculoskeletal:        General: No tenderness. Normal range of motion.     Cervical back: Neck supple.  Lymphadenopathy:     Cervical: No cervical adenopathy.  Skin:    General: Skin is warm and dry.     Coloration: Skin is not pale.     Findings: No erythema or rash.  Neurological:     Mental Status: He is alert and oriented to person, place, and time.     Cranial Nerves: No cranial nerve deficit.     Motor: No abnormal muscle tone.  Coordination: Coordination normal.     Deep Tendon Reflexes: Reflexes are normal and symmetric. Reflexes normal.  Psychiatric:        Behavior: Behavior normal.        Thought Content: Thought content normal.        Judgment: Judgment normal.           Assessment & Plan:  Well exam. We discussed diet and exercise. Refer to [facility] for the bloody stools. He will follow up with orthopedics for the shoulder.  [name], MD

## 2022-08-28 ENCOUNTER — Other Ambulatory Visit: Payer: Self-pay | Admitting: Family Medicine

## 2022-08-29 NOTE — Telephone Encounter (Signed)
Last OV CPE-08-24-2022 Last refill--02-23-2022-90 tabs, 5 refills  No future OV scheduled.

## 2022-08-30 ENCOUNTER — Other Ambulatory Visit: Payer: BC Managed Care – PPO

## 2022-09-01 ENCOUNTER — Other Ambulatory Visit: Payer: BC Managed Care – PPO

## 2022-09-06 ENCOUNTER — Ambulatory Visit
Admission: RE | Admit: 2022-09-06 | Discharge: 2022-09-06 | Disposition: A | Discharge status: Home / Self Care | Payer: BC Managed Care – PPO | Source: Ambulatory Visit | Attending: Orthopaedic Surgery | Admitting: Orthopaedic Surgery

## 2022-09-06 DIAGNOSIS — M25511 Pain in right shoulder: Secondary | ICD-10-CM | POA: Diagnosis not present

## 2022-09-06 DIAGNOSIS — Z01818 Encounter for other preprocedural examination: Secondary | ICD-10-CM

## 2022-09-20 DIAGNOSIS — S46911D Strain of unspecified muscle, fascia and tendon at shoulder and upper arm level, right arm, subsequent encounter: Secondary | ICD-10-CM | POA: Diagnosis not present

## 2022-12-27 ENCOUNTER — Ambulatory Visit (INDEPENDENT_AMBULATORY_CARE_PROVIDER_SITE_OTHER): Payer: BC Managed Care – PPO | Admitting: Family Medicine

## 2022-12-27 ENCOUNTER — Encounter: Payer: Self-pay | Admitting: Family Medicine

## 2022-12-27 VITALS — BP 110/66 | HR 88 | Temp 98.2°F | Wt 345.4 lb

## 2022-12-27 DIAGNOSIS — U071 COVID-19: Secondary | ICD-10-CM | POA: Diagnosis not present

## 2022-12-27 DIAGNOSIS — J40 Bronchitis, not specified as acute or chronic: Secondary | ICD-10-CM | POA: Diagnosis not present

## 2022-12-27 MED ORDER — AZITHROMYCIN 250 MG PO TABS: ORAL_TABLET | ORAL | 0 refills | Status: DC

## 2022-12-27 MED ORDER — METHYLPREDNISOLONE 4 MG PO TBPK: ORAL_TABLET | ORAL | 0 refills | Status: DC

## 2022-12-27 NOTE — Progress Notes (Signed)
   Subjective:    Patient ID: Gregory Cortez, male    DOB: Nov 08, 1956, 67 y.o.   MRN: [id number]  HPI Here for 2 weeks of fatigue, a dry cough, and SOB. No fever or chest pain. When it started he tested positive for the Covid virus, and at that time he also had nausea and body aches. These symptoms have resolved but he still has the cough and the SOB. Now he also hears rattling in his chest when he lies down. Taking Tylenol Sever Cough and Cold.    Review of Systems  Constitutional: Negative.   HENT:  Positive for congestion. Negative for ear pain, postnasal drip, sinus pressure and sore throat.   Eyes: Negative.   Respiratory:  Positive for cough and shortness of breath. Negative for wheezing.        Objective:   Physical Exam Constitutional:      Appearance: Normal appearance. He is not ill-appearing.  HENT:     Right Ear: Tympanic membrane, ear canal and external ear normal.     Left Ear: Tympanic membrane, ear canal and external ear normal.     Nose: Nose normal.     Mouth/Throat:     Pharynx: Oropharynx is clear.  Eyes:     Conjunctiva/sclera: Conjunctivae normal.  Pulmonary:     Effort: Pulmonary effort is normal. No respiratory distress.     Breath sounds: Rhonchi present. No wheezing or rales.  Lymphadenopathy:     Cervical: No cervical adenopathy.  Neurological:     Mental Status: He is alert.           Assessment & Plan:  He has been dealing with a Covid infection and he now has a secondary bronchitis. Treat with a Zpack and a Medrol dose pack. Recheck as needed.  Alysia Penna, MD

## 2022-12-30 ENCOUNTER — Other Ambulatory Visit: Payer: Self-pay | Admitting: Family Medicine

## 2023-01-02 NOTE — Telephone Encounter (Signed)
Last Testosterone labs-08/15/22 Last OV-12/27/22 Last refill-02/18/22

## 2023-02-28 ENCOUNTER — Other Ambulatory Visit: Payer: Self-pay | Admitting: Family Medicine

## 2023-03-01 NOTE — Telephone Encounter (Signed)
Last refill-08/30/22--90 tabs,5 refills Last OV-12/27/22  No future OV scheduled.

## 2023-03-02 ENCOUNTER — Telehealth: Payer: Self-pay | Admitting: Family Medicine

## 2023-03-02 NOTE — Telephone Encounter (Signed)
Prescription Request  03/02/2023  LOV: 12/27/2022  What is the name of the medication or equipment? oxyCODONE-acetaminophen (PERCOCET) 10-325 MG tablet   Have you contacted your pharmacy to request a refill? No   Which pharmacy would you like this sent to?  [place], [place] [address] Phone: [phone] Fax: [phone]     Patient notified that their request is being sent to the clinical staff for review and that they should receive a response within 2 business days.   Please advise at Mobile [phone] (mobile)

## 2023-03-03 NOTE — Telephone Encounter (Signed)
He needs a PMV  

## 2023-03-03 NOTE — Telephone Encounter (Signed)
Pt has been scheduled for MyChart Video visit on 03/07/23 at 4pm

## 2023-03-03 NOTE — Telephone Encounter (Signed)
Last OV-12/27/22  Pharmacy updated to [facility]

## 2023-03-07 ENCOUNTER — Encounter: Payer: Self-pay | Admitting: Family Medicine

## 2023-03-07 ENCOUNTER — Telehealth (INDEPENDENT_AMBULATORY_CARE_PROVIDER_SITE_OTHER): Payer: BC Managed Care – PPO | Admitting: Family Medicine

## 2023-03-07 DIAGNOSIS — F1190 Opioid use, unspecified, uncomplicated: Secondary | ICD-10-CM

## 2023-03-07 DIAGNOSIS — M25562 Pain in left knee: Secondary | ICD-10-CM | POA: Diagnosis not present

## 2023-03-07 DIAGNOSIS — G8929 Other chronic pain: Secondary | ICD-10-CM | POA: Diagnosis not present

## 2023-03-07 DIAGNOSIS — M25561 Pain in right knee: Secondary | ICD-10-CM | POA: Diagnosis not present

## 2023-03-07 MED ORDER — OXYCODONE-ACETAMINOPHEN 10-325 MG PO TABS: 1.0000 | ORAL_TABLET | Freq: Four times a day (QID) | ORAL | 0 refills | Status: DC | PRN

## 2023-03-07 MED ORDER — OXYCODONE-ACETAMINOPHEN 10-325 MG PO TABS: 1.0000 | ORAL_TABLET | Freq: Four times a day (QID) | ORAL | 0 refills | Status: AC | PRN

## 2023-03-07 NOTE — Progress Notes (Signed)
Subjective:    Patient ID: Gregory Cortez, male    DOB: 1956-06-15, 67 y.o.   MRN: [id number]  HPI Virtual Visit via Video Note  I connected with the patient on 03/07/23 at  4:00 PM EDT by a video enabled telemedicine application and verified that I am speaking with the correct person using two identifiers.  Location patient: home Location provider:work or home office Persons participating in the virtual visit: patient, provider  I discussed the limitations of evaluation and management by telemedicine and the availability of in person appointments. The patient expressed understanding and agreed to proceed.   HPI: Here for pain management, he is doing well. He is still working on losing weight, and he has 50 lbs to go before he can be scheduled for knee surgery.    ROS: See pertinent positives and negatives per HPI.  Past Medical History:  Diagnosis Date   Chest pain    Diabetes mellitus type 2 in obese ([place]) 11/29/2016   ED (erectile dysfunction)    GERD (gastroesophageal reflux disease)    [facility]   Hx of colonoscopy    Low back pain    dr Carloyn Manner, dr Alfonso Ramus, dr Nelva Bush, herniated disc L4-5   OSA (obstructive sleep apnea)    dr Gwenette Greet   PE (pulmonary embolism)    bilateral sep 2011 and again bilateral May 2012   Primary hypercoagulable state [facility])    no etiology found per Dr. Earlie Server    Pulmonary embolism ([place]) 02/22/2010   CT angio Dx   Testosterone deficiency    dr Gaynelle Arabian, shots every 2 weeks   Thrombosis of arm    left arm 08/2010    Past Surgical History:  Procedure Laterality Date   APPENDECTOMY     cardiac stress test x 2      COLONOSCOPY  08-19-09   per Dr. Penelope Coop, clear, repeat in 10 yrs    COLONOSCOPY WITH PROPOFOL N/A 10/15/2019   Procedure: COLONOSCOPY WITH PROPOFOL;  Surgeon: Wonda Horner, MD;  Location: [facility];  Service: Endoscopy;  Laterality: N/A;   esi     L4-5 dr Carloyn Manner 03/2010   ESOPHAGOGASTRODUODENOSCOPY     x2 - normal except reflux   KNEE  SURGERY     both   LAPAROSCOPIC APPENDECTOMY  09/13/2012   Procedure: APPENDECTOMY LAPAROSCOPIC;  Surgeon: Stark Klein, MD;  Location: [facility];  Service: General;  Laterality: N/A;   ROTATOR CUFF REPAIR  08-20-10   left dr Para March complicated by PE   SHOULDER SURGERY     right and left    Family History  Problem Relation Age of Onset   Hypertension Other    Cancer Other        lung   Coronary artery disease Neg Hx      Current Outpatient Medications:    ALPRAZolam (XANAX) 1 MG tablet, TAKE 1 TABLET BY MOUTH THREE TIMES DAILY AS NEEDED FOR ANXIETY, Disp: 90 tablet, Rfl: 5   azithromycin (ZITHROMAX Z-PAK) 250 MG tablet, As directed, Disp: 6 each, Rfl: 0   Cholecalciferol (VITAMIN D-3 PO), Take by mouth daily. With Vitamin K, Disp: , Rfl:    diphenhydramine-acetaminophen (TYLENOL PM) 25-500 MG TABS, Take 2 tablets by mouth at bedtime., Disp: , Rfl:    folic acid (FOLVITE) 1 MG tablet, Take 1 tablet (1 mg total) by mouth daily., Disp: 90 tablet, Rfl: 3   metFORMIN (GLUCOPHAGE) 1000 MG tablet, Take 1 tablet (1,000 mg total) by mouth 2 (two)  times daily with a meal., Disp: 180 tablet, Rfl: 3   methocarbamol (ROBAXIN) 500 MG tablet, Take 1 tablet (500 mg total) by mouth every 8 (eight) hours as needed for muscle spasms., Disp: 270 tablet, Rfl: 3   methylPREDNISolone (MEDROL DOSEPAK) 4 MG TBPK tablet, As directed, Disp: 21 tablet, Rfl: 0   Multiple Vitamin (MULTIVITAMIN WITH MINERALS) TABS tablet, Take 1 tablet by mouth once a week. , Disp: , Rfl:    omeprazole (PRILOSEC) 40 MG capsule, Take 1 capsule (40 mg total) by mouth daily., Disp: 90 capsule, Rfl: 3   oxyCODONE-acetaminophen (PERCOCET) 10-325 MG tablet, Take 1 tablet by mouth every 6 (six) hours as needed., Disp: , Rfl:    Semaglutide, 1 MG/DOSE, (OZEMPIC, 1 MG/DOSE,) 4 MG/3ML SOPN, Inject 1 mg into the skin once a week., Disp: 12 mL, Rfl: 3   SYRINGE-NEEDLE, DISP, 3 ML (B-D SYRINGE/NEEDLE 3CC/22GX1.5) 22G X 1-1/2" 3 ML MISC, Inject 0.5 mL  into the muscle each week., Disp: 8 each, Rfl: 3   testosterone cypionate (DEPOTESTOSTERONE CYPIONATE) 200 MG/ML injection, INJECT  0.5 ML (CC) INTRAMUSCULARLY TWICE A WEEK, Disp: 10 mL, Rfl: 1   XARELTO 20 MG TABS tablet, Take 1 tablet (20 mg total) by mouth daily., Disp: 90 tablet, Rfl: 3  EXAM:  VITALS per patient if applicable:  GENERAL: alert, oriented, appears well and in no acute distress  HEENT: atraumatic, conjunttiva clear, no obvious abnormalities on inspection of external nose and ears  NECK: normal movements of the head and neck  LUNGS: on inspection no signs of respiratory distress, breathing rate appears normal, no obvious gross SOB, gasping or wheezing  CV: no obvious cyanosis  MS: moves all visible extremities without noticeable abnormality  PSYCH/NEURO: pleasant and cooperative, no obvious depression or anxiety, speech and thought processing grossly intact  ASSESSMENT AND PLAN: Pain management. Indication for chronic opioid: knee pain Medication and dose: Percocet 10-325 # pills per month: 120 Last UDS date: 07-06-22 Opioid Treatment Agreement signed (Y/N): 01-28-20 Opioid Treatment Agreement last reviewed with patient:  3=19-24 Meds were refilled.  Alysia Penna, MD  [place] reviewed this encounter (include red flags): Yes   Discussed the following assessment and plan:  No diagnosis found.     I discussed the assessment and treatment plan with the patient. The patient was provided an opportunity to ask questions and all were answered. The patient agreed with the plan and demonstrated an understanding of the instructions.   The patient was advised to call back or seek an in-person evaluation if the symptoms worsen or if the condition fails to improve as anticipated.      Review of Systems     Objective:   Physical Exam        Assessment & Plan:

## 2023-03-23 ENCOUNTER — Telehealth: Payer: Self-pay | Admitting: Pharmacy Technician

## 2023-03-23 NOTE — Telephone Encounter (Signed)
Patient Advocate Encounter   Received notification that prior authorization for oxyCODONE-Acetaminophen 10-325MG  tablets is required.   PA submitted on 03/23/2023 [employer] Electronic PA Form Status is pending

## 2023-03-24 ENCOUNTER — Other Ambulatory Visit (HOSPITAL_COMMUNITY): Payer: Self-pay

## 2023-03-27 ENCOUNTER — Other Ambulatory Visit (HOSPITAL_COMMUNITY): Payer: Self-pay

## 2023-03-27 NOTE — Telephone Encounter (Signed)
Patient Advocate Encounter  Prior Authorization for oxyCODONE-Acetaminophen 10-325MG  tablets has been approved.    PA# [id number] Effective dates: 03/23/23 through 09/19/23

## 2023-03-28 ENCOUNTER — Other Ambulatory Visit (HOSPITAL_COMMUNITY): Payer: Self-pay

## 2023-03-29 NOTE — Telephone Encounter (Signed)
Noted  

## 2023-04-05 ENCOUNTER — Telehealth: Payer: Self-pay | Admitting: Family Medicine

## 2023-04-05 NOTE — Telephone Encounter (Signed)
I do not prescribe CPAP machines. He would need to check with his pulmonologist (or whoever he is working with).

## 2023-04-05 NOTE — Telephone Encounter (Signed)
Pt called to request Rx for new CPap Machine   LOV:  12/27/22

## 2023-04-06 NOTE — Telephone Encounter (Signed)
Pt was notified.  

## 2023-04-15 ENCOUNTER — Other Ambulatory Visit: Payer: Self-pay | Admitting: Family Medicine

## 2023-04-21 ENCOUNTER — Ambulatory Visit (INDEPENDENT_AMBULATORY_CARE_PROVIDER_SITE_OTHER): Payer: BC Managed Care – PPO | Admitting: Pulmonary Disease

## 2023-04-21 ENCOUNTER — Encounter: Payer: Self-pay | Admitting: Pulmonary Disease

## 2023-04-21 VITALS — BP 122/78 | HR 76 | Resp 92 | Ht 70.0 in | Wt 350.0 lb

## 2023-04-21 DIAGNOSIS — G4733 Obstructive sleep apnea (adult) (pediatric): Secondary | ICD-10-CM

## 2023-04-21 NOTE — Patient Instructions (Addendum)
DME referral to evaluate and service machine -You have been using it regularly from the download and the machine seems to be working well-at least treating the events -If the DME feels the machine is not working well then they can contact us to put in an order for new machine  DME to change pressures from 6-13 to 6-15  -Make sure you call us if the pressure change is not tolerated  Tentative follow-up in 6 months

## 2023-04-21 NOTE — Progress Notes (Unsigned)
Gregory Cortez    [id number]    25-Aug-1956  Primary Care Physician:Fry, Tera Mater, MD  Referring Physician: Nelwyn Salisbury, MD [address] [place],  [place] [address]  Chief complaint:   Obstructive sleep apnea in for follow-up  HPI:  Patient with history of obstructive sleep apnea diagnosed in 2009, titrated to a pressure of 7 Number of apneas was about 50 to per hour  Has been using CPAP since then  Lately has noted a humming sound from his machine, feels it is not working as optimally more tired during the day nowadays  Usually goes to bed between 9 and 11 PM Takes him about 10 to 15 minutes to fall asleep 2-3 awakenings Final wake up time about 6 AM  Wake up feeling rested  Does have dryness of his mouth in the morning Denies morning headaches Lately more sleepy during the day   His weight has been improving is down overall about 80 pounds  He has hypertension, diabetes, hypercholesterolemia  He continues to use CPAP nightly He feels the machine may not be working as well because he has a humming sound.   Outpatient Encounter Medications as of 04/21/2023  Medication Sig   ALPRAZolam (XANAX) 1 MG tablet TAKE 1 TABLET BY MOUTH THREE TIMES DAILY AS NEEDED FOR ANXIETY   Cholecalciferol (VITAMIN D-3 PO) Take by mouth daily. With Vitamin K   diphenhydramine-acetaminophen (TYLENOL PM) 25-500 MG TABS Take 2 tablets by mouth at bedtime.   folic acid (FOLVITE) 1 MG tablet Take 1 tablet (1 mg total) by mouth daily.   metFORMIN (GLUCOPHAGE) 1000 MG tablet TAKE 1 TABLET BY MOUTH TWICE DAILY WITH A MEAL   methocarbamol (ROBAXIN) 500 MG tablet Take 1 tablet (500 mg total) by mouth every 8 (eight) hours as needed for muscle spasms.   Multiple Vitamin (MULTIVITAMIN WITH MINERALS) TABS tablet Take 1 tablet by mouth once a week.    omeprazole (PRILOSEC) 40 MG capsule Take 1 capsule (40 mg total) by mouth daily.   [START ON 05/07/2023] oxyCODONE-acetaminophen  (PERCOCET) 10-325 MG tablet Take 1 tablet by mouth every 6 (six) hours as needed.   Semaglutide, 1 MG/DOSE, (OZEMPIC, 1 MG/DOSE,) 4 MG/3ML SOPN Inject 1 mg into the skin once a week.   SYRINGE-NEEDLE, DISP, 3 ML (B-D SYRINGE/NEEDLE 3CC/22GX1.5) 22G X 1-1/2" 3 ML MISC Inject 0.5 mL into the muscle each week.   testosterone cypionate (DEPOTESTOSTERONE CYPIONATE) 200 MG/ML injection INJECT  0.5 ML (CC) INTRAMUSCULARLY TWICE A WEEK   XARELTO 20 MG TABS tablet Take 1 tablet by mouth once daily   [DISCONTINUED] methylPREDNISolone (MEDROL DOSEPAK) 4 MG TBPK tablet As directed   [DISCONTINUED] azithromycin (ZITHROMAX Z-PAK) 250 MG tablet As directed   No facility-administered encounter medications on file as of 04/21/2023.    Allergies as of 04/21/2023 - Review Complete 04/21/2023  Allergen Reaction Noted   Lisinopril Cough 07/07/2021    Past Medical History:  Diagnosis Date   Chest pain    Diabetes mellitus type 2 in obese 11/29/2016   ED (erectile dysfunction)    GERD (gastroesophageal reflux disease)    [facility]   Hx of colonoscopy    Low back pain    dr Channing Mutters, dr Farris Has, dr Ethelene Hal, herniated disc L4-5   OSA (obstructive sleep apnea)    dr Shelle Iron   PE (pulmonary embolism)    bilateral sep 2011 and again bilateral May 2012   Primary hypercoagulable state (HCC)  no etiology found per Dr. Shirline Frees    Pulmonary embolism [facility]) 02/22/2010   CT angio Dx   Testosterone deficiency    dr Patsi Sears, shots every 2 weeks   Thrombosis of arm    left arm 08/2010    Past Surgical History:  Procedure Laterality Date   APPENDECTOMY     cardiac stress test x 2      COLONOSCOPY  08-19-09   per Dr. Evette Cristal, clear, repeat in 10 yrs    COLONOSCOPY WITH PROPOFOL N/A 10/15/2019   Procedure: COLONOSCOPY WITH PROPOFOL;  Surgeon: Graylin Shiver, MD;  Location: [facility] END[facility]COPY;  Service: Endoscopy;  Laterality: N/A;   esi     L4-5 dr Channing Mutters 03/2010   ESOPHAGOGASTRODUODENOSCOPY     x2 - normal except reflux   KNEE  SURGERY     both   LAPAROSCOPIC APPENDECTOMY  09/13/2012   Procedure: APPENDECTOMY LAPAROSCOPIC;  Surgeon: Almond Lint, MD;  Location: [facility];  Service: General;  Laterality: N/A;   ROTATOR CUFF REPAIR  08-20-10   left dr Wyline Mood complicated by PE   SHOULDER SURGERY     right and left    Family History  Problem Relation Age of Onset   Hypertension Other    Cancer Other        lung   Coronary artery disease Neg Hx     Social History   Socioeconomic History   Marital status: Married    Spouse name: Not on file   Number of children: 3   Years of education: Not on file   Highest education level: Not on file  Occupational History   Occupation: Desk job-petroleum dispatcher    Employer: [employer]  Tobacco Use   Smoking status: Never   Smokeless tobacco: Never   Tobacco comments:    never used product  Vaping Use   Vaping Use: Never used  Substance and Sexual Activity   Alcohol use: No    Alcohol/week: 0.0 standard drinks of alcohol   Drug use: No   Sexual activity: Not on file  Other Topics Concern   Not on file  Social History Narrative   Not on file   Social Determinants of Health   Financial Resource Strain: Not on file  Food Insecurity: Not on file  Transportation Needs: Not on file  Physical Activity: Not on file  Stress: Not on file  Social Connections: Not on file  Intimate Partner Violence: Not on file    Review of Systems  Respiratory:  Positive for apnea.   Psychiatric/Behavioral:  Positive for sleep disturbance.     Vitals:   04/21/23 0906  BP: 122/78  Pulse: 76  Resp: (!) 92     Physical Exam Constitutional:      Appearance: He is obese.  HENT:     Head: Normocephalic.     Nose: Nose normal.     Mouth/Throat:     Mouth: Mucous membranes are moist.  Eyes:     General: No scleral icterus. Cardiovascular:     Rate and Rhythm: Normal rate and regular rhythm.     Heart sounds: No murmur heard.    No friction rub.  Pulmonary:      Effort: No respiratory distress.     Breath sounds: No stridor. No wheezing or rhonchi.  Musculoskeletal:     Cervical back: No rigidity or tenderness.  Neurological:     Mental Status: He is alert.  Psychiatric:  Mood and Affect: Mood normal.       04/21/2023    9:00 AM 01/28/2019    2:00 PM  Results of the Epworth flowsheet  Sitting and reading 0 3  Watching TV 1 3  Sitting, inactive in a public place (e.g. a theatre or a meeting) 0 2  As a passenger in a car for an hour without a break 0 0  Lying down to rest in the afternoon when circumstances permit 3 3  Sitting and talking to someone 0 0  Sitting quietly after a lunch without alcohol 0 2  In a car, while stopped for a few minutes in traffic 0 0  Total score 4 13     Data Reviewed: CPAP compliance reviewed showing excellent compliance over the last 90 days of 100% Average use of 7 hours 31 minutes AutoSet of 6-13 Residual AHI of 3.8 Occasional mask leaks 95 percentile pressure of 12.9  Assessment:  Severe obstructive sleep apnea  Machine making some humming noise at present Patient feels he is more sleepy during the day nowadays  Class III obesity -He is continuing to lose weight  His current machine is about 65 years old and will be due a new machine about August  Plan/Recommendations: Contact DME  Encouraged to take his machine in to have the machine evaluated to make sure it is working well  Will make pressure changes, change pressure from 6-13 to 6-15  Encouraged to continue weight loss efforts  When due for the machine, we will place an order for auto CPAP 6-15  Tentative follow-up in about 6 months   Virl Diamond MD [facility] 04/21/2023, 9:13 AM  CC: Nelwyn Salisbury, MD

## 2023-05-23 DIAGNOSIS — M25512 Pain in left shoulder: Secondary | ICD-10-CM | POA: Diagnosis not present

## 2023-05-23 DIAGNOSIS — M13862 Other specified arthritis, left knee: Secondary | ICD-10-CM | POA: Diagnosis not present

## 2023-05-23 DIAGNOSIS — M13861 Other specified arthritis, right knee: Secondary | ICD-10-CM | POA: Diagnosis not present

## 2023-05-27 ENCOUNTER — Emergency Department (HOSPITAL_BASED_OUTPATIENT_CLINIC_OR_DEPARTMENT_OTHER): Payer: BC Managed Care – PPO

## 2023-05-27 ENCOUNTER — Encounter (HOSPITAL_BASED_OUTPATIENT_CLINIC_OR_DEPARTMENT_OTHER): Payer: Self-pay | Admitting: Emergency Medicine

## 2023-05-27 ENCOUNTER — Emergency Department (HOSPITAL_BASED_OUTPATIENT_CLINIC_OR_DEPARTMENT_OTHER)
Admission: EM | Admit: 2023-05-27 | Discharge: 2023-05-27 | Disposition: A | Discharge status: Home / Self Care | Payer: BC Managed Care – PPO | Attending: Emergency Medicine | Admitting: Emergency Medicine

## 2023-05-27 ENCOUNTER — Other Ambulatory Visit: Payer: Self-pay | Admitting: Family Medicine

## 2023-05-27 DIAGNOSIS — R0602 Shortness of breath: Secondary | ICD-10-CM

## 2023-05-27 DIAGNOSIS — E119 Type 2 diabetes mellitus without complications: Secondary | ICD-10-CM | POA: Insufficient documentation

## 2023-05-27 DIAGNOSIS — Z7901 Long term (current) use of anticoagulants: Secondary | ICD-10-CM | POA: Insufficient documentation

## 2023-05-27 DIAGNOSIS — R066 Hiccough: Secondary | ICD-10-CM | POA: Diagnosis not present

## 2023-05-27 DIAGNOSIS — Z7984 Long term (current) use of oral hypoglycemic drugs: Secondary | ICD-10-CM | POA: Diagnosis not present

## 2023-05-27 DIAGNOSIS — R0789 Other chest pain: Secondary | ICD-10-CM | POA: Diagnosis not present

## 2023-05-27 DIAGNOSIS — J9811 Atelectasis: Secondary | ICD-10-CM | POA: Diagnosis not present

## 2023-05-27 LAB — CBC
HCT: 46.2 % (ref 39.0–52.0)
Hemoglobin: 14.1 g/dL (ref 13.0–17.0)
MCH: 22.7 pg — ABNORMAL LOW (ref 26.0–34.0)
MCHC: 30.5 g/dL (ref 30.0–36.0)
MCV: 74.3 fL — ABNORMAL LOW (ref 80.0–100.0)
Platelets: 393 10*3/uL (ref 150–400)
RBC: 6.22 MIL/uL — ABNORMAL HIGH (ref 4.22–5.81)
RDW: 20.7 % — ABNORMAL HIGH (ref 11.5–15.5)
WBC: 10.6 10*3/uL — ABNORMAL HIGH (ref 4.0–10.5)
nRBC: 0.2 % (ref 0.0–0.2)

## 2023-05-27 LAB — BASIC METABOLIC PANEL
Anion gap: 10 (ref 5–15)
BUN: 21 mg/dL (ref 8–23)
CO2: 24 mmol/L (ref 22–32)
Calcium: 9.6 mg/dL (ref 8.9–10.3)
Chloride: 103 mmol/L (ref 98–111)
Creatinine, Ser: 0.84 mg/dL (ref 0.61–1.24)
GFR, Estimated: >60 mL/min (ref >60)
Glucose, Bld: 190 mg/dL — ABNORMAL HIGH (ref 70–99)
Potassium: 4.4 mmol/L (ref 3.5–5.1)
Sodium: 137 mmol/L (ref 135–145)

## 2023-05-27 LAB — TROPONIN I (HIGH SENSITIVITY)
Troponin I (High Sensitivity): 5 ng/L (ref <18)
Troponin I (High Sensitivity): 6 ng/L (ref <18)

## 2023-05-27 MED ORDER — METOCLOPRAMIDE HCL 5 MG/ML IJ SOLN
10.0000 mg | Freq: Once | INTRAMUSCULAR | Status: DC
  Filled 2023-05-27: qty 2

## 2023-05-27 MED ORDER — IOHEXOL 350 MG/ML SOLN
100.0000 mL | Freq: Once | INTRAVENOUS | Status: AC | PRN
  Administered 2023-05-27: 75 mL via INTRAVENOUS

## 2023-05-27 MED ORDER — METOCLOPRAMIDE HCL 5 MG/ML IJ SOLN
10.0000 mg | Freq: Once | INTRAMUSCULAR | Status: AC
  Administered 2023-05-27: 10 mg via INTRAVENOUS

## 2023-05-27 MED ORDER — METOCLOPRAMIDE HCL 10 MG PO TABS: 10.0000 mg | ORAL_TABLET | Freq: Three times a day (TID) | ORAL | 0 refills | Status: DC | PRN

## 2023-05-27 NOTE — ED Notes (Signed)
Patient transported to CT 

## 2023-05-27 NOTE — Discharge Instructions (Addendum)
Thank you for allowing me to be part of your care today.  Your workup today is overall reassuring and did not show evidence of heart attack, damage to the heart, or PE.  Your blood sugar was elevated at 190, and I encourage you to continue monitoring your blood sugar at home.  I am sending you home on metoclopramide to help with hiccups.  You may take this 3 times a day for the next 10 days if you continue to have persistent hiccups.  Return to the ED if you develop sudden worsening of your symptoms or if you have any new concerns.

## 2023-05-27 NOTE — ED Triage Notes (Addendum)
SOB x a few days with "bad hiccups". Concerned for PE, hx of same. Also endorses chest heaviness

## 2023-05-27 NOTE — ED Provider Notes (Signed)
[place] EMERGENCY DEPARTMENT AT [facility] Provider Note   CSN: [id number] Arrival date & time: 05/27/23  2015     History  Chief Complaint  Patient presents with   Shortness of Breath    Gregory Cortez is a 67 y.o. male with past medical history significant for obesity, OSA, GERD, PE, primary hypercoagulable state, chronic anticoagulation with Xarelto, diabetes presents to the ED complaining of shortness of breath for the past few days and persistent hiccups for the past 48 hours.  Patient is concerned that he has a PE as he had similar presenting symptoms with his prior PE.  Patient has not missed any doses of Xarelto.  He also endorses chest heaviness.  He states he began feeling poorly after he received 2 steroid injections at his orthopedic provider.  He states his blood sugars have been in the 400s, despite being a well controlled diabetic the past couple of years.  Patient states that his hiccups are improved when he is sitting completely upright, but return whenever he reclines slightly.  Denies known fever, chest tightness, cough, chest pain, leg swelling, palpitations.       Home Medications Prior to Admission medications   Medication Sig Start Date End Date Taking? Authorizing Provider  ALPRAZolam Prudy Feeler) 1 MG tablet TAKE 1 TABLET BY MOUTH THREE TIMES DAILY AS NEEDED FOR ANXIETY 03/01/23   Nelwyn Salisbury, MD  Cholecalciferol (VITAMIN D-3 PO) Take by mouth daily. With Vitamin K    [provider]  diphenhydramine-acetaminophen (TYLENOL PM) 25-500 MG TABS Take 2 tablets by mouth at bedtime.    [provider]  folic acid (FOLVITE) 1 MG tablet Take 1 tablet (1 mg total) by mouth daily. 08/24/22   Nelwyn Salisbury, MD  metFORMIN (GLUCOPHAGE) 1000 MG tablet TAKE 1 TABLET BY MOUTH TWICE DAILY WITH A MEAL 04/17/23   Nelwyn Salisbury, MD  methocarbamol (ROBAXIN) 500 MG tablet Take 1 tablet (500 mg total) by mouth every 8 (eight) hours as needed for muscle spasms.  08/26/20   Nelwyn Salisbury, MD  Multiple Vitamin (MULTIVITAMIN WITH MINERALS) TABS tablet Take 1 tablet by mouth once a week.     [provider]  omeprazole (PRILOSEC) 40 MG capsule Take 1 capsule (40 mg total) by mouth daily. 12/24/21   Nelwyn Salisbury, MD  oxyCODONE-acetaminophen (PERCOCET) 10-325 MG tablet Take 1 tablet by mouth every 6 (six) hours as needed. 05/07/23 06/06/23  Nelwyn Salisbury, MD  Semaglutide, 1 MG/DOSE, (OZEMPIC, 1 MG/DOSE,) 4 MG/3ML SOPN Inject 1 mg into the skin once a week. 04/19/22   Nelwyn Salisbury, MD  SYRINGE-NEEDLE, DISP, 3 ML (B-D SYRINGE/NEEDLE 3CC/22GX1.5) 22G X 1-1/2" 3 ML MISC Inject 0.5 mL into the muscle each week. 07/14/20   Nelwyn Salisbury, MD  testosterone cypionate (DEPOTESTOSTERONE CYPIONATE) 200 MG/ML injection INJECT  0.5 ML (CC) INTRAMUSCULARLY TWICE A WEEK 01/02/23   Nelwyn Salisbury, MD  XARELTO 20 MG TABS tablet Take 1 tablet by mouth once daily 04/17/23   Nelwyn Salisbury, MD      Allergies    Lisinopril    Review of Systems   Review of Systems  Constitutional:  Negative for fever.  Respiratory:  Positive for shortness of breath. Negative for cough and chest tightness.   Cardiovascular:  Negative for chest pain, palpitations and leg swelling.       Chest heaviness    Physical Exam Updated Vital Signs BP (!) 113/54 (BP Location: Right Arm)  Pulse 85   Temp 97.7 F (36.5 C) (Oral)   Resp 18   Ht 5\' 10"  (1.778 m)   Wt (!) 157 kg   SpO2 94%   BMI 49.66 kg/m  Physical Exam Vitals and nursing note reviewed.  Constitutional:      General: He is not in acute distress.    Appearance: Normal appearance. He is not ill-appearing or diaphoretic.  Cardiovascular:     Rate and Rhythm: Normal rate. Rhythm irregular.     Heart sounds: Normal heart sounds.  Pulmonary:     Effort: Tachypnea present. No respiratory distress.     Breath sounds: Normal breath sounds and air entry.     Comments: Patient speaks in full sentences, has mild tachypnea, but  does not appear to be in respiratory distress. Abdominal:     General: Abdomen is flat. There is no distension.     Palpations: Abdomen is soft.     Tenderness: There is no abdominal tenderness.  Musculoskeletal:     Right lower leg: No edema.     Left lower leg: No edema.  Skin:    General: Skin is warm and dry.     Capillary Refill: Capillary refill takes less than 2 seconds.     Coloration: Skin is not cyanotic.  Neurological:     Mental Status: He is alert. Mental status is at baseline.  Psychiatric:        Mood and Affect: Mood normal.        Behavior: Behavior normal.     ED Results / Procedures / Treatments   Labs (all labs ordered are listed, but only abnormal results are displayed) Labs Reviewed  BASIC METABOLIC PANEL - Abnormal; Notable for the following components:      Result Value   Glucose, Bld 190 (*)    All other components within normal limits  CBC - Abnormal; Notable for the following components:   WBC 10.6 (*)    RBC 6.22 (*)    MCV 74.3 (*)    MCH 22.7 (*)    RDW 20.7 (*)    All other components within normal limits  TROPONIN I (HIGH SENSITIVITY)  TROPONIN I (HIGH SENSITIVITY)    EKG None  Radiology CT Angio Chest PE W and/or Wo Contrast  Result Date: 05/27/2023 CLINICAL DATA:  Pulmonary embolism suspected, high probability. Chest heaviness, hiccups. EXAM: CT ANGIOGRAPHY CHEST WITH CONTRAST TECHNIQUE: Multidetector CT imaging of the chest was performed using the standard protocol during bolus administration of intravenous contrast. Multiplanar CT image reconstructions and MIPs were obtained to evaluate the vascular anatomy. RADIATION DOSE REDUCTION: This exam was performed according to the departmental dose-optimization program which includes automated exposure control, adjustment of the mA and/or kV according to patient size and/or use of iterative reconstruction technique. CONTRAST:  75mL OMNIPAQUE IOHEXOL 350 MG/ML SOLN COMPARISON:  10/03/2021.  FINDINGS: Cardiovascular: The heart is enlarged and there is no pericardial effusion. Small coronary artery calcification is noted. The aorta and pulmonary trunk are normal in caliber. No large central pulmonary embolus. Evaluation of the segmental and subsegmental arteries is limited due to mixing artifact. Mediastinum/Nodes: No enlarged mediastinal, hilar, or axillary lymph nodes. Thyroid gland, trachea, and esophagus demonstrate no significant findings. There is a small hiatal hernia. Lungs/Pleura: Lungs are clear. No pleural effusion or pneumothorax. Upper Abdomen: No acute abnormality. Musculoskeletal: Degenerative changes are present in the thoracic spine. No acute or suspicious osseous abnormality. Review of the MIP images confirms the above findings.  IMPRESSION: 1. No large central pulmonary embolus. Evaluation of the segmental and subsegmental arteries is limited due to suboptimal opacification. Consider short-term follow-up CT or V/Q scan. 2. No acute process in the chest. 3. Small hiatal hernia. 4. Coronary artery calcification. Electronically Signed   By: Thornell Sartorius M.D.   On: 05/27/2023 22:32   DG Chest Port 1 View  Result Date: 05/27/2023 CLINICAL DATA:  Shortness of breath and hiccups. EXAM: PORTABLE CHEST 1 VIEW COMPARISON:  October 03, 2021 FINDINGS: The heart size and mediastinal contours are within normal limits. Mild, diffuse, chronic appearing increased lung markings are seen. Mild linear atelectasis is noted within the left lung base. There is no evidence of an acute infiltrate, pleural effusion or pneumothorax. Multilevel degenerative changes seen throughout the thoracic spine. IMPRESSION: Mild left basilar linear atelectasis. Electronically Signed   By: Aram Candela M.D.   On: 05/27/2023 21:08    Procedures Procedures    Medications Ordered in ED Medications  metoCLOPramide (REGLAN) injection 10 mg (has no administration in time range)  iohexol (OMNIPAQUE) 350 MG/ML  injection 100 mL (75 mLs Intravenous Contrast Given 05/27/23 2200)    ED Course/ Medical Decision Making/ A&P                             Medical Decision Making Amount and/or Complexity of Data Reviewed Labs: ordered. Radiology: ordered.  Risk Prescription drug management.   This patient presents to the ED with chief complaint(s) of shortness of breath, hiccups with pertinent past medical history of obesity, OSA, GERD, PE, primary hypercoagulable state, diabetes.  The complaint involves an extensive differential diagnosis and also carries with it a high risk of complications and morbidity.    The differential diagnosis includes ACS, PE, pneumothorax, acute CHF, pneumonia, pleural effusion, reflux  The initial plan is to obtain baseline labs including troponin, chest x-ray, and CTA PE study  Additional history obtained: Records reviewed  patient was seen last month by pulmonology for his OSA, patient has been using his CPAP nightly.  Initial Assessment:   Exam significant for dyspneic patient who is not in acute distress.  Patient does appear to have hiccups while attempting to speak in full sentences.  Lungs are clear to auscultation bilaterally with adequate tidal volume.  Heart rate is normal in the 80s with a regular rhythm.  SpO2 is 92 to 95%, which patient reports is "good for him".  Independent ECG/labs interpretation:  The following labs were independently interpreted:  ECG demonstrates sinus rhythm with bifascicular block.    Independent visualization and interpretation of imaging: I independently visualized the following imaging with scope of interpretation limited to determining acute life threatening conditions related to emergency care: Chest x-ray, which revealed mild bibasilar atelectasis.  CT angio PE study was ordered and did not show evidence of PE.  Evaluation of segmental and subsegmental arteries was limited.  Patient does not have evidence of large central  PE.  Treatment and Reassessment: Upon reassessment following imaging, patient has had resolution in his hiccups.  Considered IV Reglan for hiccups, which patient would still like to have despite no active hiccups.  Patient given 10 mg Reglan. Will send him home on this as well should he need this for intractable hiccups.  Patient is speaking in full sentences and does not appear to be in respiratory distress.  Patient SpO2 is stable.  Tachypnea has resolved.  Disposition:   Will send patient home  on short course of Reglan to use for hiccups.  Advised patient to continue taking his medications as prescribed.  Recommended patient follow-up with primary care provider if he continues to have hiccups and increased shortness of breath.  Patient's workup today is reassuring and does not show evidence of large central PE.  Patient continues to be anticoagulated with Xarelto and has not missed any doses.  No evidence of ACS at this time.  Mild leukocytosis likely secondary to recent prednisone use.  Advised patient to continue monitoring his blood sugar at home for improvement after steroids.  Patient states that he is ready for discharge home.  The patient has been appropriately medically screened and/or stabilized in the ED. I have low suspicion for any other emergent medical condition which would require further screening, evaluation or treatment in the ED or require inpatient management. At time of discharge the patient is hemodynamically stable and in no acute distress. I have discussed work-up results and diagnosis with patient and answered all questions. Patient is agreeable with discharge plan. We discussed strict return precautions for returning to the emergency department and they verbalized understanding.           Final Clinical Impression(s) / ED Diagnoses Final diagnoses:  Shortness of breath  Hiccups    Rx / DC Orders ED Discharge Orders     None         Lenard Simmer,  PA-C 05/27/23 2352    Benjiman Core, MD 05/28/23 0020

## 2023-05-27 NOTE — ED Notes (Signed)
RN reviewed discharge instructions with pt. Pt verbalized understanding and had no further questions. VSS upon discharge.  

## 2023-05-28 ENCOUNTER — Other Ambulatory Visit: Payer: Self-pay | Admitting: Family Medicine

## 2023-05-29 NOTE — Telephone Encounter (Signed)
Pt LOV was on 12/27/21 Last refill was done on 01/02/23 Last Testosterone labs was on 08/15/22 Please advise

## 2023-05-30 ENCOUNTER — Telehealth: Payer: Self-pay | Admitting: Pulmonary Disease

## 2023-06-02 ENCOUNTER — Encounter: Payer: Self-pay | Admitting: Family Medicine

## 2023-06-02 ENCOUNTER — Ambulatory Visit (INDEPENDENT_AMBULATORY_CARE_PROVIDER_SITE_OTHER): Payer: BC Managed Care – PPO | Admitting: Family Medicine

## 2023-06-02 VITALS — BP 136/74 | HR 80 | Temp 98.6°F | Wt 343.0 lb

## 2023-06-02 DIAGNOSIS — E119 Type 2 diabetes mellitus without complications: Secondary | ICD-10-CM | POA: Diagnosis not present

## 2023-06-02 DIAGNOSIS — Z7984 Long term (current) use of oral hypoglycemic drugs: Secondary | ICD-10-CM

## 2023-06-02 DIAGNOSIS — Z7985 Long-term (current) use of injectable non-insulin antidiabetic drugs: Secondary | ICD-10-CM | POA: Diagnosis not present

## 2023-06-02 DIAGNOSIS — R066 Hiccough: Secondary | ICD-10-CM

## 2023-06-02 LAB — POCT GLYCOSYLATED HEMOGLOBIN (HGB A1C): Hemoglobin A1C: 5.8 % — AB (ref 4.0–5.6)

## 2023-06-02 MED ORDER — SEMAGLUTIDE (2 MG/DOSE) 8 MG/3ML ~~LOC~~ SOPN: 2.0000 mg | PEN_INJECTOR | SUBCUTANEOUS | 5 refills | Status: DC

## 2023-06-02 NOTE — Telephone Encounter (Signed)
Change CPAP pressures to 5-12

## 2023-06-02 NOTE — Telephone Encounter (Signed)
Spoke with patient he states CPAP pressure is too high. He wants to know if it can be decreased some. Dr. Val Eagle please advise?

## 2023-06-02 NOTE — Progress Notes (Signed)
   Subjective:    Patient ID: Gregory Cortez, male    DOB: 07/08/1956, 67 y.o.   MRN: [id number]  HPI Here to follow up on an ED visit on 05-27-23 for intractable hiccups. These started 24 hours before then, and he was getting quite fatigued and SOB from them. At the ED a chest CTA ruled out any PE 's. His labs were normal except for a glucose of 190. He was given a dose of IV Reglan and sent home. He was also given a RX for Reglan, but he never picked this up from the pharmacy. That night the hiccups improved, and but the next morning they were gone. He has not had any hiccups since then. 2 days prior to getting the hiccups he received 2 cortisone shots at his orthopedics clinic, one in the left shoulder and one in the right knee. This drove his glucose up to 418 the next morning. It is not clear if this had anything to do with the hiccups or not. His glucose this am was back down to 108.   Review of Systems  Constitutional: Negative.   Respiratory: Negative.    Cardiovascular: Negative.        Objective:   Physical Exam Constitutional:      General: He is not in acute distress.    Appearance: Normal appearance.  Cardiovascular:     Rate and Rhythm: Normal rate and regular rhythm.     Pulses: Normal pulses.     Heart sounds: Normal heart sounds.  Pulmonary:     Effort: Pulmonary effort is normal.     Breath sounds: Normal breath sounds.  Neurological:     Mental Status: He is alert.           Assessment & Plan:  He has recovered from a bout of intractable hiccups. If they should return, I advised him to get the Reglan pills at his pharmacy. He plans to never get more than one steroid injection at a time in the future. We spent a total of (32   ) minutes reviewing records and discussing these issues.  [name], MD

## 2023-06-02 NOTE — Addendum Note (Signed)
Addended by: Carola Rhine on: 06/02/2023 03:21 PM   Modules accepted: Orders

## 2023-06-24 ENCOUNTER — Other Ambulatory Visit: Payer: Self-pay | Admitting: Family Medicine

## 2023-06-26 NOTE — Telephone Encounter (Signed)
Pt LOV was on 06/02/23 Last refill was done on 04/17/23 Please advise

## 2023-07-12 ENCOUNTER — Other Ambulatory Visit: Payer: Self-pay | Admitting: Family Medicine

## 2023-07-21 ENCOUNTER — Telehealth: Payer: Self-pay | Admitting: Family Medicine

## 2023-07-21 NOTE — Telephone Encounter (Signed)
Prescription Request  07/21/2023  LOV: 06/02/2023  What is the name of the medication or equipment? oxyCODONE-acetaminophen (PERCOCET) 10-325 MG tablet   Have you contacted your pharmacy to request a refill? No   Which pharmacy would you like this sent to?  [facility] [address] [place], [place] - [address] [place] [address] Phone: [phone] Fax: [phone]    Patient notified that their request is being sent to the clinical staff for review and that they should receive a response within 2 business days.   Please advise at Mobile [phone] (mobile)

## 2023-07-27 DIAGNOSIS — S46911D Strain of unspecified muscle, fascia and tendon at shoulder and upper arm level, right arm, subsequent encounter: Secondary | ICD-10-CM | POA: Diagnosis not present

## 2023-07-27 DIAGNOSIS — M25512 Pain in left shoulder: Secondary | ICD-10-CM | POA: Diagnosis not present

## 2023-07-31 ENCOUNTER — Telehealth: Payer: Self-pay | Admitting: Pulmonary Disease

## 2023-07-31 NOTE — Telephone Encounter (Signed)
Fax received from Dr. Ramond Marrow with [facility] to perform a  Left Reverse Total Shoulder Replacement  on patient.  Patient needs surgery clearance. Surgery is Pending. Patient was seen on 04/21/23. Office protocol is a risk assessment can be sent to surgeon if patient has been seen in 60 days or less.   Sending to Dr. Wynona Neat for risk assessment or recommendations if patient needs to be seen in office prior to surgical procedure.

## 2023-08-02 ENCOUNTER — Other Ambulatory Visit: Payer: Self-pay | Admitting: Family Medicine

## 2023-08-04 NOTE — Telephone Encounter (Signed)
Clearance has been sent back to [facility]. Surgeons office aware patient is on cpap machine. Closing encounter nfn

## 2023-08-04 NOTE — Telephone Encounter (Signed)
No pulmonary preclusion to surgery based on last office visit  Continue to use CPAP on a nightly basis  You should make sure that his physician is aware that he has sleep apnea and is compliant with treatment

## 2023-08-11 ENCOUNTER — Other Ambulatory Visit: Payer: Self-pay | Admitting: Orthopaedic Surgery

## 2023-08-11 DIAGNOSIS — Z01818 Encounter for other preprocedural examination: Secondary | ICD-10-CM

## 2023-08-14 ENCOUNTER — Ambulatory Visit
Admission: RE | Admit: 2023-08-14 | Discharge: 2023-08-14 | Disposition: A | Discharge status: Home / Self Care | Payer: BC Managed Care – PPO | Source: Ambulatory Visit | Attending: Orthopaedic Surgery | Admitting: Orthopaedic Surgery

## 2023-08-14 DIAGNOSIS — M19012 Primary osteoarthritis, left shoulder: Secondary | ICD-10-CM | POA: Diagnosis not present

## 2023-08-14 DIAGNOSIS — M25512 Pain in left shoulder: Secondary | ICD-10-CM | POA: Diagnosis not present

## 2023-08-14 DIAGNOSIS — Z01818 Encounter for other preprocedural examination: Secondary | ICD-10-CM

## 2023-08-14 DIAGNOSIS — S46012A Strain of muscle(s) and tendon(s) of the rotator cuff of left shoulder, initial encounter: Secondary | ICD-10-CM | POA: Diagnosis not present

## 2023-08-15 DIAGNOSIS — H532 Diplopia: Secondary | ICD-10-CM | POA: Diagnosis not present

## 2023-08-17 NOTE — Telephone Encounter (Signed)
Checking on progress of this refill,. Requests a call

## 2023-08-18 NOTE — Telephone Encounter (Signed)
Pt has a PMV /UDS on 08/22/23

## 2023-08-22 ENCOUNTER — Ambulatory Visit (INDEPENDENT_AMBULATORY_CARE_PROVIDER_SITE_OTHER): Payer: BC Managed Care – PPO | Admitting: Family Medicine

## 2023-08-22 ENCOUNTER — Encounter: Payer: Self-pay | Admitting: Family Medicine

## 2023-08-22 ENCOUNTER — Other Ambulatory Visit: Payer: Self-pay

## 2023-08-22 VITALS — BP 136/74 | HR 79 | Temp 98.0°F | Wt 349.0 lb

## 2023-08-22 DIAGNOSIS — F1190 Opioid use, unspecified, uncomplicated: Secondary | ICD-10-CM

## 2023-08-22 DIAGNOSIS — H532 Diplopia: Secondary | ICD-10-CM

## 2023-08-22 DIAGNOSIS — M25561 Pain in right knee: Secondary | ICD-10-CM | POA: Diagnosis not present

## 2023-08-22 DIAGNOSIS — M25562 Pain in left knee: Secondary | ICD-10-CM

## 2023-08-22 DIAGNOSIS — G8929 Other chronic pain: Secondary | ICD-10-CM

## 2023-08-22 MED ORDER — DIAZEPAM 10 MG PO TABS: ORAL_TABLET | ORAL | 0 refills | Status: AC

## 2023-08-22 MED ORDER — OXYCODONE-ACETAMINOPHEN 10-325 MG PO TABS
1.0000 | ORAL_TABLET | Freq: Four times a day (QID) | ORAL | 0 refills | Status: DC | PRN
Start: 2023-08-22 — End: 2024-02-12

## 2023-08-22 MED ORDER — OXYCODONE-ACETAMINOPHEN 10-325 MG PO TABS: 1.0000 | ORAL_TABLET | Freq: Four times a day (QID) | ORAL | 0 refills | Status: DC | PRN

## 2023-08-22 MED ORDER — OMEPRAZOLE 40 MG PO CPDR: 40.0000 mg | DELAYED_RELEASE_CAPSULE | Freq: Every day | ORAL | 3 refills | Status: DC

## 2023-08-22 NOTE — Progress Notes (Signed)
   Subjective:    Patient ID: Gregory Cortez, male    DOB: 01-01-1956, 67 y.o.   MRN: [id number]  HPI Here for pain management, but he also complains of the sudden onset of double vision one week ago. No hx of head trauma. His vision is not blurred. If he concentrates on looking at something the double vision goes away, but it returns as soon as he relaxes. He has had a mild dull headache. He has also felt dizzy with some equilibrium trouble. No room spinning or nausea. His Bp has been well controlled, as has his diabetes. The A1c on 06-02-23 was 5.8%, and his glucose this morning was 112. Several days ago he went to his optometrist, and they complete vision testing. This all came out fine.    Review of Systems  Constitutional: Negative.   Eyes:  Positive for visual disturbance.  Respiratory: Negative.    Cardiovascular: Negative.   Musculoskeletal:  Positive for arthralgias and back pain.  Neurological:  Positive for dizziness and headaches. Negative for tremors, seizures, syncope, facial asymmetry, speech difficulty, weakness and numbness.       Objective:   Physical Exam Constitutional:      Appearance: Normal appearance.  Eyes:     Extraocular Movements: Extraocular movements intact.     Conjunctiva/sclera: Conjunctivae normal.     Pupils: Pupils are equal, round, and reactive to light.  Cardiovascular:     Rate and Rhythm: Normal rate and regular rhythm.     Pulses: Normal pulses.     Heart sounds: Normal heart sounds.  Pulmonary:     Effort: Pulmonary effort is normal.     Breath sounds: Normal breath sounds.  Lymphadenopathy:     Cervical: No cervical adenopathy.  Neurological:     General: No focal deficit present.     Mental Status: He is alert and oriented to person, place, and time.     Motor: No weakness.     Coordination: Coordination normal.     Gait: Gait normal.           Assessment & Plan:  Pain management. Indication for chronic opioid: knee  pain Medication and dose: Percocet 10-325 # pills per month: 120 Last UDS date: 08-22-23 Opioid Treatment Agreement signed (Y/N): 01-28-20 Opioid Treatment Agreement last reviewed with patient:  08-22-23 [facility] reviewed this encounter (include red flags): Yes Meds were refilled. UDS was obtained.  He also has double vision with a mild headache. We will set up a brain MRI ASAP to evaluate this.  [name], MD

## 2023-08-22 NOTE — Addendum Note (Signed)
Addended by: Carola Rhine on: 08/22/2023 04:25 PM   Modules accepted: Orders

## 2023-08-23 LAB — DRUG MONITOR, PANEL 1, W/CONF, URINE
Amphetamines: NEGATIVE ng/mL (ref <500)
Barbiturates: NEGATIVE ng/mL (ref <300)
Benzodiazepines: NEGATIVE ng/mL (ref <100)
Cocaine Metabolite: NEGATIVE ng/mL (ref <150)
Creatinine: 143.6 mg/dL (ref >=20.0)
Marijuana Metabolite: NEGATIVE ng/mL (ref <20)
Methadone Metabolite: NEGATIVE ng/mL (ref <100)
Opiates: NEGATIVE ng/mL (ref <100)
Oxidant: NEGATIVE ug/mL (ref <200)
Oxycodone: NEGATIVE ng/mL (ref <100)
Phencyclidine: NEGATIVE ng/mL (ref <25)
pH: 4.7 (ref 4.5–9.0)

## 2023-08-23 LAB — DM TEMPLATE

## 2023-08-24 ENCOUNTER — Encounter: Payer: Self-pay | Admitting: Family Medicine

## 2023-08-26 ENCOUNTER — Other Ambulatory Visit: Payer: BC Managed Care – PPO

## 2023-09-13 ENCOUNTER — Telehealth: Payer: Self-pay | Admitting: Pulmonary Disease

## 2023-09-13 NOTE — Telephone Encounter (Signed)
PT needs a new CPAP. His is getting old. States his insurance will cover. Pls call @ [phone]  Uses [facility].

## 2023-09-15 NOTE — Telephone Encounter (Signed)
Patient informed that he will need ov to accompany new order for CPAP. He states he will call office when he gets home to make appt.

## 2023-09-18 ENCOUNTER — Other Ambulatory Visit: Payer: BC Managed Care – PPO

## 2023-09-20 ENCOUNTER — Ambulatory Visit (INDEPENDENT_AMBULATORY_CARE_PROVIDER_SITE_OTHER): Payer: BC Managed Care – PPO | Admitting: Family Medicine

## 2023-09-20 ENCOUNTER — Encounter: Payer: Self-pay | Admitting: Family Medicine

## 2023-09-20 ENCOUNTER — Telehealth: Payer: Self-pay | Admitting: Pulmonary Disease

## 2023-09-20 VITALS — BP 132/80 | HR 73 | Temp 98.4°F | Wt 351.0 lb

## 2023-09-20 DIAGNOSIS — J40 Bronchitis, not specified as acute or chronic: Secondary | ICD-10-CM

## 2023-09-20 DIAGNOSIS — G4733 Obstructive sleep apnea (adult) (pediatric): Secondary | ICD-10-CM

## 2023-09-20 DIAGNOSIS — R059 Cough, unspecified: Secondary | ICD-10-CM

## 2023-09-20 LAB — POC COVID19 BINAXNOW: SARS Coronavirus 2 Ag: NEGATIVE

## 2023-09-20 MED ORDER — AZITHROMYCIN 250 MG PO TABS: ORAL_TABLET | ORAL | 0 refills | Status: DC

## 2023-09-20 NOTE — Progress Notes (Signed)
   Subjective:    Patient ID: Gregory Cortez, male    DOB: [birth year], 67 y.o.   MRN: [id number]  HPI Here for 4 days of stuffy head, PND, chest congestion, and coughing up yellow sputum. No fever or SOB. Taking Tylenol Cold and Flu.    Review of Systems  Constitutional: Negative.   HENT:  Positive for congestion, postnasal drip and sinus pressure. Negative for ear pain and sore throat.   Eyes: Negative.   Respiratory:  Positive for cough. Negative for shortness of breath and wheezing.        Objective:   Physical Exam Constitutional:      Appearance: Normal appearance.  HENT:     Right Ear: Tympanic membrane, ear canal and external ear normal.     Left Ear: Tympanic membrane, ear canal and external ear normal.     Nose: Nose normal.     Mouth/Throat:     Pharynx: Oropharynx is clear.  Eyes:     Conjunctiva/sclera: Conjunctivae normal.  Pulmonary:     Effort: Pulmonary effort is normal.     Breath sounds: Rhonchi present. No wheezing or rales.  Lymphadenopathy:     Cervical: No cervical adenopathy.  Neurological:     Mental Status: He is alert.           Assessment & Plan:  Bronchitis, treat with a Zpack.  [name], MD

## 2023-09-20 NOTE — H&P (Signed)
PREOPERATIVE H&P  Chief Complaint: left shoulder DJD, OA  HPI: Gregory Cortez is a 67 y.o. male who is scheduled for Procedure(s): REVERSE SHOULDER ARTHROPLASTY OPEN DISTAL CLAVICLE EXCISION.   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  He continues to have issues on the left side at this point.  He has tried and failed and injection, home exercise plan, physical therapy and oral medicines. He is a well-controlled diabetic.   Symptoms are rated as moderate to severe, and have been worsening.  This is significantly impairing activities of daily living.    Please see clinic note for further details on this patient's care.    He has elected for surgical management.   Past Medical History:  Diagnosis Date   Chest pain    Diabetes mellitus type 2 in obese 11/29/2016   ED (erectile dysfunction)    GERD (gastroesophageal reflux disease)    [facility]   Hx of colonoscopy    Low back pain    dr Channing Mutters, dr Farris Has, dr Ethelene Hal, herniated disc L4-5   OSA (obstructive sleep apnea)    dr Shelle Iron   PE (pulmonary embolism)    bilateral sep 2011 and again bilateral May 2012   Primary hypercoagulable state [facility])    no etiology found per Dr. Shirline Frees    Pulmonary embolism (HCC) 02/22/2010   CT angio Dx   Testosterone deficiency    dr Patsi Sears, shots every 2 weeks   Thrombosis of arm    left arm 08/2010   Past Surgical History:  Procedure Laterality Date   APPENDECTOMY     cardiac stress test x 2      COLONOSCOPY  08-19-09   per Dr. Evette Cristal, clear, repeat in 10 yrs    COLONOSCOPY WITH PROPOFOL N/A 10/15/2019   Procedure: COLONOSCOPY WITH PROPOFOL;  Surgeon: Graylin Shiver, MD;  Location: [facility];  Service: Endoscopy;  Laterality: N/A;   esi     L4-5 dr Channing Mutters 03/2010   ESOPHAGOGASTRODUODENOSCOPY     x2 - normal except reflux   KNEE SURGERY     both    LAPAROSCOPIC APPENDECTOMY  09/13/2012   Procedure: APPENDECTOMY LAPAROSCOPIC;  Surgeon: Almond Lint, MD;  Location: [facility];  Service: General;  Laterality: N/A;   ROTATOR CUFF REPAIR  08-20-10   left dr Wyline Mood complicated by PE   SHOULDER SURGERY     right and left   Social History   Socioeconomic History   Marital status: Married    Spouse name: Not on file   Number of children: 3   Years of education: Not on file   Highest education level: Not on file  Occupational History   Occupation: Desk job-petroleum dispatcher    Employer: [employer]  Tobacco Use   Smoking status: Never   Smokeless tobacco: Never   Tobacco comments:    never used product  Vaping Use   Vaping status: Never Used  Substance and Sexual Activity   Alcohol use: No    Alcohol/week: 0.0 standard drinks of alcohol   Drug use: No   Sexual activity: Not on file  Other Topics Concern   Not on file  Social History Narrative   Not on file   Social Determinants of Health   Financial Resource Strain: Not on file  Food Insecurity: Not on file  Transportation Needs: Not on file  Physical Activity: Not on file  Stress: Not on file  Social Connections: Unknown (05/01/2022)   [facility]   Social Network    Social Network: Not on file   Family History  Problem Relation Age of Onset   Hypertension Other    Cancer Other        lung   Coronary artery disease Neg Hx    Allergies  Allergen Reactions   Lisinopril Cough   Prior to Admission medications   Medication Sig Start Date End Date Taking? Authorizing Provider  ALPRAZolam Prudy Feeler) 1 MG tablet TAKE 1 TABLET BY MOUTH THREE TIMES DAILY AS NEEDED FOR ANXIETY 03/01/23   Nelwyn Salisbury, MD  azithromycin (ZITHROMAX Z-PAK) 250 MG tablet As directed 09/20/23   Nelwyn Salisbury, MD  Cholecalciferol (VITAMIN D-3 PO) Take by mouth daily. With Vitamin K    [provider]  diazepam (VALIUM) 10 MG tablet Take one tablet one  hour before MRI scan 08/22/23   Nelwyn Salisbury, MD  diphenhydramine-acetaminophen (TYLENOL PM) 25-500 MG TABS Take 2 tablets by mouth at bedtime.    [provider]  folic acid (FOLVITE) 1 MG tablet Take 1 tablet by mouth once daily 08/02/23   Nelwyn Salisbury, MD  metFORMIN (GLUCOPHAGE) 1000 MG tablet TAKE 1 TABLET BY MOUTH TWICE DAILY WITH A MEAL 07/13/23   Nelwyn Salisbury, MD  methocarbamol (ROBAXIN) 500 MG tablet Take 1 tablet (500 mg total) by mouth every 8 (eight) hours as needed for muscle spasms. 08/26/20   Nelwyn Salisbury, MD  metoCLOPramide (REGLAN) 10 MG tablet Take 1 tablet (10 mg total) by mouth 3 (three) times daily as needed for up to 10 days (hiccups). 05/27/23 06/06/23  Melton Alar R, PA-C  Multiple Vitamin (MULTIVITAMIN WITH MINERALS) TABS tablet Take 1 tablet by mouth once a week.     [provider]  omeprazole (PRILOSEC) 40 MG capsule Take 1 capsule (40 mg total) by mouth daily. 08/22/23   Nelwyn Salisbury, MD  oxyCODONE-acetaminophen (PERCOCET) 10-325 MG tablet Take 1 tablet by mouth every 6 (six) hours as needed for pain. 08/22/23   Nelwyn Salisbury, MD  oxyCODONE-acetaminophen (PERCOCET) 10-325 MG tablet Take 1 tablet by mouth every 6 (six) hours as needed for pain. 08/22/23   Nelwyn Salisbury, MD  oxyCODONE-acetaminophen (PERCOCET) 10-325 MG tablet Take 1 tablet by mouth every 6 (six) hours as needed for pain. 08/22/23   Nelwyn Salisbury, MD  Semaglutide, 2 MG/DOSE, 8 MG/3ML SOPN Inject 2 mg as directed once a week. 06/02/23   Nelwyn Salisbury, MD  SYRINGE-NEEDLE, DISP, 3 ML (B-D SYRINGE/NEEDLE 3CC/22GX1.5) 22G X 1-1/2" 3 ML MISC Inject 0.5 mL into the muscle each week. 07/14/20   Nelwyn Salisbury, MD  testosterone cypionate (DEPOTESTOSTERONE CYPIONATE) 200 MG/ML injection INJECT 1/2 (ONE-HALF) ML INTRAMUSCULARLY  TWICE A WEEK 05/29/23   Nelwyn Salisbury, MD  XARELTO 20 MG TABS tablet Take 1 tablet by mouth once daily 06/27/23   Nelwyn Salisbury, MD    ROS: All other systems have been reviewed  and were otherwise negative with the exception of those mentioned in the HPI and as above.  Physical Exam: General: Alert, no acute distress Cardiovascular: No pedal edema Respiratory: No cyanosis, no use of accessory musculature GI: No organomegaly, abdomen is soft and non-tender Skin: No lesions in the area of chief complaint Neurologic: Sensation intact distally Psychiatric: Patient is competent for consent with normal mood  and affect Lymphatic: No axillary or cervical lymphadenopathy  MUSCULOSKELETAL:  Range of motion of the shoulder is to 150 degrees; passive to 130.  He has pain during range of motion.  External rotation to 50, internal rotation to beltline.  4/5 cuff strength throughout.    Imaging: X-rays were reviewed which demonstrate a cuff tear arthropathy with Bo Merino classification  BMI: Estimated body mass index is 50.36 kg/m as calculated from the following:   Height as of 05/27/23: 5\' 10"  (1.778 m).   Weight as of 09/20/23: 159.2 kg.  Lab Results  Component Value Date   ALBUMIN 4.5 08/15/2022   Diabetes:   Patient has a diagnosis of diabetes,  Lab Results  Component Value Date   HGBA1C 5.8 (A) 06/02/2023   Smoking Status:   reports that he has never smoked. He has never used smokeless tobacco.     Assessment: left shoulder DJD, OA  Plan: Plan for Procedure(s): REVERSE SHOULDER ARTHROPLASTY OPEN DISTAL CLAVICLE EXCISION   The risks benefits and alternatives were discussed with the patient including but not limited to the risks of nonoperative treatment, versus surgical intervention including infection, bleeding, nerve injury,  blood clots, cardiopulmonary complications, morbidity, mortality, among others, and they were willing to proceed.   We additionally specifically discussed risks of axillary nerve injury, infection, periprosthetic fracture, continued pain and longevity of implants prior to beginning procedure.    Patient will be closely  monitored in PACU for medical stabilization and pain control. If found stable in PACU, patient may be discharged home with outpatient follow-up. If any concerns regarding patient's stabilization patient will be admitted for observation after surgery. The patient is planning to be discharged home with outpatient PT.   The patient acknowledged the explanation, agreed to proceed with the plan and consent was signed.   Operative Plan: Reverse left shoulder arthroplasty with open DCE Discharge Medications: Oxycodone, Tylenol, Zofran DVT Prophylaxis: Resume home Xarelto POD 1 Physical Therapy: 10/14 scheduled  Special Discharge needs: Sling, iceman    Corinna Capra, PA-C  09/20/2023 12:12 PM

## 2023-09-20 NOTE — Telephone Encounter (Signed)
Pt needs a new CPAP Machine his machine is broken down

## 2023-09-21 ENCOUNTER — Encounter (HOSPITAL_COMMUNITY): Payer: Self-pay | Admitting: Anesthesiology

## 2023-09-21 NOTE — Progress Notes (Signed)
Reviewed chart with Dr. Bradley Ferris at [facility]- pt with dx of bronchitis 09/20/23. Will need surgery postponed 2 weeks post resolution of symptoms before having surgery. Sherri at Dr. Austin Miles office aware

## 2023-09-25 NOTE — Telephone Encounter (Signed)
Updated order placed.

## 2023-09-26 NOTE — Addendum Note (Signed)
Addended by: Judd Gaudier on: 09/26/2023 02:43 PM   Modules accepted: Orders

## 2023-10-03 ENCOUNTER — Other Ambulatory Visit: Payer: Self-pay | Admitting: Family Medicine

## 2023-10-03 NOTE — Telephone Encounter (Signed)
Pt LOV was on 09/20/23 Last refill was done on 03/01/23 Please advise

## 2023-10-05 ENCOUNTER — Other Ambulatory Visit (HOSPITAL_COMMUNITY): Payer: Self-pay

## 2023-10-12 ENCOUNTER — Other Ambulatory Visit (HOSPITAL_COMMUNITY): Payer: Self-pay

## 2023-10-12 ENCOUNTER — Telehealth: Payer: Self-pay

## 2023-10-12 DIAGNOSIS — M50322 Other cervical disc degeneration at C5-C6 level: Secondary | ICD-10-CM | POA: Diagnosis not present

## 2023-10-12 DIAGNOSIS — M9901 Segmental and somatic dysfunction of cervical region: Secondary | ICD-10-CM | POA: Diagnosis not present

## 2023-10-12 DIAGNOSIS — M9902 Segmental and somatic dysfunction of thoracic region: Secondary | ICD-10-CM | POA: Diagnosis not present

## 2023-10-12 DIAGNOSIS — M5384 Other specified dorsopathies, thoracic region: Secondary | ICD-10-CM | POA: Diagnosis not present

## 2023-10-12 NOTE — Telephone Encounter (Signed)
Pharmacy Patient Advocate Encounter   Received notification from CoverMyMeds that prior authorization for oxyCODONE-Acetaminophen 10-325MG  tablets  is required/requested.   Insurance verification completed.   The patient is insured through [employer] .   Per test claim: APPROVED from 10/12/23 to 04/09/24    Key: [facility] PA Case: [id number]  Pharmacy aware

## 2023-10-13 ENCOUNTER — Other Ambulatory Visit: Payer: Self-pay | Admitting: Family Medicine

## 2023-10-16 DIAGNOSIS — M50322 Other cervical disc degeneration at C5-C6 level: Secondary | ICD-10-CM | POA: Diagnosis not present

## 2023-10-16 DIAGNOSIS — M9902 Segmental and somatic dysfunction of thoracic region: Secondary | ICD-10-CM | POA: Diagnosis not present

## 2023-10-16 DIAGNOSIS — M9901 Segmental and somatic dysfunction of cervical region: Secondary | ICD-10-CM | POA: Diagnosis not present

## 2023-10-16 DIAGNOSIS — M5384 Other specified dorsopathies, thoracic region: Secondary | ICD-10-CM | POA: Diagnosis not present

## 2023-10-23 DIAGNOSIS — M9901 Segmental and somatic dysfunction of cervical region: Secondary | ICD-10-CM | POA: Diagnosis not present

## 2023-10-23 DIAGNOSIS — M9902 Segmental and somatic dysfunction of thoracic region: Secondary | ICD-10-CM | POA: Diagnosis not present

## 2023-10-23 DIAGNOSIS — M5384 Other specified dorsopathies, thoracic region: Secondary | ICD-10-CM | POA: Diagnosis not present

## 2023-10-23 DIAGNOSIS — M50322 Other cervical disc degeneration at C5-C6 level: Secondary | ICD-10-CM | POA: Diagnosis not present

## 2023-10-24 DIAGNOSIS — G4733 Obstructive sleep apnea (adult) (pediatric): Secondary | ICD-10-CM | POA: Diagnosis not present

## 2023-10-31 DIAGNOSIS — M9901 Segmental and somatic dysfunction of cervical region: Secondary | ICD-10-CM | POA: Diagnosis not present

## 2023-10-31 DIAGNOSIS — M5384 Other specified dorsopathies, thoracic region: Secondary | ICD-10-CM | POA: Diagnosis not present

## 2023-10-31 DIAGNOSIS — M50322 Other cervical disc degeneration at C5-C6 level: Secondary | ICD-10-CM | POA: Diagnosis not present

## 2023-10-31 DIAGNOSIS — M9902 Segmental and somatic dysfunction of thoracic region: Secondary | ICD-10-CM | POA: Diagnosis not present

## 2023-11-04 ENCOUNTER — Other Ambulatory Visit: Payer: Self-pay | Admitting: Family Medicine

## 2023-11-08 DIAGNOSIS — M9901 Segmental and somatic dysfunction of cervical region: Secondary | ICD-10-CM | POA: Diagnosis not present

## 2023-11-08 DIAGNOSIS — M50322 Other cervical disc degeneration at C5-C6 level: Secondary | ICD-10-CM | POA: Diagnosis not present

## 2023-11-08 DIAGNOSIS — M9902 Segmental and somatic dysfunction of thoracic region: Secondary | ICD-10-CM | POA: Diagnosis not present

## 2023-11-08 DIAGNOSIS — M5384 Other specified dorsopathies, thoracic region: Secondary | ICD-10-CM | POA: Diagnosis not present

## 2023-11-14 DIAGNOSIS — M9902 Segmental and somatic dysfunction of thoracic region: Secondary | ICD-10-CM | POA: Diagnosis not present

## 2023-11-14 DIAGNOSIS — M50322 Other cervical disc degeneration at C5-C6 level: Secondary | ICD-10-CM | POA: Diagnosis not present

## 2023-11-14 DIAGNOSIS — M9901 Segmental and somatic dysfunction of cervical region: Secondary | ICD-10-CM | POA: Diagnosis not present

## 2023-11-14 DIAGNOSIS — M5384 Other specified dorsopathies, thoracic region: Secondary | ICD-10-CM | POA: Diagnosis not present

## 2023-11-15 ENCOUNTER — Other Ambulatory Visit: Payer: Self-pay | Admitting: Family Medicine

## 2023-11-22 DIAGNOSIS — M9902 Segmental and somatic dysfunction of thoracic region: Secondary | ICD-10-CM | POA: Diagnosis not present

## 2023-11-22 DIAGNOSIS — M9901 Segmental and somatic dysfunction of cervical region: Secondary | ICD-10-CM | POA: Diagnosis not present

## 2023-11-22 DIAGNOSIS — M50322 Other cervical disc degeneration at C5-C6 level: Secondary | ICD-10-CM | POA: Diagnosis not present

## 2023-11-22 DIAGNOSIS — M5384 Other specified dorsopathies, thoracic region: Secondary | ICD-10-CM | POA: Diagnosis not present

## 2023-11-23 DIAGNOSIS — G4733 Obstructive sleep apnea (adult) (pediatric): Secondary | ICD-10-CM | POA: Diagnosis not present

## 2023-11-29 DIAGNOSIS — M9901 Segmental and somatic dysfunction of cervical region: Secondary | ICD-10-CM | POA: Diagnosis not present

## 2023-11-29 DIAGNOSIS — M50322 Other cervical disc degeneration at C5-C6 level: Secondary | ICD-10-CM | POA: Diagnosis not present

## 2023-11-29 DIAGNOSIS — M5384 Other specified dorsopathies, thoracic region: Secondary | ICD-10-CM | POA: Diagnosis not present

## 2023-11-29 DIAGNOSIS — M9902 Segmental and somatic dysfunction of thoracic region: Secondary | ICD-10-CM | POA: Diagnosis not present

## 2023-12-04 ENCOUNTER — Other Ambulatory Visit: Payer: Self-pay | Admitting: Family Medicine

## 2023-12-04 ENCOUNTER — Ambulatory Visit: Payer: BC Managed Care – PPO | Admitting: Pulmonary Disease

## 2023-12-06 DIAGNOSIS — M50322 Other cervical disc degeneration at C5-C6 level: Secondary | ICD-10-CM | POA: Diagnosis not present

## 2023-12-06 DIAGNOSIS — M9902 Segmental and somatic dysfunction of thoracic region: Secondary | ICD-10-CM | POA: Diagnosis not present

## 2023-12-06 DIAGNOSIS — M9901 Segmental and somatic dysfunction of cervical region: Secondary | ICD-10-CM | POA: Diagnosis not present

## 2023-12-06 DIAGNOSIS — M5384 Other specified dorsopathies, thoracic region: Secondary | ICD-10-CM | POA: Diagnosis not present

## 2023-12-24 DIAGNOSIS — G4733 Obstructive sleep apnea (adult) (pediatric): Secondary | ICD-10-CM | POA: Diagnosis not present

## 2023-12-25 ENCOUNTER — Telehealth: Payer: Self-pay

## 2023-12-25 NOTE — Telephone Encounter (Signed)
 Copied from CRM [id number]. Topic: Clinical - Medication Question >> Dec 25, 2023  3:01 PM Deaijah H wrote: Reason for CRM: Patient called in wanting to know if Dr. Clent Ridges could switch over OZEMPIC, 2 MG/DOSE, 8 MG/3ML SOPN to [facility]

## 2023-12-26 ENCOUNTER — Other Ambulatory Visit: Payer: Self-pay | Admitting: Family Medicine

## 2023-12-26 NOTE — Telephone Encounter (Signed)
**Note De-identified  [place] Obfuscation** Please advise 

## 2023-12-27 MED ORDER — TIRZEPATIDE 10 MG/0.5ML ~~LOC~~ SOAJ: 10.0000 mg | SUBCUTANEOUS | 5 refills | Status: DC

## 2023-12-27 NOTE — Telephone Encounter (Signed)
 I stopped the Ozempic and sent in for Mounjaro 10 mg weekly

## 2023-12-27 NOTE — Addendum Note (Signed)
 Addended by: [name] A on: 12/27/2023 08:12 AM   Modules accepted: Orders

## 2023-12-28 ENCOUNTER — Other Ambulatory Visit: Payer: Self-pay | Admitting: Family Medicine

## 2024-01-01 NOTE — Telephone Encounter (Signed)
 Copied from CRM [id number]. Topic: Clinical - Prescription Issue >> Jan 01, 2024 10:19 AM Isabell A wrote: Reason for CRM: Patient states the pharmacy has not received the request for his testosterone  cypionate (DEPOTESTOSTERONE CYPIONATE) 200 MG/ML injection refill.

## 2024-01-01 NOTE — Telephone Encounter (Signed)
 Copied from CRM [id number]. Topic: Clinical - Prescription Issue >> Jan 01, 2024 11:10 AM Leotis ORN wrote: Reason for CRM: Cialis  ( tadalafil ) I tried to find it in current and past medications and did not see it on the list. The Pt is wanting to transfer this Rx to the [facility] ncpdp id [id number]   Pt callback [phone]

## 2024-01-05 ENCOUNTER — Telehealth: Payer: Self-pay

## 2024-01-05 MED ORDER — TADALAFIL 20 MG PO TABS: 20.0000 mg | ORAL_TABLET | ORAL | 3 refills | Status: AC | PRN

## 2024-01-05 NOTE — Telephone Encounter (Signed)
I already sent refills for testosterone to [facility] on 12-28-23. I just now sent Cialis to [facility] pharmacy

## 2024-01-05 NOTE — Telephone Encounter (Signed)
Copied from CRM [id number]. Topic: Clinical - Prescription Issue >> Jan 05, 2024  1:13 PM Elizebeth Brooking wrote: Reason for CRM: Patient called in regarding to testosterone cypionate (DEPOTESTOSTERONE CYPIONATE) 200 MG/ML injection , is asking about an status of prescription is requesting a callback

## 2024-01-08 NOTE — Telephone Encounter (Signed)
Send MyChart message to pt regarding refill

## 2024-01-09 ENCOUNTER — Other Ambulatory Visit: Payer: Self-pay | Admitting: Family Medicine

## 2024-01-10 ENCOUNTER — Ambulatory Visit: Payer: BC Managed Care – PPO | Admitting: Family Medicine

## 2024-01-10 ENCOUNTER — Encounter: Payer: Self-pay | Admitting: Family Medicine

## 2024-01-10 VITALS — BP 130/78 | HR 92 | Temp 99.7°F | Wt 359.0 lb

## 2024-01-10 DIAGNOSIS — J101 Influenza due to other identified influenza virus with other respiratory manifestations: Secondary | ICD-10-CM

## 2024-01-10 DIAGNOSIS — R059 Cough, unspecified: Secondary | ICD-10-CM | POA: Diagnosis not present

## 2024-01-10 LAB — POCT INFLUENZA A/B
Influenza A, POC: POSITIVE — AB
Influenza B, POC: NEGATIVE

## 2024-01-10 LAB — POC COVID19 BINAXNOW: SARS Coronavirus 2 Ag: NEGATIVE

## 2024-01-10 MED ORDER — OSELTAMIVIR PHOSPHATE 75 MG PO CAPS: 75.0000 mg | ORAL_CAPSULE | Freq: Two times a day (BID) | ORAL | 0 refills | Status: DC

## 2024-01-10 MED ORDER — HYDROCODONE BIT-HOMATROP MBR 5-1.5 MG/5ML PO SOLN: 5.0000 mL | ORAL | 0 refills | Status: AC | PRN

## 2024-01-10 NOTE — Progress Notes (Signed)
   Subjective:    Patient ID: Gregory Cortez, male    DOB: 12-06-1956, 68 y.o.   MRN: [id number]  HPI Here for 2 days of dry cough, body aches, and fever. Drinking fluids.    Review of Systems  Constitutional:  Positive for fever.  HENT:  Positive for congestion. Negative for ear pain, postnasal drip, sinus pain and sore throat.   Eyes: Negative.   Respiratory:  Positive for cough, chest tightness and shortness of breath. Negative for wheezing.        Objective:   Physical Exam Constitutional:      Appearance: He is ill-appearing.  HENT:     Right Ear: Tympanic membrane, ear canal and external ear normal.     Left Ear: Tympanic membrane, ear canal and external ear normal.     Nose: Nose normal.     Mouth/Throat:     Pharynx: Oropharynx is clear.  Eyes:     Conjunctiva/sclera: Conjunctivae normal.  Pulmonary:     Effort: Pulmonary effort is normal.     Breath sounds: Normal breath sounds. No wheezing, rhonchi or rales.  Lymphadenopathy:     Cervical: No cervical adenopathy.  Neurological:     Mental Status: He is alert.           Assessment & Plan:  Influenza A. Treat with 5 days of Tamiflu.  [name], MD

## 2024-01-10 NOTE — Addendum Note (Signed)
Addended by: Carola Rhine on: 01/10/2024 04:14 PM   Modules accepted: Orders

## 2024-01-11 ENCOUNTER — Ambulatory Visit (HOSPITAL_BASED_OUTPATIENT_CLINIC_OR_DEPARTMENT_OTHER): Admit: 2024-01-11 | Payer: BC Managed Care – PPO | Admitting: Orthopaedic Surgery

## 2024-01-11 ENCOUNTER — Encounter (HOSPITAL_BASED_OUTPATIENT_CLINIC_OR_DEPARTMENT_OTHER): Payer: Self-pay

## 2024-01-11 SURGERY — REVERSE SHOULDER ARTHROPLASTY
Anesthesia: General | Site: Shoulder | Laterality: Left

## 2024-02-02 ENCOUNTER — Other Ambulatory Visit: Payer: Self-pay | Admitting: Family Medicine

## 2024-02-02 NOTE — Telephone Encounter (Signed)
Copied from CRM [id number]. Topic: Clinical - Medication Refill >> Feb 02, 2024  3:20 PM Fredrich Romans wrote: Most Recent Primary Care Visit:  Provider: [name] A  Department: [facility]  Visit Type: ACUTE  Date: 01/10/2024  Medication: oxyCODONE-acetaminophen (PERCOCET) 10-325 MG tablet  Has the patient contacted their pharmacy? No (Agent: If no, request that the patient contact the pharmacy for the refill. If patient does not wish to contact the pharmacy document the reason why and proceed with request.) (Agent: If yes, when and what did the pharmacy advise?)  Is this the correct pharmacy for this prescription? Yes If no, delete pharmacy and type the correct one.  This is the patient's preferred pharmacy:  [facility] [address] [place] - [address] [place] [address] Phone: [phone] Fax: [phone]    Has the prescription been filled recently? No  Is the patient out of the medication? Yes  Has the patient been seen for an appointment in the last year OR does the patient have an upcoming appointment? Yes  Can we respond through MyChart? Yes  Agent: Please be advised that Rx refills may take up to 3 business days. We ask that you follow-up with your pharmacy.

## 2024-02-06 NOTE — Telephone Encounter (Signed)
FYI Pt has a VV for his PMV on 02/10/24

## 2024-02-12 ENCOUNTER — Encounter: Payer: Self-pay | Admitting: Family Medicine

## 2024-02-12 ENCOUNTER — Telehealth (INDEPENDENT_AMBULATORY_CARE_PROVIDER_SITE_OTHER): Payer: BC Managed Care – PPO | Admitting: Family Medicine

## 2024-02-12 DIAGNOSIS — M25561 Pain in right knee: Secondary | ICD-10-CM | POA: Diagnosis not present

## 2024-02-12 DIAGNOSIS — G8929 Other chronic pain: Secondary | ICD-10-CM | POA: Diagnosis not present

## 2024-02-12 DIAGNOSIS — M25562 Pain in left knee: Secondary | ICD-10-CM | POA: Diagnosis not present

## 2024-02-12 DIAGNOSIS — F1190 Opioid use, unspecified, uncomplicated: Secondary | ICD-10-CM | POA: Diagnosis not present

## 2024-02-12 MED ORDER — OXYCODONE-ACETAMINOPHEN 10-325 MG PO TABS: 1.0000 | ORAL_TABLET | Freq: Four times a day (QID) | ORAL | 0 refills | Status: DC | PRN

## 2024-02-12 MED ORDER — OXYCODONE-ACETAMINOPHEN 10-325 MG PO TABS
1.0000 | ORAL_TABLET | Freq: Four times a day (QID) | ORAL | 0 refills | Status: DC | PRN
Start: 2024-02-12 — End: 2024-09-23

## 2024-02-12 NOTE — Progress Notes (Signed)
 Subjective:    Patient ID: Gregory Cortez, male    DOB: 05-05-1956, 68 y.o.   MRN: [id number]  HPI Virtual Visit via Video Note  I connected with the patient on 02/12/24 at 11:00 AM EST by a video enabled telemedicine application and verified that I am speaking with the correct person using two identifiers.  Location patient: home Location provider:work or home office Persons participating in the virtual visit: patient, provider  I discussed the limitations of evaluation and management by telemedicine and the availability of in person appointments. The patient expressed understanding and agreed to proceed.   HPI: Here for pain management. He is doing well.    ROS: See pertinent positives and negatives per HPI.  Past Medical History:  Diagnosis Date   Chest pain    Diabetes mellitus type 2 in obese 11/29/2016   ED (erectile dysfunction)    GERD (gastroesophageal reflux disease)    [facility]   Hx of colonoscopy    Low back pain    dr Channing Mutters, dr Farris Has, dr Ethelene Hal, herniated disc L4-5   OSA (obstructive sleep apnea)    dr Shelle Iron   PE (pulmonary embolism)    bilateral sep 2011 and again bilateral May 2012   Primary hypercoagulable state [facility])    no etiology found per Dr. Shirline Frees    Pulmonary embolism (HCC) 02/22/2010   CT angio Dx   Testosterone deficiency    dr Patsi Sears, shots every 2 weeks   Thrombosis of arm    left arm 08/2010    Past Surgical History:  Procedure Laterality Date   APPENDECTOMY     cardiac stress test x 2      COLONOSCOPY  08-19-09   per Dr. Evette Cristal, clear, repeat in 10 yrs    COLONOSCOPY WITH PROPOFOL N/A 10/15/2019   Procedure: COLONOSCOPY WITH PROPOFOL;  Surgeon: Graylin Shiver, MD;  Location: [facility];  Service: Endoscopy;  Laterality: N/A;   esi     L4-5 dr Channing Mutters 03/2010   ESOPHAGOGASTRODUODENOSCOPY     x2 - normal except reflux   KNEE SURGERY     both   LAPAROSCOPIC APPENDECTOMY  09/13/2012   Procedure: APPENDECTOMY LAPAROSCOPIC;  Surgeon:  Almond Lint, MD;  Location: [facility]S;  Service: General;  Laterality: N/A;   ROTATOR CUFF REPAIR  08-20-10   left dr Wyline Mood complicated by PE   SHOULDER SURGERY     right and left    Family History  Problem Relation Age of Onset   Hypertension Other    Cancer Other        lung   Coronary artery disease Neg Hx      Current Outpatient Medications:    ALPRAZolam (XANAX) 1 MG tablet, TAKE 1 TABLET BY MOUTH THREE TIMES DAILY AS NEEDED FOR ANXIETY, Disp: 90 tablet, Rfl: 5   Cholecalciferol (VITAMIN D-3 PO), Take by mouth daily. With Vitamin K, Disp: , Rfl:    diazepam (VALIUM) 10 MG tablet, Take one tablet one hour before MRI scan, Disp: 6 tablet, Rfl: 0   diphenhydramine-acetaminophen (TYLENOL PM) 25-500 MG TABS, Take 2 tablets by mouth at bedtime., Disp: , Rfl:    folic acid (FOLVITE) 1 MG tablet, Take 1 tablet by mouth once daily, Disp: 90 tablet, Rfl: 0   HYDROcodone bit-homatropine (HYCODAN) 5-1.5 MG/5ML syrup, Take 5 mLs by mouth every 4 (four) hours as needed., Disp: 240 mL, Rfl: 0   metFORMIN (GLUCOPHAGE) 1000 MG tablet, TAKE 1 TABLET BY MOUTH TWICE DAILY  WITH A MEAL, Disp: 180 tablet, Rfl: 0   methocarbamol (ROBAXIN) 500 MG tablet, Take 1 tablet (500 mg total) by mouth every 8 (eight) hours as needed for muscle spasms., Disp: 270 tablet, Rfl: 3   Multiple Vitamin (MULTIVITAMIN WITH MINERALS) TABS tablet, Take 1 tablet by mouth once a week. , Disp: , Rfl:    omeprazole (PRILOSEC) 40 MG capsule, Take 1 capsule (40 mg total) by mouth daily., Disp: 90 capsule, Rfl: 3   oseltamivir (TAMIFLU) 75 MG capsule, Take 1 capsule (75 mg total) by mouth 2 (two) times daily., Disp: 10 capsule, Rfl: 0   SYRINGE-NEEDLE, DISP, 3 ML (B-D SYRINGE/NEEDLE 3CC/22GX1.5) 22G X 1-1/2" 3 ML MISC, Inject 0.5 mL into the muscle each week., Disp: 8 each, Rfl: 3   tadalafil (CIALIS) 20 MG tablet, Take 1 tablet (20 mg total) by mouth every other day as needed for erectile dysfunction., Disp: 45 tablet, Rfl: 3    testosterone cypionate (DEPOTESTOSTERONE CYPIONATE) 200 MG/ML injection, INJECT 1/2 (ONE-HALF) ML INTRAMUSCULARLY  TWICE A WEEK, Disp: 10 mL, Rfl: 1   tirzepatide (MOUNJARO) 10 MG/0.5ML Pen, Inject 10 mg into the skin once a week., Disp: 6 mL, Rfl: 5   XARELTO 20 MG TABS tablet, Take 1 tablet by mouth once daily, Disp: 90 tablet, Rfl: 3   metoCLOPramide (REGLAN) 10 MG tablet, Take 1 tablet (10 mg total) by mouth 3 (three) times daily as needed for up to 10 days (hiccups)., Disp: 30 tablet, Rfl: 0   oxyCODONE-acetaminophen (PERCOCET) 10-325 MG tablet, Take 1 tablet by mouth every 6 (six) hours as needed for pain., Disp: 120 tablet, Rfl: 0   oxyCODONE-acetaminophen (PERCOCET) 10-325 MG tablet, Take 1 tablet by mouth every 6 (six) hours as needed for pain., Disp: 120 tablet, Rfl: 0   oxyCODONE-acetaminophen (PERCOCET) 10-325 MG tablet, Take 1 tablet by mouth every 6 (six) hours as needed for pain., Disp: 120 tablet, Rfl: 0  EXAM:  VITALS per patient if applicable:  GENERAL: alert, oriented, appears well and in no acute distress  HEENT: atraumatic, conjunttiva clear, no obvious abnormalities on inspection of external nose and ears  NECK: normal movements of the head and neck  LUNGS: on inspection no signs of respiratory distress, breathing rate appears normal, no obvious gross SOB, gasping or wheezing  CV: no obvious cyanosis  MS: moves all visible extremities without noticeable abnormality  PSYCH/NEURO: pleasant and cooperative, no obvious depression or anxiety, speech and thought processing grossly intact  ASSESSMENT AND PLAN: Pain management.  Indication for chronic opioid: knee pain Medication and dose: Percocet 10-325 # pills per month: 120 Last UDS date: 08-22-23 Opioid Treatment Agreement signed (Y/N): 01-28-20 Opioid Treatment Agreement last reviewed with patient:  02-12-24 [facility] reviewed this encounter (include red flags): Yes Meds were refilled.  [name], MD  Discussed the  following assessment and plan:  No diagnosis found.     I discussed the assessment and treatment plan with the patient. The patient was provided an opportunity to ask questions and all were answered. The patient agreed with the plan and demonstrated an understanding of the instructions.   The patient was advised to call back or seek an in-person evaluation if the symptoms worsen or if the condition fails to improve as anticipated.      Review of Systems     Objective:   Physical Exam        Assessment & Plan:

## 2024-02-14 ENCOUNTER — Other Ambulatory Visit: Payer: Self-pay | Admitting: Family Medicine

## 2024-04-06 ENCOUNTER — Other Ambulatory Visit: Payer: Self-pay | Admitting: Family Medicine

## 2024-04-17 ENCOUNTER — Telehealth: Payer: Self-pay

## 2024-04-17 NOTE — Telephone Encounter (Signed)
 Copied from CRM [id number]. Topic: Clinical - Medication Question >> Apr 17, 2024  3:46 PM Dyann Glaser G wrote: Reason for CRM: PT IS REQUESTING TO DECREASE THIS MED TO 5 MG DUE TO SOME STOMACH ISSUES tirzepatide (MOUNJARO) 10 MG/0.5ML Pen

## 2024-04-19 MED ORDER — TIRZEPATIDE 5 MG/0.5ML ~~LOC~~ SOAJ: 5.0000 mg | SUBCUTANEOUS | 5 refills | Status: DC

## 2024-04-19 NOTE — Addendum Note (Signed)
 Addended by: Corita Diego A on: 04/19/2024 08:01 AM   Modules accepted: Orders

## 2024-04-19 NOTE — Telephone Encounter (Signed)
 Done

## 2024-04-29 ENCOUNTER — Telehealth: Payer: Self-pay

## 2024-04-29 ENCOUNTER — Other Ambulatory Visit (HOSPITAL_COMMUNITY): Payer: Self-pay

## 2024-04-29 NOTE — Telephone Encounter (Signed)
 Pharmacy Patient Advocate Encounter  Received notification from [facility] that Prior Authorization for oxyCODONE -Acetaminophen  10-325MG  tablets has been APPROVED from 04/29/24 to 10/26/24. Ran test claim, Copay is $19.95. This test claim was processed through [facility]- copay amounts may vary at other pharmacies due to pharmacy/plan contracts, or as the patient moves through the different stages of their insurance plan.   PA #/Case ID/Reference #: [id number]

## 2024-05-06 DIAGNOSIS — M533 Sacrococcygeal disorders, not elsewhere classified: Secondary | ICD-10-CM | POA: Diagnosis not present

## 2024-05-17 ENCOUNTER — Other Ambulatory Visit: Payer: Self-pay | Admitting: Family Medicine

## 2024-06-14 ENCOUNTER — Other Ambulatory Visit: Payer: Self-pay | Admitting: Family Medicine

## 2024-06-17 ENCOUNTER — Ambulatory Visit: Payer: Self-pay

## 2024-06-17 MED ORDER — TIRZEPATIDE 10 MG/0.5ML ~~LOC~~ SOAJ: 10.0000 mg | SUBCUTANEOUS | 5 refills | Status: DC

## 2024-06-17 NOTE — Telephone Encounter (Signed)
 I wrote a RX for the 10 mg dose and this is ready to be picked up ([facility] would not let me email it in)

## 2024-06-17 NOTE — Telephone Encounter (Signed)
 Copied from CRM [id number]. Topic: Clinical - Medication Question >> Jun 17, 2024 10:02 AM Lavanda D wrote: Reason for CRM: Patient calling because his mounjaro  10mg  had been changed to 5mg  due to stomach problems. He thinks his stomach is now used to the tirzepatide  (MOUNJARO ) 5 MG/0.5ML Pen and he would like to go back up to 10.

## 2024-06-18 NOTE — Telephone Encounter (Signed)
 Pt Rx faxed to the pharmacy, pt notified via MyChart

## 2024-07-02 ENCOUNTER — Other Ambulatory Visit: Payer: Self-pay | Admitting: Family Medicine

## 2024-07-02 NOTE — Telephone Encounter (Unsigned)
 Copied from CRM [id number]. Topic: Clinical - Medication Refill >> Jul 02, 2024 11:06 AM Larissa S wrote: Medication:  methocarbamol  (ROBAXIN ) 500 MG tablet omeprazole  (PRILOSEC) 40 MG capsule testosterone  cypionate (DEPOTESTOSTERONE CYPIONATE) 200 MG/ML injection  Has the patient contacted their pharmacy? Yes (Agent: If no, request that the patient contact the pharmacy for the refill. If patient does not wish to contact the pharmacy document the reason why and proceed with request.) (Agent: If yes, when and what did the pharmacy advise?)  This is the patient's preferred pharmacy:  [facility] [address], [place] - [address] [place] [address] Phone: [phone] Fax: [phone]    Is this the correct pharmacy for this prescription? Yes If no, delete pharmacy and type the correct one.   Has the prescription been filled recently? No  Is the patient out of the medication? Yes  Has the patient been seen for an appointment in the last year OR does the patient have an upcoming appointment? Yes  Can we respond through MyChart? Yes  Agent: Please be advised that Rx refills may take up to 3 business days. We ask that you follow-up with your pharmacy.

## 2024-07-03 MED ORDER — OMEPRAZOLE 40 MG PO CPDR: 40.0000 mg | DELAYED_RELEASE_CAPSULE | Freq: Every day | ORAL | 3 refills | Status: AC

## 2024-07-03 MED ORDER — METHOCARBAMOL 500 MG PO TABS: 500.0000 mg | ORAL_TABLET | Freq: Three times a day (TID) | ORAL | 3 refills | Status: AC | PRN

## 2024-07-03 NOTE — Telephone Encounter (Signed)
 Pt needs refill on Testosterone , please advise

## 2024-07-10 ENCOUNTER — Other Ambulatory Visit: Payer: Self-pay | Admitting: Family Medicine

## 2024-07-24 DIAGNOSIS — M533 Sacrococcygeal disorders, not elsewhere classified: Secondary | ICD-10-CM | POA: Diagnosis not present

## 2024-08-01 ENCOUNTER — Telehealth: Payer: Self-pay

## 2024-08-01 MED ORDER — TIRZEPATIDE 12.5 MG/0.5ML ~~LOC~~ SOAJ: 12.5000 mg | SUBCUTANEOUS | 5 refills | Status: DC

## 2024-08-01 NOTE — Telephone Encounter (Signed)
 Done

## 2024-08-01 NOTE — Telephone Encounter (Signed)
 Copied from CRM [id number]. Topic: Clinical - Medication Question >> Aug 01, 2024  9:53 AM Mia F wrote: Reason for CRM: Pt is asking if he could increase the dosage on the tirzepatide  (MOUNJARO ) 10 MG/0.5ML Pen. Pt is available via My Chart.   Please advise

## 2024-08-02 ENCOUNTER — Other Ambulatory Visit: Payer: Self-pay | Admitting: Family Medicine

## 2024-08-02 NOTE — Telephone Encounter (Signed)
 Copied from CRM #[id number]. Topic: Clinical - Medication Refill >> Aug 02, 2024  1:31 PM Roselie BROCKS wrote: Medication: oxyCODONE -acetaminophen  (PERCOCET) 10-325 MG tablet   Has the patient contacted their pharmacy? Yes (Agent: If no, request that the patient contact the pharmacy for the refill. If patient does not wish to contact the pharmacy document the reason why and proceed with request.) (Agent: If yes, when and what did the pharmacy advise?)  This is the patient's preferred pharmacy:  [facility] [address] [place] - [address] [place] [address] Phone: [phone] Fax: [phone]   Is this the correct pharmacy for this prescription? Yes If no, delete pharmacy and type the correct one.   Has the prescription been filled recently? No  Is the patient out of the medication? Yes  Has the patient been seen for an appointment in the last year OR does the patient have an upcoming appointment? Yes  Can we respond through MyChart? Yes  Agent: Please be advised that Rx refills may take up to 3 business days. We ask that you follow-up with your pharmacy.

## 2024-08-12 ENCOUNTER — Other Ambulatory Visit: Payer: Self-pay | Admitting: Family Medicine

## 2024-08-26 ENCOUNTER — Ambulatory Visit: Payer: Self-pay

## 2024-08-26 NOTE — Telephone Encounter (Signed)
 Noted

## 2024-08-26 NOTE — Telephone Encounter (Signed)
 FYI Only or Action Required?: FYI only for provider.  Patient was last seen in primary care on 02/12/2024 by Johnny Garnette LABOR, MD.  Called Nurse Triage reporting Dizziness.  Symptoms began about a month ago.  Interventions attempted: Nothing.  Symptoms are: unchanged.  Triage Disposition: See Physician Within 24 Hours  Patient/caregiver understands and will follow disposition?: Yes     Copied from CRM #[id number]. Topic: Clinical - Red Word Triage >> Aug 26, 2024  8:13 AM Suzen RAMAN wrote: Red Word that prompted transfer to Nurse Triage: dizzy spells Reason for Disposition  [1] MODERATE dizziness (e.g., interferes with normal activities) AND [2] has NOT been evaluated by doctor (or NP/PA) for this  (Exception: Dizziness caused by heat exposure, sudden standing, or poor fluid intake.)  Answer Assessment - Initial Assessment Questions 1. DESCRIPTION: Describe your dizziness.     States can be sitting down or driving and his head starts spinning 2. LIGHTHEADED: Do you feel lightheaded? (e.g., somewhat faint, woozy, weak upon standing)     Light headed, feels like going to pass out, states if he bends over and sneezes has to hold on to the wall 3. VERTIGO: Do you feel like either you or the room is spinning or tilting? (i.e., vertigo)     States has ear problems and has been told he does, spinning room 4. SEVERITY: How bad is it?  Do you feel like you are going to faint? Can you stand and walk?     Going to faint 5. ONSET:  When did the dizziness begin?     Month or so ago 6. AGGRAVATING FACTORS: Does anything make it worse? (e.g., standing, change in head position)     Bending over 7. HEART RATE: Can you tell me your heart rate? How many beats in 15 seconds?  (Note: Not all patients can do this.)       denies 8. CAUSE: What do you think is causing the dizziness? (e.g., decreased fluids or food, diarrhea, emotional distress, heat exposure, new medicine, sudden  standing, vomiting; unknown)    denies 9. RECURRENT SYMPTOM: Have you had dizziness before? If Yes, ask: When was the last time? What happened that time?     yes 10. OTHER SYMPTOMS: Do you have any other symptoms? (e.g., fever, chest pain, vomiting, diarrhea, bleeding)       denies 11. PREGNANCY: Is there any chance you are pregnant? When was your last menstrual period?       na  Protocols used: Dizziness - Lightheadedness-A-AH

## 2024-08-27 ENCOUNTER — Encounter: Payer: Self-pay | Admitting: Family Medicine

## 2024-08-27 ENCOUNTER — Ambulatory Visit (INDEPENDENT_AMBULATORY_CARE_PROVIDER_SITE_OTHER): Admitting: Family Medicine

## 2024-08-27 VITALS — BP 130/76 | HR 78 | Temp 98.4°F | Wt 351.0 lb

## 2024-08-27 DIAGNOSIS — E538 Deficiency of other specified B group vitamins: Secondary | ICD-10-CM | POA: Diagnosis not present

## 2024-08-27 DIAGNOSIS — R0602 Shortness of breath: Secondary | ICD-10-CM

## 2024-08-27 DIAGNOSIS — R42 Dizziness and giddiness: Secondary | ICD-10-CM | POA: Diagnosis not present

## 2024-08-27 DIAGNOSIS — E039 Hypothyroidism, unspecified: Secondary | ICD-10-CM | POA: Diagnosis not present

## 2024-08-27 DIAGNOSIS — E669 Obesity, unspecified: Secondary | ICD-10-CM

## 2024-08-27 DIAGNOSIS — E291 Testicular hypofunction: Secondary | ICD-10-CM | POA: Diagnosis not present

## 2024-08-27 DIAGNOSIS — E1169 Type 2 diabetes mellitus with other specified complication: Secondary | ICD-10-CM | POA: Diagnosis not present

## 2024-08-27 DIAGNOSIS — D509 Iron deficiency anemia, unspecified: Secondary | ICD-10-CM

## 2024-08-27 LAB — CBC WITH DIFFERENTIAL/PLATELET
Basophils Absolute: 0.1 K/uL (ref 0.0–0.1)
Basophils Relative: 1.6 % (ref 0.0–3.0)
Eosinophils Absolute: 0.2 K/uL (ref 0.0–0.7)
Eosinophils Relative: 3.1 % (ref 0.0–5.0)
HCT: 33.3 % — ABNORMAL LOW (ref 39.0–52.0)
Hemoglobin: 9.9 g/dL — ABNORMAL LOW (ref 13.0–17.0)
Lymphocytes Relative: 26.2 % (ref 12.0–46.0)
Lymphs Abs: 1.3 K/uL (ref 0.7–4.0)
MCHC: 29.6 g/dL — ABNORMAL LOW (ref 30.0–36.0)
MCV: 61.7 fl — ABNORMAL LOW (ref 78.0–100.0)
Monocytes Absolute: 0.5 K/uL (ref 0.1–1.0)
Monocytes Relative: 9.2 % (ref 3.0–12.0)
Neutro Abs: 3 K/uL (ref 1.4–7.7)
Neutrophils Relative %: 59.9 % (ref 43.0–77.0)
Platelets: 384 K/uL (ref 150.0–400.0)
RBC: 5.4 Mil/uL (ref 4.22–5.81)
RDW: 21.4 % — ABNORMAL HIGH (ref 11.5–15.5)
WBC: 4.9 K/uL (ref 4.0–10.5)

## 2024-08-27 LAB — LIPID PANEL
Cholesterol: 133 mg/dL (ref 0–200)
HDL: 23 mg/dL — ABNORMAL LOW (ref >39.00)
LDL Cholesterol: 74 mg/dL (ref 0–99)
NonHDL: 109.63
Total CHOL/HDL Ratio: 6
Triglycerides: 179 mg/dL — ABNORMAL HIGH (ref 0.0–149.0)
VLDL: 35.8 mg/dL (ref 0.0–40.0)

## 2024-08-27 LAB — HEPATIC FUNCTION PANEL
ALT: 22 U/L (ref 0–53)
AST: 23 U/L (ref 0–37)
Albumin: 4.4 g/dL (ref 3.5–5.2)
Alkaline Phosphatase: 60 U/L (ref 39–117)
Bilirubin, Direct: 0.1 mg/dL (ref 0.0–0.3)
Total Bilirubin: 0.5 mg/dL (ref 0.2–1.2)
Total Protein: 7.1 g/dL (ref 6.0–8.3)

## 2024-08-27 LAB — TSH: TSH: 1.75 u[IU]/mL (ref 0.35–5.50)

## 2024-08-27 LAB — HEMOGLOBIN A1C: Hgb A1c MFr Bld: 6.1 % (ref 4.6–6.5)

## 2024-08-27 LAB — BASIC METABOLIC PANEL WITH GFR
BUN: 10 mg/dL (ref 6–23)
CO2: 27 meq/L (ref 19–32)
Calcium: 9.6 mg/dL (ref 8.4–10.5)
Chloride: 104 meq/L (ref 96–112)
Creatinine, Ser: 0.87 mg/dL (ref 0.40–1.50)
GFR: 90.37 mL/min (ref >60.00)
Glucose, Bld: 107 mg/dL — ABNORMAL HIGH (ref 70–99)
Potassium: 4.2 meq/L (ref 3.5–5.1)
Sodium: 139 meq/L (ref 135–145)

## 2024-08-27 LAB — TESTOSTERONE: Testosterone: 520.18 ng/dL (ref 300.00–890.00)

## 2024-08-27 LAB — VITAMIN B12: Vitamin B-12: 251 pg/mL (ref 211–911)

## 2024-08-27 MED ORDER — MECLIZINE HCL 25 MG PO TABS: 25.0000 mg | ORAL_TABLET | ORAL | 2 refills | Status: AC | PRN

## 2024-08-27 NOTE — Progress Notes (Signed)
   Subjective:    Patient ID: Gregory Cortez, male    DOB: 12-Jan-1956, 68 y.o.   MRN: [id number]  HPI Here for several issues. First he complains of dizziness that started several years ago. Lately this has become more frequent, and he is now having dizzy spells every day. These make it seems as though the room is spinning. He may have some slight dizziness when sitting still, but it gets much worse when he looks down or leans forward. No nausea. No headaches. Second he complains of SOB on exertion for the past few months. No chest pain. In June he had a chest CT angiogram that was unremarkable.    Review of Systems  Constitutional: Negative.   Respiratory:  Positive for shortness of breath. Negative for cough and wheezing.   Cardiovascular: Negative.   Neurological:  Positive for dizziness. Negative for tremors, seizures, syncope, facial asymmetry, speech difficulty, weakness, numbness and headaches.       Objective:   Physical Exam Constitutional:      Appearance: He is not ill-appearing.  Cardiovascular:     Rate and Rhythm: Normal rate and regular rhythm.     Pulses: Normal pulses.     Heart sounds: Normal heart sounds.  Pulmonary:     Effort: Pulmonary effort is normal.     Breath sounds: Normal breath sounds.  Musculoskeletal:     Right lower leg: No edema.     Left lower leg: No edema.  Neurological:     Mental Status: He is alert and oriented to person, place, and time.     Coordination: Coordination normal.     Gait: Gait normal.           Assessment & Plan:  He has longstanding vertigo, and he can try Meclizine  as needed for this. We will set up a brain MRI as well as carotid dopplers to evaluate this further. For the SOB, we will set up an ECHO soon, and we will get labs today.  Garnette Olmsted, MD

## 2024-08-28 ENCOUNTER — Encounter: Payer: Self-pay | Admitting: Family Medicine

## 2024-08-28 ENCOUNTER — Ambulatory Visit: Payer: Self-pay | Admitting: Family Medicine

## 2024-08-28 NOTE — Addendum Note (Signed)
 Addended by: JOHNNY SENIOR A on: 08/28/2024 08:03 AM   Modules accepted: Orders

## 2024-08-29 ENCOUNTER — Other Ambulatory Visit: Payer: Self-pay

## 2024-08-29 MED ORDER — IRON (FERROUS SULFATE) 325 (65 FE) MG PO TABS: 325.0000 mg | ORAL_TABLET | Freq: Every day | ORAL | 3 refills | Status: AC

## 2024-09-11 ENCOUNTER — Ambulatory Visit (INDEPENDENT_AMBULATORY_CARE_PROVIDER_SITE_OTHER): Admitting: Family Medicine

## 2024-09-11 ENCOUNTER — Other Ambulatory Visit: Payer: Self-pay | Admitting: Family Medicine

## 2024-09-11 ENCOUNTER — Encounter: Payer: Self-pay | Admitting: Family Medicine

## 2024-09-11 VITALS — BP 124/76 | HR 75 | Temp 98.1°F | Wt 351.0 lb

## 2024-09-11 DIAGNOSIS — D509 Iron deficiency anemia, unspecified: Secondary | ICD-10-CM | POA: Diagnosis not present

## 2024-09-11 DIAGNOSIS — R42 Dizziness and giddiness: Secondary | ICD-10-CM | POA: Insufficient documentation

## 2024-09-11 DIAGNOSIS — J019 Acute sinusitis, unspecified: Secondary | ICD-10-CM

## 2024-09-11 LAB — IBC + FERRITIN
Ferritin: 9.1 ng/mL — ABNORMAL LOW (ref 22.0–322.0)
Iron: 35 ug/dL — ABNORMAL LOW (ref 42–165)
Saturation Ratios: 7.4 % — ABNORMAL LOW (ref 20.0–50.0)
TIBC: 470.4 ug/dL — ABNORMAL HIGH (ref 250.0–450.0)
Transferrin: 336 mg/dL (ref 212.0–360.0)

## 2024-09-11 LAB — CBC WITH DIFFERENTIAL/PLATELET
Basophils Absolute: 0.1 K/uL (ref 0.0–0.1)
Basophils Relative: 1.4 % (ref 0.0–3.0)
Eosinophils Absolute: 0.2 K/uL (ref 0.0–0.7)
Eosinophils Relative: 4.2 % (ref 0.0–5.0)
HCT: 36.2 % — ABNORMAL LOW (ref 39.0–52.0)
Hemoglobin: 10.5 g/dL — ABNORMAL LOW (ref 13.0–17.0)
Lymphocytes Relative: 21.5 % (ref 12.0–46.0)
Lymphs Abs: 1 K/uL (ref 0.7–4.0)
MCHC: 29.1 g/dL — ABNORMAL LOW (ref 30.0–36.0)
MCV: 65.8 fl — ABNORMAL LOW (ref 78.0–100.0)
Monocytes Absolute: 0.4 K/uL (ref 0.1–1.0)
Monocytes Relative: 9.5 % (ref 3.0–12.0)
Neutro Abs: 2.9 K/uL (ref 1.4–7.7)
Neutrophils Relative %: 63.4 % (ref 43.0–77.0)
Platelets: 292 K/uL (ref 150.0–400.0)
RBC: 5.5 Mil/uL (ref 4.22–5.81)
RDW: 28.1 % — ABNORMAL HIGH (ref 11.5–15.5)
WBC: 4.6 K/uL (ref 4.0–10.5)

## 2024-09-11 LAB — POCT INFLUENZA A/B
Influenza A, POC: NEGATIVE
Influenza B, POC: NEGATIVE

## 2024-09-11 LAB — POC COVID19 BINAXNOW: SARS Coronavirus 2 Ag: NEGATIVE

## 2024-09-11 MED ORDER — METHYLPREDNISOLONE 4 MG PO TBPK: ORAL_TABLET | ORAL | 0 refills | Status: DC

## 2024-09-11 MED ORDER — AMOXICILLIN-POT CLAVULANATE 875-125 MG PO TABS: 1.0000 | ORAL_TABLET | Freq: Two times a day (BID) | ORAL | 0 refills | Status: DC

## 2024-09-11 NOTE — Progress Notes (Signed)
   Subjective:    Patient ID: Gregory Cortez, male    DOB: 05/14/1956, 68 y.o.   MRN: [id number]  HPI Here for 3 days of fever, body aches, headache, sinus congestion, and coughing up yellow sputum.    Review of Systems  Constitutional:  Positive for fever.  HENT:  Positive for congestion, postnasal drip and sinus pressure. Negative for ear pain and sore throat.   Eyes: Negative.   Respiratory:  Positive for cough. Negative for shortness of breath and wheezing.   Neurological:  Positive for dizziness and headaches.       Objective:   Physical Exam Constitutional:      General: He is not in acute distress. HENT:     Right Ear: Tympanic membrane, ear canal and external ear normal.     Left Ear: Tympanic membrane, ear canal and external ear normal.     Nose: Nose normal.     Mouth/Throat:     Pharynx: Oropharynx is clear.  Eyes:     Conjunctiva/sclera: Conjunctivae normal.  Pulmonary:     Effort: Pulmonary effort is normal.     Breath sounds: Normal breath sounds.  Lymphadenopathy:     Cervical: No cervical adenopathy.  Neurological:     Mental Status: He is alert.           Assessment & Plan:  He has a sinusitis which is no doubt aggravating his vertigo. We will treat with 10 days of Augmentin  and a Medrol  dose pack. Garnette Olmsted, MD

## 2024-09-12 ENCOUNTER — Other Ambulatory Visit: Payer: Self-pay | Admitting: Family Medicine

## 2024-09-12 NOTE — Telephone Encounter (Signed)
 Copied from CRM [id number]. Topic: Clinical - Medication Refill >> Sep 12, 2024  4:30 PM Arm[place] J wrote: Medication: oxyCODONE -acetaminophen  (PERCOCET) 10-325 MG tablet  Has the patient contacted their pharmacy? Yes (Agent: If no, request that the patient contact the pharmacy for the refill. If patient does not wish to contact the pharmacy document the reason why and proceed with request.) (Agent: If yes, when and what did the pharmacy advise?) Pharmacy will not fill medication and advised patient to call primary care.  This is the patient's preferred pharmacy:  [facility] [address] [place] - [address] [place] [address] Phone: [phone] Fax: [phone]  Is this the correct pharmacy for this prescription? Yes If no, delete pharmacy and type the correct one.   Has the prescription been filled recently? No  Is the patient out of the medication? Yes  Has the patient been seen for an appointment in the last year OR does the patient have an upcoming appointment? Yes  Can we respond through MyChart? Yes  Agent: Please be advised that Rx refills may take up to 3 business days. We ask that you follow-up with your pharmacy.

## 2024-09-13 NOTE — Addendum Note (Signed)
 Addended by: JOHNNY SENIOR A on: 09/13/2024 12:36 PM   Modules accepted: Orders

## 2024-09-17 NOTE — Telephone Encounter (Signed)
 Pt has a PMV/UDS scheduled for 09/18/2024

## 2024-09-18 ENCOUNTER — Ambulatory Visit: Admitting: Family Medicine

## 2024-09-23 ENCOUNTER — Ambulatory Visit (INDEPENDENT_AMBULATORY_CARE_PROVIDER_SITE_OTHER): Admitting: Family Medicine

## 2024-09-23 ENCOUNTER — Encounter: Payer: Self-pay | Admitting: Family Medicine

## 2024-09-23 VITALS — BP 136/78 | HR 82 | Temp 98.6°F | Wt 350.0 lb

## 2024-09-23 DIAGNOSIS — M25562 Pain in left knee: Secondary | ICD-10-CM | POA: Diagnosis not present

## 2024-09-23 DIAGNOSIS — G8929 Other chronic pain: Secondary | ICD-10-CM | POA: Diagnosis not present

## 2024-09-23 DIAGNOSIS — M25561 Pain in right knee: Secondary | ICD-10-CM | POA: Diagnosis not present

## 2024-09-23 MED ORDER — OXYCODONE-ACETAMINOPHEN 10-325 MG PO TABS: 1.0000 | ORAL_TABLET | Freq: Four times a day (QID) | ORAL | 0 refills | Status: AC | PRN

## 2024-09-23 NOTE — Progress Notes (Signed)
   Subjective:    Patient ID: Gregory Cortez, male    DOB: 05-27-1956, 68 y.o.   MRN: [id number]  HPI Here for pain management. He is doing about the same. He has an appt with Dr. Melodi soon about his knees.    Review of Systems  Constitutional: Negative.   Musculoskeletal:  Positive for arthralgias.       Objective:   Physical Exam Constitutional:      Appearance: He is obese.     Comments: Walks with a limp   Neurological:     Mental Status: He is alert.           Assessment & Plan:  Pain management.  Indication for chronic opioid: knee pain Medication and dose: Percocet 10-325 # pills per month: 120 Last UDS date: 09-23-24 Opioid Treatment Agreement signed (Y/N): 01-28-20 Opioid Treatment Agreement last reviewed with patient:  09-23-24 [facility] reviewed this encounter (include red flags): Yes Meds were refilled.  Garnette Olmsted, MD

## 2024-09-23 NOTE — Addendum Note (Signed)
 Addended by: LADONNA INOCENTE SAILOR on: 09/23/2024 02:40 PM   Modules accepted: Orders

## 2024-09-24 ENCOUNTER — Inpatient Hospital Stay (HOSPITAL_BASED_OUTPATIENT_CLINIC_OR_DEPARTMENT_OTHER): Admitting: Medical Oncology

## 2024-09-24 ENCOUNTER — Inpatient Hospital Stay: Attending: Medical Oncology

## 2024-09-24 ENCOUNTER — Encounter: Payer: Self-pay | Admitting: Medical Oncology

## 2024-09-24 VITALS — BP 122/60 | HR 89 | Temp 98.3°F | Resp 20 | Ht 70.0 in | Wt 350.0 lb

## 2024-09-24 DIAGNOSIS — Z7901 Long term (current) use of anticoagulants: Secondary | ICD-10-CM

## 2024-09-24 DIAGNOSIS — D500 Iron deficiency anemia secondary to blood loss (chronic): Secondary | ICD-10-CM | POA: Insufficient documentation

## 2024-09-24 DIAGNOSIS — Z86711 Personal history of pulmonary embolism: Secondary | ICD-10-CM | POA: Insufficient documentation

## 2024-09-24 LAB — IRON AND IRON BINDING CAPACITY (CC-WL,HP ONLY)
Iron: 26 ug/dL — ABNORMAL LOW (ref 45–182)
Saturation Ratios: 6 % — ABNORMAL LOW (ref 17.9–39.5)
TIBC: 472 ug/dL — ABNORMAL HIGH (ref 250–450)
UIBC: 446 ug/dL

## 2024-09-24 LAB — CBC WITH DIFFERENTIAL (CANCER CENTER ONLY)
Abs Immature Granulocytes: 0.01 K/uL (ref 0.00–0.07)
Basophils Absolute: 0.1 K/uL (ref 0.0–0.1)
Basophils Relative: 2 %
Eosinophils Absolute: 0.2 K/uL (ref 0.0–0.5)
Eosinophils Relative: 4 %
HCT: 43 % (ref 39.0–52.0)
Hemoglobin: 12.2 g/dL — ABNORMAL LOW (ref 13.0–17.0)
Immature Granulocytes: 0 %
Lymphocytes Relative: 22 %
Lymphs Abs: 1.2 K/uL (ref 0.7–4.0)
MCH: 21 pg — ABNORMAL LOW (ref 26.0–34.0)
MCHC: 28.4 g/dL — ABNORMAL LOW (ref 30.0–36.0)
MCV: 73.9 fL — ABNORMAL LOW (ref 80.0–100.0)
Monocytes Absolute: 0.5 K/uL (ref 0.1–1.0)
Monocytes Relative: 9 %
Neutro Abs: 3.7 K/uL (ref 1.7–7.7)
Neutrophils Relative %: 63 %
Platelet Count: 341 K/uL (ref 150–400)
RBC: 5.82 MIL/uL — ABNORMAL HIGH (ref 4.22–5.81)
RDW: 30.2 % — ABNORMAL HIGH (ref 11.5–15.5)
WBC Count: 5.7 K/uL (ref 4.0–10.5)
nRBC: 0 % (ref 0.0–0.2)

## 2024-09-24 LAB — DRUG MONITOR, PANEL 1, W/CONF, URINE
Amphetamines: NEGATIVE ng/mL (ref <500)
Barbiturates: NEGATIVE ng/mL (ref <300)
Benzodiazepines: NEGATIVE ng/mL (ref <100)
Cocaine Metabolite: NEGATIVE ng/mL (ref <150)
Creatinine: 155.5 mg/dL (ref >=20.0)
Marijuana Metabolite: NEGATIVE ng/mL (ref <20)
Methadone Metabolite: NEGATIVE ng/mL (ref <100)
Opiates: NEGATIVE ng/mL (ref <100)
Oxidant: NEGATIVE ug/mL (ref <200)
Oxycodone: NEGATIVE ng/mL (ref <100)
Phencyclidine: NEGATIVE ng/mL (ref <25)
pH: 6.4 (ref 4.5–9.0)

## 2024-09-24 LAB — DM TEMPLATE

## 2024-09-24 LAB — FERRITIN: Ferritin: 31 ng/mL (ref 24–336)

## 2024-09-24 NOTE — Progress Notes (Unsigned)
 Hematology and Oncology   Gregory Cortez [id number] 30-Sep-1956 68 y.o. 09/24/2024  Past Medical History:  Diagnosis Date   Chest pain    Diabetes mellitus type 2 in obese 11/29/2016   ED (erectile dysfunction)    GERD (gastroesophageal reflux disease)    [facility]   Hx of colonoscopy    Low back pain    dr gaither, dr tess, dr bonner, herniated disc L4-5   OSA (obstructive sleep apnea)    dr corrie   PE (pulmonary embolism)    bilateral sep 2011 and again bilateral May 2012   Primary hypercoagulable state    no etiology found per Dr. Gatha    Pulmonary embolism [facility]) 02/22/2010   CT angio Dx   Testosterone  deficiency    dr chales, shots every 2 weeks   Thrombosis of arm    left arm 08/2010    Principle Diagnosis:  Iron  Deficiency Anemia Hypercoagable state History of PE Bilateral Sept 2011 and May 2012  Current Therapy:   IDA- Oral iron  Hypercoagable State- Xarelto  20 mg PO Daily     Interim History:  Gregory Cortez has been referred to our office for new iron  deficiency anemia. He is a former patient of ours; we see him for his history of anticoagulation state. He was last seen by our office on 01/29/2014.   He was found to have new anemia on 08/27/2024. At this time his Hgb was 9.9 with an MCV of 61.7. After starting oral iron  his labs at recheck were Hgb 10.5 and MCV of 65.8. He also had iron  studied performed which showed an iron  level of 35, TIBC of 470.4, ferritin 9.1, iron  saturation 7.4%. He states that his bleeding slowed and is mild and intermittent now.   He reports a longstanding history of occasional rectal bleeding. He has had multiple colonoscopy work ups that suggest internal hemorrhoid as the cause. His last colonoscopy was on 10/15/2019 with endoscopy. Unfortunately over the past month he has had much more bleeding rectally. He has taken oral iron  for the past month. His symptoms of dizziness, SOB, fatigue have improved since being on the oral iron . He has  seen his PCP for symptoms and they have ordered a brain MRI and carotid ultrasound testing which he has scheduled.   UC/Crohn's: No PPI use: Yes Hiatal Hernia: No known  NSAID use: No Blood in urine: No Difficulty swallowing: No Pica: No SOB: Yes- chronic Fatigue: Yes Prior blood transfusion: No Prior history of blood loss: No Liver disease: He has had occasionally mildly elevated LFTs Kidney Disease: Normal  Bariatric/Intestinal surgery: No Frequent Blood Donation: No Prior evaluation with hematology: No Prior bone marrow biopsy: No Oral iron : Current- tolerating it well Prior IV iron  infusions: No Family history Anemia: No known sickle cell or thalassemia  Yes- suspected clotting disorders.  Special diets: Carnivore diet starting within the last few weeks No use of GLP-1: Yes Wt Readings from Last 3 Encounters:  09/24/24 (!) 350 lb (158.8 kg)  09/23/24 (!) 350 lb (158.8 kg)  09/11/24 (!) 351 lb (159.2 kg)     Medications:   Current Outpatient Medications:    ALPRAZolam  (XANAX ) 1 MG tablet, TAKE 1 TABLET BY MOUTH THREE TIMES DAILY AS NEEDED FOR ANXIETY, Disp: 90 tablet, Rfl: 5   Cholecalciferol (VITAMIN D -3 PO), Take by mouth daily. With Vitamin K , Disp: , Rfl:    diazepam  (VALIUM ) 10 MG tablet, Take one tablet one hour before MRI scan, Disp: 6 tablet, Rfl:  0   diphenhydramine -acetaminophen  (TYLENOL  PM) 25-500 MG TABS, Take 2 tablets by mouth at bedtime., Disp: , Rfl:    folic acid  (FOLVITE ) 1 MG tablet, Take 1 tablet by mouth once daily, Disp: 90 tablet, Rfl: 3   HYDROcodone  bit-homatropine (HYCODAN) 5-1.5 MG/5ML syrup, Take 5 mLs by mouth every 4 (four) hours as needed., Disp: 240 mL, Rfl: 0   Iron , Ferrous Sulfate , 325 (65 Fe) MG TABS, Take 325 mg by mouth daily., Disp: 90 tablet, Rfl: 3   meclizine  (ANTIVERT ) 25 MG tablet, Take 1 tablet (25 mg total) by mouth every 4 (four) hours as needed for dizziness., Disp: 60 tablet, Rfl: 2   metFORMIN  (GLUCOPHAGE ) 1000 MG tablet,  TAKE 1 TABLET BY MOUTH TWICE DAILY WITH A MEAL, Disp: 180 tablet, Rfl: 0   methocarbamol  (ROBAXIN ) 500 MG tablet, Take 1 tablet (500 mg total) by mouth every 8 (eight) hours as needed for muscle spasms., Disp: 270 tablet, Rfl: 3   metoCLOPramide  (REGLAN ) 10 MG tablet, Take 1 tablet (10 mg total) by mouth 3 (three) times daily as needed for up to 10 days (hiccups)., Disp: 30 tablet, Rfl: 0   Multiple Vitamin (MULTIVITAMIN WITH MINERALS) TABS tablet, Take 1 tablet by mouth once a week. , Disp: , Rfl:    omeprazole  (PRILOSEC) 40 MG capsule, Take 1 capsule by mouth once daily, Disp: 90 capsule, Rfl: 3   omeprazole  (PRILOSEC) 40 MG capsule, Take 1 capsule (40 mg total) by mouth daily., Disp: 90 capsule, Rfl: 3   oxyCODONE -acetaminophen  (PERCOCET) 10-325 MG tablet, Take 1 tablet by mouth every 6 (six) hours as needed for pain., Disp: 120 tablet, Rfl: 0   oxyCODONE -acetaminophen  (PERCOCET) 10-325 MG tablet, Take 1 tablet by mouth every 6 (six) hours as needed for pain., Disp: 120 tablet, Rfl: 0   oxyCODONE -acetaminophen  (PERCOCET) 10-325 MG tablet, Take 1 tablet by mouth every 6 (six) hours as needed for pain., Disp: 120 tablet, Rfl: 0   SYRINGE-NEEDLE, DISP, 3 ML (B-D SYRINGE/NEEDLE 3CC/22GX1.5) 22G X 1-1/2 3 ML MISC, Inject 0.5 mL into the muscle each week., Disp: 8 each, Rfl: 3   tadalafil  (CIALIS ) 20 MG tablet, Take 1 tablet (20 mg total) by mouth every other day as needed for erectile dysfunction., Disp: 45 tablet, Rfl: 3   testosterone  cypionate (DEPOTESTOSTERONE CYPIONATE) 200 MG/ML injection, INJECT 1/2 (ONE-HALF) ML INTRAMUSCULARLY  TWICE A WEEK, Disp: 10 mL, Rfl: 1   tirzepatide  (MOUNJARO ) 12.5 MG/0.5ML Pen, Inject 12.5 mg into the skin once a week., Disp: 6 mL, Rfl: 5   XARELTO  20 MG TABS tablet, Take 1 tablet by mouth once daily, Disp: 90 tablet, Rfl: 0  Allergies:  Allergies  Allergen Reactions   Lisinopril  Cough    Past Medical History, Surgical history, Social history, and Family  History were reviewed and updated.  Review of Systems: Review of Systems  Constitutional:  Positive for fatigue.  HENT:  Negative.    Eyes: Negative.   Respiratory:  Positive for shortness of breath (chronic). Negative for chest tightness, cough and wheezing.   Cardiovascular:  Positive for palpitations (chronic). Negative for chest pain and leg swelling.  Gastrointestinal: Negative.   Endocrine: Negative.   Genitourinary: Negative.    Musculoskeletal: Negative.   Skin: Negative.   Neurological: Negative.   Hematological: Negative.   Psychiatric/Behavioral: Negative.       Physical Exam:  height is 5' 10 (1.778 m) and weight is 350 lb (158.8 kg) (abnormal). His oral temperature is 98.3 F (36.8 C). His  blood pressure is 122/60 and his pulse is 89. His respiration is 20 and oxygen saturation is 94%.   Physical Exam General: NAD Cardiovascular: regular rate and rhythm Pulmonary: clear ant fields Abdomen: soft, nontender, + bowel sounds GU: no suprapubic tenderness Extremities: no edema, no joint deformities Skin: no rashes Neurological: Weakness but otherwise nonfocal   Lab Results  Component Value Date   WBC 4.6 09/11/2024   HGB 10.5 (L) 09/11/2024   HCT 36.2 (L) 09/11/2024   MCV 65.8 Repeated and verified X2. (L) 09/11/2024   PLT 292.0 09/11/2024     Chemistry      Component Value Date/Time   NA 139 08/27/2024 1133   NA 138 08/15/2022 0000   K 4.2 08/27/2024 1133   CL 104 08/27/2024 1133   CO2 27 08/27/2024 1133   BUN 10 08/27/2024 1133   BUN 15 08/15/2022 0000   CREATININE 0.87 08/27/2024 1133   GLU 91 08/15/2022 0000      Component Value Date/Time   CALCIUM 9.6 08/27/2024 1133   ALKPHOS 60 08/27/2024 1133   AST 23 08/27/2024 1133   ALT 22 08/27/2024 1133   BILITOT 0.5 08/27/2024 1133     Encounter Diagnosis  Name Primary?   Iron  deficiency anemia due to chronic blood loss Yes   Assessment and Plan- Patient is a 68 y.o. male who was referred to us   for iron  deficiency anemia. We previously saw him for anticoagulation management. He is on Xarelto  20 mg daily.   Today he elects to repeat labs to see if the bleeding has worsened his anemia or if counts have continued to improve. If counts have continued to improve, I have suggested continuation of oral iron  as this is safer than IV iron . If IV iron  is needed we discussed how this is given along with common side effects and risks. If bleeding continued we may need to lower his Xarelto  dose. I have also suggested he consider tapering off of his testosterone  to lower his risks of side effects including thrombotic events.     Disposition: RTC IV Venofer 300 mg weekly x 3 RTC 1 months APP, labs    Lauraine Dais PA-C 10/7/20252:33 PM

## 2024-09-25 ENCOUNTER — Other Ambulatory Visit: Payer: Self-pay | Admitting: Medical Oncology

## 2024-09-25 ENCOUNTER — Encounter: Payer: Self-pay | Admitting: Hematology & Oncology

## 2024-09-25 DIAGNOSIS — D509 Iron deficiency anemia, unspecified: Secondary | ICD-10-CM | POA: Insufficient documentation

## 2024-09-25 DIAGNOSIS — D500 Iron deficiency anemia secondary to blood loss (chronic): Secondary | ICD-10-CM

## 2024-09-26 ENCOUNTER — Ambulatory Visit: Payer: Self-pay | Admitting: Medical Oncology

## 2024-09-26 DIAGNOSIS — M170 Bilateral primary osteoarthritis of knee: Secondary | ICD-10-CM | POA: Diagnosis not present

## 2024-09-30 ENCOUNTER — Inpatient Hospital Stay

## 2024-09-30 VITALS — BP 141/74 | HR 70 | Temp 98.2°F | Resp 20

## 2024-09-30 DIAGNOSIS — D500 Iron deficiency anemia secondary to blood loss (chronic): Secondary | ICD-10-CM

## 2024-09-30 MED ORDER — IRON SUCROSE 300 MG IVPB - SIMPLE MED
300.0000 mg | Freq: Once | Status: AC
  Administered 2024-09-30: 300 mg via INTRAVENOUS
  Filled 2024-09-30: qty 300

## 2024-09-30 MED ORDER — SODIUM CHLORIDE 0.9 % IV SOLN: INTRAVENOUS | Status: DC

## 2024-09-30 NOTE — Patient Instructions (Signed)

## 2024-10-01 ENCOUNTER — Ambulatory Visit (INDEPENDENT_AMBULATORY_CARE_PROVIDER_SITE_OTHER)

## 2024-10-01 ENCOUNTER — Ambulatory Visit (HOSPITAL_BASED_OUTPATIENT_CLINIC_OR_DEPARTMENT_OTHER)

## 2024-10-01 DIAGNOSIS — R42 Dizziness and giddiness: Secondary | ICD-10-CM

## 2024-10-01 DIAGNOSIS — R0602 Shortness of breath: Secondary | ICD-10-CM | POA: Diagnosis not present

## 2024-10-01 LAB — ECHOCARDIOGRAM COMPLETE
Area-P 1/2: 4.6 cm2
Calc EF: 50.6 %
S' Lateral: 2.88 cm
Single Plane A2C EF: 50.6 %
Single Plane A4C EF: 53.5 %

## 2024-10-07 ENCOUNTER — Inpatient Hospital Stay

## 2024-10-07 VITALS — BP 115/53 | HR 78 | Temp 98.6°F | Resp 22

## 2024-10-07 DIAGNOSIS — D500 Iron deficiency anemia secondary to blood loss (chronic): Secondary | ICD-10-CM | POA: Diagnosis not present

## 2024-10-07 DIAGNOSIS — Z86711 Personal history of pulmonary embolism: Secondary | ICD-10-CM | POA: Diagnosis not present

## 2024-10-07 DIAGNOSIS — Z7901 Long term (current) use of anticoagulants: Secondary | ICD-10-CM | POA: Diagnosis not present

## 2024-10-07 MED ORDER — IRON SUCROSE 300 MG IVPB - SIMPLE MED
300.0000 mg | Freq: Once | Status: AC
  Administered 2024-10-07: 300 mg via INTRAVENOUS
  Filled 2024-10-07: qty 300

## 2024-10-07 MED ORDER — SODIUM CHLORIDE 0.9 % IV SOLN: INTRAVENOUS | Status: DC

## 2024-10-07 NOTE — Patient Instructions (Signed)
 [facility] - A DEPT OF [facility]  Discharge Instructions: Thank you for choosing [facility] to provide your oncology and hematology care.   If you have a lab appointment with the Cancer Center, please go directly to the Cancer Center and check in at the registration area.  Wear comfortable clothing and clothing appropriate for easy access to any Portacath or PICC line.   We strive to give you quality time with your provider. You may need to reschedule your appointment if you arrive late (15 or more minutes).  Arriving late affects you and other patients whose appointments are after yours.  Also, if you miss three or more appointments without notifying the office, you may be dismissed from the clinic at the provider's discretion.      For prescription refill requests, have your pharmacy contact our office and allow 72 hours for refills to be completed.    Today you received the following  agents Venofer      To help prevent nausea and vomiting after your treatment, we encourage you to take your nausea medication as directed.  BELOW ARE SYMPTOMS THAT SHOULD BE REPORTED IMMEDIATELY: *FEVER GREATER THAN 100.4 F (38 C) OR HIGHER *CHILLS OR SWEATING *NAUSEA AND VOMITING THAT IS NOT CONTROLLED WITH YOUR NAUSEA MEDICATION *UNUSUAL SHORTNESS OF BREATH *UNUSUAL BRUISING OR BLEEDING *URINARY PROBLEMS (pain or burning when urinating, or frequent urination) *BOWEL PROBLEMS (unusual diarrhea, constipation, pain near the anus) TENDERNESS IN MOUTH AND THROAT WITH OR WITHOUT PRESENCE OF ULCERS (sore throat, sores in mouth, or a toothache) UNUSUAL RASH, SWELLING OR PAIN  UNUSUAL VAGINAL DISCHARGE OR ITCHING   Items with * indicate a potential emergency and should be followed up as soon as possible or go to the Emergency Department if any problems should occur.  Please show the CHEMOTHERAPY ALERT CARD or IMMUNOTHERAPY ALERT CARD at check-in to the  Emergency Department and triage nurse. Should you have questions after your visit or need to cancel or reschedule your appointment, please contact [facility] - A DEPT OF [facility]  [phone] and follow the prompts.  Office hours are 8:00 a.m. to 4:30 p.m. Monday - Friday. Please note that voicemails left after 4:00 p.m. may not be returned until the following business day.  We are closed weekends and major holidays. You have access to a nurse at all times for urgent questions. Please call the main number to the clinic [phone] and follow the prompts.  For any non-urgent questions, you may also contact your provider using MyChart. We now offer e-Visits for anyone 79 and older to request care online for non-urgent symptoms. For details visit [email or web address].   Also download the MyChart app! Go to the app store, search "MyChart", open the app, select , and log in with your MyChart username and password.

## 2024-10-07 NOTE — Progress Notes (Signed)
 Pt. arrived for 2nd iron  infusion. Voiced improvement with symptoms post first tmt. Tolerated tmt well and monitored x 30 mins post. D/c in stable condition.

## 2024-10-14 ENCOUNTER — Other Ambulatory Visit: Payer: Self-pay | Admitting: Family Medicine

## 2024-10-14 ENCOUNTER — Inpatient Hospital Stay

## 2024-10-14 VITALS — BP 159/78 | HR 65 | Temp 98.0°F | Resp 18

## 2024-10-14 DIAGNOSIS — Z7901 Long term (current) use of anticoagulants: Secondary | ICD-10-CM | POA: Diagnosis not present

## 2024-10-14 DIAGNOSIS — Z86711 Personal history of pulmonary embolism: Secondary | ICD-10-CM | POA: Diagnosis not present

## 2024-10-14 DIAGNOSIS — D500 Iron deficiency anemia secondary to blood loss (chronic): Secondary | ICD-10-CM | POA: Diagnosis not present

## 2024-10-14 MED ORDER — SODIUM CHLORIDE 0.9 % IV SOLN: INTRAVENOUS | Status: DC

## 2024-10-14 MED ORDER — IRON SUCROSE 300 MG IVPB - SIMPLE MED
300.0000 mg | Freq: Once | Status: AC
  Administered 2024-10-14: 300 mg via INTRAVENOUS
  Filled 2024-10-14: qty 300

## 2024-10-22 ENCOUNTER — Other Ambulatory Visit: Payer: Self-pay

## 2024-10-22 DIAGNOSIS — E669 Obesity, unspecified: Secondary | ICD-10-CM

## 2024-10-22 NOTE — Progress Notes (Signed)
   10/22/2024  Patient ID: Gregory Cortez, male   DOB: 15-Apr-1956, 68 y.o.   MRN: [id number]  Pharmacy Quality Measure Review  This patient is appearing on a report for being at risk of failing the Glycemic Status Assessment in Diabetes measure this calendar year.   Last documented A1c  6.1 on 08/27/24  Jon VEAR Lindau, PharmD Clinical Pharmacist [phone]

## 2024-10-28 ENCOUNTER — Inpatient Hospital Stay: Attending: Hematology & Oncology

## 2024-10-28 DIAGNOSIS — Z7901 Long term (current) use of anticoagulants: Secondary | ICD-10-CM | POA: Diagnosis not present

## 2024-10-28 DIAGNOSIS — E538 Deficiency of other specified B group vitamins: Secondary | ICD-10-CM | POA: Diagnosis not present

## 2024-10-28 DIAGNOSIS — Z86711 Personal history of pulmonary embolism: Secondary | ICD-10-CM | POA: Diagnosis not present

## 2024-10-28 DIAGNOSIS — D509 Iron deficiency anemia, unspecified: Secondary | ICD-10-CM | POA: Insufficient documentation

## 2024-10-28 DIAGNOSIS — D500 Iron deficiency anemia secondary to blood loss (chronic): Secondary | ICD-10-CM

## 2024-10-28 LAB — CMP (CANCER CENTER ONLY)
ALT: 36 U/L (ref 0–44)
AST: 28 U/L (ref 15–41)
Albumin: 4.4 g/dL (ref 3.5–5.0)
Alkaline Phosphatase: 64 U/L (ref 38–126)
Anion gap: 12 (ref 5–15)
BUN: 14 mg/dL (ref 8–23)
CO2: 25 mmol/L (ref 22–32)
Calcium: 9.4 mg/dL (ref 8.9–10.3)
Chloride: 107 mmol/L (ref 98–111)
Creatinine: 0.97 mg/dL (ref 0.61–1.24)
GFR, Estimated: >60 mL/min (ref >60)
Glucose, Bld: 107 mg/dL — ABNORMAL HIGH (ref 70–99)
Potassium: 4.6 mmol/L (ref 3.5–5.1)
Sodium: 144 mmol/L (ref 135–145)
Total Bilirubin: 0.5 mg/dL (ref 0.0–1.2)
Total Protein: 7.2 g/dL (ref 6.5–8.1)

## 2024-10-28 LAB — CBC WITH DIFFERENTIAL (CANCER CENTER ONLY)
Abs Immature Granulocytes: 0.01 K/uL (ref 0.00–0.07)
Basophils Absolute: 0.1 K/uL (ref 0.0–0.1)
Basophils Relative: 1 %
Eosinophils Absolute: 0.2 K/uL (ref 0.0–0.5)
Eosinophils Relative: 3 %
HCT: 50.3 % (ref 39.0–52.0)
Hemoglobin: 15.6 g/dL (ref 13.0–17.0)
Immature Granulocytes: 0 %
Lymphocytes Relative: 20 %
Lymphs Abs: 1.1 K/uL (ref 0.7–4.0)
MCH: 26 pg (ref 26.0–34.0)
MCHC: 31 g/dL (ref 30.0–36.0)
MCV: 83.8 fL (ref 80.0–100.0)
Monocytes Absolute: 0.5 K/uL (ref 0.1–1.0)
Monocytes Relative: 9 %
Neutro Abs: 3.6 K/uL (ref 1.7–7.7)
Neutrophils Relative %: 67 %
Platelet Count: 237 K/uL (ref 150–400)
RBC: 6 MIL/uL — ABNORMAL HIGH (ref 4.22–5.81)
RDW: 26.7 % — ABNORMAL HIGH (ref 11.5–15.5)
WBC Count: 5.5 K/uL (ref 4.0–10.5)
nRBC: 0 % (ref 0.0–0.2)

## 2024-10-28 LAB — RETIC PANEL
Immature Retic Fract: 26.1 % — ABNORMAL HIGH (ref 2.3–15.9)
RBC.: 5.95 MIL/uL — ABNORMAL HIGH (ref 4.22–5.81)
Retic Count, Absolute: 132.7 K/uL (ref 19.0–186.0)
Retic Ct Pct: 2.2 % (ref 0.4–3.1)
Reticulocyte Hemoglobin: 32.1 pg (ref >27.9)

## 2024-10-28 LAB — FERRITIN: Ferritin: 90 ng/mL (ref 24–336)

## 2024-10-28 LAB — IRON AND IRON BINDING CAPACITY (CC-WL,HP ONLY)
Iron: 57 ug/dL (ref 45–182)
Saturation Ratios: 14 % — ABNORMAL LOW (ref 17.9–39.5)
TIBC: 399 ug/dL (ref 250–450)
UIBC: 342 ug/dL

## 2024-10-29 ENCOUNTER — Encounter: Payer: Self-pay | Admitting: Medical Oncology

## 2024-10-29 ENCOUNTER — Inpatient Hospital Stay

## 2024-10-29 ENCOUNTER — Other Ambulatory Visit: Payer: Self-pay

## 2024-10-29 ENCOUNTER — Inpatient Hospital Stay: Admitting: Medical Oncology

## 2024-10-29 VITALS — BP 120/53 | HR 93 | Temp 98.5°F | Resp 17 | Ht 70.0 in | Wt 348.0 lb

## 2024-10-29 DIAGNOSIS — E619 Deficiency of nutrient element, unspecified: Secondary | ICD-10-CM

## 2024-10-29 DIAGNOSIS — D500 Iron deficiency anemia secondary to blood loss (chronic): Secondary | ICD-10-CM | POA: Diagnosis not present

## 2024-10-29 DIAGNOSIS — Z7901 Long term (current) use of anticoagulants: Secondary | ICD-10-CM | POA: Diagnosis not present

## 2024-10-29 DIAGNOSIS — D509 Iron deficiency anemia, unspecified: Secondary | ICD-10-CM | POA: Diagnosis not present

## 2024-10-29 DIAGNOSIS — Z86711 Personal history of pulmonary embolism: Secondary | ICD-10-CM | POA: Diagnosis not present

## 2024-10-29 DIAGNOSIS — E538 Deficiency of other specified B group vitamins: Secondary | ICD-10-CM | POA: Diagnosis not present

## 2024-10-29 NOTE — Progress Notes (Signed)
 Hematology and Oncology   Gregory Cortez [id number] 01-06-1956 68 y.o. 10/29/2024  Past Medical History:  Diagnosis Date   Chest pain    Diabetes mellitus type 2 in obese 11/29/2016   ED (erectile dysfunction)    GERD (gastroesophageal reflux disease)    [facility]   Hx of colonoscopy    Low back pain    dr gaither, dr tess, dr bonner, herniated disc L4-5   OSA (obstructive sleep apnea)    dr corrie   PE (pulmonary embolism)    bilateral sep 2011 and again bilateral May 2012   Primary hypercoagulable state    Gregory etiology found per Dr. Gatha    Pulmonary embolism [facility]) 02/22/2010   CT angio Dx   Testosterone  deficiency    dr chales, shots every 2 weeks   Thrombosis of arm    left arm 08/2010    Principle Diagnosis:  Iron  Deficiency Anemia Hypercoagable state History of PE Bilateral Sept 2011 and May 2012  Current Therapy:   IDA- Oral iron  Hypercoagable State- Xarelto  20 mg PO Daily     Hematological History:  Mr. Gregory Cortez has been referred to our office for new iron  deficiency anemia. He is a former patient of ours; we see him for his history of anticoagulation state. He was last seen by our office on 01/29/2014.   He was found to have new anemia on 08/27/2024. At this time his Hgb was 9.9 with an MCV of 61.7. After starting oral iron  his labs at recheck were Hgb 10.5 and MCV of 65.8. He also had iron  studied performed which showed an iron  level of 35, TIBC of 470.4, ferritin 9.1, iron  saturation 7.4%. He states that his bleeding slowed and is mild and intermittent now.   He reports a longstanding history of occasional rectal bleeding. He has had multiple colonoscopy work ups that suggest internal hemorrhoid as the cause. His last colonoscopy was on 10/15/2019 with endoscopy. Unfortunately over the past month he has had much more bleeding rectally. He has taken oral iron  for the past month. His symptoms of dizziness, SOB, fatigue have improved since being on the oral iron . He  has seen his PCP for symptoms and they have ordered a brain MRI and carotid ultrasound testing which he has scheduled.   UC/Crohn's: Gregory PPI use: Yes Hiatal Hernia: Gregory known  NSAID use: Gregory Blood in urine: Gregory Difficulty swallowing: Gregory Pica: Gregory SOB: Yes- chronic Fatigue: Yes Prior blood transfusion: Gregory Prior history of blood loss: Gregory Liver disease: He has had occasionally mildly elevated LFTs Kidney Disease: Normal  Bariatric/Intestinal surgery: Gregory Frequent Blood Donation: Gregory Prior evaluation with hematology: Gregory Prior bone marrow biopsy: Gregory Oral iron : Current- tolerating it well Prior IV iron  infusions: Gregory Family history Anemia: Gregory known sickle cell or thalassemia  Yes- suspected clotting disorders.  Special diets: Carnivore diet starting within the last few weeks Use of GLP-1: Yes  Interim History:  Today he states that he is doing well. He has finished 3 treatments of IV Venofer 300 mg. He tolerated them well.   Symptoms of fatigue and dizziness have greatly improved. Chronic SOB also improved.   There has been Gregory bleeding to his knowledge: denies epistaxis, gingivitis, hemoptysis, hematemesis, hematuria, melena, excessive bruising, blood donation.   Gregory chest pain, SOB.   Wt Readings from Last 3 Encounters:  09/24/24 (!) 350 lb (158.8 kg)  09/23/24 (!) 350 lb (158.8 kg)  09/11/24 (!) 351 lb (159.2 kg)  Medications:   Current Outpatient Medications:    ALPRAZolam  (XANAX ) 1 MG tablet, TAKE 1 TABLET BY MOUTH THREE TIMES DAILY AS NEEDED FOR ANXIETY, Disp: 90 tablet, Rfl: 5   Cholecalciferol (VITAMIN D -3 PO), Take by mouth daily. With Vitamin K , Disp: , Rfl:    diazepam  (VALIUM ) 10 MG tablet, Take Cortez tablet Cortez hour before MRI scan, Disp: 6 tablet, Rfl: 0   diphenhydramine -acetaminophen  (TYLENOL  PM) 25-500 MG TABS, Take 2 tablets by mouth at bedtime., Disp: , Rfl:    folic acid  (FOLVITE ) 1 MG tablet, Take 1 tablet by mouth once daily, Disp: 90 tablet, Rfl: 3    HYDROcodone  bit-homatropine (HYCODAN) 5-1.5 MG/5ML syrup, Take 5 mLs by mouth every 4 (four) hours as needed., Disp: 240 mL, Rfl: 0   Iron , Ferrous Sulfate , 325 (65 Fe) MG TABS, Take 325 mg by mouth daily., Disp: 90 tablet, Rfl: 3   meclizine  (ANTIVERT ) 25 MG tablet, Take 1 tablet (25 mg total) by mouth every 4 (four) hours as needed for dizziness., Disp: 60 tablet, Rfl: 2   metFORMIN  (GLUCOPHAGE ) 1000 MG tablet, TAKE 1 TABLET BY MOUTH TWICE DAILY WITH A MEAL, Disp: 180 tablet, Rfl: 1   methocarbamol  (ROBAXIN ) 500 MG tablet, Take 1 tablet (500 mg total) by mouth every 8 (eight) hours as needed for muscle spasms., Disp: 270 tablet, Rfl: 3   metoCLOPramide  (REGLAN ) 10 MG tablet, Take 1 tablet (10 mg total) by mouth 3 (three) times daily as needed for up to 10 days (hiccups)., Disp: 30 tablet, Rfl: 0   Multiple Vitamin (MULTIVITAMIN WITH MINERALS) TABS tablet, Take 1 tablet by mouth once a week. , Disp: , Rfl:    omeprazole  (PRILOSEC) 40 MG capsule, Take 1 capsule by mouth once daily, Disp: 90 capsule, Rfl: 3   omeprazole  (PRILOSEC) 40 MG capsule, Take 1 capsule (40 mg total) by mouth daily., Disp: 90 capsule, Rfl: 3   oxyCODONE -acetaminophen  (PERCOCET) 10-325 MG tablet, Take 1 tablet by mouth every 6 (six) hours as needed for pain., Disp: 120 tablet, Rfl: 0   oxyCODONE -acetaminophen  (PERCOCET) 10-325 MG tablet, Take 1 tablet by mouth every 6 (six) hours as needed for pain., Disp: 120 tablet, Rfl: 0   oxyCODONE -acetaminophen  (PERCOCET) 10-325 MG tablet, Take 1 tablet by mouth every 6 (six) hours as needed for pain., Disp: 120 tablet, Rfl: 0   SYRINGE-NEEDLE, DISP, 3 ML (B-D SYRINGE/NEEDLE 3CC/22GX1.5) 22G X 1-1/2 3 ML MISC, Inject 0.5 mL into the muscle each week., Disp: 8 each, Rfl: 3   tadalafil  (CIALIS ) 20 MG tablet, Take 1 tablet (20 mg total) by mouth every other day as needed for erectile dysfunction., Disp: 45 tablet, Rfl: 3   testosterone  cypionate (DEPOTESTOSTERONE CYPIONATE) 200 MG/ML  injection, INJECT 1/2 (Cortez-HALF) ML INTRAMUSCULARLY  TWICE A WEEK, Disp: 10 mL, Rfl: 1   tirzepatide  (MOUNJARO ) 12.5 MG/0.5ML Pen, Inject 12.5 mg into the skin once a week., Disp: 6 mL, Rfl: 5   XARELTO  20 MG TABS tablet, Take 1 tablet by mouth once daily, Disp: 90 tablet, Rfl: 0  Allergies:  Allergies  Allergen Reactions   Lisinopril  Cough    Past Medical History, Surgical history, Social history, and Family History were reviewed and updated.  Review of Systems: Review of Systems  Constitutional:  Negative for fatigue.  HENT:  Negative.    Eyes: Negative.   Respiratory:  Positive for shortness of breath (chronic). Negative for chest tightness, cough and wheezing.   Cardiovascular:  Negative for chest pain, leg swelling and palpitations (  chronic).  Gastrointestinal: Negative.   Endocrine: Negative.   Genitourinary: Negative.    Musculoskeletal: Negative.   Skin: Negative.   Neurological: Negative.   Hematological: Negative.   Psychiatric/Behavioral: Negative.       Physical Exam:  oral temperature is 98.5 F (36.9 C). His blood pressure is 120/53 (abnormal) and his pulse is 93. His respiration is 17 and oxygen saturation is 97%.   Physical Exam General: NAD Cardiovascular: regular rate and rhythm Pulmonary: clear ant fields Abdomen: soft, nontender, + bowel sounds GU: Gregory suprapubic tenderness Extremities: Gregory edema, Gregory joint deformities Skin: Gregory rashes Neurological: Weakness but otherwise nonfocal   Lab Results  Component Value Date   WBC 5.5 10/28/2024   HGB 15.6 10/28/2024   HCT 50.3 10/28/2024   MCV 83.8 10/28/2024   PLT 237 10/28/2024     Chemistry      Component Value Date/Time   NA 144 10/28/2024 1445   NA 138 08/15/2022 0000   K 4.6 10/28/2024 1445   CL 107 10/28/2024 1445   CO2 25 10/28/2024 1445   BUN 14 10/28/2024 1445   BUN 15 08/15/2022 0000   CREATININE 0.97 10/28/2024 1445   GLU 91 08/15/2022 0000      Component Value Date/Time   CALCIUM  9.4 10/28/2024 1445   ALKPHOS 64 10/28/2024 1445   AST 28 10/28/2024 1445   ALT 36 10/28/2024 1445   BILITOT 0.5 10/28/2024 1445     Gregory diagnosis found.  Assessment and Plan- Patient is a 68 y.o. male who was referred to us  for iron  deficiency anemia. We previously saw him for anticoagulation management. He is on Xarelto  20 mg daily. He also has a history of B12 deficiency.   Today his Hgb has risen from 12.2 to 15.6 with IV iron .  Iron  levels show that he has utilized his infusion well.   Disposition: RTC 2 months labs then 2-7 days later APP visit    Lauraine Dais PA-C 11/11/20252:41 PM

## 2024-10-30 ENCOUNTER — Other Ambulatory Visit: Payer: Self-pay | Admitting: Family Medicine

## 2024-10-30 MED ORDER — TIRZEPATIDE 12.5 MG/0.5ML ~~LOC~~ SOAJ: 12.5000 mg | SUBCUTANEOUS | 5 refills | Status: DC

## 2024-10-30 NOTE — Telephone Encounter (Signed)
 Copied from CRM [id number]. Topic: Clinical - Medication Refill >> Oct 30, 2024 10:18 AM Shereese L wrote: Medication: tirzepatide  (MOUNJARO ) 12.5 MG/0.5ML Pen  Has the patient contacted their pharmacy? Yes (Agent: If no, request that the patient contact the pharmacy for the refill. If patient does not wish to contact the pharmacy document the reason why and proceed with request.) (Agent: If yes, when and what did the pharmacy advise?)  This is the patient's preferred pharmacy:  [facility] [address], [place] - [address] [place] [address] Phone: [phone] Fax: [phone]   Is this the correct pharmacy for this prescription? Yes If no, delete pharmacy and type the correct one.   Has the prescription been filled recently? Yes  Is the patient out of the medication? Yes  Has the patient been seen for an appointment in the last year OR does the patient have an upcoming appointment? Yes  Can we respond through MyChart? Yes  Agent: Please be advised that Rx refills may take up to 3 business days. We ask that you follow-up with your pharmacy.

## 2024-11-04 ENCOUNTER — Other Ambulatory Visit

## 2024-11-19 ENCOUNTER — Telehealth: Payer: Self-pay | Admitting: *Deleted

## 2024-11-19 NOTE — Telephone Encounter (Signed)
 Copied from CRM [id number]. Topic: General - Other >> Nov 19, 2024 11:47 AM Dedra B wrote: Reason for CRM: Pt would like to speak with Dr. Mira nurse regarding an appt. He refused to provide more information.

## 2024-11-20 NOTE — Telephone Encounter (Signed)
 Spoke with pt/ spouse scheduled appointment for a new pt per Dr Johnny.

## 2024-11-28 ENCOUNTER — Other Ambulatory Visit (HOSPITAL_COMMUNITY): Payer: Self-pay

## 2024-11-28 ENCOUNTER — Telehealth: Payer: Self-pay

## 2024-11-28 NOTE — Telephone Encounter (Addendum)
 Pharmacy Patient Advocate Encounter   Received notification from Onbase that prior authorization for oxyCODONE -Acetaminophen  10-325MG  tablets  is required/requested.   Insurance verification completed.   The patient is insured through [employer]   Per test claim: PA required; PA submitted to above mentioned insurance via Latent Key/confirmation #/EOC [facility] Status is pending

## 2024-11-29 ENCOUNTER — Emergency Department (HOSPITAL_COMMUNITY)

## 2024-11-29 ENCOUNTER — Emergency Department (HOSPITAL_COMMUNITY)
Admission: EM | Admit: 2024-11-29 | Discharge: 2024-11-29 | Disposition: A | Discharge status: Home / Self Care | Source: Ambulatory Visit | Attending: Emergency Medicine | Admitting: Emergency Medicine

## 2024-11-29 ENCOUNTER — Ambulatory Visit: Payer: Self-pay

## 2024-11-29 ENCOUNTER — Telehealth: Payer: Self-pay | Admitting: Family Medicine

## 2024-11-29 DIAGNOSIS — K625 Hemorrhage of anus and rectum: Secondary | ICD-10-CM | POA: Diagnosis not present

## 2024-11-29 DIAGNOSIS — I517 Cardiomegaly: Secondary | ICD-10-CM | POA: Diagnosis not present

## 2024-11-29 DIAGNOSIS — K449 Diaphragmatic hernia without obstruction or gangrene: Secondary | ICD-10-CM | POA: Diagnosis not present

## 2024-11-29 DIAGNOSIS — I701 Atherosclerosis of renal artery: Secondary | ICD-10-CM | POA: Diagnosis not present

## 2024-11-29 DIAGNOSIS — R0789 Other chest pain: Secondary | ICD-10-CM | POA: Insufficient documentation

## 2024-11-29 DIAGNOSIS — K922 Gastrointestinal hemorrhage, unspecified: Secondary | ICD-10-CM | POA: Insufficient documentation

## 2024-11-29 DIAGNOSIS — Z7901 Long term (current) use of anticoagulants: Secondary | ICD-10-CM | POA: Diagnosis not present

## 2024-11-29 DIAGNOSIS — R079 Chest pain, unspecified: Secondary | ICD-10-CM | POA: Diagnosis not present

## 2024-11-29 DIAGNOSIS — K5730 Diverticulosis of large intestine without perforation or abscess without bleeding: Secondary | ICD-10-CM | POA: Diagnosis not present

## 2024-11-29 DIAGNOSIS — K429 Umbilical hernia without obstruction or gangrene: Secondary | ICD-10-CM | POA: Diagnosis not present

## 2024-11-29 LAB — COMPREHENSIVE METABOLIC PANEL WITH GFR
ALT: 41 U/L (ref 0–44)
AST: 40 U/L (ref 15–41)
Albumin: 4.1 g/dL (ref 3.5–5.0)
Alkaline Phosphatase: 81 U/L (ref 38–126)
Anion gap: 11 (ref 5–15)
BUN: 11 mg/dL (ref 8–23)
CO2: 26 mmol/L (ref 22–32)
Calcium: 9.5 mg/dL (ref 8.9–10.3)
Chloride: 104 mmol/L (ref 98–111)
Creatinine, Ser: 0.96 mg/dL (ref 0.61–1.24)
GFR, Estimated: >60 mL/min (ref >60)
Glucose, Bld: 99 mg/dL (ref 70–99)
Potassium: 4.4 mmol/L (ref 3.5–5.1)
Sodium: 141 mmol/L (ref 135–145)
Total Bilirubin: 0.3 mg/dL (ref 0.0–1.2)
Total Protein: 7.1 g/dL (ref 6.5–8.1)

## 2024-11-29 LAB — CBC
HCT: 49.2 % (ref 39.0–52.0)
Hemoglobin: 15.1 g/dL (ref 13.0–17.0)
MCH: 26.6 pg (ref 26.0–34.0)
MCHC: 30.7 g/dL (ref 30.0–36.0)
MCV: 86.8 fL (ref 80.0–100.0)
Platelets: 252 K/uL (ref 150–400)
RBC: 5.67 MIL/uL (ref 4.22–5.81)
RDW: 17.8 % — ABNORMAL HIGH (ref 11.5–15.5)
WBC: 6 K/uL (ref 4.0–10.5)
nRBC: 0 % (ref 0.0–0.2)

## 2024-11-29 LAB — TYPE AND SCREEN
ABO/RH(D): B POS
Antibody Screen: NEGATIVE

## 2024-11-29 LAB — TROPONIN T, HIGH SENSITIVITY: Troponin T High Sensitivity: <15 ng/L (ref 0–19)

## 2024-11-29 MED ORDER — IOHEXOL 350 MG/ML SOLN
100.0000 mL | Freq: Once | INTRAVENOUS | Status: AC | PRN
  Administered 2024-11-29: 100 mL via INTRAVENOUS

## 2024-11-29 NOTE — ED Triage Notes (Signed)
 Patient in today reporting ongoing rectal bleeding hx of same with chest pain and dizziness. Reports bright red blood and now dark red. Denies n/v/d.

## 2024-11-29 NOTE — Discharge Instructions (Signed)
 Follow-up with your gastroenterologist.

## 2024-11-29 NOTE — Telephone Encounter (Signed)
 Call from Triage pt refuse to go to the ER she stated pt have a Gi bleed.

## 2024-11-29 NOTE — Telephone Encounter (Signed)
 Pharmacy Patient Advocate Encounter  Received notification from [employer] that Prior Authorization for oxyCODONE -Acetaminophen  10-325MG  tablets  has been APPROVED from 11/29/2024 to 11/29/2025   PA #/Case ID/Reference #: [id number]  RjdzPi:[id number];Dujuld:Jeemnczi;Review Type:Prior Auth;Coverage Start Date:11/29/2024;Coverage End Date:11/29/2025; Authorization Expiration12/11/2025

## 2024-11-29 NOTE — ED Notes (Signed)
 Writer took call from lab. Writer informed a redraw for the type and screen was needed for the lab. Writer notified tech and she went into room to redraw.

## 2024-11-29 NOTE — Telephone Encounter (Signed)
 FYI Only or Action Required?: FYI only for provider: ED advised.  Patient was last seen in primary care on 09/23/2024 by Gregory Cortez LABOR, MD.  Called Nurse Triage reporting No chief complaint on file..  Symptoms began several weeks ago.  Interventions attempted: Nothing.  Symptoms are: rapidly worsening.  Triage Disposition: Go to ED Now (Notify PCP)  Patient/caregiver understands and will follow disposition?: Yes  Reason for Disposition  SEVERE rectal bleeding (e.g., large blood clots; constant or on and off bleeding)  Answer Assessment - Initial Assessment Questions Pt reports longstanding occassional rectal bleeding that is currently severe. Pt is on xarelto .   States stool is soft and is having diarrhea 4 times per day with a large amount of dark red blood x 2 1/2 weeks. Describes blood as flooding. Also reports lack of energy. Denies dizziness.  States he feels like I have a knot in his stomach and a large amount of flatulence.   Reports unremarkable colonoscopy at age 45.  Pt refused ED recommendation, states he is ok at the moment and may go after the holidays. This RN reinforced recommendation to go to the ED with rationales including risk to life. CAL notified by phone.  **This RN again reviewed rationale. Pt then agreed to go to the ED.  1. APPEARANCE of BLOOD: What color is it? Is it passed separately, on the surface of the stool, or mixed in with the stool?      Dark red 2. AMOUNT: How much blood was passed?      Described as flooding 3. FREQUENCY: How many times has blood been passed with the stools?      4+ times per day 4. ONSET: When was the blood first seen in the stools? (Days or weeks)      Chronic, but severe x 2 1/2 weeks 5. DIARRHEA: Is there also some diarrhea? If Yes, ask: How many diarrhea stools in the past 24 hours?      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 Gatha    Pulmonary embolism [facility]) 02/22/2010   CT angio Dx   Testosterone  deficiency    dr chales, shots every 2 weeks   Thrombosis of arm    left arm 08/2010     Copied from CRM #[id number]. Topic: Clinical - Red Word Triage >> Nov 29, 2024 12:51 PM Rea C wrote: Red Word that prompted transfer to Nurse Triage: Blood with going to the bathroom-stool, and abdominal pain

## 2024-11-29 NOTE — ED Provider Notes (Signed)
 [place] EMERGENCY DEPARTMENT AT [facility] Provider Note   CSN: [id number] Arrival date & time: 11/29/24  1546     Patient presents with: Rectal Bleeding and Chest Pain   Gregory Cortez is a 68 y.o. male.   68 year old male presents with dark stools with rectal bleeding times several weeks.  Patient states he has had voluminous diarrhea which has been brown thick and dark.  He has had some bright red blood at times as well.  He has had no fever or chills.  Some diffuse abdominal crampiness prior to this.  No urinary symptoms.  States that he has had this for several years after he has iron  infusions.  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  States that he has not been lightheaded or short of breath.  Did have some chest tightness which has been intermittent for several weeks but not associate with dizziness or diaphoresis.  His chest tightness waxes and wanes and nothing makes it better or worse.  No exertional component to it.  Called his doctor today and was told to come here for further evaluation.       Prior to Admission medications  Medication Sig Start Date End Date Taking? Authorizing Provider  ALPRAZolam  (XANAX ) 1 MG tablet TAKE 1 TABLET BY MOUTH THREE TIMES DAILY AS NEEDED FOR ANXIETY 08/02/24   Johnny Garnette LABOR, MD  Cholecalciferol (VITAMIN D -3 PO) Take by mouth daily. With Vitamin K     [provider]  diazepam  (VALIUM ) 10 MG tablet Take one tablet one hour before MRI scan 08/22/23   Johnny Garnette LABOR, MD  diphenhydramine -acetaminophen  (TYLENOL  PM) 25-500 MG TABS Take 2 tablets by mouth at bedtime.    [provider]  folic acid  (FOLVITE ) 1 MG tablet Take 1 tablet by mouth once daily 08/12/24   Johnny Garnette LABOR, MD  HYDROcodone  bit-homatropine (HYCODAN) 5-1.5 MG/5ML syrup Take 5 mLs by mouth every 4 (four) hours as needed. 01/10/24   Johnny Garnette LABOR, MD  Iron , Ferrous Sulfate , 325 (65 Fe) MG TABS Take 325 mg by mouth daily. 08/29/24   Johnny Garnette LABOR, MD   meclizine  (ANTIVERT ) 25 MG tablet Take 1 tablet (25 mg total) by mouth every 4 (four) hours as needed for dizziness. 08/27/24   Johnny Garnette LABOR, MD  metFORMIN  (GLUCOPHAGE ) 1000 MG tablet TAKE 1 TABLET BY MOUTH TWICE DAILY WITH A MEAL 10/14/24   Johnny Garnette LABOR, MD  methocarbamol  (ROBAXIN ) 500 MG tablet Take 1 tablet (500 mg total) by mouth every 8 (eight) hours as needed for muscle spasms. 07/03/24   Johnny Garnette LABOR, MD  metoCLOPramide  (REGLAN ) 10 MG tablet Take 1 tablet (10 mg total) by mouth 3 (three) times daily as needed for up to 10 days (hiccups). 05/27/23 09/24/24  Gretta Sayres R, PA-C  Multiple Vitamin (MULTIVITAMIN WITH MINERALS) TABS tablet Take 1 tablet by mouth once a week.     [provider]  omeprazole  (PRILOSEC) 40 MG capsule Take 1 capsule by mouth once daily 07/03/24   Johnny Garnette LABOR, MD  omeprazole  (PRILOSEC) 40 MG capsule Take 1 capsule (40 mg total) by mouth daily. 07/03/24   Johnny Garnette LABOR, MD  oxyCODONE -acetaminophen  (PERCOCET) 10-325 MG tablet Take 1 tablet by mouth every 6 (six) hours as needed for pain. 09/23/24   Johnny Garnette LABOR, MD  oxyCODONE -acetaminophen  (PERCOCET) 10-325 MG tablet Take 1 tablet by mouth every 6 (six) hours as needed for pain. 09/23/24   Johnny Garnette LABOR, MD  oxyCODONE -acetaminophen  (  PERCOCET) 10-325 MG tablet Take 1 tablet by mouth every 6 (six) hours as needed for pain. 09/23/24   Johnny Garnette LABOR, MD  SYRINGE-NEEDLE, DISP, 3 ML (B-D SYRINGE/NEEDLE 3CC/22GX1.5) 22G X 1-1/2 3 ML MISC Inject 0.5 mL into the muscle each week. 07/14/20   Johnny Garnette LABOR, MD  tadalafil  (CIALIS ) 20 MG tablet Take 1 tablet (20 mg total) by mouth every other day as needed for erectile dysfunction. 01/05/24   Johnny Garnette LABOR, MD  testosterone  cypionate (DEPOTESTOSTERONE CYPIONATE) 200 MG/ML injection INJECT 1/2 (ONE-HALF) ML INTRAMUSCULARLY  TWICE A WEEK 07/03/24   Johnny Garnette LABOR, MD  tirzepatide  (MOUNJARO ) 12.5 MG/0.5ML Pen Inject 12.5 mg into the skin once a week. 10/30/24   Johnny Garnette LABOR, MD  XARELTO  20 MG TABS tablet Take 1 tablet by mouth once daily 09/11/24   Johnny Garnette LABOR, MD    Allergies: Lisinopril     Review of Systems  All other systems reviewed and are negative.   Updated Vital Signs BP (!) 159/78 (BP Location: Left Arm)   Pulse 67   Temp 97.9 F (36.6 C) (Oral)   Resp 18   Physical Exam Vitals and nursing note reviewed. Exam conducted with a chaperone present.  Constitutional:      General: He is not in acute distress.    Appearance: Normal appearance. He is well-developed. He is not toxic-appearing.  HENT:     Head: Normocephalic and atraumatic.  Eyes:     General: Lids are normal.     Conjunctiva/sclera: Conjunctivae normal.     Pupils: Pupils are equal, round, and reactive to light.  Neck:     Thyroid : No thyroid  mass.     Trachea: No tracheal deviation.  Cardiovascular:     Rate and Rhythm: Normal rate and regular rhythm.     Heart sounds: Normal heart sounds. No murmur heard.    No gallop.  Pulmonary:     Effort: Pulmonary effort is normal. No respiratory distress.     Breath sounds: Normal breath sounds. No stridor. No decreased breath sounds, wheezing, rhonchi or rales.  Abdominal:     General: There is no distension.     Palpations: Abdomen is soft.     Tenderness: There is no abdominal tenderness. There is no rebound.  Genitourinary:    Comments: Brown stool noted on DRE Musculoskeletal:        General: No tenderness. Normal range of motion.     Cervical back: Normal range of motion and neck supple.  Skin:    General: Skin is warm and dry.     Findings: No abrasion or rash.  Neurological:     Mental Status: He is alert and oriented to person, place, and time. Mental status is at baseline.     GCS: GCS eye subscore is 4. GCS verbal subscore is 5. GCS motor subscore is 6.     Cranial Nerves: No cranial nerve deficit.     Sensory: No sensory deficit.     Motor: Motor function is intact.  Psychiatric:        Attention and  Perception: Attention normal.        Speech: Speech normal.        Behavior: Behavior normal.     (all labs ordered are listed, but only abnormal results are displayed) Labs Reviewed  CBC - Abnormal; Notable for the following components:      Result Value   RDW 17.8 (*)    All other  components within normal limits  COMPREHENSIVE METABOLIC PANEL WITH GFR  POC OCCULT BLOOD, ED  TYPE AND SCREEN  TROPONIN T, HIGH SENSITIVITY    EKG: EKG Interpretation Date/Time:  Friday November 29 2024 16:11:33 EST Ventricular Rate:  73 PR Interval:  145 QRS Duration:  148 QT Interval:  385 QTC Calculation: 425 R Axis:   -59  Text Interpretation: Sinus rhythm Multiple premature complexes, vent & supraven RBBB and LAFB No significant change since last tracing Confirmed by Dasie Faden ([id number]) on 11/29/2024 5:08:08 PM  Radiology: DG Chest 2 View Result Date: 11/29/2024 EXAM: 2 VIEW(S) XRAY OF THE CHEST 11/29/2024 04:19:26 PM COMPARISON: None available. CLINICAL HISTORY: chest pain FINDINGS: LUNGS AND PLEURA: No focal pulmonary opacity. No pleural effusion. No pneumothorax. HEART AND MEDIASTINUM: Normal mediastinal contours. Mild cardiomegaly. BONES AND SOFT TISSUES: No acute osseous abnormality. Thoracic degenerative changes. IMPRESSION: 1. No acute cardiopulmonary process. 2. Mild cardiomegaly. Electronically signed by: Greig Pique MD 11/29/2024 05:14 PM EST RP Workstation: [id number]     Procedures   Medications Ordered in the ED - No data to display                                  Medical Decision Making Amount and/or Complexity of Data Reviewed Labs: ordered. Radiology: ordered.  Risk Prescription drug management.  Hemoglobin 15.1.  Manger of his labs are stable.  He had a CT of his abdomen which did not have any acute findings.  He has had no bloody stools here.  Instructed to follow-up with his GI doctor      Final diagnoses:  None    ED Discharge Orders     None           Dasie Faden, MD 11/29/24 2244

## 2024-12-03 NOTE — Telephone Encounter (Signed)
 Pt was seen at the ED for this problem

## 2024-12-07 ENCOUNTER — Other Ambulatory Visit: Payer: Self-pay | Admitting: Family Medicine

## 2024-12-09 ENCOUNTER — Other Ambulatory Visit (HOSPITAL_COMMUNITY): Payer: Self-pay

## 2024-12-23 ENCOUNTER — Encounter: Payer: Self-pay | Admitting: Hematology & Oncology

## 2024-12-24 ENCOUNTER — Encounter: Payer: Self-pay | Admitting: Hematology & Oncology

## 2024-12-25 ENCOUNTER — Encounter: Payer: Self-pay | Admitting: Family Medicine

## 2024-12-25 ENCOUNTER — Ambulatory Visit (INDEPENDENT_AMBULATORY_CARE_PROVIDER_SITE_OTHER): Admitting: Family Medicine

## 2024-12-25 VITALS — BP 124/78 | HR 60 | Temp 98.8°F | Wt 354.0 lb

## 2024-12-25 DIAGNOSIS — K922 Gastrointestinal hemorrhage, unspecified: Secondary | ICD-10-CM | POA: Diagnosis not present

## 2024-12-25 NOTE — Progress Notes (Signed)
" ° °  Subjective:    Patient ID: Gregory Cortez, male    DOB: March 26, 1956, 69 y.o.   MRN: [id number]  HPI Here to follow up on an ED visit on 11-29-24 for several weeks of diarrhea and bloody stools. The stools have ranged from dark black to bright red in color. He denies any pain or fever or nausea. At the ED his labs were normal, including a Hgb of 15.1. They also ordered a CTA of the abdomen and pelvis, and no active bleeding was found. He has taken himself off Mounjaro  in case this is worsening the diarrhea. His last colonoscopy on 10-15-19 showed internal hemorrhoids but no polyps.    Review of Systems  Constitutional: Negative.   Respiratory: Negative.    Cardiovascular: Negative.   Gastrointestinal:  Positive for blood in stool and diarrhea. Negative for abdominal distention, abdominal pain, constipation, nausea, rectal pain and vomiting.  Genitourinary: Negative.        Objective:   Physical Exam Constitutional:      Appearance: Normal appearance.  Cardiovascular:     Rate and Rhythm: Normal rate and regular rhythm.     Pulses: Normal pulses.     Heart sounds: Normal heart sounds.  Pulmonary:     Effort: Pulmonary effort is normal.     Breath sounds: Normal breath sounds.  Abdominal:     General: Abdomen is flat. Bowel sounds are normal. There is no distension.     Palpations: Abdomen is soft. There is no mass.     Tenderness: There is no abdominal tenderness. There is no right CVA tenderness, left CVA tenderness, guarding or rebound.     Hernia: No hernia is present.  Neurological:     Mental Status: He is alert.           Assessment & Plan:  He is having diarrhea and lower GI bleeding. I agree with stopping the Mounjaro , at least for now. We will urgently refer him to GI to evaluate this further. I personally spent a total of 34 minutes in the care of the patient today including getting/reviewing separately obtained history, performing a medically appropriate  exam/evaluation, placing orders, and independently interpreting results.  Garnette Olmsted, MD   "

## 2024-12-27 ENCOUNTER — Telehealth: Payer: Self-pay | Admitting: Emergency Medicine

## 2024-12-27 ENCOUNTER — Other Ambulatory Visit (INDEPENDENT_AMBULATORY_CARE_PROVIDER_SITE_OTHER)

## 2024-12-27 ENCOUNTER — Encounter: Payer: Self-pay | Admitting: Gastroenterology

## 2024-12-27 ENCOUNTER — Ambulatory Visit (INDEPENDENT_AMBULATORY_CARE_PROVIDER_SITE_OTHER): Admitting: Gastroenterology

## 2024-12-27 ENCOUNTER — Ambulatory Visit (INDEPENDENT_AMBULATORY_CARE_PROVIDER_SITE_OTHER)
Admission: RE | Admit: 2024-12-27 | Discharge: 2024-12-27 | Disposition: A | Discharge status: Home / Self Care | Source: Ambulatory Visit | Attending: Gastroenterology | Admitting: Gastroenterology

## 2024-12-27 VITALS — BP 120/68 | HR 80 | Ht 70.0 in | Wt 355.0 lb

## 2024-12-27 DIAGNOSIS — R194 Change in bowel habit: Secondary | ICD-10-CM | POA: Diagnosis not present

## 2024-12-27 DIAGNOSIS — R197 Diarrhea, unspecified: Secondary | ICD-10-CM | POA: Diagnosis not present

## 2024-12-27 DIAGNOSIS — D500 Iron deficiency anemia secondary to blood loss (chronic): Secondary | ICD-10-CM

## 2024-12-27 DIAGNOSIS — Z7901 Long term (current) use of anticoagulants: Secondary | ICD-10-CM

## 2024-12-27 DIAGNOSIS — R140 Abdominal distension (gaseous): Secondary | ICD-10-CM

## 2024-12-27 DIAGNOSIS — K625 Hemorrhage of anus and rectum: Secondary | ICD-10-CM | POA: Diagnosis not present

## 2024-12-27 DIAGNOSIS — K648 Other hemorrhoids: Secondary | ICD-10-CM | POA: Diagnosis not present

## 2024-12-27 LAB — COMPREHENSIVE METABOLIC PANEL WITH GFR
ALT: 26 U/L (ref 3–53)
AST: 19 U/L (ref 5–37)
Albumin: 4.5 g/dL (ref 3.5–5.2)
Alkaline Phosphatase: 70 U/L (ref 39–117)
BUN: 14 mg/dL (ref 6–23)
CO2: 30 meq/L (ref 19–32)
Calcium: 9.5 mg/dL (ref 8.4–10.5)
Chloride: 102 meq/L (ref 96–112)
Creatinine, Ser: 0.77 mg/dL (ref 0.40–1.50)
GFR: 93.54 mL/min
Glucose, Bld: 99 mg/dL (ref 70–99)
Potassium: 3.9 meq/L (ref 3.5–5.1)
Sodium: 138 meq/L (ref 135–145)
Total Bilirubin: 0.5 mg/dL (ref 0.2–1.2)
Total Protein: 7.6 g/dL (ref 6.0–8.3)

## 2024-12-27 LAB — CBC WITH DIFFERENTIAL/PLATELET
Basophils Absolute: 0.1 K/uL (ref 0.0–0.1)
Basophils Relative: 1.3 % (ref 0.0–3.0)
Eosinophils Absolute: 0.2 K/uL (ref 0.0–0.7)
Eosinophils Relative: 4.6 % (ref 0.0–5.0)
HCT: 43.2 % (ref 39.0–52.0)
Hemoglobin: 13.6 g/dL (ref 13.0–17.0)
Lymphocytes Relative: 24.8 % (ref 12.0–46.0)
Lymphs Abs: 1.2 K/uL (ref 0.7–4.0)
MCHC: 31.6 g/dL (ref 30.0–36.0)
MCV: 82.9 fl (ref 78.0–100.0)
Monocytes Absolute: 0.5 K/uL (ref 0.1–1.0)
Monocytes Relative: 10.1 % (ref 3.0–12.0)
Neutro Abs: 2.9 K/uL (ref 1.4–7.7)
Neutrophils Relative %: 59.2 % (ref 43.0–77.0)
Platelets: 282 K/uL (ref 150.0–400.0)
RBC: 5.21 Mil/uL (ref 4.22–5.81)
RDW: 16.6 % — ABNORMAL HIGH (ref 11.5–15.5)
WBC: 5 K/uL (ref 4.0–10.5)

## 2024-12-27 MED ORDER — NA SULFATE-K SULFATE-MG SULF 17.5-3.13-1.6 GM/177ML PO SOLN: 1.0000 | Freq: Once | ORAL | 0 refills | Status: AC

## 2024-12-27 NOTE — Addendum Note (Signed)
 Addended by: VICCI QUIRK D on: 12/27/2024 04:39 PM   Modules accepted: Orders

## 2024-12-27 NOTE — Patient Instructions (Signed)
 You have been scheduled for a colonoscopy. Please follow written instructions given to you at your visit today.   If you use inhalers (even only as needed), please bring them with you on the day of your procedure.  DO NOT TAKE 7 DAYS PRIOR TO TEST- Trulicity (dulaglutide) Ozempic , Wegovy  (semaglutide ) Mounjaro , Zepbound  (tirzepatide ) Bydureon Bcise (exanatide extended release)  DO NOT TAKE 1 DAY PRIOR TO YOUR TEST Rybelsus  (semaglutide ) Adlyxin (lixisenatide) Victoza (liraglutide) Byetta (exanatide) ___________________________________________________________________________  Please go to the lab in the basement of our building to have lab work done as you leave today. Hit B for basement when you get on the elevator.  When the doors open the lab is on your left.  We will call you with the results. Thank you.  Your provider has requested that you have an abdominal x ray before leaving today. Please go to the basement floor to our Radiology department for the test.  We have sent the following medications to your pharmacy for you to pick up at your convenience: Suprep  _______________________________________________________  If your blood pressure at your visit was 140/90 or greater, please contact your primary care physician to follow up on this.  _______________________________________________________  If you are age 72 or older, your body mass index should be between 23-30. Your Body mass index is 50.94 kg/m. If this is out of the aforementioned range listed, please consider follow up with your Primary Care Provider.  If you are age 70 or younger, your body mass index should be between 19-25. Your Body mass index is 50.94 kg/m. If this is out of the aformentioned range listed, please consider follow up with your Primary Care Provider.   ________________________________________________________  The [facility] providers would like to encourage you to use MYCHART to communicate  with providers for non-urgent requests or questions.  Due to long hold times on the telephone, sending your provider a message by [facility] may be a faster and more efficient way to get a response.  Please allow 48 business hours for a response.  Please remember that this is for non-urgent requests.  _______________________________________________________  [facility] is using a team-based approach to care.  Your team is made up of your doctor and two to three APPS. Our APPS (Nurse Practitioners and Physician Assistants) work with your physician to ensure care continuity for you. They are fully qualified to address your health concerns and develop a treatment plan. They communicate directly with your gastroenterologist to care for you. Seeing the Advanced Practice Practitioners on your physician's team can help you by facilitating care more promptly, often allowing for earlier appointments, access to diagnostic testing, procedures, and other specialty referrals.   Due to recent changes in healthcare laws, you may see the results of your imaging and laboratory studies on MyChart before your provider has had a chance to review them.  We understand that in some cases there may be results that are confusing or concerning to you. Not all laboratory results come back in the same time frame and the provider may be waiting for multiple results in order to interpret others.  Please give us  48 hours in order for your provider to thoroughly review all the results before contacting the office for clarification of your results.

## 2024-12-27 NOTE — Progress Notes (Signed)
 "  Aurelio Mccamy [id number] [birth year]   Chief Complaint: Diarrhea, rectal bleeding  Referring Provider: Johnny Garnette LABOR, MD Primary GI MD: Sampson  HPI: Eren Ryser is a 69 y.o. male with past medical history of T2DM, GERD, OSA, PE on anticoagulation, appendectomy who presents today for a complaint of diarrhea and rectal bleeding.    Follows with oncology for iron  deficiency anemia.  Per note 10/29/2024, found to have new anemia 08/27/2024 with hemoglobin 9.9 and MCV of 61.7.  There was some improvement after starting oral iron .  Patient reported longstanding history of occasional rectal bleeding with multiple colonoscopy workups suggesting internal hemorrhoids as the cause.  Last colonoscopy 10/15/2019.  At visit with oncology endorsed much more bleeding rectally over the past month with symptoms of dizziness, shortness of breath, and fatigue having improved since being on oral iron .  More significant improvement in hemoglobin with IV iron .  Patient is on Xarelto  20 mg daily.  Also has history of B12 deficiency.  He was seen in the ED 11/29/2024 presenting with dark stools and rectal bleeding with some diffuse abdominal cramping as well.  Reported having had similar symptoms for several years after his iron  infusions.  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  Had some intermittent chest tightness.  Hemoglobin 15.1, labs stable, no acute findings on CT angio GI bleed and no bloody stools in the ED.  Advised to follow-up with GI.  Seen by PCP 12/25/2024.  Had been on Mounjaro , but took himself off of it due to suspicion that it was worsening his diarrhea.  Referred urgently to GI for further evaluation.   Discussed the use of AI scribe software for clinical note transcription with the patient, who gave verbal consent to proceed.  History of Present Illness Giannis Corpuz Marcey is a 69 year old male with chronic rectal bleeding and iron  deficiency anemia who presents for evaluation of persistent,  explosive diarrhea.  Rectal Bleeding: - Recurrent rectal bleeding since age 89, previously attributed to internal hemorrhoids - Bleeding occurs with every bowel movement, lasting about a week at a time, then subsiding for 3-4 days before recurring - Blood ranges from light to dark red, sometimes obscuring the stool, and is accompanied by blood clots - No melena - ED visit approximately one month ago for rectal bleeding and diarrhea; laboratory studies and CT scan did not show active bleeding - Upper endoscopy and multiple colonoscopies reportedly unremarkable except for internal hemorrhoids - Current use of Xarelto    Chronic Diarrhea and Fecal Incontinence: - Persistent, uncontrollable, explosive watery diarrhea for the past month, with episodes up to five to seven times daily - No control over bowel movements, with urgency and frequent leakage, especially when stretching or getting out of his truck - Stool is so watery it sounds like urinating when sitting on the toilet - Dietary changes, such as increased water intake or carnivore diet, exacerbate diarrhea; eating crap foods or bananas may temporarily reduce frequency - Significant bloating, excessive gas, and sensation of fullness even when not recently eating - Audible abdominal bubbling - Lack of rectal strength and control, with frequent urgency and leakage - Severe impact on quality of life, restricting activities to home and work due to difficulty with hygiene, especially with limited use of right arm in anticipation of shoulder replacement  Iron  Deficiency Anemia: - Received three weekly iron  infusions prior to onset of explosive diarrhea; no further iron  infusions since recent ER visit - Not taking oral iron  supplements - Mild dizziness  and lightheadedness in the last couple days, attributed to possible recurrence of anemia  Medication Changes: - Mounjaro  discontinued a couple of weeks ago due to concern for contribution to  symptoms  Associated Symptoms and Other Medical Issues: - No fever or chills - Able to eat normally with good appetite - Chronic chest pain and shortness of breath, unchanged - Severe bilateral knee pain and hernia  Quality of Life Impact: - Quality of life described as at zero right now due to bowel urgency and incontinence - Significant anxiety and disruption to daily activities   Previous GI Procedures/Imaging   CT angio GI bleed 11/29/2024 IMPRESSION: 1. No active GI bleeding at this time . 2. 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 3. Elsewhere, No occlusion or hemodynamically significant stenosis of the abdominal or pelvic arterial system. No abdominal aortic aneurysm. No aortic dissection. Small Amount of aortoiliac atherosclerosis . 4. Advanced degenerative changes of the spine. 5. Scattered colonic diverticulosis without evidence of diverticulitis . 6. Mild hepatic steatosis. 7. Small hiatal hernia.  Colonoscopy 10/15/2019 - Internal hemorrhoids.  - The examination was otherwise normal.  - No specimens collected. - Recall 10 years  Past Medical History:  Diagnosis Date   Chest pain    Diabetes mellitus type 2 in obese 11/29/2016   ED (erectile dysfunction)    GERD (gastroesophageal reflux disease)    [facility]   Hx of colonoscopy    Low back pain    dr gaither, dr tess, dr bonner, herniated disc L4-5   OSA (obstructive sleep apnea)    dr corrie   PE (pulmonary embolism)    bilateral sep 2011 and again bilateral May 2012   Primary hypercoagulable state    no etiology found per Dr. Gatha    Pulmonary embolism [facility]) 02/22/2010   CT angio Dx   Testosterone  deficiency    dr chales, shots every 2 weeks   Thrombosis of arm    left arm 08/2010    Past Surgical History:  Procedure Laterality Date   APPENDECTOMY      cardiac stress test x 2      COLONOSCOPY WITH PROPOFOL  N/A 10/15/2019   per Dr. Sheppard Fitz. internal hemorroids, no polyps, repeat in 10 yrs   esi     L4-5 dr gaither 03/2010   ESOPHAGOGASTRODUODENOSCOPY     x2 - normal except reflux   KNEE SURGERY     both   LAPAROSCOPIC APPENDECTOMY  09/13/2012   Procedure: APPENDECTOMY LAPAROSCOPIC;  Surgeon: Jina Nephew, MD;  Location: [facility];  Service: General;  Laterality: N/A;   ROTATOR CUFF REPAIR  08/20/2010   left dr jacklin complicated by PE   SHOULDER SURGERY     right and left    Current Outpatient Medications  Medication Sig Dispense Refill   ALPRAZolam  (XANAX ) 1 MG tablet TAKE 1 TABLET BY MOUTH THREE TIMES DAILY AS NEEDED FOR ANXIETY 90 tablet 5   Cholecalciferol (VITAMIN D -3 PO) Take by mouth daily. With Vitamin K      diazepam  (VALIUM ) 10 MG tablet Take one tablet one hour before MRI scan 6 tablet 0   diphenhydramine -acetaminophen  (TYLENOL  PM) 25-500 MG TABS Take 2 tablets by mouth at bedtime.     folic acid  (FOLVITE ) 1 MG tablet Take 1 tablet by mouth once daily 90 tablet 3   HYDROcodone  bit-homatropine (HYCODAN) 5-1.5 MG/5ML syrup Take 5 mLs by  mouth every 4 (four) hours as needed. 240 mL 0   Iron , Ferrous Sulfate , 325 (65 Fe) MG TABS Take 325 mg by mouth daily. 90 tablet 3   meclizine  (ANTIVERT ) 25 MG tablet Take 1 tablet (25 mg total) by mouth every 4 (four) hours as needed for dizziness. 60 tablet 2   metFORMIN  (GLUCOPHAGE ) 1000 MG tablet TAKE 1 TABLET BY MOUTH TWICE DAILY WITH A MEAL 180 tablet 1   methocarbamol  (ROBAXIN ) 500 MG tablet Take 1 tablet (500 mg total) by mouth every 8 (eight) hours as needed for muscle spasms. 270 tablet 3   Multiple Vitamin (MULTIVITAMIN WITH MINERALS) TABS tablet Take 1 tablet by mouth once a week.      Na Sulfate-K Sulfate-Mg Sulfate concentrate (SUPREP) 17.5-3.13-1.6 GM/177ML SOLN Take 1 kit (354 mLs total) by mouth once for 1 dose. 354 mL 0   omeprazole  (PRILOSEC) 40 MG capsule Take 1 capsule by mouth  once daily 90 capsule 3   omeprazole  (PRILOSEC) 40 MG capsule Take 1 capsule (40 mg total) by mouth daily. 90 capsule 3   oxyCODONE -acetaminophen  (PERCOCET) 10-325 MG tablet Take 1 tablet by mouth every 6 (six) hours as needed for pain. 120 tablet 0   oxyCODONE -acetaminophen  (PERCOCET) 10-325 MG tablet Take 1 tablet by mouth every 6 (six) hours as needed for pain. 120 tablet 0   oxyCODONE -acetaminophen  (PERCOCET) 10-325 MG tablet Take 1 tablet by mouth every 6 (six) hours as needed for pain. 120 tablet 0   SYRINGE-NEEDLE, DISP, 3 ML (B-D SYRINGE/NEEDLE 3CC/22GX1.5) 22G X 1-1/2 3 ML MISC Inject 0.5 mL into the muscle each week. 8 each 3   tadalafil  (CIALIS ) 20 MG tablet Take 1 tablet (20 mg total) by mouth every other day as needed for erectile dysfunction. 45 tablet 3   testosterone  cypionate (DEPOTESTOSTERONE CYPIONATE) 200 MG/ML injection INJECT 1/2 (ONE-HALF) ML INTRAMUSCULARLY  TWICE A WEEK 10 mL 1   XARELTO  20 MG TABS tablet Take 1 tablet by mouth once daily 90 tablet 3   No current facility-administered medications for this visit.    Allergies as of 12/27/2024 - Review Complete 12/27/2024  Allergen Reaction Noted   Lisinopril  Cough 07/07/2021    Family History  Problem Relation Age of Onset   Hypertension Other    Cancer Other        lung   Coronary artery disease Neg Hx     Social History[1]   Review of Systems:    Constitutional: No weight loss, fever, chills Cardiovascular: Intermittent chest pain at baseline Respiratory: Intermittent shortness of breath at baseline Gastrointestinal: See HPI and otherwise negative   Physical Exam:  Vital signs: BP 120/68   Pulse 80   Ht 5' 10 (1.778 m)   Wt (!) 355 lb (161 kg)   BMI 50.94 kg/m   Wt Readings from Last 3 Encounters:  12/27/24 (!) 355 lb (161 kg)  12/25/24 (!) 354 lb (160.6 kg)  10/29/24 (!) 348 lb (157.9 kg)     Constitutional: Pleasant, morbidly obese male in NAD, alert and cooperative Head:  Normocephalic  and atraumatic.  Respiratory: Respirations even and unlabored. Lungs clear to auscultation bilaterally.  No wheezes, crackles, or rhonchi.  Cardiovascular:  Regular rate and rhythm. No murmurs. No peripheral edema. Gastrointestinal:  Soft, nondistended, nontender. No rebound or guarding. Normal bowel sounds. No appreciable masses or hepatomegaly. Rectal:  Not performed.  Neurologic:  Alert and oriented x4;  grossly normal neurologically.  Skin:   Dry and intact without significant lesions  or rashes. Psychiatric: Oriented to person, place and time. Demonstrates good judgement and reason without abnormal affect or behaviors.   Echocardiogram 10/01/2024 1. Left ventricular ejection fraction, by estimation, is 50 to 55% . Left ventricular ejection fraction by 2D MOD biplane is 50. 6 % . The left ventricle has low normal function. The left ventricle has no regional wall motion abnormalities. There is mild left ventricular hypertrophy. Left ventricular diastolic parameters are consistent with Grade I diastolic dysfunction ( impaired relaxation) .  2. Right ventricular systolic function is low normal. The right ventricular size is normal. Tricuspid regurgitation signal is inadequate for assessing PA pressure.  3. The mitral valve is grossly normal. Trivial mitral valve regurgitation.  4. The aortic valve is tricuspid. Aortic valve regurgitation is not visualized.  5. Aortic dilatation noted. There is borderline dilatation of the ascending aorta, measuring 39 mm.  6. The inferior vena cava is normal in size with greater than 50% respiratory variability, suggesting right atrial pressure of 3 mmHg.   Assessment/Plan:   Assessment & Plan Chronic diarrhea with rectal bleeding and fecal incontinence Patient with several weeks of frequent watery diarrhea and decreased ability to control bowel movements with some intermittent fecal seepage/incontinence.  Persistent, severe symptoms impair quality of life.  Differential includes infectious diarrhea, inflammatory bowel disease, microscopic colitis, and overflow diarrhea. Further evaluation needed for etiology and bleeding source. Does have longstanding history of rectal bleeding with previous extensive workup and has been attributed to internal hemorrhoids in the past.  Last colonoscopy in 2020.  Has followed with oncology and they manage his ongoing iron  deficiency anemia.  Recently had acute drop in hemoglobin and received iron  infusions with improvement.  Last hemoglobin 11/29/2024 was normal.  Patient denies any prior history of diarrhea to this extent.  - Stool studies: C. difficile PCR, stool culture - Labs today: CBC, CMP - Ordered abdominal x-ray to evaluate stool burden and rule out overflow diarrhea. - Schedule hospital-based colonoscopy based on patient's BMI over 50. I thoroughly discussed the procedure with the patient to include nature of the procedure, alternatives, benefits, and risks (including but not limited to bleeding, infection, perforation, anesthesia/cardiac/pulmonary complications). Patient verbalized understanding and gave verbal consent to proceed with procedure.  - Request to hold Xarelto  for procedure - Instructed prompt return of stool samples. Will set reminder to follow up on this in advance of procedure. - Discussed BRAT diet modifications - Discussed loperamide use if infectious and structural causes excluded. - Recommended simethicone  for bloating, pending infection workup. - Request past GI records  Iron  deficiency anemia secondary to chronic gastrointestinal blood loss Iron  deficiency anemia with recent exacerbation requiring iron  infusions; ongoing rectal bleeding suggests continued blood loss and risk for recurrent anemia.  - Ordered repeat complete blood count to assess hemoglobin and hematocrit.  Internal hemorrhoids Longstanding internal hemorrhoids previously presumed bleeding source; current symptoms  require evaluation for additional or alternative bleeding sources.  - Scheduled colonoscopy to evaluate internal hemorrhoids and other potential bleeding sources. - Consider use of empiric Anusol suppositories   Patient assigned to Dr. Shila today based on procedure availability.   Camie Furbish, PA-C [facility] 12/27/2024, 4:28 PM  Patient Care Team: Johnny Garnette LABOR, MD as PCP - General Verlin Lonni BIRCH, MD (Cardiology) Clance, Francis HERO, MD as Consulting Physician (Pulmonary Disease) Timmy Maude SAUNDERS, MD as Consulting Physician (Internal Medicine)       [1]  Social History Tobacco Use   Smoking status: Never   Smokeless tobacco:  Never   Tobacco comments:    never used product  Vaping Use   Vaping status: Never Used  Substance Use Topics   Alcohol use: No    Alcohol/week: 0.0 standard drinks of alcohol   Drug use: No   "

## 2024-12-27 NOTE — Telephone Encounter (Signed)
" °  [facility] 07/13/1956 [id number]  12/27/2024   Dear Dr. Garnette [place]:  We have scheduled the above named patient for a(n) Colonoscopy procedure. Our records show that (s)he is on anticoagulation therapy.  Please advise as to whether the patient may come off their therapy of Xarelto  2 days prior to their procedure which is scheduled for 01/30/2025.  Please route your response to Glinda Louder, CMA or fax response to [phone].  Sincerely,    [facility]   "

## 2024-12-27 NOTE — Telephone Encounter (Signed)
 Yes he can hold the Xarelto  for 2 days prior to the procedure

## 2024-12-30 ENCOUNTER — Ambulatory Visit: Payer: Self-pay | Admitting: Gastroenterology

## 2024-12-30 ENCOUNTER — Other Ambulatory Visit

## 2024-12-30 DIAGNOSIS — K625 Hemorrhage of anus and rectum: Secondary | ICD-10-CM

## 2024-12-30 DIAGNOSIS — R197 Diarrhea, unspecified: Secondary | ICD-10-CM

## 2024-12-30 DIAGNOSIS — R194 Change in bowel habit: Secondary | ICD-10-CM

## 2024-12-30 DIAGNOSIS — Z7901 Long term (current) use of anticoagulants: Secondary | ICD-10-CM

## 2024-12-30 DIAGNOSIS — D500 Iron deficiency anemia secondary to blood loss (chronic): Secondary | ICD-10-CM

## 2024-12-30 DIAGNOSIS — K648 Other hemorrhoids: Secondary | ICD-10-CM

## 2024-12-30 DIAGNOSIS — R140 Abdominal distension (gaseous): Secondary | ICD-10-CM

## 2024-12-30 NOTE — Telephone Encounter (Signed)
 Called and spoke with patient. He was given instructions per Dr. Johnny that he can hold the Xarelto  for 2 days. He expressed understanding.

## 2024-12-31 LAB — CLOSTRIDIUM DIFFICILE TOXIN B, QUALITATIVE, REAL-TIME PCR: Toxigenic C. Difficile by PCR: NOT DETECTED

## 2025-01-03 LAB — STOOL CULTURE: E coli, Shiga toxin Assay: NEGATIVE

## 2025-01-06 ENCOUNTER — Inpatient Hospital Stay

## 2025-01-06 ENCOUNTER — Telehealth: Payer: Self-pay

## 2025-01-06 ENCOUNTER — Other Ambulatory Visit: Payer: Self-pay

## 2025-01-06 NOTE — Telephone Encounter (Signed)
 Patient is interested in trying the Anusol  suppositories.  [facility].

## 2025-01-07 ENCOUNTER — Inpatient Hospital Stay: Attending: Medical Oncology

## 2025-01-07 ENCOUNTER — Other Ambulatory Visit: Payer: Self-pay | Admitting: Emergency Medicine

## 2025-01-07 DIAGNOSIS — D509 Iron deficiency anemia, unspecified: Secondary | ICD-10-CM | POA: Insufficient documentation

## 2025-01-07 DIAGNOSIS — D500 Iron deficiency anemia secondary to blood loss (chronic): Secondary | ICD-10-CM

## 2025-01-07 DIAGNOSIS — E619 Deficiency of nutrient element, unspecified: Secondary | ICD-10-CM

## 2025-01-07 DIAGNOSIS — E538 Deficiency of other specified B group vitamins: Secondary | ICD-10-CM | POA: Insufficient documentation

## 2025-01-07 LAB — IRON AND IRON BINDING CAPACITY (CC-WL,HP ONLY)
Iron: 26 ug/dL — ABNORMAL LOW (ref 45–182)
Saturation Ratios: 6 % — ABNORMAL LOW (ref 17.9–39.5)
TIBC: 456 ug/dL — ABNORMAL HIGH (ref 250–450)
UIBC: 431 ug/dL

## 2025-01-07 LAB — RETIC PANEL
Immature Retic Fract: 34.2 % — ABNORMAL HIGH (ref 2.3–15.9)
RBC.: 4.95 MIL/uL (ref 4.22–5.81)
Retic Count, Absolute: 144 K/uL (ref 19.0–186.0)
Retic Ct Pct: 2.9 % (ref 0.4–3.1)
Reticulocyte Hemoglobin: 24.8 pg — ABNORMAL LOW

## 2025-01-07 LAB — FOLATE: Folate: >20 ng/mL

## 2025-01-07 LAB — VITAMIN B12: Vitamin B-12: 382 pg/mL (ref 180–914)

## 2025-01-07 LAB — FERRITIN: Ferritin: 12 ng/mL — ABNORMAL LOW (ref 24–336)

## 2025-01-07 MED ORDER — HYDROCORTISONE ACETATE 25 MG RE SUPP: 25.0000 mg | Freq: Every evening | RECTAL | 0 refills | Status: AC

## 2025-01-13 ENCOUNTER — Inpatient Hospital Stay: Admitting: Medical Oncology

## 2025-01-16 ENCOUNTER — Other Ambulatory Visit: Payer: Self-pay | Admitting: Family Medicine

## 2025-01-20 ENCOUNTER — Inpatient Hospital Stay

## 2025-01-20 ENCOUNTER — Inpatient Hospital Stay: Admitting: Medical Oncology

## 2025-01-20 ENCOUNTER — Encounter (HOSPITAL_COMMUNITY): Payer: Self-pay | Admitting: Gastroenterology

## 2025-01-22 ENCOUNTER — Telehealth: Payer: Self-pay

## 2025-01-22 ENCOUNTER — Telehealth: Payer: Self-pay | Admitting: Gastroenterology

## 2025-01-22 NOTE — Telephone Encounter (Addendum)
 Procedure:Colonoscopy Procedure date: 01/30/25 Procedure location: [facility] Arrival Time: 9:15 am Spoke with the patient Y/N:   No, I left a detailed message on [phone] on 01/22/25 @ 2:43 pm for the patient to return call  Yes 01/23/25 @ 2:17 pm   Any prep concerns? No Has the patient obtained the prep from the pharmacy ? Yes Do you have a care partner and transportation: Yes Any additional concerns? No

## 2025-01-22 NOTE — Telephone Encounter (Signed)
 Copied from CRM #[id number]. Topic: Clinical - Medication Question >> Jan 22, 2025 10:55 AM Rea C wrote: Reason for CRM: Pt would like to restart Mounjaro  5mg .    [facility] [address] [place], [place] - [address] [place] [address] Phone: [phone] Fax: [phone] Hours: Not open 24 hours   [phone] (M)

## 2025-01-23 ENCOUNTER — Other Ambulatory Visit: Payer: Self-pay | Admitting: Medical Genetics

## 2025-01-23 MED ORDER — TIRZEPATIDE 5 MG/0.5ML ~~LOC~~ SOAJ: 5.0000 mg | SUBCUTANEOUS | 5 refills | Status: AC

## 2025-01-23 NOTE — Telephone Encounter (Signed)
 Please advise.

## 2025-01-23 NOTE — Addendum Note (Signed)
 Addended by: JOHNNY SENIOR A on: 01/23/2025 09:23 AM   Modules accepted: Orders

## 2025-01-23 NOTE — Telephone Encounter (Signed)
 Done

## 2025-01-27 ENCOUNTER — Inpatient Hospital Stay

## 2025-01-27 ENCOUNTER — Inpatient Hospital Stay: Admitting: Medical Oncology

## 2025-01-30 ENCOUNTER — Encounter (HOSPITAL_COMMUNITY): Admission: RE | Payer: Self-pay | Source: Home / Self Care

## 2025-01-30 ENCOUNTER — Ambulatory Visit (HOSPITAL_COMMUNITY): Admission: RE | Admit: 2025-01-30 | Source: Home / Self Care | Admitting: Gastroenterology

## 2025-01-30 SURGERY — COLONOSCOPY
Anesthesia: Monitor Anesthesia Care
